# Patient Record
Sex: Male | Born: 1940 | Race: Black or African American | Hispanic: No | Smoking: Former smoker
Health system: Southern US, Community
[De-identification: ages and names within clinical notes are randomized; demographics above are authoritative.]

## PROBLEM LIST (undated history)

## (undated) ENCOUNTER — Emergency Department: Admission: EM | Disposition: A | Discharge status: Home / Self Care | Payer: Medicare PPO

## (undated) DIAGNOSIS — E669 Obesity, unspecified: Secondary | ICD-10-CM

## (undated) DIAGNOSIS — K4090 Unilateral inguinal hernia, without obstruction or gangrene, not specified as recurrent: Secondary | ICD-10-CM

## (undated) DIAGNOSIS — R6882 Decreased libido: Secondary | ICD-10-CM

## (undated) DIAGNOSIS — M545 Low back pain, unspecified: Secondary | ICD-10-CM

## (undated) DIAGNOSIS — K5900 Constipation, unspecified: Secondary | ICD-10-CM

## (undated) DIAGNOSIS — E785 Hyperlipidemia, unspecified: Secondary | ICD-10-CM

## (undated) DIAGNOSIS — N182 Chronic kidney disease, stage 2 (mild): Secondary | ICD-10-CM

## (undated) DIAGNOSIS — R011 Cardiac murmur, unspecified: Secondary | ICD-10-CM

## (undated) DIAGNOSIS — H409 Unspecified glaucoma: Secondary | ICD-10-CM

## (undated) DIAGNOSIS — I350 Nonrheumatic aortic (valve) stenosis: Secondary | ICD-10-CM

## (undated) DIAGNOSIS — K469 Unspecified abdominal hernia without obstruction or gangrene: Secondary | ICD-10-CM

## (undated) DIAGNOSIS — E119 Type 2 diabetes mellitus without complications: Secondary | ICD-10-CM

## (undated) DIAGNOSIS — I10 Essential (primary) hypertension: Secondary | ICD-10-CM

## (undated) DIAGNOSIS — M109 Gout, unspecified: Secondary | ICD-10-CM

## (undated) HISTORY — DX: Low back pain, unspecified: M54.50

## (undated) HISTORY — DX: Type 2 diabetes mellitus without complications: E11.9

## (undated) HISTORY — DX: Low back pain: M54.5

## (undated) HISTORY — DX: Chronic kidney disease, stage 2 (mild): N18.2

## (undated) HISTORY — DX: Nonrheumatic aortic (valve) stenosis: I35.0

## (undated) HISTORY — DX: Decreased libido: R68.82

## (undated) HISTORY — DX: Obesity, unspecified: E66.9

## (undated) HISTORY — DX: Unspecified glaucoma: H40.9

## (undated) HISTORY — PX: HERNIA REPAIR: SHX51

## (undated) HISTORY — DX: Cardiac murmur, unspecified: R01.1

## (undated) HISTORY — DX: Gout, unspecified: M10.9

## (undated) HISTORY — DX: Hyperlipidemia, unspecified: E78.5

## (undated) HISTORY — DX: Unilateral inguinal hernia, without obstruction or gangrene, not specified as recurrent: K40.90

## (undated) HISTORY — DX: Constipation, unspecified: K59.00

## (undated) HISTORY — PX: OTHER SURGICAL HISTORY: SHX169

## (undated) HISTORY — DX: Unspecified abdominal hernia without obstruction or gangrene: K46.9

## (undated) HISTORY — DX: Essential (primary) hypertension: I10

---

## 2006-10-20 ENCOUNTER — Ambulatory Visit: Payer: Self-pay | Admitting: Ophthalmology

## 2006-10-20 ENCOUNTER — Other Ambulatory Visit: Payer: Self-pay

## 2006-10-27 ENCOUNTER — Ambulatory Visit: Payer: Self-pay | Admitting: Ophthalmology

## 2007-03-18 DIAGNOSIS — M109 Gout, unspecified: Secondary | ICD-10-CM

## 2007-03-18 HISTORY — DX: Gout, unspecified: M10.9

## 2007-06-11 ENCOUNTER — Inpatient Hospital Stay: Payer: Self-pay | Admitting: General Surgery

## 2007-06-11 ENCOUNTER — Other Ambulatory Visit: Payer: Self-pay

## 2007-11-25 ENCOUNTER — Ambulatory Visit: Payer: Self-pay | Admitting: Family Medicine

## 2008-08-29 ENCOUNTER — Ambulatory Visit: Payer: Self-pay | Admitting: Urology

## 2008-11-07 ENCOUNTER — Ambulatory Visit: Payer: Self-pay | Admitting: Ophthalmology

## 2008-11-21 ENCOUNTER — Ambulatory Visit: Payer: Self-pay | Admitting: Ophthalmology

## 2009-03-17 HISTORY — PX: EYE SURGERY: SHX253

## 2009-03-17 HISTORY — PX: UMBILICAL HERNIA REPAIR: SHX196

## 2009-08-15 ENCOUNTER — Ambulatory Visit: Payer: Self-pay | Admitting: Family Medicine

## 2010-03-17 HISTORY — PX: COLONOSCOPY: SHX174

## 2010-10-11 ENCOUNTER — Emergency Department: Payer: Self-pay | Admitting: Emergency Medicine

## 2010-12-11 ENCOUNTER — Ambulatory Visit: Payer: Self-pay | Admitting: Gastroenterology

## 2010-12-12 ENCOUNTER — Ambulatory Visit: Payer: Self-pay | Admitting: Gastroenterology

## 2012-03-29 ENCOUNTER — Ambulatory Visit: Payer: Self-pay | Admitting: Family Medicine

## 2012-10-26 ENCOUNTER — Encounter: Payer: Self-pay | Admitting: General Surgery

## 2012-10-26 ENCOUNTER — Ambulatory Visit (INDEPENDENT_AMBULATORY_CARE_PROVIDER_SITE_OTHER): Payer: Medicare PPO | Admitting: General Surgery

## 2012-10-26 VITALS — BP 120/80 | HR 80 | Temp 97.0°F | Resp 16 | Ht 69.0 in | Wt 323.0 lb

## 2012-10-26 DIAGNOSIS — K432 Incisional hernia without obstruction or gangrene: Secondary | ICD-10-CM

## 2012-10-26 DIAGNOSIS — K5900 Constipation, unspecified: Secondary | ICD-10-CM | POA: Insufficient documentation

## 2012-10-26 DIAGNOSIS — K4090 Unilateral inguinal hernia, without obstruction or gangrene, not specified as recurrent: Secondary | ICD-10-CM | POA: Insufficient documentation

## 2012-10-26 NOTE — Patient Instructions (Addendum)
Patient instructed to continue use of Miralax on a daily basis. Patient to have CT scan of abdomin to follow up with Dr. Lemar Livings once this has been done.   Patient has been scheduled for a CT abdomen/pelvis with contrast at [facility] for 10-28-12 at 8 am (arrive 7:45 am). Prep: no solids 4 hours prior but patient may have clear liquids up until exam time, pick up prep kit, and take medication list. Patient verbalizes understanding. Patient will need to hold Janumet day of san and 48 hours after.

## 2012-10-26 NOTE — Progress Notes (Signed)
Patient ID: Jordan [name], male   DOB: [birth year], 72 y.o.   MRN: [id number]  Chief Complaint  Patient presents with  . Other    New Patient evaluation of hernia     HPI Jordan [name] is a 72 y.o. male who presents for an evaluation of a hernia. The patient states he has had problems with constipation for the last month to month and a half and has to use laxatives to make him self have a bowel movement. He states he uses laxatives approximately 2-3 times weekly. He also states he started vomiting this morning. He denies any fever or chills. He states he has had some abdominal pain.    HPI  Past Medical History  Diagnosis Date  . Hypertension   . Diabetes mellitus without complication   . Hyperlipidemia   . Hernia 5784,6962  . Gout 2009    Past Surgical History  Procedure Laterality Date  . Umbilical hernia repair  2011  . Eye surgery  2011    cataract  . Colonoscopy  2012    History reviewed. No pertinent family history.  Social History History  Substance Use Topics  . Smoking status: Former Smoker -- 20 years  . Smokeless tobacco: Not on file  . Alcohol Use: Yes    No Known Allergies  Current Outpatient Prescriptions  Medication Sig Dispense Refill  . allopurinol (ZYLOPRIM) 300 MG tablet Take 300 mg by mouth daily.      . bisacodyl (DULCOLAX) 5 MG EC tablet Take 5 mg by mouth.      . ezetimibe-simvastatin (VYTORIN) 10-40 MG per tablet Take 1 tablet by mouth at bedtime. 2-3 times weekly.      Marland Kitchen glipiZIDE (GLUCOTROL XL) 10 MG 24 hr tablet Take 10 mg by mouth 2 (two) times daily.      . indapamide (LOZOL) 1.25 MG tablet Take 1.25 mg by mouth daily.      . quinapril (ACCUPRIL) 40 MG tablet Take 40 mg by mouth daily.      . SitaGLIPtin-MetFORMIN HCl (JANUMET XR) [phone] MG TB24 Take 1 tablet by mouth daily.      . tadalafil (CIALIS) 5 MG tablet Take 5 mg by mouth daily as needed for erectile dysfunction.       No current facility-administered medications for  this visit.    Review of Systems Review of Systems  Constitutional: Negative.   Respiratory: Negative.   Cardiovascular: Negative.   Gastrointestinal: Positive for vomiting, abdominal pain and constipation.    Blood pressure 120/80, pulse 80, temperature 97 F (36.1 C), temperature source Oral, resp. rate 16, height 5\' 9"  (1.753 m), weight 323 lb (146.512 kg).  Physical Exam Physical Exam  Constitutional: He is oriented to person, place, and time. He appears well-developed and well-nourished.  Neck: Trachea normal. No thyromegaly present.  Cardiovascular: Normal rate and regular rhythm.   Murmur heard.  Systolic murmur is present with a grade of 2/6  Pulmonary/Chest: Effort normal and breath sounds normal.  Abdominal: Soft. Bowel sounds are normal. There is no tenderness. A hernia is present. Hernia confirmed positive in the ventral area.    Neurological: He is alert and oriented to person, place, and time.  Skin: Skin is warm and dry.    Data Reviewed None  Assessment    Constipation. Recurrent incisional hernia. This hernia is reducible and exam does not suggest any bowel obstruction.  Pt has had prior repairs of incisional hernia with mesh -at least twice.  Plan    Recommend continued use of Miralax. CT scan to assess the hernia and can follow up with Dr. Lemar Livings after scan.     Patient has been scheduled for a CT abdomen/pelvis with contrast at [facility] for 10-28-12 at 8 am (arrive 7:45 am). Prep: no solids 4 hours prior but patient may have clear liquids up until exam time, pick up prep kit, and take medication list. Patient verbalizes understanding. Patient will need to hold Janumet day of san and 48 hours after.    Jordan [name] G 10/26/2012, 1:23 PM

## 2012-10-28 ENCOUNTER — Ambulatory Visit: Payer: Self-pay | Admitting: General Surgery

## 2012-11-02 ENCOUNTER — Encounter: Payer: Self-pay | Admitting: General Surgery

## 2012-11-02 ENCOUNTER — Ambulatory Visit (INDEPENDENT_AMBULATORY_CARE_PROVIDER_SITE_OTHER): Payer: Medicare PPO | Admitting: General Surgery

## 2012-11-02 VITALS — BP 118/74 | HR 86 | Resp 18 | Ht 69.0 in | Wt 326.0 lb

## 2012-11-02 DIAGNOSIS — K432 Incisional hernia without obstruction or gangrene: Secondary | ICD-10-CM

## 2012-11-02 NOTE — Progress Notes (Signed)
Patient ID: Jordan [name], male   DOB: [birth year], 72 y.o.   MRN: [id number]  Chief Complaint  Patient presents with  . Follow-up    CT scan    HPI Jordan [name] is a 72 y.o. male.  Patient here today to review CT scan results. No further vomiting and constipation is better taking the Miralax daily. Wearing his abdominal binder. States he noticed the abdominal bulge about 5-6 months ago and hasn't changed in size. The patient is no longer experiencing any abdominal discomfort, nausea or vomiting. He reports he really easy elimination with the daily use of MiraLax. HPI  Past Medical History  Diagnosis Date  . Hypertension   . Diabetes mellitus without complication   . Hyperlipidemia   . Hernia 4098,1191  . Gout 2009    Past Surgical History  Procedure Laterality Date  . Umbilical hernia repair  2011  . Eye surgery  2011    cataract  . Colonoscopy  2012    No family history on file.  Social History History  Substance Use Topics  . Smoking status: Former Smoker -- 20 years  . Smokeless tobacco: Not on file  . Alcohol Use: Yes    No Known Allergies  Current Outpatient Prescriptions  Medication Sig Dispense Refill  . allopurinol (ZYLOPRIM) 300 MG tablet Take 300 mg by mouth daily.      . bisacodyl (DULCOLAX) 5 MG EC tablet Take 5 mg by mouth daily as needed.       . ezetimibe-simvastatin (VYTORIN) 10-40 MG per tablet Take 1 tablet by mouth at bedtime. 2-3 times weekly.      Marland Kitchen glipiZIDE (GLUCOTROL XL) 10 MG 24 hr tablet Take 10 mg by mouth 2 (two) times daily.      . indapamide (LOZOL) 1.25 MG tablet Take 1.25 mg by mouth daily.      . polyethylene glycol (MIRALAX / GLYCOLAX) packet Take 17 g by mouth daily.      . quinapril (ACCUPRIL) 40 MG tablet Take 40 mg by mouth daily.      . SitaGLIPtin-MetFORMIN HCl (JANUMET XR) [phone] MG TB24 Take 1 tablet by mouth daily.      . tadalafil (CIALIS) 5 MG tablet Take 5 mg by mouth daily as needed for erectile dysfunction.        No current facility-administered medications for this visit.    Review of Systems Review of Systems  Constitutional: Negative.   Respiratory: Negative.   Cardiovascular: Negative.     Blood pressure 118/74, pulse 86, resp. rate 18, height 5\' 9"  (1.753 m), weight 326 lb (147.873 kg).  Physical Exam Physical Exam  Constitutional: He is oriented to person, place, and time. He appears well-developed and well-nourished.  Cardiovascular: Normal rate and regular rhythm.   Pulmonary/Chest: Effort normal and breath sounds normal.  Abdominal: Soft. There is no tenderness. A hernia is present.  Lymphadenopathy:    He has no cervical adenopathy.  Neurological: He is alert and oriented to person, place, and time.  Skin: Skin is warm and dry.  5-6 cm defect in upper mid abdomen, partially reducible. Nontender. Normal bowel sounds.  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 A ventral hernia repair is identified. There was a suggestion of incarceration of one small bowel loop with slight thickening and surrounding inflammatory changes. Findings suggestive of constipation were seen.  Review of the patient's multiple previous surgical excursions was completed. The patient's first operative  intervention was in 1995 when he presented with an incarcerated ventral hernia. This was repaired primarily.  In 1999 the patient had a recurrent ventral hernia and placement of a large, oval Kugel patch and a preperitoneal pocket.  In 2002 the patient had a recurrence of his ventral hernia and a laparoscopic repair making use of an 18 x 24 cm Gore-Tex dual mesh was completed. The peritoneal sac was not resected at that time. Transvaginal fixation sutures were utilized.  In 2009 the patient presented with an incarcerated ventral hernia. He underwent an exploratory laparotomy, lysis of adhesions and placement of a 15 x 15 Proceed intraperitoneal mesh  was placed. This was anchored with through and through sutures of 0 Prolene. At that time the superior edge of the previously placed Gore-Tex mesh was found to have pulled free from its anchoring points and this was anchored to the proceed mesh with a transfacial sutures.  Assessment    Re\re recurrent ventral hernia, presently asymptomatic.  Morbid obesity.  Non-insulin-dependent diabetes mellitus.  Essential hypertension.     Plan    This patient has had 2 emergency operations for incarceration of his hernia. He has had 4 previous hernia repairs. I think the patient would be best managed by referral to a tertiary center for a component separation.  In spite of the CT suggesting involvement of the small bowel, the patient is presently asymptomatic, tolerating his diet well and having regular bowel movements. Urgent intervention is not necessary.        [name] 11/03/2012, 8:59 PM

## 2012-11-02 NOTE — Patient Instructions (Addendum)
Continue abdominal binder as needed for comfort Work on weight loss Ok to use miralax daily Call for increased pain, hernia enlarges or vomiting

## 2012-11-03 ENCOUNTER — Encounter: Payer: Self-pay | Admitting: General Surgery

## 2012-11-10 ENCOUNTER — Ambulatory Visit (INDEPENDENT_AMBULATORY_CARE_PROVIDER_SITE_OTHER): Payer: Medicare PPO | Admitting: General Surgery

## 2012-11-10 ENCOUNTER — Ambulatory Visit: Payer: Self-pay | Admitting: General Surgery

## 2012-11-10 ENCOUNTER — Encounter: Payer: Self-pay | Admitting: General Surgery

## 2012-11-10 VITALS — BP 138/70 | HR 68 | Resp 18 | Ht 69.0 in | Wt 324.0 lb

## 2012-11-10 DIAGNOSIS — K432 Incisional hernia without obstruction or gangrene: Secondary | ICD-10-CM

## 2012-11-10 DIAGNOSIS — R1110 Vomiting, unspecified: Secondary | ICD-10-CM

## 2012-11-10 MED ORDER — METOCLOPRAMIDE HCL 5 MG PO TABS: 5.0000 mg | ORAL_TABLET | Freq: Three times a day (TID) | ORAL | Status: DC

## 2012-11-10 NOTE — Progress Notes (Signed)
Patient ID: Jordan [name], male   DOB: [birth year], 72 y.o.   MRN: [id number]  No chief complaint on file.   HPI Jordan [name] is a 72 y.o. male.  Patient here today for abdominal evaluation.  States he has vomited for 2 days and been constipated since Sunday.  States he did have a BM today that was soft normal. Has been having hiccups since Sunday. He thinks it may be related to the hernia. Has been using miralax until he got sick.  The patient reports he said vomiting without nausea.He denies any abdominal pain. In particular, no tenderness around his hernia.  He has been able to eat some, reporting 3 hard boiled legs and it lasted milk today without any difficulty. He has been making use of his diabetic medications and blood pressure medications on his regular schedule. HPI  Past Medical History  Diagnosis Date  . Hypertension   . Diabetes mellitus without complication   . Hyperlipidemia   . Hernia 4098,1191  . Gout 2009    Past Surgical History  Procedure Laterality Date  . Umbilical hernia repair  2011  . Eye surgery  2011    cataract  . Colonoscopy  2012    No family history on file.  Social History History  Substance Use Topics  . Smoking status: Former Smoker -- 20 years  . Smokeless tobacco: Not on file  . Alcohol Use: Yes    No Known Allergies  Current Outpatient Prescriptions  Medication Sig Dispense Refill  . allopurinol (ZYLOPRIM) 300 MG tablet Take 300 mg by mouth daily.      . bisacodyl (DULCOLAX) 5 MG EC tablet Take 5 mg by mouth daily as needed.       . ezetimibe-simvastatin (VYTORIN) 10-40 MG per tablet Take 1 tablet by mouth at bedtime. 2-3 times weekly.      Marland Kitchen glipiZIDE (GLUCOTROL XL) 10 MG 24 hr tablet Take 10 mg by mouth 2 (two) times daily.      . indapamide (LOZOL) 1.25 MG tablet Take 1.25 mg by mouth daily.      . metoCLOPramide (REGLAN) 5 MG tablet Take 1 tablet (5 mg total) by mouth 4 (four) times daily -  with meals and at bedtime.   120 tablet  3  . polyethylene glycol (MIRALAX / GLYCOLAX) packet Take 17 g by mouth daily.      . quinapril (ACCUPRIL) 40 MG tablet Take 40 mg by mouth daily.      . SitaGLIPtin-MetFORMIN HCl (JANUMET XR) [phone] MG TB24 Take 1 tablet by mouth daily.      . tadalafil (CIALIS) 5 MG tablet Take 5 mg by mouth daily as needed for erectile dysfunction.       No current facility-administered medications for this visit.    Review of Systems Review of Systems  Respiratory: Negative.   Gastrointestinal: Positive for vomiting and constipation. Negative for abdominal pain.    Blood pressure 138/70, pulse 68, resp. rate 18, height 5\' 9"  (1.753 m), weight 324 lb (146.965 kg).  Physical Exam Physical Exam  Constitutional: He is oriented to person, place, and time. He appears well-developed and well-nourished.  Cardiovascular: Normal rate and regular rhythm.   Murmur heard. Murmur unchanged  Pulmonary/Chest: Effort normal and breath sounds normal.  Abdominal: Soft. Bowel sounds are normal. There is no tenderness. A hernia is present.  Neurological: He is alert and oriented to person, place, and time.  Skin: Skin is warm and dry.  The ventral hernia is unchanged from past exams. No tenderness. Totally reducible.  Data Reviewed Plain films of the abdomen today were obtained. On my review there is mild gastric air fluid level. No small bowel dilatation. Normal colonic gas pattern. No evidence of obstruction.  Assessment    Possible gastroparesis based on his long-standing history of diabetes. No evidence of obstruction at this time.     Plan    The patient was placed on Reglan 5 mg 30 minutes a.c. And q.h.s.  [facility].  If a timely appointment is available with Virgilio Frees, MD at [facility] in [place], this would be my first choice. If not, we'll contact [facility] for assessment. The patient has had 4 previous  hernia repairs, to complete it is emergency's. Considering his BMI and diabetes, it would be ideal for his fifth repair to be his final repair.     Patient to call the office with a progress report this Friday, 11-12-12.   [name] 11/10/2012, 10:03 PM

## 2012-11-10 NOTE — Patient Instructions (Addendum)
The patient is aware to call back for any questions or concerns.  Abdominal x ray today --no blockage from hernia  Reglan prescription -- Take four times a day Call back on Friday with progress report on how he thinks the medication is working

## 2012-11-11 ENCOUNTER — Telehealth: Payer: Self-pay | Admitting: *Deleted

## 2012-11-11 NOTE — Telephone Encounter (Signed)
Patient has been scheduled for an appointment with Dr. Tawanna Cooler Heniford at [facility] in [place] for 11-29-12 at 8:30 am (arrive 8:15 am). This patient has been notified of date, time, and instructions. The physician's information and directions have been mailed to the patient. Dr. Cresenciano Genre office will also be mailing patient new patient paperwork to complete.   Records will be forwarded to Attn: Carla at [phone] (Office Phone: [phone]).

## 2012-11-11 NOTE — Telephone Encounter (Signed)
Message copied by Nicholes Mango on Thu Nov 11, 2012  1:30 PM ------      Message from: [place], [place] W      Created: Wed Nov 10, 2012 10:07 PM       See if we can get a timely appt w/ Todd Henniford (associates)  at [facility] re: recurrent ventral hernia.  ------

## 2012-11-12 ENCOUNTER — Encounter: Payer: Self-pay | Admitting: General Surgery

## 2012-11-18 ENCOUNTER — Encounter: Payer: Self-pay | Admitting: General Surgery

## 2012-11-19 ENCOUNTER — Encounter: Payer: Self-pay | Admitting: General Surgery

## 2012-12-01 ENCOUNTER — Encounter: Payer: Self-pay | Admitting: *Deleted

## 2012-12-02 ENCOUNTER — Ambulatory Visit (INDEPENDENT_AMBULATORY_CARE_PROVIDER_SITE_OTHER): Payer: Medicare PPO | Admitting: Cardiovascular Disease

## 2012-12-02 ENCOUNTER — Encounter: Payer: Self-pay | Admitting: Cardiovascular Disease

## 2012-12-02 VITALS — BP 170/89 | HR 87 | Ht 69.0 in | Wt 321.2 lb

## 2012-12-02 DIAGNOSIS — Z01810 Encounter for preprocedural cardiovascular examination: Secondary | ICD-10-CM

## 2012-12-02 DIAGNOSIS — I10 Essential (primary) hypertension: Secondary | ICD-10-CM

## 2012-12-02 DIAGNOSIS — R011 Cardiac murmur, unspecified: Secondary | ICD-10-CM

## 2012-12-02 DIAGNOSIS — I350 Nonrheumatic aortic (valve) stenosis: Secondary | ICD-10-CM | POA: Insufficient documentation

## 2012-12-02 NOTE — Patient Instructions (Addendum)
Your physician has requested that you have an echocardiogram. Echocardiography is a painless test that uses sound waves to create images of your heart. It provides your doctor with information about the size and shape of your heart and how well your heart's chambers and valves are working. This procedure takes approximately one hour. There are no restrictions for this procedure.  Follow up in 6 months.  

## 2012-12-02 NOTE — Assessment & Plan Note (Signed)
He has a cardiac murmur suggestive of aortic stenosis which does not seem to be severe by physical exam. This will be evaluated with an echocardiogram.

## 2012-12-02 NOTE — Assessment & Plan Note (Signed)
The patient has no previous cardiac history except for diagnosis cardiac murmur. His functional capacity is good without any anginal symptoms. Baseline ECG is normal except for a few PVCs. Examination is not suggestive of severe aortic stenosis. Thus, most likely he will be considered an overall low risk for cardiovascular complications. I will obtain an echocardiogram before the planned surgery before giving final recommendations.

## 2012-12-02 NOTE — Assessment & Plan Note (Signed)
Blood pressure is elevated today but was normal recently when he saw Dr. Ignacia Felling. Continue to monitor.

## 2012-12-02 NOTE — Progress Notes (Signed)
Primary care physician: Dr. Thana Ates Surgeon: Dr. Marylene Land at [facility] in [place]  HPI  This is a pleasant 72 year old man who is referred by Dr. Thana Ates for preoperative cardiovascular evaluation before anticipated abdominal hernia surgery. The patient was noted to have a cardiac murmur. He is not aware of any previous cardiac history. His father had rheumatic heart disease. He has chronic medical conditions that include type 2 diabetes, hypertension, hyperlipidemia and obesity. He walks about 2 miles for exercise 5 times a week without any reported chest pain, dyspnea or palpitations. He is a previous smoker. He denies any dizziness, syncope or presyncope.   No Known Allergies   Current Outpatient Prescriptions on File Prior to Visit  Medication Sig Dispense Refill  . allopurinol (ZYLOPRIM) 300 MG tablet Take 300 mg by mouth daily.      . bisacodyl (DULCOLAX) 5 MG EC tablet Take 5 mg by mouth daily as needed.       . ezetimibe-simvastatin (VYTORIN) 10-40 MG per tablet Take 1 tablet by mouth at bedtime. 2-3 times weekly.      Marland Kitchen glipiZIDE (GLUCOTROL XL) 10 MG 24 hr tablet Take 10 mg by mouth 2 (two) times daily.      . indapamide (LOZOL) 1.25 MG tablet Take 1.25 mg by mouth daily.      . metoCLOPramide (REGLAN) 5 MG tablet Take 1 tablet (5 mg total) by mouth 4 (four) times daily -  with meals and at bedtime.  120 tablet  3  . polyethylene glycol (MIRALAX / GLYCOLAX) packet Take 17 g by mouth daily.      . quinapril (ACCUPRIL) 40 MG tablet Take 40 mg by mouth daily.      . SitaGLIPtin-MetFORMIN HCl (JANUMET XR) [phone] MG TB24 Take 1 tablet by mouth daily.      . tadalafil (CIALIS) 5 MG tablet Take 5 mg by mouth daily as needed for erectile dysfunction.       No current facility-administered medications on file prior to visit.     Past Medical History  Diagnosis Date  . Hypertension   . Diabetes mellitus without complication   . Hyperlipidemia   . Hernia 1610,9604  . Gout 2009  .  Obesity   . Decreased libido   . Lumbago   . Inguinal hernia without mention of obstruction or gangrene, unilateral or unspecified, (not specified as recurrent)   . Unspecified constipation   . Chronic kidney disease, stage II (mild)   . Heart murmur      Past Surgical History  Procedure Laterality Date  . Umbilical hernia repair  2011  . Eye surgery  2011    cataract  . Colonoscopy  2012  . Cataract surgery       Family History  Problem Relation Age of Onset  . Heart disease Father   . Hypertension Father      History   Social History  . Marital Status: Widowed    Spouse Name: N/A    Number of Children: N/A  . Years of Education: N/A   Occupational History  . Not on file.   Social History Main Topics  . Smoking status: Former Smoker -- 1.00 packs/day for 10 years    Types: Cigarettes  . Smokeless tobacco: Not on file  . Alcohol Use: Yes     Comment: social  . Drug Use: No  . Sexual Activity: Not on file   Other Topics Concern  . Not on file   Social History Narrative  .  No narrative on file     ROS A 10 point review of system was performed. It's negative other than as mentioned in the history of present illness.  PHYSICAL EXAM   BP 170/89  Pulse 87  Ht 5\' 9"  (1.753 m)  Wt 321 lb 4 oz (145.718 kg)  BMI 47.42 kg/m2 Constitutional: He is oriented to person, place, and time. He appears well-developed and well-nourished. No distress.  HENT: No nasal discharge.  Head: Normocephalic and atraumatic.  Eyes: Pupils are equal and round. Right eye exhibits no discharge. Left eye exhibits no discharge.  Neck: Normal range of motion. Neck supple. No JVD present. No thyromegaly present.  Cardiovascular: Normal rate, regular rhythm, normal heart sounds. Exam reveals no gallop and no friction rub. There is a 2/6 systolic crescendo decrescendo murmur which is mid peaking with preserved S2. The murmur does not change with Valsalva maneuver. Pulmonary/Chest:  Effort normal and breath sounds normal. No stridor. No respiratory distress. He has no wheezes. He has no rales. He exhibits no tenderness.  Abdominal: Soft. Bowel sounds are normal. He exhibits no distension. There is no tenderness. There is no rebound and no guarding.  Musculoskeletal: Normal range of motion. He exhibits no edema and no tenderness.  Neurological: He is alert and oriented to person, place, and time. Coordination normal.  Skin: Skin is warm and dry. No rash noted. He is not diaphoretic. No erythema. No pallor.  Psychiatric: He has a normal mood and affect. His behavior is normal. Judgment and thought content normal.       ZOX:WRUEA  Rhythm  - frequent ectopic ventricular beat s  # VECs = 2 BORDERLINE RHYTHM     ASSESSMENT AND PLAN

## 2012-12-07 ENCOUNTER — Other Ambulatory Visit: Payer: Self-pay

## 2012-12-07 ENCOUNTER — Other Ambulatory Visit (INDEPENDENT_AMBULATORY_CARE_PROVIDER_SITE_OTHER): Payer: Medicare PPO

## 2012-12-07 DIAGNOSIS — R011 Cardiac murmur, unspecified: Secondary | ICD-10-CM

## 2012-12-07 DIAGNOSIS — Z01810 Encounter for preprocedural cardiovascular examination: Secondary | ICD-10-CM

## 2013-02-02 ENCOUNTER — Ambulatory Visit: Payer: Medicare PPO | Admitting: General Surgery

## 2013-03-23 ENCOUNTER — Encounter: Payer: Self-pay | Admitting: *Deleted

## 2014-04-04 ENCOUNTER — Telehealth: Payer: Self-pay | Admitting: *Deleted

## 2014-04-04 NOTE — Telephone Encounter (Signed)
-----   Message from Robert Bellow, MD sent at 04/03/2014  8:16 AM EST ----- Patient underwent recurrent hernia repair at [facility] in January 2015 w/ Dr. Roxanne Mins.  Just check to see how he is doing and if he has had any more problems w/ nausea/ vomiting/ constipation.  Thanks.

## 2014-04-04 NOTE — Telephone Encounter (Signed)
I talked with the patient and he said he had surgery and the "knot" was gone. He also states the he seems to be "getting back to normal" as far as his symptoms. Appreciates your phone call and checking on him.  Aware to call for new concerns or issues.

## 2014-09-21 ENCOUNTER — Encounter: Payer: Self-pay | Admitting: Family Medicine

## 2014-09-21 ENCOUNTER — Ambulatory Visit (INDEPENDENT_AMBULATORY_CARE_PROVIDER_SITE_OTHER): Payer: Medicare PPO | Admitting: Family Medicine

## 2014-09-21 VITALS — BP 148/76 | HR 100 | Temp 98.2°F | Resp 18 | Ht 72.0 in | Wt 314.8 lb

## 2014-09-21 DIAGNOSIS — Z0000 Encounter for general adult medical examination without abnormal findings: Secondary | ICD-10-CM | POA: Diagnosis not present

## 2014-09-21 DIAGNOSIS — L129 Pemphigoid, unspecified: Secondary | ICD-10-CM | POA: Diagnosis not present

## 2014-09-21 DIAGNOSIS — Z125 Encounter for screening for malignant neoplasm of prostate: Secondary | ICD-10-CM | POA: Diagnosis not present

## 2014-09-21 DIAGNOSIS — N138 Other obstructive and reflux uropathy: Secondary | ICD-10-CM

## 2014-09-21 DIAGNOSIS — R040 Epistaxis: Secondary | ICD-10-CM | POA: Diagnosis not present

## 2014-09-21 DIAGNOSIS — N401 Enlarged prostate with lower urinary tract symptoms: Secondary | ICD-10-CM | POA: Diagnosis not present

## 2014-09-21 NOTE — Progress Notes (Signed)
Name: HAJIME ASFAW   MRN: [id number]    DOB: 03-20-1940   Date:09/21/2014       Progress Note  Subjective  Chief Complaint  Chief Complaint  Patient presents with  . Annual Exam    HPI  74 year old male presents for annual H&P a sign problems are stable  Past Medical History  Diagnosis Date  . Diabetes mellitus without complication   . Hyperlipidemia   . Hernia 1950,9326  . Gout 2009  . Obesity   . Decreased libido   . Lumbago   . Inguinal hernia without mention of obstruction or gangrene, unilateral or unspecified, (not specified as recurrent)   . Unspecified constipation   . Chronic kidney disease, stage II (mild)   . Heart murmur   . Hypertension     History  Substance Use Topics  . Smoking status: Former Smoker -- 1.00 packs/day for 10 years    Types: Cigarettes  . Smokeless tobacco: Not on file  . Alcohol Use: 0.0 oz/week    0 Standard drinks or equivalent per week     Comment: social     Current outpatient prescriptions:  .  allopurinol (ZYLOPRIM) 300 MG tablet, Take 300 mg by mouth daily., Disp: , Rfl:  .  aspirin 81 MG tablet, Take 162 mg by mouth daily., Disp: , Rfl:  .  bisacodyl (DULCOLAX) 5 MG EC tablet, Take 5 mg by mouth daily as needed. , Disp: , Rfl:  .  ezetimibe-simvastatin (VYTORIN) 10-40 MG per tablet, Take 1 tablet by mouth at bedtime. 2-3 times weekly., Disp: , Rfl:  .  glipiZIDE (GLUCOTROL XL) 10 MG 24 hr tablet, Take 10 mg by mouth 2 (two) times daily., Disp: , Rfl:  .  indapamide (LOZOL) 1.25 MG tablet, Take 1.25 mg by mouth daily., Disp: , Rfl:  .  metoCLOPramide (REGLAN) 5 MG tablet, Take 1 tablet (5 mg total) by mouth 4 (four) times daily -  with meals and at bedtime., Disp: 120 tablet, Rfl: 3 .  polyethylene glycol (MIRALAX / GLYCOLAX) packet, Take 17 g by mouth daily., Disp: , Rfl:  .  quinapril (ACCUPRIL) 40 MG tablet, Take 40 mg by mouth daily., Disp: , Rfl:  .  SitaGLIPtin-MetFORMIN HCl (JANUMET XR) [phone] MG TB24, Take 1  tablet by mouth daily., Disp: , Rfl:  .  tadalafil (CIALIS) 5 MG tablet, Take 5 mg by mouth daily as needed for erectile dysfunction., Disp: , Rfl:   No Known Allergies  Review of Systems  Constitutional: Negative for fever, chills and weight loss.  HENT: Negative for congestion, hearing loss, sore throat and tinnitus.   Eyes: Negative for blurred vision, double vision and redness.  Respiratory: Negative for cough, hemoptysis and shortness of breath.   Cardiovascular: Negative for chest pain, palpitations, orthopnea, claudication and leg swelling.  Gastrointestinal: Negative for heartburn, nausea, vomiting, diarrhea, constipation and blood in stool.  Genitourinary: Negative for dysuria, urgency, frequency and hematuria.  Musculoskeletal: Negative for myalgias, back pain, joint pain, falls and neck pain.  Skin: Negative for itching.  Neurological: Negative for dizziness, tingling, tremors, focal weakness, seizures, loss of consciousness, weakness and headaches.  Endo/Heme/Allergies: Does not bruise/bleed easily.  Psychiatric/Behavioral: Negative for depression and substance abuse. The patient is not nervous/anxious and does not have insomnia.      Objective  Filed Vitals:   09/21/14 0953  BP: 148/76  Pulse: 100  Temp: 98.2 F (36.8 C)  TempSrc: Oral  Resp: 18  Height: 6' (1.829 m)  Weight: 314 lb 12.8 oz (142.792 kg)  SpO2: 96%     Physical Exam  Constitutional: He is oriented to person, place, and time and well-developed, well-nourished, and in no distress.  Morbidly obese  HENT:  Head: Normocephalic.  Eyes: EOM are normal. Pupils are equal, round, and reactive to light.  Neck: Normal range of motion. Neck supple. No thyromegaly present.  Cardiovascular: Normal rate, regular rhythm and normal heart sounds.   No murmur heard. Pulmonary/Chest: Effort normal and breath sounds normal. No respiratory distress. He has no wheezes.  Abdominal: Soft. Bowel sounds are normal.   Ventral hernia with prior repair  Musculoskeletal: Normal range of motion. He exhibits no edema.  Lymphadenopathy:    He has no cervical adenopathy.  Neurological: He is alert and oriented to person, place, and time. No cranial nerve deficit. Gait normal. Coordination normal.  Skin: Skin is warm and dry. No rash noted.  Psychiatric: Affect and judgment normal.      Assessment & Plan  1. Annual physical exam   2. Epistaxis IMPROVING  3. Pemphigoid  - Ambulatory referral to Dermatology  4. Prostate cancer screening  - PSA

## 2014-09-21 NOTE — Progress Notes (Signed)
Name: Jordan [name]   MRN: [id number]    DOB: [birth year]   Date:09/21/2014       Progress Note  Subjective  Chief Complaint  Chief Complaint  Patient presents with  . Annual Exam    HPI   74 year old male presented for annual H&P. He has a number of other issues as noted below.   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 There was no antecedent trauma. There's been no fever or chills myalgias associated.    epistaxis    patient started recurrent bleeding of the left nostril. No history of any easy bruising or hematuria or melena or hematochezia.     Past Medical History  Diagnosis Date  . Diabetes mellitus without complication   . Hyperlipidemia   . Hernia 6578,4696  . Gout 2009  . Obesity   . Decreased libido   . Lumbago   . Inguinal hernia without mention of obstruction or gangrene, unilateral or unspecified, (not specified as recurrent)   . Unspecified constipation   . Chronic kidney disease, stage II (mild)   . Heart murmur   . Hypertension     History  Substance Use Topics  . Smoking status: Former Smoker -- 1.00 packs/day for 10 years    Types: Cigarettes  . Smokeless tobacco: Not on file  . Alcohol Use: 0.0 oz/week    0 Standard drinks or equivalent per week     Comment: social     Current outpatient prescriptions:  .  allopurinol (ZYLOPRIM) 300 MG tablet, Take 300 mg by mouth daily., Disp: , Rfl:  .  aspirin 81 MG tablet, Take 162 mg by mouth daily., Disp: , Rfl:  .  bisacodyl (DULCOLAX) 5 MG EC tablet, Take 5 mg by mouth daily as needed. , Disp: , Rfl:  .  ezetimibe-simvastatin (VYTORIN) 10-40 MG per tablet, Take 1 tablet by mouth at bedtime. 2-3 times weekly., Disp: , Rfl:  .  glipiZIDE (GLUCOTROL XL) 10 MG 24 hr tablet, Take 10 mg by mouth 2 (two) times daily., Disp: , Rfl:  .  indapamide (LOZOL) 1.25 MG tablet, Take 1.25 mg by mouth daily.,  Disp: , Rfl:  .  metoCLOPramide (REGLAN) 5 MG tablet, Take 1 tablet (5 mg total) by mouth 4 (four) times daily -  with meals and at bedtime., Disp: 120 tablet, Rfl: 3 .  polyethylene glycol (MIRALAX / GLYCOLAX) packet, Take 17 g by mouth daily., Disp: , Rfl:  .  quinapril (ACCUPRIL) 40 MG tablet, Take 40 mg by mouth daily., Disp: , Rfl:  .  SitaGLIPtin-MetFORMIN HCl (JANUMET XR) [phone] MG TB24, Take 1 tablet by mouth daily., Disp: , Rfl:  .  tadalafil (CIALIS) 5 MG tablet, Take 5 mg by mouth daily as needed for erectile dysfunction., Disp: , Rfl:   No Known Allergies  Review of Systems  Constitutional: Negative for fever, chills and weight loss.  HENT: Negative for congestion, hearing loss, sore throat and tinnitus.        Epistaxis on the left  Eyes: Negative for blurred vision, double vision and redness.  Respiratory: Negative for cough, hemoptysis and shortness of breath.   Cardiovascular: Negative for chest pain, palpitations, orthopnea, claudication and leg swelling.  Gastrointestinal: Negative for heartburn, nausea, vomiting, diarrhea, constipation and blood in stool.  Genitourinary: Negative for dysuria, urgency, frequency and hematuria.  Musculoskeletal: Negative for myalgias, back pain, joint  pain, falls and neck pain.  Skin: Positive for rash. Negative for itching.  Neurological: Negative for dizziness, tingling, tremors, focal weakness, seizures, loss of consciousness, weakness and headaches.  Endo/Heme/Allergies: Does not bruise/bleed easily.  Psychiatric/Behavioral: Negative for depression and substance abuse. The patient is not nervous/anxious and does not have insomnia.      Objective  Filed Vitals:   09/21/14 0953  BP: 148/76  Pulse: 100  Temp: 98.2 F (36.8 C)  TempSrc: Oral  Resp: 18  Height: 6' (1.829 m)  Weight: 314 lb 12.8 oz (142.792 kg)  SpO2: 96%     Physical Exam  Constitutional: He is oriented to person, place, and time and well-developed,  well-nourished, and in no distress.  Morbidly obese  HENT:  Head: Normocephalic.  There is a small area of bleeding on the septal aspect of the left nostril  Eyes: EOM are normal. Pupils are equal, round, and reactive to light.  Neck: Normal range of motion. Neck supple. No thyromegaly present.  Cardiovascular: Normal rate, regular rhythm and normal heart sounds.   No murmur heard. Pulmonary/Chest: Effort normal and breath sounds normal. No respiratory distress. He has no wheezes.  Abdominal: Soft. Bowel sounds are normal.  Genitourinary: Rectum normal, prostate normal and penis normal. Guaiac negative stool.  Musculoskeletal: Normal range of motion. He exhibits no edema.  Lymphadenopathy:    He has no cervical adenopathy.  Neurological: He is alert and oriented to person, place, and time. No cranial nerve deficit. Gait normal. Coordination normal.  Skin: Skin is warm and dry. Rash noted.  Psychiatric: Affect and judgment normal.  There are several oval vesicular areas in various stages of healing on the right lower extremity consistent with pemphigus or pemphigoid      Assessment & Plan  1. Annual physical exam   2. Epistaxis improving  3. Pemphigoid  - Ambulatory referral to Dermatology  4. Prostate cancer screening  - PSA

## 2014-09-21 NOTE — Patient Instructions (Addendum)

## 2014-09-22 ENCOUNTER — Encounter: Payer: Self-pay | Admitting: Family Medicine

## 2014-09-22 LAB — PSA: Prostate Specific Ag, Serum: 2.9 ng/mL (ref 0.0–4.0)

## 2014-10-26 DIAGNOSIS — N401 Enlarged prostate with lower urinary tract symptoms: Secondary | ICD-10-CM

## 2014-10-26 DIAGNOSIS — N138 Other obstructive and reflux uropathy: Secondary | ICD-10-CM | POA: Insufficient documentation

## 2014-10-26 NOTE — Addendum Note (Signed)
Addended by: Ashok Norris on: 10/26/2014 10:22 AM   Modules accepted: Miquel Dunn

## 2014-11-07 ENCOUNTER — Encounter: Payer: Self-pay | Admitting: Family Medicine

## 2014-11-07 ENCOUNTER — Ambulatory Visit (INDEPENDENT_AMBULATORY_CARE_PROVIDER_SITE_OTHER): Payer: Medicare PPO | Admitting: Family Medicine

## 2014-11-07 VITALS — BP 110/60 | HR 100 | Temp 98.6°F | Resp 18 | Ht 72.0 in | Wt 322.3 lb

## 2014-11-07 DIAGNOSIS — M109 Gout, unspecified: Secondary | ICD-10-CM | POA: Insufficient documentation

## 2014-11-07 DIAGNOSIS — I10 Essential (primary) hypertension: Secondary | ICD-10-CM | POA: Insufficient documentation

## 2014-11-07 DIAGNOSIS — E119 Type 2 diabetes mellitus without complications: Secondary | ICD-10-CM | POA: Diagnosis not present

## 2014-11-07 DIAGNOSIS — Z1211 Encounter for screening for malignant neoplasm of colon: Secondary | ICD-10-CM | POA: Diagnosis not present

## 2014-11-07 DIAGNOSIS — E669 Obesity, unspecified: Secondary | ICD-10-CM

## 2014-11-07 DIAGNOSIS — E785 Hyperlipidemia, unspecified: Secondary | ICD-10-CM | POA: Diagnosis not present

## 2014-11-07 DIAGNOSIS — N521 Erectile dysfunction due to diseases classified elsewhere: Secondary | ICD-10-CM

## 2014-11-07 DIAGNOSIS — E1140 Type 2 diabetes mellitus with diabetic neuropathy, unspecified: Secondary | ICD-10-CM | POA: Insufficient documentation

## 2014-11-07 LAB — GLUCOSE, POCT (MANUAL RESULT ENTRY): POC GLUCOSE: 146 mg/dL — AB (ref 70–99)

## 2014-11-07 LAB — POCT GLYCOSYLATED HEMOGLOBIN (HGB A1C): Hemoglobin A1C: 6.3

## 2014-11-07 NOTE — Progress Notes (Signed)
Name: Jordan [name]   MRN: [id number]    DOB: Dec 20, 1940   Date:11/07/2014       Progress Note  Subjective  Chief Complaint  Chief Complaint  Patient presents with  . Hypertension    4 month follow up  . Diabetes  . Hyperlipidemia    HPI  Diabetes 2 well-controlled  Patient currently on a regimen of glipizide 10 mg tablet once daily along with Januvia 1 twice a day. No hypoglycemic episodes. He has been informed check his blood sugars at home but has not done so with consistency. Compliance with diet is questionable and he is not regularly exercising. Hypertension  Patient currently on a regimen of quinapril 40 mg daily. Denies any chest pain palpitations orthopnea PND but does have some lower extremity swelling. There is no end organ disease.  Hyperlipidemia  Patient currently on a regimen consisting of atorvastatin 40 mg daily at bedtime which he follows consistently he says. No problems myalgias. He's had this over 5 years. Risk factors include hyperlipidemia diabetes hypertension obesity sedentary lifestyle.  Gout  No recent flare of gout. Remains on allopurinol 300 mg by mouth daily. No side effects rash or problems associated with this usage.        Past Medical History  Diagnosis Date  . Diabetes mellitus without complication   . Hyperlipidemia   . Hernia 5284,1324  . Gout 2009  . Obesity   . Decreased libido   . Lumbago   . Inguinal hernia without mention of obstruction or gangrene, unilateral or unspecified, (not specified as recurrent)   . Unspecified constipation   . Chronic kidney disease, stage II (mild)   . Heart murmur   . Hypertension     Social History  Substance Use Topics  . Smoking status: Former Smoker -- 1.00 packs/day for 10 years    Types: Cigarettes  . Smokeless tobacco: Not on file  . Alcohol Use: 0.0 oz/week    0 Standard drinks or equivalent per week     Comment: social     Current outpatient prescriptions:  .  allopurinol  (ZYLOPRIM) 300 MG tablet, Take 300 mg by mouth daily., Disp: , Rfl:  .  aspirin 81 MG tablet, Take 162 mg by mouth daily., Disp: , Rfl:  .  bisacodyl (DULCOLAX) 5 MG EC tablet, Take 5 mg by mouth daily as needed. , Disp: , Rfl:  .  ezetimibe-simvastatin (VYTORIN) 10-40 MG per tablet, Take 1 tablet by mouth at bedtime. 2-3 times weekly., Disp: , Rfl:  .  glipiZIDE (GLUCOTROL XL) 10 MG 24 hr tablet, Take 10 mg by mouth 2 (two) times daily., Disp: , Rfl:  .  indapamide (LOZOL) 1.25 MG tablet, Take 1.25 mg by mouth daily., Disp: , Rfl:  .  metoCLOPramide (REGLAN) 5 MG tablet, Take 1 tablet (5 mg total) by mouth 4 (four) times daily -  with meals and at bedtime., Disp: 120 tablet, Rfl: 3 .  polyethylene glycol (MIRALAX / GLYCOLAX) packet, Take 17 g by mouth daily., Disp: , Rfl:  .  quinapril (ACCUPRIL) 40 MG tablet, Take 40 mg by mouth daily., Disp: , Rfl:  .  SitaGLIPtin-MetFORMIN HCl (JANUMET XR) [phone] MG TB24, Take 1 tablet by mouth daily., Disp: , Rfl:  .  tadalafil (CIALIS) 5 MG tablet, Take 5 mg by mouth daily as needed for erectile dysfunction., Disp: , Rfl:   No Known Allergies  Review of Systems  Constitutional: Negative for fever, chills and weight loss.  HENT: Negative for congestion, hearing loss, sore throat and tinnitus.   Eyes: Negative for blurred vision, double vision and redness.  Respiratory: Negative for cough, hemoptysis and shortness of breath.   Cardiovascular: Negative for chest pain, palpitations, orthopnea, claudication and leg swelling.  Gastrointestinal: Negative for heartburn, nausea, vomiting, diarrhea, constipation and blood in stool.  Genitourinary: Negative for dysuria, urgency, frequency and hematuria.  Musculoskeletal: Negative for myalgias, back pain, joint pain, falls and neck pain.  Skin: Negative for itching.  Neurological: Negative for dizziness, tingling, tremors, focal weakness, seizures, loss of consciousness, weakness and headaches.   Endo/Heme/Allergies: Does not bruise/bleed easily.  Psychiatric/Behavioral: Negative for depression and substance abuse. The patient is not nervous/anxious and does not have insomnia.      Objective  Filed Vitals:   11/07/14 1018  BP: 110/60  Pulse: 100  Temp: 98.6 F (37 C)  TempSrc: Oral  Resp: 18  Height: 6' (1.829 m)  Weight: 322 lb 4.8 oz (146.194 kg)  SpO2: 96%     Physical Exam  Constitutional: He is oriented to person, place, and time and well-developed, well-nourished, and in no distress.  Morbidly obese  HENT:  Head: Normocephalic.  Eyes: EOM are normal. Pupils are equal, round, and reactive to light.  Neck: Normal range of motion. Neck supple. No thyromegaly present.  Cardiovascular: Normal rate, regular rhythm, normal heart sounds and intact distal pulses.   No murmur heard. Pulmonary/Chest: Effort normal and breath sounds normal. No respiratory distress. He has no wheezes.  Abdominal: Soft. Bowel sounds are normal.  Musculoskeletal: Normal range of motion. He exhibits no edema.  Lymphadenopathy:    He has no cervical adenopathy.  Neurological: He is alert and oriented to person, place, and time. No cranial nerve deficit. Gait normal. Coordination normal.  Skin: Skin is warm and dry. No rash noted.  Psychiatric: Affect and judgment normal.      Assessment & Plan  1. Type 2 diabetes mellitus without complication Stable - POCT HgB A1C - POCT Glucose (CBG)  2. Hyperlipidemia  Lipid panel  3. Essential hypertension Well-controlled  4. Obesity Perennial problem. Refer back to diabetic teaching nurse but he refuses - TSH  5. Acute gout, unspecified cause, unspecified site Currently well controlled on his allopurinol - Comprehensive Metabolic Panel (CMET) - Uric acid  6. Colon cancer screening In need of colonoscopy - Ambulatory referral to Colorectal Surgery

## 2014-11-08 ENCOUNTER — Telehealth: Payer: Self-pay | Admitting: Emergency Medicine

## 2014-11-08 LAB — COMPREHENSIVE METABOLIC PANEL
ALK PHOS: 54 IU/L (ref 39–117)
ALT: 16 IU/L (ref 0–44)
AST: 20 IU/L (ref 0–40)
Albumin/Globulin Ratio: 1.1 (ref 1.1–2.5)
Albumin: 4 g/dL (ref 3.5–4.8)
BILIRUBIN TOTAL: 0.5 mg/dL (ref 0.0–1.2)
BUN/Creatinine Ratio: 18 (ref 10–22)
BUN: 18 mg/dL (ref 8–27)
CHLORIDE: 96 mmol/L — AB (ref 97–108)
CO2: 22 mmol/L (ref 18–29)
Calcium: 9.5 mg/dL (ref 8.6–10.2)
Creatinine, Ser: 0.99 mg/dL (ref 0.76–1.27)
GFR calc Af Amer: 87 mL/min/{1.73_m2} (ref >59)
GFR calc non Af Amer: 75 mL/min/{1.73_m2} (ref >59)
GLUCOSE: 150 mg/dL — AB (ref 65–99)
Globulin, Total: 3.6 g/dL (ref 1.5–4.5)
Potassium: 4.9 mmol/L (ref 3.5–5.2)
Sodium: 134 mmol/L (ref 134–144)
Total Protein: 7.6 g/dL (ref 6.0–8.5)

## 2014-11-08 LAB — TSH: TSH: 1.81 u[IU]/mL (ref 0.450–4.500)

## 2014-11-08 LAB — LIPID PANEL
CHOLESTEROL TOTAL: 123 mg/dL (ref 100–199)
Chol/HDL Ratio: 2.5 ratio units (ref 0.0–5.0)
HDL: 50 mg/dL (ref >39)
LDL Calculated: 55 mg/dL (ref 0–99)
Triglycerides: 89 mg/dL (ref 0–149)
VLDL Cholesterol Cal: 18 mg/dL (ref 5–40)

## 2014-11-08 LAB — URIC ACID: URIC ACID: 4.6 mg/dL (ref 3.7–8.6)

## 2014-11-08 NOTE — Telephone Encounter (Signed)
Patient notified of lab results

## 2014-11-09 ENCOUNTER — Ambulatory Visit (INDEPENDENT_AMBULATORY_CARE_PROVIDER_SITE_OTHER): Payer: Medicare PPO

## 2014-11-09 DIAGNOSIS — Z23 Encounter for immunization: Secondary | ICD-10-CM | POA: Diagnosis not present

## 2014-12-18 ENCOUNTER — Other Ambulatory Visit: Payer: Self-pay | Admitting: Family Medicine

## 2015-01-04 ENCOUNTER — Encounter: Payer: Self-pay | Admitting: Family Medicine

## 2015-03-19 ENCOUNTER — Other Ambulatory Visit: Payer: Self-pay | Admitting: Family Medicine

## 2015-03-22 ENCOUNTER — Other Ambulatory Visit: Payer: Self-pay | Admitting: Family Medicine

## 2015-03-22 MED ORDER — ATORVASTATIN CALCIUM 40 MG PO TABS: 40.0000 mg | ORAL_TABLET | Freq: Every day | ORAL | Status: DC

## 2015-03-22 MED ORDER — GLIPIZIDE ER 10 MG PO TB24: 10.0000 mg | ORAL_TABLET | Freq: Two times a day (BID) | ORAL | Status: DC

## 2015-03-22 MED ORDER — EZETIMIBE-SIMVASTATIN 10-40 MG PO TABS: 1.0000 | ORAL_TABLET | Freq: Every day | ORAL | Status: DC

## 2015-03-23 ENCOUNTER — Other Ambulatory Visit: Payer: Self-pay

## 2015-03-29 ENCOUNTER — Ambulatory Visit: Payer: Medicare PPO | Admitting: Cardiovascular Disease

## 2015-03-29 ENCOUNTER — Encounter: Payer: Self-pay | Admitting: *Deleted

## 2015-05-10 ENCOUNTER — Ambulatory Visit: Payer: Medicare PPO | Admitting: Family Medicine

## 2015-05-23 ENCOUNTER — Ambulatory Visit: Payer: Medicare PPO | Admitting: Family Medicine

## 2015-06-18 ENCOUNTER — Other Ambulatory Visit: Payer: Self-pay | Admitting: Family Medicine

## 2015-06-19 ENCOUNTER — Other Ambulatory Visit: Payer: Self-pay

## 2015-06-19 DIAGNOSIS — L84 Corns and callosities: Secondary | ICD-10-CM

## 2015-06-19 MED ORDER — SITAGLIPTIN PHOS-METFORMIN HCL 50-1000 MG PO TABS: 1.0000 | ORAL_TABLET | Freq: Two times a day (BID) | ORAL | Status: DC

## 2015-06-19 MED ORDER — ATORVASTATIN CALCIUM 40 MG PO TABS: 40.0000 mg | ORAL_TABLET | Freq: Every day | ORAL | Status: DC

## 2015-07-19 ENCOUNTER — Ambulatory Visit: Payer: Medicare PPO | Admitting: Family Medicine

## 2015-07-20 ENCOUNTER — Encounter: Payer: Self-pay | Admitting: Sports Medicine

## 2015-07-20 ENCOUNTER — Ambulatory Visit (INDEPENDENT_AMBULATORY_CARE_PROVIDER_SITE_OTHER): Payer: Medicare PPO | Admitting: Sports Medicine

## 2015-07-20 DIAGNOSIS — M79673 Pain in unspecified foot: Secondary | ICD-10-CM

## 2015-07-20 DIAGNOSIS — L84 Corns and callosities: Secondary | ICD-10-CM | POA: Diagnosis not present

## 2015-07-20 DIAGNOSIS — E119 Type 2 diabetes mellitus without complications: Secondary | ICD-10-CM | POA: Diagnosis not present

## 2015-07-20 NOTE — Progress Notes (Signed)
Patient ID: Jordan [name], male   DOB: Feb 21, 1941, 75 y.o.   MRN: [id number] Subjective: Jordan [name] is a 75 y.o. male patient with history of diabetes who presents to office today complaining of painful corn; unable to trim. Patient states that the glucose reading this morning was not recorded; Diagnosed 15 years ago. Patient denies any new changes in medication or new problems. Patient denies any new cramping, numbness, burning or tingling in the legs.  A1C Unknown  Patient Active Problem List   Diagnosis Date Noted  . Type 2 diabetes mellitus without complication (HCC) 11/07/2014  . Hyperlipidemia 11/07/2014  . Essential hypertension 11/07/2014  . Obesity 11/07/2014  . Acute gout 11/07/2014  . Colon cancer screening 11/07/2014  . 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  . Annual physical exam 09/21/2014  . Epistaxis 09/21/2014  . 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 Heart murmur 12/02/2012  . Preoperative cardiovascular examination 12/02/2012  . Hypertension   . Unspecified constipation 10/26/2012  . Incisional hernia, without obstruction or gangrene 10/26/2012  . Hernia    Current Outpatient Prescriptions on File Prior to Visit  Medication Sig Dispense Refill  . allopurinol (ZYLOPRIM) 300 MG tablet Take 300 mg by mouth daily.    Marland Kitchen. aspirin 81 MG tablet Take 162 mg by mouth daily.    Marland Kitchen. atorvastatin (LIPITOR) 40 MG tablet Take 1 tablet (40 mg total) by mouth daily at 6 PM. 90 tablet 0  . bisacodyl (DULCOLAX) 5 MG EC tablet Take 5 mg by mouth daily as needed.     . ezetimibe-simvastatin (VYTORIN) 10-40 MG tablet Take 1 tablet by mouth at bedtime. 2-3 times weekly. 90 tablet 1  . glipiZIDE (GLUCOTROL XL) 10 MG 24 hr tablet Take 1 tablet (10 mg total) by mouth 2 (two) times daily. 180 tablet 1  . indapamide (LOZOL) 1.25 MG tablet TAKE 1 TABLET EVERY DAY 90 tablet 1  . metoCLOPramide (REGLAN) 5 MG tablet Take 1 tablet (5 mg total) by mouth 4 (four) times daily -  with  meals and at bedtime. 120 tablet 3  . polyethylene glycol (MIRALAX / GLYCOLAX) packet Take 17 g by mouth daily.    . quinapril (ACCUPRIL) 40 MG tablet TAKE 1 TABLET EVERY DAY 90 tablet 1  . sitaGLIPtin-metformin (JANUMET) 50-1000 MG tablet Take 1 tablet by mouth 2 (two) times daily. 180 tablet 0  . SitaGLIPtin-MetFORMIN HCl (JANUMET XR) [phone] MG TB24 Take 1 tablet by mouth daily.    . tadalafil (CIALIS) 5 MG tablet Take 5 mg by mouth daily as needed for erectile dysfunction.     No current facility-administered medications on file prior to visit.   No Known Allergies  No results found for this or any previous visit (from the past 2160 hour(s)).  Objective: General: Patient is awake, alert, and oriented x 3 and in no acute distress.  Integument: Skin is warm, dry and supple bilateral. Nails are short and mildly dystrophic. No signs of infection. No open lesions present bilateral + Callus at right 2nd toe medial aspect, right plantar hallux and left plantar medial hallux with no signs of infection. Remaining integument unremarkable.  Vasculature:  Dorsalis Pedis pulse 2/4 bilateral. Posterior Tibial pulse  1/4 bilateral.  Capillary fill time <3 sec 1-5 bilateral. Scant hair growth to the level of the digits. Temperature gradient within normal limits. No varicosities present bilateral. No edema present bilateral.   Neurology: The patient has intact sensation measured with a 5.07/10g Semmes Weinstein Monofilament at all  pedal sites bilateral . Vibratory sensation intact bilateral with tuning fork. No Babinski sign present bilateral.   Musculoskeletal: No symptomatic pedal deformities noted bilateral. Muscular strength 5/5 in all lower extremity muscular groups bilateral without pain on range of motion . No tenderness with calf compression bilateral.  Assessment and Plan: Problem List Items Addressed This Visit      Endocrine   Type 2 diabetes mellitus without complication ([place]) - Primary     Other Visit Diagnoses    Corns and callosities        Foot pain, unspecified laterality           -Examined patient. -Discussed and educated patient on diabetic foot care, especially with  regards to the vascular, neurological and musculoskeletal systems.  -Stressed the importance of good glycemic control and the detriment of not  controlling glucose levels in relation to the foot. -Mechanically debrided callus x3 using sterile chisel blade without incident  -Answered all patient questions -Patient to return in 3 months for at risk foot care -Patient advised to call the office if any problems or questions arise in the meantime.  Landis Martins, DPM

## 2015-07-20 NOTE — Patient Instructions (Signed)
Diabetes and Foot Care Diabetes may cause you to have problems because of poor blood supply (circulation) to your feet and legs. This may cause the skin on your feet to become thinner, break easier, and heal more slowly. Your skin may become dry, and the skin may peel and crack. You may also have nerve damage in your legs and feet causing decreased feeling in them. You may not notice minor injuries to your feet that could lead to infections or more serious problems. Taking care of your feet is one of the most important things you can do for yourself.  HOME CARE INSTRUCTIONS  Wear shoes at all times, even in the house. Do not go barefoot. Bare feet are easily injured.  Check your feet daily for blisters, cuts, and redness. If you cannot see the bottom of your feet, use a mirror or ask someone for help.  Wash your feet with warm water (do not use hot water) and mild soap. Then pat your feet and the areas between your toes until they are completely dry. Do not soak your feet as this can dry your skin.  Apply a moisturizing lotion or petroleum jelly (that does not contain alcohol and is unscented) to the skin on your feet and to dry, brittle toenails. Do not apply lotion between your toes.  Trim your toenails straight across. Do not dig under them or around the cuticle. File the edges of your nails with an emery board or nail file.  Do not cut corns or calluses or try to remove them with medicine.  Wear clean socks or stockings every day. Make sure they are not too tight. Do not wear knee-high stockings since they may decrease blood flow to your legs.  Wear shoes that fit properly and have enough cushioning. To break in new shoes, wear them for just a few hours a day. This prevents you from injuring your feet. Always look in your shoes before you put them on to be sure there are no objects inside.  Do not cross your legs. This may decrease the blood flow to your feet.  If you find a minor scrape,  cut, or break in the skin on your feet, keep it and the skin around it clean and dry. These areas may be cleansed with mild soap and water. Do not cleanse the area with peroxide, alcohol, or iodine.  When you remove an adhesive bandage, be sure not to damage the skin around it.  If you have a wound, look at it several times a day to make sure it is healing.  Do not use heating pads or hot water bottles. They may burn your skin. If you have lost feeling in your feet or legs, you may not know it is happening until it is too late.  Make sure your health care provider performs a complete foot exam at least annually or more often if you have foot problems. Report any cuts, sores, or bruises to your health care provider immediately. SEEK MEDICAL CARE IF:   You have an injury that is not healing.  You have cuts or breaks in the skin.  You have an ingrown nail.  You notice redness on your legs or feet.  You feel burning or tingling in your legs or feet.  You have pain or cramps in your legs and feet.  Your legs or feet are numb.  Your feet always feel cold. SEEK IMMEDIATE MEDICAL CARE IF:   There is increasing redness,   swelling, or pain in or around a wound.  There is a red line that goes up your leg.  Pus is coming from a wound.  You develop a fever or as directed by your health care provider.  You notice a bad smell coming from an ulcer or wound.   This information is not intended to replace advice given to you by your health care provider. Make sure you discuss any questions you have with your health care provider.   Document Released: 02/29/2000 Document Revised: 11/03/2012 Document Reviewed: 08/10/2012 Elsevier Interactive Patient Education 2016 [employer]  

## 2015-08-27 ENCOUNTER — Ambulatory Visit (INDEPENDENT_AMBULATORY_CARE_PROVIDER_SITE_OTHER): Payer: Medicare PPO | Admitting: Family Medicine

## 2015-08-27 VITALS — BP 138/68 | HR 106 | Temp 98.1°F | Resp 20 | Ht 72.0 in | Wt 303.2 lb

## 2015-08-27 DIAGNOSIS — E119 Type 2 diabetes mellitus without complications: Secondary | ICD-10-CM

## 2015-08-27 DIAGNOSIS — K5900 Constipation, unspecified: Secondary | ICD-10-CM | POA: Diagnosis not present

## 2015-08-27 DIAGNOSIS — E785 Hyperlipidemia, unspecified: Secondary | ICD-10-CM

## 2015-08-27 DIAGNOSIS — I10 Essential (primary) hypertension: Secondary | ICD-10-CM

## 2015-08-27 DIAGNOSIS — M1000 Idiopathic gout, unspecified site: Secondary | ICD-10-CM | POA: Diagnosis not present

## 2015-08-27 LAB — POCT GLYCOSYLATED HEMOGLOBIN (HGB A1C): Hemoglobin A1C: 6.6

## 2015-08-27 LAB — GLUCOSE, POCT (MANUAL RESULT ENTRY): POC Glucose: 177 mg/dl — AB (ref 70–99)

## 2015-08-27 MED ORDER — GLIPIZIDE ER 5 MG PO TB24: 5.0000 mg | ORAL_TABLET | Freq: Every day | ORAL | Status: DC

## 2015-08-27 MED ORDER — INDAPAMIDE 1.25 MG PO TABS: 1.2500 mg | ORAL_TABLET | ORAL | Status: DC

## 2015-08-27 MED ORDER — ALLOPURINOL 300 MG PO TABS: 300.0000 mg | ORAL_TABLET | Freq: Every day | ORAL | Status: DC

## 2015-08-27 MED ORDER — QUINAPRIL HCL 40 MG PO TABS: 40.0000 mg | ORAL_TABLET | Freq: Every day | ORAL | Status: DC

## 2015-08-27 MED ORDER — ATORVASTATIN CALCIUM 40 MG PO TABS: 40.0000 mg | ORAL_TABLET | Freq: Every day | ORAL | Status: DC

## 2015-08-27 MED ORDER — SITAGLIPTIN PHOS-METFORMIN HCL 50-1000 MG PO TABS: 1.0000 | ORAL_TABLET | Freq: Two times a day (BID) | ORAL | Status: DC

## 2015-08-28 LAB — COMPREHENSIVE METABOLIC PANEL
ALT: 24 IU/L (ref 0–44)
AST: 24 IU/L (ref 0–40)
Albumin/Globulin Ratio: 1 — ABNORMAL LOW (ref 1.2–2.2)
Albumin: 3.9 g/dL (ref 3.5–4.8)
Alkaline Phosphatase: 45 IU/L (ref 39–117)
BUN/Creatinine Ratio: 22 (ref 10–24)
BUN: 21 mg/dL (ref 8–27)
Bilirubin Total: 0.3 mg/dL (ref 0.0–1.2)
CALCIUM: 9.6 mg/dL (ref 8.6–10.2)
CO2: 21 mmol/L (ref 18–29)
CREATININE: 0.95 mg/dL (ref 0.76–1.27)
Chloride: 97 mmol/L (ref 96–106)
GFR calc Af Amer: 91 mL/min/{1.73_m2} (ref >59)
GFR, EST NON AFRICAN AMERICAN: 79 mL/min/{1.73_m2} (ref >59)
Globulin, Total: 3.9 g/dL (ref 1.5–4.5)
Glucose: 168 mg/dL — ABNORMAL HIGH (ref 65–99)
Potassium: 5.1 mmol/L (ref 3.5–5.2)
Sodium: 135 mmol/L (ref 134–144)
Total Protein: 7.8 g/dL (ref 6.0–8.5)

## 2015-08-28 LAB — LIPID PANEL
Chol/HDL Ratio: 3.3 ratio units (ref 0.0–5.0)
Cholesterol, Total: 131 mg/dL (ref 100–199)
HDL: 40 mg/dL (ref >39)
LDL CALC: 75 mg/dL (ref 0–99)
Triglycerides: 79 mg/dL (ref 0–149)
VLDL CHOLESTEROL CAL: 16 mg/dL (ref 5–40)

## 2015-08-28 LAB — MICROALBUMIN / CREATININE URINE RATIO
CREATININE, UR: 102.8 mg/dL
MICROALB/CREAT RATIO: 73.8 mg/g creat — ABNORMAL HIGH (ref 0.0–30.0)
Microalbumin, Urine: 75.9 ug/mL

## 2015-08-28 LAB — CBC
HEMOGLOBIN: 12.8 g/dL (ref 12.6–17.7)
Hematocrit: 38.6 % (ref 37.5–51.0)
MCH: 28.8 pg (ref 26.6–33.0)
MCHC: 33.2 g/dL (ref 31.5–35.7)
MCV: 87 fL (ref 79–97)
Platelets: 282 10*3/uL (ref 150–379)
RBC: 4.45 x10E6/uL (ref 4.14–5.80)
RDW: 17.5 % — ABNORMAL HIGH (ref 12.3–15.4)
WBC: 7.1 10*3/uL (ref 3.4–10.8)

## 2015-08-28 LAB — URIC ACID: URIC ACID: 5.1 mg/dL (ref 3.7–8.6)

## 2015-08-28 LAB — VITAMIN B12: Vitamin B-12: 482 pg/mL (ref 211–946)

## 2015-08-28 LAB — VITAMIN D 25 HYDROXY (VIT D DEFICIENCY, FRACTURES): Vit D, 25-Hydroxy: 50.6 ng/mL (ref 30.0–100.0)

## 2015-08-28 MED ORDER — QUINAPRIL HCL 40 MG PO TABS: 40.0000 mg | ORAL_TABLET | Freq: Two times a day (BID) | ORAL | Status: DC

## 2015-08-28 NOTE — Addendum Note (Signed)
Addended by: Adline Potter on: 08/28/2015 03:22 PM   Modules accepted: Orders

## 2015-08-28 NOTE — Progress Notes (Signed)
Date:  08/27/2015   Name:  Jordan [name]   DOB:  [birth year]   MRN:  [id number]  PCP:  Ashok Norris, MD    Chief Complaint: Diabetes; Hyperlipidemia; Hypertension; and Night Sweats   History of Present Illness:  This is a 75 y.o. male seen in 21 month f/u. T2DM on Janumet and glipizide, has been having low blood sugars despite lowering glipizide dose to 5 mg daily. HLD on Lipitor, gout on allopurinol, HTN on quinapril/Lozol, constipation on Miralax prn well controlled. Weight down 19# since August. C/o occ chills and night sweats.   Review of Systems:  Review of Systems  Constitutional: Negative for fever and fatigue.  Respiratory: Negative for cough and shortness of breath.   Cardiovascular: Negative for chest pain and leg swelling.  Endocrine: Negative for polyuria.  Genitourinary: Negative for difficulty urinating.  Neurological: Negative for syncope and light-headedness.    Patient Active Problem List   Diagnosis Date Noted  . Type 2 diabetes mellitus without complication ([place]) 0000000  . Hyperlipidemia 11/07/2014  . Essential hypertension 11/07/2014  . Obesity, Class III, BMI 40-49.9 (morbid obesity) ([place]) 11/07/2014  . Gout 11/07/2014  . 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  . Epistaxis 09/21/2014  . 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 Heart murmur 12/02/2012  . Preoperative cardiovascular examination 12/02/2012  . Constipation 10/26/2012  . Incisional hernia, without obstruction or gangrene 10/26/2012  . Hernia     Prior to Admission medications   Medication Sig Start Date End Date Taking? Authorizing Provider  allopurinol (ZYLOPRIM) 300 MG tablet Take 1 tablet (300 mg total) by mouth daily. 08/27/15   Adline Potter, MD  aspirin 81 MG tablet Take 162 mg by mouth daily.    Historical Provider, MD  atorvastatin (LIPITOR) 40 MG tablet Take 1 tablet (40 mg total) by mouth daily at 6 PM. 08/27/15   Adline Potter, MD  indapamide (LOZOL) 1.25 MG tablet  Take 1 tablet (1.25 mg total) by mouth every other day. 08/27/15   Adline Potter, MD  latanoprost (XALATAN) 0.005 % ophthalmic solution  06/18/15   Historical Provider, MD  polyethylene glycol (MIRALAX / GLYCOLAX) packet Take 17 g by mouth daily as needed.    Historical Provider, MD  quinapril (ACCUPRIL) 40 MG tablet Take 1 tablet (40 mg total) by mouth daily. 08/27/15   Adline Potter, MD  sitaGLIPtin-metformin (JANUMET) 50-1000 MG tablet Take 1 tablet by mouth 2 (two) times daily. 08/27/15   Adline Potter, MD    No Known Allergies  Past Surgical History  Procedure Laterality Date  . Umbilical hernia repair  2011  . Eye surgery  2011    cataract  . Colonoscopy  2012  . Cataract surgery      Social History  Substance Use Topics  . Smoking status: Former Smoker -- 1.00 packs/day for 10 years    Types: Cigarettes  . Smokeless tobacco: Not on file  . Alcohol Use: 0.0 oz/week    0 Standard drinks or equivalent per week     Comment: social    Family History  Problem Relation Age of Onset  . Heart disease Father   . Hypertension Father     Medication list has been reviewed and updated.  Physical Examination: BP 138/68 mmHg  Pulse 106  Temp(Src) 98.1 F (36.7 C)  Resp 20  Ht 6' (1.829 m)  Wt 303 lb 4 oz (137.553 kg)  BMI 41.12 kg/m2  SpO2 97%  Physical Exam  Constitutional: He appears well-developed  and well-nourished.  Cardiovascular: Normal rate, regular rhythm and normal heart sounds.   Pulmonary/Chest: Effort normal and breath sounds normal.  Musculoskeletal: He exhibits no edema.  Neurological: He is alert.  Skin: Skin is warm and dry.  Psychiatric: He has a normal mood and affect. His behavior is normal.  Nursing note and vitals reviewed.   Assessment and Plan:  1. Type 2 diabetes mellitus without complication, without long-term current use of insulin (HCC) A1c 6.6% today, d/c glipizide due to hypoglycemia, cont Janumet - POCT HgB A1C - POCT Glucose (CBG) -  Urine Microalbumin w/creat. ratio - B12  2. Essential hypertension Adequate control, continue quinapril/Lozol - Comprehensive Metabolic Panel (CMET) - CBC  3. Constipation, unspecified constipation type Well controlled, cont Miralax prn  4. Hyperlipidemia Unclear control on Lipitor - Lipid Profile  5. Idiopathic gout, unspecified chronicity, unspecified site Well controlled on allopurinol - Uric acid  6. Obesity, Class III, BMI 40-49.9 (morbid obesity) (HCC) Continue weight loss - Vitamin D (25 hydroxy)  Return in about 3 months (around 11/27/2015).  Satira Anis. [facility]  08/28/2015

## 2015-08-31 ENCOUNTER — Telehealth: Payer: Self-pay

## 2015-08-31 NOTE — Telephone Encounter (Signed)
Pharmacy faxed paperwork stating that dosage was increased on script sent on 6/12 from 1 tab daily to 2 tabs daily and they wanted to know which dosage was correct. I checked your notes and did not see any mention of increasing his dosage but I just wanted to be sure.

## 2015-08-31 NOTE — Telephone Encounter (Signed)
Quinapril, apologize for not including it in first message.

## 2015-08-31 NOTE — Telephone Encounter (Signed)
Which medication are you referring to?

## 2015-08-31 NOTE — Telephone Encounter (Signed)
See previous note. I did increase quinapril to 40 mg bid for elevated urine microalbumin.

## 2015-10-26 ENCOUNTER — Encounter: Payer: Self-pay | Admitting: Family Medicine

## 2015-10-26 ENCOUNTER — Ambulatory Visit (INDEPENDENT_AMBULATORY_CARE_PROVIDER_SITE_OTHER): Payer: Medicare PPO | Admitting: Family Medicine

## 2015-10-26 VITALS — BP 106/64 | HR 133 | Temp 98.1°F | Resp 18 | Ht 69.0 in | Wt 295.7 lb

## 2015-10-26 DIAGNOSIS — E1140 Type 2 diabetes mellitus with diabetic neuropathy, unspecified: Secondary | ICD-10-CM | POA: Diagnosis not present

## 2015-10-26 DIAGNOSIS — I10 Essential (primary) hypertension: Secondary | ICD-10-CM | POA: Diagnosis not present

## 2015-10-26 DIAGNOSIS — E1129 Type 2 diabetes mellitus with other diabetic kidney complication: Secondary | ICD-10-CM | POA: Diagnosis not present

## 2015-10-26 DIAGNOSIS — T464X1A Poisoning by angiotensin-converting-enzyme inhibitors, accidental (unintentional), initial encounter: Secondary | ICD-10-CM | POA: Diagnosis not present

## 2015-10-26 DIAGNOSIS — N5089 Other specified disorders of the male genital organs: Secondary | ICD-10-CM

## 2015-10-26 DIAGNOSIS — Z888 Allergy status to other drugs, medicaments and biological substances status: Secondary | ICD-10-CM | POA: Diagnosis not present

## 2015-10-26 DIAGNOSIS — T783XXA Angioneurotic edema, initial encounter: Secondary | ICD-10-CM

## 2015-10-26 DIAGNOSIS — R809 Proteinuria, unspecified: Secondary | ICD-10-CM | POA: Diagnosis not present

## 2015-10-26 DIAGNOSIS — N521 Erectile dysfunction due to diseases classified elsewhere: Secondary | ICD-10-CM

## 2015-10-26 DIAGNOSIS — Z789 Other specified health status: Secondary | ICD-10-CM

## 2015-10-26 MED ORDER — EMPAGLIFLOZIN 25 MG PO TABS: 25.0000 mg | ORAL_TABLET | Freq: Every day | ORAL | 2 refills | Status: DC

## 2015-10-26 MED ORDER — AMLODIPINE BESYLATE 2.5 MG PO TABS: 2.5000 mg | ORAL_TABLET | Freq: Every day | ORAL | 0 refills | Status: DC

## 2015-10-26 NOTE — Progress Notes (Signed)
Name: Jordan [name]   MRN: [id number]    DOB: September 06, 1940   Date:10/26/2015       Progress Note  Subjective  Chief Complaint  Chief Complaint  Patient presents with  . Allergic Reaction    Patient has 1 episode of edema of his tongue, lips, testicles since changing his medication from last visit. Dr. Vicente Masson took patient off of Glipizide and double up his Quinapril on last visit. Patient states his sugar has been acting up but does not check it on a regular basis.     HPI  Angioedema : seen by Dr. Vicente Masson in June, and medication adjusted and Quinapril increased to two pills daily, he states since than he has developed 3 episodes of angioedema. First one was the tongue that swell up, second time it was the lip. He states those symptoms have resolved  Right testicular pain and swelling: he thought it was also related to medication, it has been present for a couple of weeks, not sure if it is red, no increase in warmth, no dysuria but he has episodes of increase in urinary frequency, he has chronic low back pain - no change. No hematuria.   DM with ED: he was on Glipizide but stopped by Dr. Vicente Masson in June, currently only on [place] and he states he thinks glucose is getting higher, having urinary frequency, feeling more hungry, discussed options, he is willing to try Synjardy he refuses injectables, we will recheck labs next month.    Patient Active Problem List   Diagnosis Date Noted  . 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 Hyperlipidemia 11/07/2014  . Essential hypertension 11/07/2014  . Obesity, Class III, BMI 40-49.9 (morbid obesity) ([place]) 11/07/2014  . Gout 11/07/2014  . 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  . Epistaxis 09/21/2014  . 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 Heart murmur 12/02/2012  . Constipation 10/26/2012  . Incisional hernia, without obstruction or gangrene 10/26/2012  . Hernia     Past Surgical History:   Procedure Laterality Date  . cataract surgery    . COLONOSCOPY  2012  . EYE SURGERY  2011   cataract  . UMBILICAL HERNIA REPAIR  2011    Family History  Problem Relation Age of Onset  . Heart disease Father   . Hypertension Father     Social History   Social History  . Marital status: Widowed    Spouse name: N/A  . Number of children: N/A  . Years of education: N/A   Occupational History  . Not on file.   Social History Main Topics  . Smoking status: Former Smoker    Packs/day: 1.00    Years: 10.00    Types: Cigarettes  . Smokeless tobacco: Never Used  . Alcohol use 0.0 oz/week     Comment: social  . Drug use: No  . Sexual activity: Not on file   Other Topics Concern  . Not on file   Social History Narrative  . No narrative on file     Current Outpatient Prescriptions:  .  allopurinol (ZYLOPRIM) 300 MG tablet, Take 1 tablet (300 mg total) by mouth daily., Disp: 90 tablet, Rfl: 3 .  aspirin 81 MG tablet, Take 162 mg by mouth daily., Disp: , Rfl:  .  atorvastatin (LIPITOR) 40 MG tablet, Take 1 tablet (40 mg total) by mouth daily at 6 PM., Disp: 90 tablet, Rfl: 3 .  indapamide (LOZOL) 1.25 MG tablet, Take 1 tablet (1.25  mg total) by mouth every other day., Disp: 90 tablet, Rfl: 3 .  latanoprost (XALATAN) 0.005 % ophthalmic solution, , Disp: , Rfl:  .  polyethylene glycol (MIRALAX / GLYCOLAX) packet, Take 17 g by mouth daily as needed., Disp: , Rfl:  .  sitaGLIPtin-metformin (JANUMET) 50-1000 MG tablet, Take 1 tablet by mouth 2 (two) times daily., Disp: 180 tablet, Rfl: 3 .  amLODipine (NORVASC) 2.5 MG tablet, Take 1 tablet (2.5 mg total) by mouth daily., Disp: 30 tablet, Rfl: 0 .  empagliflozin (JARDIANCE) 25 MG TABS tablet, Take 25 mg by mouth daily., Disp: 30 tablet, Rfl: 2  Allergies  Allergen Reactions  . Ace Inhibitors Other (See Comments)    angioedema     ROS  Ten systems reviewed and is negative except as mentioned in HPI  Weight  loss  Objective  Vitals:   10/26/15 1626  BP: 106/64  Pulse: (!) 133  Resp: 18  Temp: 98.1 F (36.7 C)  TempSrc: Oral  SpO2: 94%  Weight: 295 lb 11.2 oz (134.1 kg)  Height: 5\' 9"  (1.753 m)    Body mass index is 43.67 kg/m.  Physical Exam  Constitutional: Patient appears well-developed and well-nourished. Obese  No distress.  HEENT: head atraumatic, normocephalic, pupils equal and reactive to light, neck supple, throat within normal limits Cardiovascular: Normal rate, regular rhythm and normal heart sounds.  No murmur heard. No BLE edema. Pulmonary/Chest: Effort normal and breath sounds normal. No respiratory distress. Abdominal: Soft.  There is no tenderness. Psychiatric: Patient has a normal mood and affect. behavior is normal. Judgment and thought content normal. Genitourinary: right testicle is non-tender but firm and triple the side of left testicle  Recent Results (from the past 2160 hour(s))  POCT HgB A1C     Status: None   Collection Time: 08/27/15 10:42 AM  Result Value Ref Range   Hemoglobin A1C 6.6   POCT Glucose (CBG)     Status: Abnormal   Collection Time: 08/27/15 10:42 AM  Result Value Ref Range   POC Glucose 177 (A) 70 - 99 mg/dl  Comprehensive Metabolic Panel (CMET)     Status: Abnormal   Collection Time: 08/27/15 11:58 AM  Result Value Ref Range   Glucose 168 (H) 65 - 99 mg/dL   BUN 21 8 - 27 mg/dL   Creatinine, Ser 0.95 0.76 - 1.27 mg/dL   GFR calc non Af Amer 79 >59 mL/min/1.73   GFR calc Af Amer 91 >59 mL/min/1.73   BUN/Creatinine Ratio 22 10 - 24   Sodium 135 134 - 144 mmol/L   Potassium 5.1 3.5 - 5.2 mmol/L   Chloride 97 96 - 106 mmol/L   CO2 21 18 - 29 mmol/L   Calcium 9.6 8.6 - 10.2 mg/dL   Total Protein 7.8 6.0 - 8.5 g/dL   Albumin 3.9 3.5 - 4.8 g/dL   Globulin, Total 3.9 1.5 - 4.5 g/dL   Albumin/Globulin Ratio 1.0 (L) 1.2 - 2.2   Bilirubin Total 0.3 0.0 - 1.2 mg/dL   Alkaline Phosphatase 45 39 - 117 IU/L   AST 24 0 - 40 IU/L   ALT  24 0 - 44 IU/L  CBC     Status: Abnormal   Collection Time: 08/27/15 11:58 AM  Result Value Ref Range   WBC 7.1 3.4 - 10.8 x10E3/uL   RBC 4.45 4.14 - 5.80 x10E6/uL   Hemoglobin 12.8 12.6 - 17.7 g/dL   Hematocrit 38.6 37.5 - 51.0 %   MCV 87  79 - 97 fL   MCH 28.8 26.6 - 33.0 pg   MCHC 33.2 31.5 - 35.7 g/dL   RDW 17.5 (H) 12.3 - 15.4 %   Platelets 282 150 - 379 x10E3/uL  Urine Microalbumin w/creat. ratio     Status: Abnormal   Collection Time: 08/27/15 11:58 AM  Result Value Ref Range   Creatinine, Urine 102.8 Not Estab. mg/dL   Microalbum.,U,Random 75.9 Not Estab. ug/mL   MICROALB/CREAT RATIO 73.8 (H) 0.0 - 30.0 mg/g creat  Uric acid     Status: None   Collection Time: 08/27/15 11:58 AM  Result Value Ref Range   Uric Acid 5.1 3.7 - 8.6 mg/dL    Comment:            Therapeutic target for gout patients: <6.0  Vitamin D (25 hydroxy)     Status: None   Collection Time: 08/27/15 11:58 AM  Result Value Ref Range   Vit D, 25-Hydroxy 50.6 30.0 - 100.0 ng/mL    Comment: Vitamin D deficiency has been defined by the [place] and an Endocrine Society practice guideline as a level of serum 25-OH vitamin D less than 20 ng/mL (1,2). The Endocrine Society went on to further define vitamin D insufficiency as a level between 21 and 29 ng/mL (2). 1. IOM (Institute of Medicine). 2010. Dietary reference    intakes for calcium and D. [place]: The    [employer]. 2. Holick MF, Binkley [place], Bischoff-Ferrari HA, et al.    Evaluation, treatment, and prevention of vitamin D    deficiency: an Endocrine Society clinical practice    guideline. JCEM. 2011 Jul; 96(7):1911-30.   B12     Status: None   Collection Time: 08/27/15 11:58 AM  Result Value Ref Range   Vitamin B-12 482 211 - 946 pg/mL  Lipid Profile     Status: None   Collection Time: 08/27/15 11:58 AM  Result Value Ref Range   Cholesterol, Total 131 100 - 199 mg/dL   Triglycerides 79 0 - 149 mg/dL   HDL 40 >39  mg/dL   VLDL Cholesterol Cal 16 5 - 40 mg/dL   LDL Calculated 75 0 - 99 mg/dL   Chol/HDL Ratio 3.3 0.0 - 5.0 ratio units    Comment:                                   T. Chol/HDL Ratio                                             Men  Women                               1/2 Avg.Risk  3.4    3.3                                   Avg.Risk  5.0    4.4                                2X Avg.Risk  9.6    7.1  3X Avg.Risk 23.4   11.0      PHQ2/9: Depression screen [facility] 2/9 10/26/2015 09/21/2014  Decreased Interest 1 0  Down, Depressed, Hopeless 0 0  PHQ - 2 Score 1 0     Fall Risk: Fall Risk  10/26/2015 11/07/2014 09/21/2014  Falls in the past year? Yes No No  Number falls in past yr: 2 or more - -  Injury with Fall? No - -  Risk for fall due to : History of fall(s);Medication side effect - -  Follow up Falls evaluation completed - -      Assessment & Plan  1. Angioedema, initial encounter  Stop Quinapril   2. ACE inhibitor intolerance   3. Essential hypertension  Stop quinapril  - amLODipine (NORVASC) 2.5 MG tablet; Take 1 tablet (2.5 mg total) by mouth daily.  Dispense: 30 tablet; Refill: 0  4. 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  Dispense: 30 tablet; Refill: 2  5. Testicular swelling, right  - Ambulatory referral to Urology - mass during exam   6. Controlled type 2 diabetes mellitus with microalbuminuria, without long-term current use of insulin ([place])  Unfortunately we will have to stop ace because of angioedema

## 2015-10-26 NOTE — Patient Instructions (Signed)
Stop Quinapril  40 mg Start Norvasc ( amlodipine ) 2.5 mg daily  Taking diuretic once daily Start Jardiance for DM and stop any sweet beverage and starches

## 2015-11-02 ENCOUNTER — Encounter: Payer: Self-pay | Admitting: Sports Medicine

## 2015-11-02 ENCOUNTER — Ambulatory Visit (INDEPENDENT_AMBULATORY_CARE_PROVIDER_SITE_OTHER): Payer: Medicare PPO | Admitting: Sports Medicine

## 2015-11-02 DIAGNOSIS — L84 Corns and callosities: Secondary | ICD-10-CM | POA: Diagnosis not present

## 2015-11-02 DIAGNOSIS — B351 Tinea unguium: Secondary | ICD-10-CM

## 2015-11-02 DIAGNOSIS — E119 Type 2 diabetes mellitus without complications: Secondary | ICD-10-CM | POA: Diagnosis not present

## 2015-11-02 DIAGNOSIS — M79673 Pain in unspecified foot: Secondary | ICD-10-CM | POA: Diagnosis not present

## 2015-11-02 NOTE — Progress Notes (Signed)
Patient ID: Jordan [name], male   DOB: September 05, 1940, 75 y.o.   MRN: [id number] Subjective: Jordan [name] is a 75 y.o. male patient with history of diabetes who presents to office today complaining of painful corn and nails; unable to trim. Patient states that the glucose reading this morning was "good". Patient denies any new changes in medication or new problems. Patient denies any new cramping, numbness, burning or tingling in the legs.   Patient Active Problem List   Diagnosis Date Noted  . Controlled type 2 diabetes mellitus with microalbuminuria ([place]) 10/26/2015  . 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 Hyperlipidemia 11/07/2014  . Essential hypertension 11/07/2014  . Obesity, Class III, BMI 40-49.9 (morbid obesity) ([place]) 11/07/2014  . Gout 11/07/2014  . 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  . Epistaxis 09/21/2014  . 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 Heart murmur 12/02/2012  . Constipation 10/26/2012  . Incisional hernia, without obstruction or gangrene 10/26/2012  . Hernia    Current Outpatient Prescriptions on File Prior to Visit  Medication Sig Dispense Refill  . allopurinol (ZYLOPRIM) 300 MG tablet Take 1 tablet (300 mg total) by mouth daily. 90 tablet 3  . amLODipine (NORVASC) 2.5 MG tablet Take 1 tablet (2.5 mg total) by mouth daily. 30 tablet 0  . aspirin 81 MG tablet Take 162 mg by mouth daily.    Marland Kitchen atorvastatin (LIPITOR) 40 MG tablet Take 1 tablet (40 mg total) by mouth daily at 6 PM. 90 tablet 3  . empagliflozin (JARDIANCE) 25 MG TABS tablet Take 25 mg by mouth daily. 30 tablet 2  . indapamide (LOZOL) 1.25 MG tablet Take 1 tablet (1.25 mg total) by mouth every other day. 90 tablet 3  . latanoprost (XALATAN) 0.005 % ophthalmic solution     . polyethylene glycol (MIRALAX / GLYCOLAX) packet Take 17 g by mouth daily as needed.    . sitaGLIPtin-metformin (JANUMET) 50-1000 MG tablet Take 1 tablet by mouth 2 (two)  times daily. 180 tablet 3   No current facility-administered medications on file prior to visit.    Allergies  Allergen Reactions  . Ace Inhibitors Other (See Comments)    angioedema    Recent Results (from the past 2160 hour(s))  POCT HgB A1C     Status: None   Collection Time: 08/27/15 10:42 AM  Result Value Ref Range   Hemoglobin A1C 6.6   POCT Glucose (CBG)     Status: Abnormal   Collection Time: 08/27/15 10:42 AM  Result Value Ref Range   POC Glucose 177 (A) 70 - 99 mg/dl  Comprehensive Metabolic Panel (CMET)     Status: Abnormal   Collection Time: 08/27/15 11:58 AM  Result Value Ref Range   Glucose 168 (H) 65 - 99 mg/dL   BUN 21 8 - 27 mg/dL   Creatinine, Ser 0.95 0.76 - 1.27 mg/dL   GFR calc non Af Amer 79 >59 mL/min/1.73   GFR calc Af Amer 91 >59 mL/min/1.73   BUN/Creatinine Ratio 22 10 - 24   Sodium 135 134 - 144 mmol/L   Potassium 5.1 3.5 - 5.2 mmol/L   Chloride 97 96 - 106 mmol/L   CO2 21 18 - 29 mmol/L   Calcium 9.6 8.6 - 10.2 mg/dL   Total Protein 7.8 6.0 - 8.5 g/dL   Albumin 3.9 3.5 - 4.8 g/dL   Globulin, Total 3.9 1.5 - 4.5 g/dL   Albumin/Globulin Ratio 1.0 (L) 1.2 - 2.2  Bilirubin Total 0.3 0.0 - 1.2 mg/dL   Alkaline Phosphatase 45 39 - 117 IU/L   AST 24 0 - 40 IU/L   ALT 24 0 - 44 IU/L  CBC     Status: Abnormal   Collection Time: 08/27/15 11:58 AM  Result Value Ref Range   WBC 7.1 3.4 - 10.8 x10E3/uL   RBC 4.45 4.14 - 5.80 x10E6/uL   Hemoglobin 12.8 12.6 - 17.7 g/dL   Hematocrit 38.6 37.5 - 51.0 %   MCV 87 79 - 97 fL   MCH 28.8 26.6 - 33.0 pg   MCHC 33.2 31.5 - 35.7 g/dL   RDW 17.5 (H) 12.3 - 15.4 %   Platelets 282 150 - 379 x10E3/uL  Urine Microalbumin w/creat. ratio     Status: Abnormal   Collection Time: 08/27/15 11:58 AM  Result Value Ref Range   Creatinine, Urine 102.8 Not Estab. mg/dL   Microalbum.,U,Random 75.9 Not Estab. ug/mL   MICROALB/CREAT RATIO 73.8 (H) 0.0 - 30.0 mg/g creat  Uric acid     Status: None   Collection Time:  08/27/15 11:58 AM  Result Value Ref Range   Uric Acid 5.1 3.7 - 8.6 mg/dL    Comment:            Therapeutic target for gout patients: <6.0  Vitamin D (25 hydroxy)     Status: None   Collection Time: 08/27/15 11:58 AM  Result Value Ref Range   Vit D, 25-Hydroxy 50.6 30.0 - 100.0 ng/mL    Comment: Vitamin D deficiency has been defined by the [place] and an Endocrine Society practice guideline as a level of serum 25-OH vitamin D less than 20 ng/mL (1,2). The Endocrine Society went on to further define vitamin D insufficiency as a level between 21 and 29 ng/mL (2). 1. IOM (Institute of Medicine). 2010. Dietary reference    intakes for calcium and D. [place]: The    [employer]. 2. Holick MF, Binkley [place], Bischoff-Ferrari HA, et al.    Evaluation, treatment, and prevention of vitamin D    deficiency: an Endocrine Society clinical practice    guideline. JCEM. 2011 Jul; 96(7):1911-30.   B12     Status: None   Collection Time: 08/27/15 11:58 AM  Result Value Ref Range   Vitamin B-12 482 211 - 946 pg/mL  Lipid Profile     Status: None   Collection Time: 08/27/15 11:58 AM  Result Value Ref Range   Cholesterol, Total 131 100 - 199 mg/dL   Triglycerides 79 0 - 149 mg/dL   HDL 40 >39 mg/dL   VLDL Cholesterol Cal 16 5 - 40 mg/dL   LDL Calculated 75 0 - 99 mg/dL   Chol/HDL Ratio 3.3 0.0 - 5.0 ratio units    Comment:                                   T. Chol/HDL Ratio                                             Men  Women                               1/2 Avg.Risk  3.4  3.3                                   Avg.Risk  5.0    4.4                                2X Avg.Risk  9.6    7.1                                3X Avg.Risk 23.4   11.0     Objective: General: Patient is awake, alert, and oriented x 3 and in no acute distress.  Integument: Skin is warm, dry and supple bilateral. Nails are elongated and mildly dystrophic. No signs of infection. No open  lesions present bilateral + Callus at right 2nd toe medial aspect, right plantar hallux and left plantar medial hallux minimal with no signs of infection. Remaining integument unremarkable.  Vasculature:  Dorsalis Pedis pulse 2/4 bilateral. Posterior Tibial pulse  1/4 bilateral. Capillary fill time <3 sec 1-5 bilateral. Scant hair growth to the level of the digits.Temperature gradient within normal limits. No varicosities present bilateral. No edema present bilateral.   Neurology: The patient has intact sensation measured with a 5.07/10g Semmes Weinstein Monofilament at all pedal sites bilateral . Vibratory sensation intact bilateral with tuning fork. No Babinski sign present bilateral.   Musculoskeletal: No symptomatic pedal deformities noted bilateral. Muscular strength 5/5 in all lower extremity muscular groups bilateral without pain on range of motion . No tenderness with calf compression bilateral.  Assessment and Plan: Problem List Items Addressed This Visit    None    Visit Diagnoses    Dermatophytosis of nail    -  Primary   Type 2 diabetes mellitus without complication, unspecified long term insulin use status (HCC)       Corns and callosities       Right 2nd toe medial aspect   Foot pain, unspecified laterality          -Examined patient. -Discussed and educated patient on diabetic foot care, especially with  regards to the vascular, neurological and musculoskeletal systems.  -Stressed the importance of good glycemic control and the detriment of not  controlling glucose levels in relation to the foot. -Mechanically debrided callus x1 today at right 2nd toe medial aspect using sterile chisel blade and nails x 10 using sterile nail nipper without incident -Dispensed toe pad to use as instructed  -Answered all patient questions -Patient to return in 2.5 months for at risk foot care -Patient advised to call the office if any problems or questions arise in the meantime.  Landis Martins, DPM

## 2015-11-08 ENCOUNTER — Ambulatory Visit (INDEPENDENT_AMBULATORY_CARE_PROVIDER_SITE_OTHER): Payer: Medicare PPO | Admitting: Urology

## 2015-11-08 ENCOUNTER — Encounter: Payer: Self-pay | Admitting: Urology

## 2015-11-08 VITALS — BP 124/70 | HR 110 | Ht 69.0 in | Wt 292.6 lb

## 2015-11-08 DIAGNOSIS — N5089 Other specified disorders of the male genital organs: Secondary | ICD-10-CM | POA: Diagnosis not present

## 2015-11-08 DIAGNOSIS — N509 Disorder of male genital organs, unspecified: Secondary | ICD-10-CM

## 2015-11-08 DIAGNOSIS — N401 Enlarged prostate with lower urinary tract symptoms: Secondary | ICD-10-CM | POA: Diagnosis not present

## 2015-11-08 DIAGNOSIS — N138 Other obstructive and reflux uropathy: Secondary | ICD-10-CM

## 2015-11-08 LAB — BLADDER SCAN AMB NON-IMAGING: SCAN RESULT: 119

## 2015-11-08 NOTE — Progress Notes (Signed)
11/08/2015 3:12 PM   Jordan [name] 1940/10/24 [id number]  Referring provider: Ashok Norris, MD [address] [place],  [address]  Chief Complaint  Patient presents with  . Groin Swelling    Right referred by [facility]    HPI: Patient is a 75 year old African American male who is referred by Dr. Ancil Boozer for a right testicular mass.  Patient stated that he noted one month ago he had scrotal swelling.  He believes it is the result of oral sex.  He denies any scrotal pain, penile discharge, dysuria or scrotal redness.    He denies any scrotal trauma or history of GU malignancies.    He has not had any recent fevers, chills, nausea and/or vomiting.    He has baseline frequency and nocturia.  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    He specifically denies gross hematuria and suprapubic pain.    He has been having night sweats.  His IPSS score is 17/3.        IPSS    Row Name 11/08/15 1400         International Prostate Symptom Score   How often have you had the sensation of not emptying your bladder? Less than half the time     How often have you had to urinate less than every two hours? Less than half the time     How often have you found you stopped and started again several times when you urinated? Less than 1 in 5 times     How often have you found it difficult to postpone urination? Almost always     How often have you had a weak urinary stream? About half the time     How often have you had to strain to start urination? Not at All     How many times did you typically get up at night to urinate? 4 Times     Total IPSS Score 17       Quality of Life due to urinary symptoms   If you were to spend the rest of your life with your urinary condition just the way it is now how would you feel about that? Mixed        Score:  1-7 Mild 8-19 Moderate 20-35 Severe     PMH: Past Medical History:  Diagnosis Date  . Chronic kidney  disease, stage II (mild)   . Decreased libido   . Diabetes mellitus without complication ([place])   . Glaucoma   . Gout 2009  . Heart murmur   . Hernia [id number]  . Hyperlipidemia   . Hypertension   . Inguinal hernia without mention of obstruction or gangrene, unilateral or unspecified, (not specified as recurrent)   . Lumbago   . Obesity   . Unspecified constipation     Surgical History: Past Surgical History:  Procedure Laterality Date  . cataract surgery    . COLONOSCOPY  2012  . EYE SURGERY  2011   cataract  . UMBILICAL HERNIA REPAIR  2011    Home Medications:    Medication List       Accurate as of 11/08/15  3:12 PM. Always use your most recent med list.          allopurinol 300 MG tablet Commonly known as:  ZYLOPRIM Take 1 tablet (300 mg total) by mouth daily.   amLODipine 2.5 MG tablet Commonly known as:  NORVASC Take 1 tablet (2.5 mg total)  by mouth daily.   aspirin 81 MG tablet Take 162 mg by mouth daily.   atorvastatin 40 MG tablet Commonly known as:  LIPITOR Take 1 tablet (40 mg total) by mouth daily at 6 PM.   empagliflozin 25 MG Tabs tablet Commonly known as:  JARDIANCE Take 25 mg by mouth daily.   FISH OIL PO Take by mouth.   indapamide 1.25 MG tablet Commonly known as:  LOZOL Take 1 tablet (1.25 mg total) by mouth every other day.   latanoprost 0.005 % ophthalmic solution Commonly known as:  XALATAN   niacin 100 MG tablet Take 100 mg by mouth at bedtime.   polyethylene glycol packet Commonly known as:  MIRALAX / GLYCOLAX Take 17 g by mouth daily as needed.   sitaGLIPtin-metformin 50-1000 MG tablet Commonly known as:  JANUMET Take 1 tablet by mouth 2 (two) times daily.       Allergies:  Allergies  Allergen Reactions  . Ace Inhibitors Other (See Comments)    angioedema    Family History: Family History  Problem Relation Age of Onset  . Heart disease Father   . Hypertension Father   . Kidney disease Neg Hx   . Prostate  cancer Neg Hx     Social History:  reports that he has quit smoking. His smoking use included Cigarettes. He has a 10.00 pack-year smoking history. He has never used smokeless tobacco. He reports that he drinks alcohol. He reports that he does not use drugs.  ROS: UROLOGY Frequent Urination?: Yes Hard to postpone urination?: Yes Burning/pain with urination?: No Get up at night to urinate?: Yes Leakage of urine?: No Urine stream starts and stops?: Yes Trouble starting stream?: No Do you have to strain to urinate?: No Blood in urine?: No Urinary tract infection?: No Sexually transmitted disease?: No Injury to kidneys or bladder?: No Painful intercourse?: No Weak stream?: No Erection problems?: Yes Penile pain?: No  Gastrointestinal Nausea?: No Vomiting?: No Indigestion/heartburn?: No Diarrhea?: No Constipation?: Yes  Constitutional Fever: No Night sweats?: Yes Weight loss?: No Fatigue?: No  Skin Skin rash/lesions?: No Itching?: No  Eyes Blurred vision?: No Double vision?: No  Ears/Nose/Throat Sore throat?: No Sinus problems?: No  Hematologic/Lymphatic Swollen glands?: No Easy bruising?: No  Cardiovascular Leg swelling?: No Chest pain?: No  Respiratory Cough?: No Shortness of breath?: No  Endocrine Excessive thirst?: No  Musculoskeletal Back pain?: No Joint pain?: No  Neurological Headaches?: No Dizziness?: No  Psychologic Depression?: No Anxiety?: No  Physical Exam: BP 124/70   Pulse (!) 110   Ht 5\' 9"  (1.753 m)   Wt 292 lb 9.6 oz (132.7 kg)   BMI 43.21 kg/m   Constitutional: Well nourished. Alert and oriented, No acute distress. HEENT: [place] AT, moist mucus membranes. Trachea midline, no masses. Cardiovascular: No clubbing, cyanosis, or edema. Respiratory: Normal respiratory effort, no increased work of breathing. GI: Abdomen is soft, non tender, non distended, no abdominal masses. Liver and spleen not palpable.  No hernias  appreciated.  Stool sample for occult testing is not indicated.   GU: No CVA tenderness.  No bladder fullness or masses.  Patient with uncircumcised phallus. Foreskin easily retracted Urethral meatus is patent.  No penile discharge. No penile lesions or rashes. Scrotum without lesions, cysts, rashes and/or edema.  Testicles are located scrotally bilaterally. Right testicle is enlarged and firm.  Non tender.  No erythema.  No masses are appreciated in the testicles. Left and right epididymis are normal. Rectal: Patient with  normal sphincter tone. Anus and perineum without scarring or rashes. No rectal masses are appreciated. Prostate is approximately 55 grams, no nodules are appreciated. Seminal vesicles are normal. Skin: No rashes, bruises or suspicious lesions. Lymph: No cervical or inguinal adenopathy. Neurologic: Grossly intact, no focal deficits, moving all 4 extremities. Psychiatric: Normal mood and affect.  Laboratory Data: Lab Results  Component Value Date   WBC 7.1 08/27/2015   HCT 38.6 08/27/2015   MCV 87 08/27/2015   PLT 282 08/27/2015    Lab Results  Component Value Date   CREATININE 0.95 08/27/2015    Lab Results  Component Value Date   HGBA1C 6.6 08/27/2015    Lab Results  Component Value Date   TSH 1.810 11/07/2014       Component Value Date/Time   CHOL 131 08/27/2015 1158   HDL 40 08/27/2015 1158   CHOLHDL 3.3 08/27/2015 1158   LDLCALC 75 08/27/2015 1158    Lab Results  Component Value Date   AST 24 08/27/2015   Lab Results  Component Value Date   ALT 24 08/27/2015   Pertinent imaging Results for LEOR, DINSE (MRN [id number]) as of 11/08/2015 15:42  Ref. Range 11/08/2015 14:51  Scan Result Unknown 119   Assessment & Plan:    1. Scrotal mass  - worrisome for malignancy  - schedule scrotal ultrasound to evaluate the internal structures  - RTC for report   2. BPH with LUTS  - IPSS score is 17/3  - Continue conservative management,  avoiding bladder irritants and timed voiding's  - BLADDER SCAN AMB NON-IMAGING  - address further when patient returns for scrotal ultrasound report   Return for scrotal ultrasound report.  These notes generated with voice recognition software. I apologize for typographical errors.  Zara Council, [facility] [address], [place], [place] [address] [phone]

## 2015-11-20 ENCOUNTER — Other Ambulatory Visit: Payer: Self-pay | Admitting: Family Medicine

## 2015-11-20 ENCOUNTER — Telehealth: Payer: Self-pay | Admitting: Family Medicine

## 2015-11-20 DIAGNOSIS — I10 Essential (primary) hypertension: Secondary | ICD-10-CM

## 2015-11-20 MED ORDER — AMLODIPINE BESYLATE 2.5 MG PO TABS: 2.5000 mg | ORAL_TABLET | Freq: Every day | ORAL | 0 refills | Status: DC

## 2015-11-20 NOTE — Telephone Encounter (Signed)
He should have refills of Jardiance

## 2015-11-21 ENCOUNTER — Other Ambulatory Visit: Payer: Self-pay | Admitting: Family Medicine

## 2015-11-21 ENCOUNTER — Ambulatory Visit: Payer: Medicare PPO

## 2015-11-21 DIAGNOSIS — I10 Essential (primary) hypertension: Secondary | ICD-10-CM

## 2015-11-21 NOTE — Telephone Encounter (Signed)
Patient requesting refill of Amlodipine be sent to [facility].

## 2015-11-22 ENCOUNTER — Ambulatory Visit
Admission: RE | Admit: 2015-11-22 | Discharge: 2015-11-22 | Disposition: A | Discharge status: Home / Self Care | Payer: Medicare PPO | Source: Ambulatory Visit | Attending: Urology | Admitting: Urology

## 2015-11-22 DIAGNOSIS — N433 Hydrocele, unspecified: Secondary | ICD-10-CM | POA: Insufficient documentation

## 2015-11-22 DIAGNOSIS — K4090 Unilateral inguinal hernia, without obstruction or gangrene, not specified as recurrent: Secondary | ICD-10-CM | POA: Insufficient documentation

## 2015-11-22 DIAGNOSIS — N503 Cyst of epididymis: Secondary | ICD-10-CM | POA: Insufficient documentation

## 2015-11-22 DIAGNOSIS — N5089 Other specified disorders of the male genital organs: Secondary | ICD-10-CM

## 2015-11-22 DIAGNOSIS — N509 Disorder of male genital organs, unspecified: Secondary | ICD-10-CM | POA: Diagnosis present

## 2015-11-23 ENCOUNTER — Encounter: Payer: Self-pay | Admitting: Urology

## 2015-11-23 ENCOUNTER — Ambulatory Visit (INDEPENDENT_AMBULATORY_CARE_PROVIDER_SITE_OTHER): Payer: Medicare PPO | Admitting: Urology

## 2015-11-23 VITALS — BP 134/76 | HR 86 | Ht 69.0 in | Wt 299.9 lb

## 2015-11-23 DIAGNOSIS — N509 Disorder of male genital organs, unspecified: Secondary | ICD-10-CM

## 2015-11-23 DIAGNOSIS — N401 Enlarged prostate with lower urinary tract symptoms: Secondary | ICD-10-CM

## 2015-11-23 DIAGNOSIS — N138 Other obstructive and reflux uropathy: Secondary | ICD-10-CM

## 2015-11-23 DIAGNOSIS — N5089 Other specified disorders of the male genital organs: Secondary | ICD-10-CM

## 2015-11-23 MED ORDER — SULFAMETHOXAZOLE-TRIMETHOPRIM 800-160 MG PO TABS: 1.0000 | ORAL_TABLET | Freq: Two times a day (BID) | ORAL | 0 refills | Status: DC

## 2015-11-23 NOTE — Progress Notes (Signed)
11/23/2015 9:41 AM   Jordan [name] 06-17-1940 [id number]  Referring provider: Ashok Norris, MD [address] [place], [place] [address]  Chief Complaint  Patient presents with  . Results    [name]    HPI: Patient is a 75 year old African American male who presents today for a scrotal ultrasound results for a right testicular mass.  Background history Patient was referred by Dr. Ancil Boozer for a right testicular mass.  Patient stated that he noted one month ago he had scrotal swelling.  He believes it is the result of oral sex.  He denies any scrotal pain, penile discharge, dysuria or scrotal redness.  He denies any scrotal trauma or history of GU malignancies.  He has not had any recent fevers, chills, nausea and/or vomiting.  He has baseline frequency and nocturia.  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  He specifically denies gross hematuria and suprapubic pain.  He has been having night sweats.  His IPSS score was 17/3.        IPSS    Row Name 11/08/15 1400         International Prostate Symptom Score   How often have you had the sensation of not emptying your bladder? Less than half the time     How often have you had to urinate less than every two hours? Less than half the time     How often have you found you stopped and started again several times when you urinated? Less than 1 in 5 times     How often have you found it difficult to postpone urination? Almost always     How often have you had a weak urinary stream? About half the time     How often have you had to strain to start urination? Not at All     How many times did you typically get up at night to urinate? 4 Times     Total IPSS Score 17       Quality of Life due to urinary symptoms   If you were to spend the rest of your life with your urinary condition just the way it is now how would you feel about that? Mixed        Score:  1-7 Mild 8-19 Moderate 20-35 Severe   Today, he states  he is not having any scrotal pain. He is still continuing to have frequent urination and urgency. He is denied fevers, chills, nausea and vomiting. He feels that the scrotal swelling has decreased since his appointment with [name] 2 weeks ago.  Scrotal ultrasound performed on 11/22/2015 noted bilateral hydroceles right significantly greater than left with some septations within. This is likely the etiology of the fullness in the right scrotum.   Scrotal thickening is noted which may be related some localized cellulitis. Correlation with physical exam is recommended. Scattered left epididymal cysts. Fat containing left inguinal hernia.  Personally reviewed the films with the patient and with Dr. Pilar Jarvis.     PMH: Past Medical History:  Diagnosis Date  . Chronic kidney disease, stage II (mild)   . Decreased libido   . Diabetes mellitus without complication ([place])   . Glaucoma   . Gout 2009  . Heart murmur   . Hernia [id number]  . Hyperlipidemia   . Hypertension   . Inguinal hernia without mention of obstruction or gangrene, unilateral or unspecified, (not specified as recurrent)   . Lumbago   . Obesity   .  Unspecified constipation     Surgical History: Past Surgical History:  Procedure Laterality Date  . cataract surgery    . COLONOSCOPY  2012  . EYE SURGERY  2011   cataract  . UMBILICAL HERNIA REPAIR  2011    Home Medications:    Medication List       Accurate as of 11/23/15  9:41 AM. Always use your most recent med list.          allopurinol 300 MG tablet Commonly known as:  ZYLOPRIM Take 1 tablet (300 mg total) by mouth daily.   amLODipine 2.5 MG tablet Commonly known as:  NORVASC Take 1 tablet (2.5 mg total) by mouth daily.   amLODipine 2.5 MG tablet Commonly known as:  NORVASC TAKE ONE TABLET BY MOUTH ONCE DAILY   aspirin 81 MG tablet Take 162 mg by mouth daily.   atorvastatin 40 MG tablet Commonly known as:  LIPITOR Take 1 tablet (40 mg total) by mouth daily  at 6 PM.   empagliflozin 25 MG Tabs tablet Commonly known as:  JARDIANCE Take 25 mg by mouth daily.   FISH OIL PO Take by mouth.   indapamide 1.25 MG tablet Commonly known as:  LOZOL Take 1 tablet (1.25 mg total) by mouth every other day.   latanoprost 0.005 % ophthalmic solution Commonly known as:  XALATAN   niacin 100 MG tablet Take 100 mg by mouth at bedtime.   polyethylene glycol packet Commonly known as:  MIRALAX / GLYCOLAX Take 17 g by mouth daily as needed.   sitaGLIPtin-metformin 50-1000 MG tablet Commonly known as:  JANUMET Take 1 tablet by mouth 2 (two) times daily.   sulfamethoxazole-trimethoprim 800-160 MG tablet Commonly known as:  BACTRIM DS,SEPTRA DS Take 1 tablet by mouth every 12 (twelve) hours.       Allergies:  Allergies  Allergen Reactions  . Ace Inhibitors Other (See Comments)    angioedema    Family History: Family History  Problem Relation Age of Onset  . Heart disease Father   . Hypertension Father   . Kidney disease Neg Hx   . Prostate cancer Neg Hx     Social History:  reports that he has quit smoking. His smoking use included Cigarettes. He has a 10.00 pack-year smoking history. He has never used smokeless tobacco. He reports that he drinks alcohol. He reports that he does not use drugs.  ROS: UROLOGY Frequent Urination?: Yes Hard to postpone urination?: Yes Burning/pain with urination?: No Get up at night to urinate?: Yes Leakage of urine?: No Urine stream starts and stops?: No Trouble starting stream?: No Do you have to strain to urinate?: No Blood in urine?: No Urinary tract infection?: No Sexually transmitted disease?: No Injury to kidneys or bladder?: No Painful intercourse?: No Weak stream?: No Erection problems?: Yes Penile pain?: No  Gastrointestinal Nausea?: No Vomiting?: No Indigestion/heartburn?: No Diarrhea?: No Constipation?: No  Constitutional Fever: No Night sweats?: No Weight loss?:  No Fatigue?: No  Skin Skin rash/lesions?: No Itching?: No  Eyes Blurred vision?: No Double vision?: No  Ears/Nose/Throat Sore throat?: No Sinus problems?: No  Hematologic/Lymphatic Swollen glands?: No Easy bruising?: No  Cardiovascular Leg swelling?: No Chest pain?: No  Respiratory Cough?: No Shortness of breath?: No  Endocrine Excessive thirst?: No  Musculoskeletal Back pain?: No Joint pain?: No  Neurological Headaches?: No Dizziness?: No  Psychologic Depression?: No Anxiety?: No  Physical Exam: BP 134/76   Pulse 86   Ht 5\' 9"  (1.753 m)  Wt 299 lb 14.4 oz (136 kg)   BMI 44.29 kg/m   Constitutional: Well nourished. Alert and oriented, No acute distress. HEENT: [place] AT, moist mucus membranes. Trachea midline, no masses. Cardiovascular: No clubbing, cyanosis, or edema. Respiratory: Normal respiratory effort, no increased work of breathing. GI: Abdomen is soft, non tender, non distended, no abdominal masses. Liver and spleen not palpable.  No hernias appreciated.  Stool sample for occult testing is not indicated.   GU: No CVA tenderness.  No bladder fullness or masses.  Patient with uncircumcised phallus. Foreskin easily retracted Urethral meatus is patent.  No penile discharge. No penile lesions or rashes. Scrotum without lesions, cysts, rashes and/or edema.  Testicles are located scrotally bilaterally. Right testicle is enlarged and firm.  Non tender.  No erythema.  No masses are appreciated in the testicles. Left and right epididymis are normal. Rectal: Patient with  normal sphincter tone. Anus and perineum without scarring or rashes. No rectal masses are appreciated. Prostate is approximately 55 grams, no nodules are appreciated. Seminal vesicles are normal. Skin: No rashes, bruises or suspicious lesions. Lymph: No cervical or inguinal adenopathy. Neurologic: Grossly intact, no focal deficits, moving all 4 extremities. Psychiatric: Normal mood and  affect.  Laboratory Data: Lab Results  Component Value Date   WBC 7.1 08/27/2015   HCT 38.6 08/27/2015   MCV 87 08/27/2015   PLT 282 08/27/2015    Lab Results  Component Value Date   CREATININE 0.95 08/27/2015    Lab Results  Component Value Date   HGBA1C 6.6 08/27/2015    Lab Results  Component Value Date   TSH 1.810 11/07/2014       Component Value Date/Time   CHOL 131 08/27/2015 1158   HDL 40 08/27/2015 1158   CHOLHDL 3.3 08/27/2015 1158   LDLCALC 75 08/27/2015 1158    Lab Results  Component Value Date   AST 24 08/27/2015   Lab Results  Component Value Date   ALT 24 08/27/2015   Pertinent imaging Results for SAHAND, SPOSITO (MRN [id number]) as of 11/08/2015 15:42  Ref. Range 11/08/2015 14:51  Scan Result Unknown 119   CLINICAL DATA:  Right-sided scrotal less fell 1 month, history of prior annual hernia repair  EXAM: SCROTAL ULTRASOUND  DOPPLER ULTRASOUND OF THE TESTICLES  TECHNIQUE: Complete ultrasound examination of the testicles, epididymis, and other scrotal structures was performed. Color and spectral Doppler ultrasound were also utilized to evaluate blood flow to the testicles.  COMPARISON:  None.  FINDINGS: Right testicle  Measurements: 3.5 x 2.5 x 3.0 cm. No focal mass lesion is noted. Mild heterogeneity is seen. Note is made of scrotal thickening on the right  Left testicle  Measurements: 4.2 x 2.4 x 2.8 cm. No mass or microlithiasis visualized.  Right epididymis: Scattered calcifications are seen of uncertain significance.  Left epididymis: Scattered cysts are noted. The largest of these measures 1.8 cm.  Hydrocele: Prominent right hydrocele is noted with some septations within. This is likely the etiology of the enlarging right scrotum. Smaller left hydrocele is noted.  Varicocele:  None visualized.  Pulsed Doppler interrogation of both testes demonstrates normal low resistance arterial and venous  waveforms bilaterally.  Incidental note is made of fat containing left inguinal hernia stable from prior CT in 2014.  IMPRESSION: Bilateral hydroceles right significantly greater than left with some septations within. This is likely the etiology of the fullness in the right scrotum.  Scrotal thickening is noted which may be related some localized cellulitis.  Correlation with physical exam is recommended.  Scattered left epididymal cysts.  Fat containing left inguinal hernia.   Electronically Signed   By: Inez Catalina M.D.   On: 11/23/2015 08:48  Assessment & Plan:    1. Scrotal mass  - no change in right testicular firmness on exam  - discussed with Dr. Pilar Jarvis and has decided to treat with antibiotics and return for exam  - testicular tumor makers are drawn  - Septra DS, twice daily for two weeks   2. BPH with LUTS  - IPSS score is 17/3, no change since last visit  - Continue conservative management, avoiding bladder irritants and timed voiding's  - address further when patient returns after completing antibiotics   Return in about 2 weeks (around 12/07/2015) for recheck.  These notes generated with voice recognition software. I apologize for typographical errors.  Zara Council, [facility] [address] [place], [place] [address] [phone]

## 2015-11-24 LAB — LACTATE DEHYDROGENASE: LDH: 144 IU/L (ref 121–224)

## 2015-11-24 LAB — AFP TUMOR MARKER: AFP TUMOR MARKER: 3.7 ng/mL (ref 0.0–8.3)

## 2015-11-24 LAB — BETA HCG QUANT (REF LAB): hCG Quant: <1 m[IU]/mL (ref 0–3)

## 2015-11-27 ENCOUNTER — Encounter: Payer: Self-pay | Admitting: Family Medicine

## 2015-11-27 ENCOUNTER — Ambulatory Visit (INDEPENDENT_AMBULATORY_CARE_PROVIDER_SITE_OTHER): Payer: Medicare PPO | Admitting: Family Medicine

## 2015-11-27 VITALS — BP 138/76 | HR 107 | Temp 97.9°F | Resp 18 | Ht 69.0 in | Wt 298.7 lb

## 2015-11-27 DIAGNOSIS — I10 Essential (primary) hypertension: Secondary | ICD-10-CM | POA: Diagnosis not present

## 2015-11-27 DIAGNOSIS — Z23 Encounter for immunization: Secondary | ICD-10-CM | POA: Diagnosis not present

## 2015-11-27 DIAGNOSIS — N521 Erectile dysfunction due to diseases classified elsewhere: Secondary | ICD-10-CM

## 2015-11-27 DIAGNOSIS — N509 Disorder of male genital organs, unspecified: Secondary | ICD-10-CM

## 2015-11-27 DIAGNOSIS — R809 Proteinuria, unspecified: Secondary | ICD-10-CM

## 2015-11-27 DIAGNOSIS — N5089 Other specified disorders of the male genital organs: Secondary | ICD-10-CM | POA: Insufficient documentation

## 2015-11-27 DIAGNOSIS — N433 Hydrocele, unspecified: Secondary | ICD-10-CM | POA: Diagnosis not present

## 2015-11-27 DIAGNOSIS — K439 Ventral hernia without obstruction or gangrene: Secondary | ICD-10-CM | POA: Insufficient documentation

## 2015-11-27 DIAGNOSIS — M1000 Idiopathic gout, unspecified site: Secondary | ICD-10-CM | POA: Diagnosis not present

## 2015-11-27 DIAGNOSIS — E1165 Type 2 diabetes mellitus with hyperglycemia: Secondary | ICD-10-CM

## 2015-11-27 DIAGNOSIS — E1140 Type 2 diabetes mellitus with diabetic neuropathy, unspecified: Secondary | ICD-10-CM

## 2015-11-27 DIAGNOSIS — IMO0002 Reserved for concepts with insufficient information to code with codable children: Secondary | ICD-10-CM

## 2015-11-27 DIAGNOSIS — E1129 Type 2 diabetes mellitus with other diabetic kidney complication: Secondary | ICD-10-CM | POA: Diagnosis not present

## 2015-11-27 DIAGNOSIS — E785 Hyperlipidemia, unspecified: Secondary | ICD-10-CM | POA: Diagnosis not present

## 2015-11-27 LAB — POCT GLYCOSYLATED HEMOGLOBIN (HGB A1C): HEMOGLOBIN A1C: 7.8

## 2015-11-27 MED ORDER — AMLODIPINE BESYLATE 2.5 MG PO TABS: 2.5000 mg | ORAL_TABLET | Freq: Every day | ORAL | 2 refills | Status: DC

## 2015-11-27 MED ORDER — EMPAGLIFLOZIN 25 MG PO TABS: 25.0000 mg | ORAL_TABLET | Freq: Every day | ORAL | 1 refills | Status: DC

## 2015-11-27 NOTE — Progress Notes (Signed)
Name: Jordan [name]   MRN: [id number]    DOB: [birth year]   Date:11/27/2015       Progress Note  Subjective  Chief Complaint  Chief Complaint  Patient presents with  . Medication Refill    3 month F/U  . Diabetes    Patient does not check sugar daily, Patient goes to Podiatry at [facility] for DM [place]  . Hypertension  . Hyperlipidemia  . Constipation    Patient has a bowel movement every other day and if it goes longer than that he will take Miralax.     HPI  Angioedema : seen by Dr. Vicente Masson in June, and medication adjusted and Quinapril increased to two pills daily, he states since than he has developed 3 episodes of angioedema. First one was the tongue that swell up, second time it was the lip. 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 Vicente Masson in June, currently only on Janumet and he states he thinks glucose is getting higher, having urinary frequency, feeling more hungry, we started him on Jardiance in 10/2015. He states glucose seems to be improving since. HgbA1C is 7.5% today, he will resume a diabetic diet, he has not been very compliant lately - he has been drinking sweet tea.   HTN: well controlled with medication , no chest pain or palpitation   Hyperlipidemia: taking lipitor and denies side effects, no myalgia.   Constipation: controlled with MIralax, bowel movements almost daily, no straining or blood in stools.   Patient Active Problem List   Diagnosis Date Noted  . Bilateral hydrocele 11/27/2015   . Mass, scrotum 11/27/2015  . Controlled type 2 diabetes mellitus with microalbuminuria ([place]) 10/26/2015  . 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 Hyperlipidemia 11/07/2014  . Essential hypertension 11/07/2014  . Obesity, Class III, BMI 40-49.9 (morbid obesity) ([place]) 11/07/2014  . Gout 11/07/2014  . 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  . Epistaxis 09/21/2014  . 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 Heart murmur 12/02/2012  . Constipation 10/26/2012  . Incisional hernia, without obstruction or gangrene 10/26/2012  . Left inguinal hernia     Past Surgical History:  Procedure Laterality Date  . cataract surgery    . COLONOSCOPY  2012  . EYE SURGERY  2011   cataract  . UMBILICAL HERNIA REPAIR  2011    Family History  Problem Relation Age of Onset  . Heart disease Father   . Hypertension Father   . Kidney disease Neg Hx   . Prostate cancer Neg Hx     Social History   Social History  . Marital status: Widowed    Spouse name: N/A  . Number of children: N/A  . Years of education: N/A   Occupational History  . Not on file.   Social History Main Topics  . Smoking status: Former Smoker    Packs/day: 1.00    Years: 10.00    Types: Cigarettes    Start date: 03/17/1965  Quit date: 11/27/1975  . Smokeless tobacco: Never Used     Comment: quit 40 years  . Alcohol use 0.0 oz/week     Comment: socially  . Drug use: No  . Sexual activity: Yes    Partners: Female   Other Topics Concern  . Not on file   Social History Narrative  . No narrative on file     Current Outpatient Prescriptions:  .  allopurinol (ZYLOPRIM) 300 MG tablet, Take 1 tablet (300 mg total) by mouth daily., Disp: 90 tablet, Rfl: 3 .  amLODipine (NORVASC) 2.5 MG tablet, Take 1 tablet (2.5 mg total) by mouth daily., Disp: 90 tablet, Rfl: 2 .  aspirin 81 MG tablet, Take 162 mg by mouth daily., Disp: , Rfl:  .  atorvastatin (LIPITOR) 40 MG tablet,  Take 1 tablet (40 mg total) by mouth daily at 6 PM., Disp: 90 tablet, Rfl: 3 .  empagliflozin (JARDIANCE) 25 MG TABS tablet, Take 25 mg by mouth daily., Disp: 90 tablet, Rfl: 1 .  indapamide (LOZOL) 1.25 MG tablet, Take 1 tablet (1.25 mg total) by mouth every other day., Disp: 90 tablet, Rfl: 3 .  latanoprost (XALATAN) 0.005 % ophthalmic solution, , Disp: , Rfl:  .  niacin 100 MG tablet, Take 100 mg by mouth at bedtime., Disp: , Rfl:  .  Omega-3 Fatty Acids (FISH OIL PO), Take by mouth., Disp: , Rfl:  .  polyethylene glycol (MIRALAX / GLYCOLAX) packet, Take 17 g by mouth daily as needed., Disp: , Rfl:  .  sitaGLIPtin-metformin (JANUMET) 50-1000 MG tablet, Take 1 tablet by mouth 2 (two) times daily., Disp: 180 tablet, Rfl: 3 .  sulfamethoxazole-trimethoprim (BACTRIM DS,SEPTRA DS) 800-160 MG tablet, Take 1 tablet by mouth every 12 (twelve) hours., Disp: 28 tablet, Rfl: 0  Allergies  Allergen Reactions  . Ace Inhibitors Other (See Comments)    angioedema     ROS  Ten systems reviewed and is negative except as mentioned in HPI   Objective  Vitals:   11/27/15 1033  BP: 138/76  Pulse: (!) 107  Resp: 18  Temp: 97.9 F (36.6 C)  TempSrc: Oral  SpO2: 98%  Weight: 298 lb 11.2 oz (135.5 kg)  Height: 5\' 9"  (1.753 m)    Body mass index is 44.11 kg/m.  Physical Exam  Constitutional: Patient appears well-developed and well-nourished. Obese  No distress.  HEENT: head atraumatic, normocephalic, pupils equal and reactive to light, neck supple, throat within normal limits Cardiovascular: Normal rate, regular rhythm and normal heart sounds.  No murmur heard. No BLE edema. Pulmonary/Chest: Effort normal and breath sounds normal. No respiratory distress. Abdominal: Soft.  There is no tenderness. Large ventral hernia, partially reducible Psychiatric: Patient has a normal mood and affect. behavior is normal. Judgment and thought content normal.   Recent Results (from the past 2160 hour(s))   BLADDER SCAN AMB NON-IMAGING     Status: None   Collection Time: 11/08/15  2:51 PM  Result Value Ref Range   Scan Result 119   AFP tumor marker     Status: None   Collection Time: 11/23/15  9:50 AM  Result Value Ref Range   AFP-Tumor Marker 3.7 0.0 - 8.3 ng/mL    Comment: Roche ECLIA methodology  Lactate Dehydrogenase     Status: None   Collection Time: 11/23/15  9:50 AM  Result Value Ref Range   LDH 144 121 - 224 IU/L  Beta HCG, Quant (tumor marker)     Status: None  Collection Time: 11/23/15  9:50 AM  Result Value Ref Range   hCG Quant <1 0 - 3 mIU/mL    Comment: Roche ECLIA methodology  POCT HgB A1C     Status: Abnormal   Collection Time: 11/27/15 10:38 AM  Result Value Ref Range   Hemoglobin A1C 7.8       PHQ2/9: Depression screen [facility] 2/9 11/27/2015 10/26/2015 09/21/2014  Decreased Interest 0 1 0  Down, Depressed, Hopeless 0 0 0  PHQ - 2 Score 0 1 0     Fall Risk: Fall Risk  11/27/2015 10/26/2015 11/07/2014 09/21/2014  Falls in the past year? Yes Yes No No  Number falls in past yr: 1 2 or more - -  Injury with Fall? No No - -  Risk for fall due to : - History of fall(s);Medication side effect - -  Follow up - Falls evaluation completed - -      Functional Status Survey: Is the patient deaf or have difficulty hearing?: No Does the patient have difficulty seeing, even when wearing glasses/contacts?: No Does the patient have difficulty concentrating, remembering, or making decisions?: No Does the patient have difficulty walking or climbing stairs?: No Does the patient have difficulty dressing or bathing?: No Does the patient have difficulty doing errands alone such as visiting a doctor's office or shopping?: No   Assessment & Plan  1. 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  Dispense: 90 tablet; Refill: 1  2. Needs flu  shot  - Flu vaccine HIGH DOSE PF (Fluzone High dose)  3. Essential hypertension  - amLODipine (NORVASC) 2.5 MG tablet; Take 1 tablet (2.5 mg total) by mouth daily.  Dispense: 90 tablet; Refill: 2  4. Hyperlipidemia  Doing well on medication   5. Idiopathic gout, unspecified chronicity, unspecified site  Continue medication   6. Obesity, Class III, BMI 40-49.9 (morbid obesity) ([place])  Discussed with the patient the risk posed by an increased BMI. Discussed importance of portion control, calorie counting and at least 150 minutes of physical activity weekly. Avoid sweet beverages and drink more water. Eat at least 6 servings of fruit and vegetables daily   7. Bilateral hydrocele   8. Mass, scrotum  Continue follow up with [facility]  9. Uncontrolled type 2 diabetes mellitus with microalbuminuria, without long-term current use of insulin (HCC)  - empagliflozin (JARDIANCE) 25 MG TABS tablet; Take 25 mg by mouth daily.  Dispense: 90 tablet; Refill: 1

## 2015-11-29 ENCOUNTER — Ambulatory Visit: Payer: Medicare PPO | Admitting: Urology

## 2015-12-21 ENCOUNTER — Other Ambulatory Visit: Payer: Self-pay | Admitting: Family Medicine

## 2015-12-24 ENCOUNTER — Ambulatory Visit: Payer: Medicare PPO | Admitting: Urology

## 2015-12-24 ENCOUNTER — Encounter: Payer: Self-pay | Admitting: Urology

## 2015-12-24 VITALS — BP 150/117 | HR 109 | Ht 69.0 in | Wt 298.6 lb

## 2015-12-24 DIAGNOSIS — R3129 Other microscopic hematuria: Secondary | ICD-10-CM | POA: Diagnosis not present

## 2015-12-24 DIAGNOSIS — N509 Disorder of male genital organs, unspecified: Secondary | ICD-10-CM | POA: Diagnosis not present

## 2015-12-24 DIAGNOSIS — N400 Enlarged prostate without lower urinary tract symptoms: Secondary | ICD-10-CM

## 2015-12-24 DIAGNOSIS — N5089 Other specified disorders of the male genital organs: Secondary | ICD-10-CM

## 2015-12-24 LAB — MICROSCOPIC EXAMINATION

## 2015-12-24 LAB — URINALYSIS, COMPLETE
BILIRUBIN UA: NEGATIVE
Ketones, UA: NEGATIVE
NITRITE UA: NEGATIVE
PH UA: 5 (ref 5.0–7.5)
PROTEIN UA: NEGATIVE
Specific Gravity, UA: 1.015 (ref 1.005–1.030)
UUROB: 0.2 mg/dL (ref 0.2–1.0)

## 2015-12-24 NOTE — Progress Notes (Signed)
12/24/2015 11:32 AM   Jordan [name] 23-Feb-1941 [id number]  Referring provider: Ashok Norris, MD [address] [place], [place] [address]  Chief Complaint  Patient presents with  . Follow-up    2 weeks scrotal mass/BPH    HPI: Patient is a 75 year old African American male who presents today for a recheck on his scrotal mass and BPH with LUTS.     Background history Patient was referred by Dr. Ancil [name] for a right testicular mass.  Patient stated that he noted one month ago he had scrotal swelling.  He believes it is the result of oral sex.  He denies any scrotal pain, penile discharge, dysuria or scrotal redness.  He denies any scrotal trauma or history of GU malignancies.  He has not had any recent fevers, chills, nausea and/or vomiting.  He has baseline frequency and nocturia.  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  He specifically denies gross hematuria and suprapubic pain.  He has been having night sweats.  He underwent a scrotal ultrasound on 11/22/2015 noted bilateral hydroceles right significantly greater than left with some septations within. This is likely the etiology of the fullness in the right scrotum.  Scrotal thickening is noted which may be related some localized cellulitis. Correlation with physical exam is recommended.  Scattered left epididymal cysts.  Fat containing left inguinal hernia.  His testicular tumor markers were normal.  He was placed on two weeks of Septra DS and returned today for symptom recheck.  He states that his scrotum has improved.    He is still experiencing frequency, urgency and nocturia.  His UA today demonstrates 3-10 RBC's.  He denies any dysuria, gross hematuria or suprapubic pain.  He has not had recent fevers, chills, nausea or vomiting.    He does not have a history of nephrolithiasis.     His IPSS score was 17/3.        IPSS    Row Name 11/08/15 1400         International Prostate Symptom Score   How  often have you had the sensation of not emptying your bladder? Less than half the time     How often have you had to urinate less than every two hours? Less than half the time     How often have you found you stopped and started again several times when you urinated? Less than 1 in 5 times     How often have you found it difficult to postpone urination? Almost always     How often have you had a weak urinary stream? About half the time     How often have you had to strain to start urination? Not at All     How many times did you typically get up at night to urinate? 4 Times     Total IPSS Score 17       Quality of Life due to urinary symptoms   If you were to spend the rest of your life with your urinary condition just the way it is now how would you feel about that? Mixed        Score:  1-7 Mild 8-19 Moderate 20-35 Severe       PMH: Past Medical History:  Diagnosis Date  . Chronic kidney disease, stage II (mild)   . Decreased libido   . Diabetes mellitus without complication ([place])   . Glaucoma   . Gout 2009  . Heart murmur   .  Hernia [id number]  . Hyperlipidemia   . Hypertension   . Inguinal hernia without mention of obstruction or gangrene, unilateral or unspecified, (not specified as recurrent)   . Lumbago   . Obesity   . Unspecified constipation     Surgical History: Past Surgical History:  Procedure Laterality Date  . cataract surgery    . COLONOSCOPY  2012  . EYE SURGERY  2011   cataract  . UMBILICAL HERNIA REPAIR  2011    Home Medications:    Medication List       Accurate as of 12/24/15 11:32 AM. Always use your most recent med list.          allopurinol 300 MG tablet Commonly known as:  ZYLOPRIM Take 1 tablet (300 mg total) by mouth daily.   amLODipine 2.5 MG tablet Commonly known as:  NORVASC Take 1 tablet (2.5 mg total) by mouth daily.   aspirin 81 MG tablet Take 162 mg by mouth daily.   atorvastatin 40 MG tablet Commonly known as:   LIPITOR Take 1 tablet (40 mg total) by mouth daily at 6 PM.   empagliflozin 25 MG Tabs tablet Commonly known as:  JARDIANCE Take 25 mg by mouth daily.   FISH OIL PO Take by mouth.   indapamide 1.25 MG tablet Commonly known as:  LOZOL Take 1 tablet (1.25 mg total) by mouth every other day.   latanoprost 0.005 % ophthalmic solution Commonly known as:  XALATAN   niacin 100 MG tablet Take 100 mg by mouth at bedtime.   polyethylene glycol packet Commonly known as:  MIRALAX / GLYCOLAX Take 17 g by mouth daily as needed.   sitaGLIPtin-metformin 50-1000 MG tablet Commonly known as:  JANUMET Take 1 tablet by mouth 2 (two) times daily.   sulfamethoxazole-trimethoprim 800-160 MG tablet Commonly known as:  BACTRIM DS,SEPTRA DS Take 1 tablet by mouth every 12 (twelve) hours.       Allergies:  Allergies  Allergen Reactions  . Ace Inhibitors Other (See Comments)    angioedema    Family History: Family History  Problem Relation Age of Onset  . Heart disease Father   . Hypertension Father   . Kidney disease Neg Hx   . Prostate cancer Neg Hx     Social History:  reports that he quit smoking about 40 years ago. His smoking use included Cigarettes. He started smoking about 50 years ago. He has a 10.00 pack-year smoking history. He has never used smokeless tobacco. He reports that he drinks alcohol. He reports that he does not use drugs.  ROS: UROLOGY Frequent Urination?: Yes Hard to postpone urination?: Yes Burning/pain with urination?: No Get up at night to urinate?: Yes Leakage of urine?: No Urine stream starts and stops?: No Trouble starting stream?: No Do you have to strain to urinate?: No Blood in urine?: No Urinary tract infection?: No Sexually transmitted disease?: No Injury to kidneys or bladder?: No Painful intercourse?: No Weak stream?: No Erection problems?: No Penile pain?: No  Gastrointestinal Nausea?: No Vomiting?: No Indigestion/heartburn?:  No Diarrhea?: No Constipation?: No  Constitutional Fever: No Night sweats?: Yes Weight loss?: No Fatigue?: No  Skin Skin rash/lesions?: No Itching?: No  Eyes Blurred vision?: No Double vision?: No  Ears/Nose/Throat Sore throat?: No Sinus problems?: No  Hematologic/Lymphatic Swollen glands?: No Easy bruising?: No  Cardiovascular Leg swelling?: No Chest pain?: No  Respiratory Cough?: No Shortness of breath?: No  Endocrine Excessive thirst?: No  Musculoskeletal Back pain?: No Joint pain?:  No  Neurological Headaches?: No Dizziness?: No  Psychologic Depression?: No Anxiety?: No  Physical Exam: BP (!) 150/117   Pulse (!) 109   Ht 5\' 9"  (1.753 m)   Wt 298 lb 9.6 oz (135.4 kg)   BMI 44.10 kg/m   Constitutional: Well nourished. Alert and oriented, No acute distress. HEENT: [place] AT, moist mucus membranes. Trachea midline, no masses. Cardiovascular: No clubbing, cyanosis, or edema. Respiratory: Normal respiratory effort, no increased work of breathing. GI: Abdomen is soft, non tender, non distended, no abdominal masses. Liver and spleen not palpable.  No hernias appreciated.  Stool sample for occult testing is not indicated.   GU: No CVA tenderness.  No bladder fullness or masses.  Patient with uncircumcised phallus. Foreskin easily retracted Urethral meatus is patent.  No penile discharge. No penile lesions or rashes. Scrotum without lesions, cysts, rashes and/or edema.  Testicles are located scrotally bilaterally. Right testicle is no longer enlarged and firm.  Non tender.  No erythema.  No masses are appreciated in the testicles. Left and right epididymis are normal. Rectal: Not performed today. Skin: No rashes, bruises or suspicious lesions. Lymph: No cervical or inguinal adenopathy. Neurologic: Grossly intact, no focal deficits, moving all 4 extremities. Psychiatric: Normal mood and affect.  Laboratory Data: Lab Results  Component Value Date   WBC 7.1  08/27/2015   HCT 38.6 08/27/2015   MCV 87 08/27/2015   PLT 282 08/27/2015    Lab Results  Component Value Date   CREATININE 0.95 08/27/2015    Lab Results  Component Value Date   HGBA1C 7.8 11/27/2015    Lab Results  Component Value Date   TSH 1.810 11/07/2014       Component Value Date/Time   CHOL 131 08/27/2015 1158   HDL 40 08/27/2015 1158   CHOLHDL 3.3 08/27/2015 1158   LDLCALC 75 08/27/2015 1158    Lab Results  Component Value Date   AST 24 08/27/2015   Lab Results  Component Value Date   ALT 24 08/27/2015    Assessment & Plan:    1. Scrotal mass  - resolved  2. BPH with LUTS  - IPSS score is 17/3, no change since last visit  - Continue conservative management, avoiding bladder irritants and timed voiding's  - patient need a hematuria work up pending urine culture results  3. Microscopic hematuria  - UA with 3-10 RBC's on today's visit  - urine sent for culture as he has had a recent infection  - if urine culture is negative, will pursue hematuria workup   Return for pending urine culture.  These notes generated with voice recognition software. I apologize for typographical errors.  Zara Council, [facility] [address], [place], [place] [address] [phone]

## 2015-12-28 LAB — CULTURE, URINE COMPREHENSIVE

## 2015-12-31 ENCOUNTER — Telehealth: Payer: Self-pay

## 2015-12-31 DIAGNOSIS — N390 Urinary tract infection, site not specified: Secondary | ICD-10-CM

## 2015-12-31 MED ORDER — LEVOFLOXACIN 500 MG PO TABS: 500.0000 mg | ORAL_TABLET | Freq: Every day | ORAL | 0 refills | Status: DC

## 2015-12-31 NOTE — Telephone Encounter (Signed)
-----   Message from Nori Riis, PA-C sent at 12/30/2015  3:02 PM EDT ----- Patient has a +UCx.  They need to start Levaquin 500 mg one tablet twice daily for seven days.  They also need to take a probiotic with the antibiotic course.  The dosage is listed below:  L. acidophilus and L. casei (25 x 109 CFU/day for 2 days, then 50 x 109 CFU/day for duration of the antibiotic course)  We will need to check an UA after he has finished his antibiotic to make sure the [facility] has resolved.

## 2015-12-31 NOTE — Telephone Encounter (Signed)
LMOM-medication sent to pharmacy 

## 2016-01-01 NOTE — Telephone Encounter (Signed)
LMOM

## 2016-01-01 NOTE — Telephone Encounter (Signed)
Pt called and I let him know he had an infection and there was an antibiotic at pharmacy for him.

## 2016-01-02 MED ORDER — LEVOFLOXACIN 500 MG PO TABS: 500.0000 mg | ORAL_TABLET | Freq: Every day | ORAL | 0 refills | Status: DC

## 2016-01-02 NOTE — Telephone Encounter (Signed)
Spoke with pt sister in reference to +ucx and pt needing abx. Sister stated that pt is currently in [place] with her and therefore abx will need to be sent to [facility] there. Medication was sent. Sister voiced understanding.

## 2016-01-11 ENCOUNTER — Ambulatory Visit: Payer: Medicare PPO | Admitting: Podiatry

## 2016-01-25 ENCOUNTER — Encounter: Payer: Self-pay | Admitting: Podiatry

## 2016-01-25 ENCOUNTER — Ambulatory Visit (INDEPENDENT_AMBULATORY_CARE_PROVIDER_SITE_OTHER): Payer: Medicare PPO | Admitting: Podiatry

## 2016-01-25 DIAGNOSIS — E0843 Diabetes mellitus due to underlying condition with diabetic autonomic (poly)neuropathy: Secondary | ICD-10-CM | POA: Diagnosis not present

## 2016-01-25 DIAGNOSIS — L84 Corns and callosities: Secondary | ICD-10-CM | POA: Diagnosis not present

## 2016-01-25 DIAGNOSIS — B351 Tinea unguium: Secondary | ICD-10-CM

## 2016-01-25 DIAGNOSIS — L608 Other nail disorders: Secondary | ICD-10-CM

## 2016-01-25 DIAGNOSIS — M79609 Pain in unspecified limb: Secondary | ICD-10-CM

## 2016-01-25 DIAGNOSIS — L603 Nail dystrophy: Secondary | ICD-10-CM

## 2016-01-25 DIAGNOSIS — M79676 Pain in unspecified toe(s): Secondary | ICD-10-CM

## 2016-02-02 NOTE — Progress Notes (Signed)
SUBJECTIVE Patient with a history of diabetes mellitus presents to office today complaining of elongated, thickened nails. Pain while ambulating in shoes. Patient is unable to trim their own nails.   Allergies  Allergen Reactions  . Ace Inhibitors Other (See Comments)    angioedema    OBJECTIVE General Patient is awake, alert, and oriented x 3 and in no acute distress. Derm Painful hyperkeratotic lesions noted to the great toe and second digit of the right foot. Skin is dry and supple bilateral. Negative open lesions or macerations. Remaining integument unremarkable. Nails are tender, long, thickened and dystrophic with subungual debris, consistent with onychomycosis, 1-5 bilateral. No signs of infection noted. Vasc  DP and PT pedal pulses palpable bilaterally. Temperature gradient within normal limits.  Neuro Epicritic and protective threshold sensation diminished bilaterally.  Musculoskeletal Exam No symptomatic pedal deformities noted bilateral. Muscular strength within normal limits.  ASSESSMENT 1. Diabetes Mellitus w/ peripheral neuropathy 2. Onychomycosis of nail due to dermatophyte bilateral 3. Painful callus lesions digits one-to right foot  4. Pain in foot bilateral  PLAN OF CARE 1. Patient evaluated today. 2. Instructed to maintain good pedal hygiene and foot care. Stressed importance of controlling blood sugar.  3. Mechanical debridement of nails 1-5 bilaterally performed using a nail nipper. Filed with dremel without incident.  4. Excisional debridement of the painful callus lesions was performed using a chisel blade. 5. Return to clinic in 3 mos.    Edrick Kins, DPM

## 2016-03-28 ENCOUNTER — Ambulatory Visit (INDEPENDENT_AMBULATORY_CARE_PROVIDER_SITE_OTHER): Payer: Medicare PPO | Admitting: Family Medicine

## 2016-03-28 ENCOUNTER — Encounter: Payer: Self-pay | Admitting: Family Medicine

## 2016-03-28 VITALS — BP 118/62 | HR 95 | Temp 98.5°F | Resp 14 | Wt 300.0 lb

## 2016-03-28 DIAGNOSIS — E1129 Type 2 diabetes mellitus with other diabetic kidney complication: Secondary | ICD-10-CM

## 2016-03-28 DIAGNOSIS — N521 Erectile dysfunction due to diseases classified elsewhere: Secondary | ICD-10-CM

## 2016-03-28 DIAGNOSIS — E1140 Type 2 diabetes mellitus with diabetic neuropathy, unspecified: Secondary | ICD-10-CM

## 2016-03-28 DIAGNOSIS — E782 Mixed hyperlipidemia: Secondary | ICD-10-CM | POA: Diagnosis not present

## 2016-03-28 DIAGNOSIS — I10 Essential (primary) hypertension: Secondary | ICD-10-CM | POA: Diagnosis not present

## 2016-03-28 DIAGNOSIS — E1165 Type 2 diabetes mellitus with hyperglycemia: Secondary | ICD-10-CM

## 2016-03-28 DIAGNOSIS — R809 Proteinuria, unspecified: Secondary | ICD-10-CM | POA: Diagnosis not present

## 2016-03-28 DIAGNOSIS — IMO0002 Reserved for concepts with insufficient information to code with codable children: Secondary | ICD-10-CM

## 2016-03-28 LAB — POCT GLYCOSYLATED HEMOGLOBIN (HGB A1C): HEMOGLOBIN A1C: 7.9

## 2016-03-28 MED ORDER — METFORMIN HCL ER 500 MG PO TB24: 2000.0000 mg | ORAL_TABLET | Freq: Every day | ORAL | 2 refills | Status: DC

## 2016-03-28 MED ORDER — NATEGLINIDE 60 MG PO TABS: 60.0000 mg | ORAL_TABLET | Freq: Three times a day (TID) | ORAL | 2 refills | Status: DC

## 2016-03-28 NOTE — Progress Notes (Signed)
Name: Jordan [name]   MRN: [id number]    DOB: [birth year]   Date:03/28/2016       Progress Note  Subjective  Chief Complaint  Chief Complaint  Patient presents with  . Follow-up    4 months   . Diabetes    Does not check his sugar  . Hypertension  . Hyperlipidemia  . Constipation    HPI  DM with microalbuminuria and  ED: he was on Glipizide but stopped by Dr. Vicente Masson in June, currently  on [place] however it is very expensive and he would like to try something cheaper. His hgbA1C is actually worse than it was in June.  HgbA1C is up at 7.9% today. He continues to drink sweet tea -and states he can't stop the one glass daily. He denies polyphagia, polydipsia. 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 Not on medication for ED, not on ARB/ACE because of angioedema  BPH: seen by Urologist and stable, not on medication for it. Testicular mass resolved with antibiotics.   HTN: well controlled with medication , no chest pain or palpitation. He denies orthostatic changes.   Hyperlipidemia: taking lipitor and denies side effects, no myalgia.   Obesity: his weight is very high, he has been walking 5 days a week for about 30-40 minutes, discussed portion control  Patient Active Problem List   Diagnosis Date Noted  . Bilateral hydrocele 11/27/2015  . Mass, scrotum 11/27/2015  . Ventral hernia without obstruction or gangrene 11/27/2015  . Controlled type 2 diabetes mellitus with microalbuminuria ([place]) 10/26/2015  . 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 Hyperlipidemia 11/07/2014  . Essential hypertension 11/07/2014  . Obesity, Class III, BMI 40-49.9 (morbid obesity) ([place]) 11/07/2014  . Gout 11/07/2014  . 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  . Epistaxis 09/21/2014  . 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 Heart murmur 12/02/2012  . Constipation 10/26/2012  . Incisional hernia, without obstruction or gangrene 10/26/2012  .  Left inguinal hernia     Past Surgical History:  Procedure Laterality Date  . cataract surgery    . COLONOSCOPY  2012  . EYE SURGERY  2011   cataract  . UMBILICAL HERNIA REPAIR  2011    Family History  Problem Relation Age of Onset  . Heart disease Father   . Hypertension Father   . Kidney disease Neg Hx   . Prostate cancer Neg Hx     Social History   Social History  . Marital status: Widowed    Spouse name: N/A  . Number of children: N/A  . Years of education: N/A   Occupational History  . Not on file.   Social History Main Topics  . Smoking status: Former Smoker    Packs/day: 1.00    Years: 10.00    Types: Cigarettes    Start date: 03/17/1965    Quit date: 11/27/1975  . Smokeless tobacco: Never Used     Comment: quit 40 years  . Alcohol use 0.0 oz/week     Comment: socially  . Drug use: No  . Sexual activity: Yes    Partners: Female   Other Topics Concern  . Not on file   Social History Narrative  . No narrative on file     Current Outpatient Prescriptions:  .  allopurinol (ZYLOPRIM) 300 MG tablet, Take 1 tablet (300 mg total) by mouth daily., Disp: 90 tablet, Rfl: 3 .  amLODipine (NORVASC) 2.5 MG tablet, Take 1 tablet (2.5  mg total) by mouth daily., Disp: 90 tablet, Rfl: 2 .  aspirin 81 MG tablet, Take 162 mg by mouth daily., Disp: , Rfl:  .  atorvastatin (LIPITOR) 40 MG tablet, Take 1 tablet (40 mg total) by mouth daily at 6 PM., Disp: 90 tablet, Rfl: 3 .  indapamide (LOZOL) 1.25 MG tablet, Take 1 tablet (1.25 mg total) by mouth every other day., Disp: 90 tablet, Rfl: 3 .  latanoprost (XALATAN) 0.005 % ophthalmic solution, , Disp: , Rfl:  .  niacin 100 MG tablet, Take 100 mg by mouth at bedtime., Disp: , Rfl:  .  Omega-3 Fatty Acids (FISH OIL PO), Take by mouth., Disp: , Rfl:  .  polyethylene glycol (MIRALAX / GLYCOLAX) packet, Take 17 g by mouth daily as needed., Disp: , Rfl:  .  metFORMIN (GLUCOPHAGE-XR) 500 MG 24 hr tablet, Take 4 tablets (2,000 mg  total) by mouth daily with breakfast., Disp: 120 tablet, Rfl: 2 .  nateglinide (STARLIX) 60 MG tablet, Take 1 tablet (60 mg total) by mouth 3 (three) times daily with meals., Disp: 90 tablet, Rfl: 2  Allergies  Allergen Reactions  . Ace Inhibitors Other (See Comments)    angioedema     ROS  Constitutional: Negative for fever or weight change.  Respiratory: Negative for cough and shortness of breath.   Cardiovascular: Negative for chest pain or palpitations.  Gastrointestinal: Negative for abdominal pain, no bowel changes.  Musculoskeletal: Negative for gait problem or joint swelling.  Skin: Negative for rash.  Neurological: Negative for dizziness or headache.  No other specific complaints in a complete review of systems (except as listed in HPI above).  Objective  Vitals:   03/28/16 1034  BP: 118/62  Pulse: 95  Resp: 14  Temp: 98.5 F (36.9 C)  SpO2: 96%  Weight: 300 lb (136.1 kg)    Body mass index is 44.3 kg/m.  Physical Exam  Constitutional: Patient appears well-developed and well-nourished. Obese  No distress.  HEENT: head atraumatic, normocephalic, pupils equal and reactive to light, neck supple, throat within normal limits Cardiovascular: Normal rate, regular rhythm and normal heart sounds.  No murmur heard. No BLE edema. Pulmonary/Chest: Effort normal and breath sounds normal. No respiratory distress. Abdominal: Soft.  There is no tenderness. Psychiatric: Patient has a normal mood and affect. behavior is normal. Judgment and thought content normal.   Recent Results (from the past 2160 hour(s))  POCT HgB A1C     Status: Abnormal   Collection Time: 03/28/16 10:53 AM  Result Value Ref Range   Hemoglobin A1C 7.9       PHQ2/9: Depression screen [facility] 2/9 03/28/2016 11/27/2015 10/26/2015 09/21/2014  Decreased Interest 0 0 1 0  Down, Depressed, Hopeless 0 0 0 0  PHQ - 2 Score 0 0 1 0    Fall Risk: Fall Risk  03/28/2016 11/27/2015 10/26/2015 11/07/2014 09/21/2014   Falls in the past year? No Yes Yes No No  Number falls in past yr: - 1 2 or more - -  Injury with Fall? - No No - -  Risk for fall due to : - - History of fall(s);Medication side effect - -  Follow up - - Falls evaluation completed - -      Functional Status Survey: Is the patient deaf or have difficulty hearing?: No Does the patient have difficulty seeing, even when wearing glasses/contacts?: Yes (reading glass) Does the patient have difficulty concentrating, remembering, or making decisions?: No Does the patient have difficulty walking  or climbing stairs?: No Does the patient have difficulty dressing or bathing?: No Does the patient have difficulty doing errands alone such as visiting a doctor's office or shopping?: No   Assessment & Plan  1. Uncontrolled type 2 diabetes mellitus with microalbuminuria, without long-term current use of insulin ([place])  He states that Janumet and Jardiance are too expensive, hgbA1C is not any better, we will change to Metformin and Starlix, discussed possible side effects, including hypoglycemia and importance to follow a diabetic diet - POCT HgB A1C - metFORMIN (GLUCOPHAGE-XR) 500 MG 24 hr tablet; Take 4 tablets (2,000 mg total) by mouth daily with breakfast.  Dispense: 120 tablet; Refill: 2 - nateglinide (STARLIX) 60 MG tablet; Take 1 tablet (60 mg total) by mouth 3 (three) times daily with meals.  Dispense: 90 tablet; Refill: 2  2. Obesity, Class III, BMI 40-49.9 (morbid obesity) ([place])  Discussed with the patient the risk posed by an increased BMI. Discussed importance of portion control, calorie counting and at least 150 minutes of physical activity weekly. Avoid sweet beverages and drink more water. Eat at least 6 servings of fruit and vegetables daily   3. Essential hypertension  Well controlled, continue medications, can't take ARB because of angioedema  4. Mixed hyperlipidemia  Continue statin therapy   5. 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  Dispense: 120 tablet; Refill: 2 - nateglinide (STARLIX) 60 MG tablet; Take 1 tablet (60 mg total) by mouth 3 (three) times daily with meals.  Dispense: 90 tablet; Refill: 2

## 2016-04-03 ENCOUNTER — Ambulatory Visit: Payer: Medicare PPO | Admitting: Podiatry

## 2016-04-07 ENCOUNTER — Encounter: Payer: Self-pay | Admitting: Podiatry

## 2016-04-07 ENCOUNTER — Ambulatory Visit (INDEPENDENT_AMBULATORY_CARE_PROVIDER_SITE_OTHER): Payer: Medicare PPO | Admitting: Podiatry

## 2016-04-07 DIAGNOSIS — E0843 Diabetes mellitus due to underlying condition with diabetic autonomic (poly)neuropathy: Secondary | ICD-10-CM

## 2016-04-07 DIAGNOSIS — M79676 Pain in unspecified toe(s): Secondary | ICD-10-CM

## 2016-04-07 DIAGNOSIS — B351 Tinea unguium: Secondary | ICD-10-CM

## 2016-04-07 DIAGNOSIS — M79609 Pain in unspecified limb: Secondary | ICD-10-CM

## 2016-04-07 DIAGNOSIS — L84 Corns and callosities: Secondary | ICD-10-CM | POA: Diagnosis not present

## 2016-04-07 NOTE — Progress Notes (Signed)
Complaint:  Visit Type: Patient returns to my office for continued preventative foot care services. Complaint: Patient states" my nails have grown long and thick and become painful to walk and wear shoes" Patient has been diagnosed with DM with no foot complications. The patient presents for preventative foot care services. No changes to ROS.  Painful callus due to deformed second toes both feet.  Podiatric Exam: Vascular: dorsalis pedis and posterior tibial pulses are palpable bilateral. Capillary return is immediate. Temperature gradient is WNL. Skin turgor WNL  Sensorium: Normal Semmes Weinstein monofilament test. Normal tactile sensation bilaterally. Nail Exam: Pt has thick disfigured discolored nails with subungual debris noted bilateral entire nail hallux through fifth toenails Ulcer Exam: There is no evidence of ulcer or pre-ulcerative changes or infection. Orthopedic Exam: Muscle tone and strength are WNL. No limitations in general ROM. No crepitus or effusions noted. Foot type and digits show no abnormalities. Bony prominences are unremarkable. Hammer toes second  B/L Skin: No Porokeratosis. No infection or ulcers.  Corn second toe right foot  Diagnosis:  Onychomycosis, , Pain in right toe, pain in left toes, Corn/callus  Treatment & Plan Procedures and Treatment: Consent by patient was obtained for treatment procedures. The patient understood the discussion of treatment and procedures well. All questions were answered thoroughly reviewed. Debridement of mycotic and hypertrophic toenails, 1 through 5 bilateral and clearing of subungual debris. No ulceration, no infection noted. Debride corn second toe right Return Visit-Office Procedure: Patient instructed to return to the office for a follow up visit 3 months for continued evaluation and treatment.    Gardiner Barefoot DPM

## 2016-07-17 ENCOUNTER — Other Ambulatory Visit: Payer: Self-pay | Admitting: Family Medicine

## 2016-07-17 DIAGNOSIS — E1129 Type 2 diabetes mellitus with other diabetic kidney complication: Secondary | ICD-10-CM

## 2016-07-17 DIAGNOSIS — R809 Proteinuria, unspecified: Principal | ICD-10-CM

## 2016-07-17 DIAGNOSIS — N521 Erectile dysfunction due to diseases classified elsewhere: Secondary | ICD-10-CM

## 2016-07-17 DIAGNOSIS — E1140 Type 2 diabetes mellitus with diabetic neuropathy, unspecified: Secondary | ICD-10-CM

## 2016-07-17 DIAGNOSIS — E1165 Type 2 diabetes mellitus with hyperglycemia: Principal | ICD-10-CM

## 2016-07-17 DIAGNOSIS — IMO0002 Reserved for concepts with insufficient information to code with codable children: Secondary | ICD-10-CM

## 2016-07-28 ENCOUNTER — Ambulatory Visit (INDEPENDENT_AMBULATORY_CARE_PROVIDER_SITE_OTHER): Payer: Medicare PPO | Admitting: Family Medicine

## 2016-07-28 ENCOUNTER — Encounter: Payer: Self-pay | Admitting: Podiatry

## 2016-07-28 ENCOUNTER — Encounter: Payer: Self-pay | Admitting: Family Medicine

## 2016-07-28 ENCOUNTER — Ambulatory Visit (INDEPENDENT_AMBULATORY_CARE_PROVIDER_SITE_OTHER): Payer: Medicare PPO | Admitting: Podiatry

## 2016-07-28 VITALS — BP 134/78 | HR 106 | Temp 97.5°F | Resp 16 | Ht 69.0 in | Wt 293.7 lb

## 2016-07-28 DIAGNOSIS — R809 Proteinuria, unspecified: Secondary | ICD-10-CM | POA: Diagnosis not present

## 2016-07-28 DIAGNOSIS — M109 Gout, unspecified: Secondary | ICD-10-CM

## 2016-07-28 DIAGNOSIS — N521 Erectile dysfunction due to diseases classified elsewhere: Secondary | ICD-10-CM | POA: Diagnosis not present

## 2016-07-28 DIAGNOSIS — L84 Corns and callosities: Secondary | ICD-10-CM

## 2016-07-28 DIAGNOSIS — I10 Essential (primary) hypertension: Secondary | ICD-10-CM | POA: Diagnosis not present

## 2016-07-28 DIAGNOSIS — E782 Mixed hyperlipidemia: Secondary | ICD-10-CM

## 2016-07-28 DIAGNOSIS — B351 Tinea unguium: Secondary | ICD-10-CM

## 2016-07-28 DIAGNOSIS — E1165 Type 2 diabetes mellitus with hyperglycemia: Secondary | ICD-10-CM | POA: Diagnosis not present

## 2016-07-28 DIAGNOSIS — E1129 Type 2 diabetes mellitus with other diabetic kidney complication: Secondary | ICD-10-CM

## 2016-07-28 DIAGNOSIS — M79676 Pain in unspecified toe(s): Secondary | ICD-10-CM

## 2016-07-28 DIAGNOSIS — E1140 Type 2 diabetes mellitus with diabetic neuropathy, unspecified: Secondary | ICD-10-CM | POA: Diagnosis not present

## 2016-07-28 DIAGNOSIS — E1142 Type 2 diabetes mellitus with diabetic polyneuropathy: Secondary | ICD-10-CM

## 2016-07-28 DIAGNOSIS — IMO0002 Reserved for concepts with insufficient information to code with codable children: Secondary | ICD-10-CM

## 2016-07-28 LAB — CBC WITH DIFFERENTIAL/PLATELET
BASOS ABS: 0 {cells}/uL (ref 0–200)
BASOS PCT: 0 %
Eosinophils Absolute: 49 cells/uL (ref 15–500)
Eosinophils Relative: 1 %
HCT: 44.7 % (ref 38.5–50.0)
Hemoglobin: 14.4 g/dL (ref 13.2–17.1)
LYMPHS ABS: 1323 {cells}/uL (ref 850–3900)
Lymphocytes Relative: 27 %
MCH: 28.6 pg (ref 27.0–33.0)
MCHC: 32.2 g/dL (ref 32.0–36.0)
MCV: 88.7 fL (ref 80.0–100.0)
MONOS PCT: 9 %
MPV: 11 fL (ref 7.5–12.5)
Monocytes Absolute: 441 cells/uL (ref 200–950)
NEUTROS ABS: 3087 {cells}/uL (ref 1500–7800)
Neutrophils Relative %: 63 %
PLATELETS: 198 10*3/uL (ref 140–400)
RBC: 5.04 MIL/uL (ref 4.20–5.80)
RDW: 18.3 % — ABNORMAL HIGH (ref 11.0–15.0)
WBC: 4.9 10*3/uL (ref 3.8–10.8)

## 2016-07-28 LAB — POCT GLYCOSYLATED HEMOGLOBIN (HGB A1C): HEMOGLOBIN A1C: 9.2

## 2016-07-28 MED ORDER — METFORMIN HCL ER 500 MG PO TB24: 2000.0000 mg | ORAL_TABLET | Freq: Every day | ORAL | 2 refills | Status: DC

## 2016-07-28 MED ORDER — AMLODIPINE BESYLATE 2.5 MG PO TABS: 2.5000 mg | ORAL_TABLET | Freq: Every day | ORAL | 2 refills | Status: DC

## 2016-07-28 MED ORDER — JARDIANCE 25 MG PO TABS: 25.0000 mg | ORAL_TABLET | Freq: Every day | ORAL | 1 refills | Status: DC

## 2016-07-28 MED ORDER — ATORVASTATIN CALCIUM 40 MG PO TABS: 40.0000 mg | ORAL_TABLET | Freq: Every day | ORAL | 3 refills | Status: DC

## 2016-07-28 MED ORDER — ALLOPURINOL 300 MG PO TABS: 300.0000 mg | ORAL_TABLET | Freq: Every day | ORAL | 3 refills | Status: DC

## 2016-07-28 MED ORDER — NATEGLINIDE 60 MG PO TABS: 60.0000 mg | ORAL_TABLET | Freq: Three times a day (TID) | ORAL | 1 refills | Status: DC

## 2016-07-28 MED ORDER — INDAPAMIDE 1.25 MG PO TABS: 1.2500 mg | ORAL_TABLET | ORAL | 3 refills | Status: DC

## 2016-07-28 NOTE — Progress Notes (Signed)
Name: Jordan [name]   MRN: [id number]    DOB: 10-18-1940   Date:07/28/2016       Progress Note  Subjective  Chief Complaint  Chief Complaint  Patient presents with  . Medication Refill    4 month F/U  . Diabetes    Patient does not check sugar at home, never started Metformin from last visit  . Hyperlipidemia    Denies any symptoms  . Hypertension  . Obesity    Patient has been walking 5x weekly and has lost 7 pounds since last visit.    HPI   DM with microalbuminuria and ED: he was on Glipizide but stopped by Dr. Vicente Masson in June 2017, last visit he was  on [place]  and Jardiance however he asked to change because of cost. We sent rx to [facility] for Meformin 200 mg total daily and Starlix, however he was dispensed Starlix and Jardiance . His hgbA1C is worse.  HgbA1C wast 7.9% and is up to 9.2%  today. He continues to drink sweet tea -and states he can't stop the one glass daily. He denies polyphagia, but has polydipsia and polyuria. 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 He states he had eye exam done recently and we will order results.   BPH: seen by [place] last year because of testicular mass, diagnosed with hydrocele and small fat containing left inguinal hernia.  HTN: well controlled with medication , no chest pain, SOB or palpitation. He denies orthostatic changes.   Hyperlipidemia: taking lipitor and denies side effects, no myalgia.   Obesity: he lost a little weight since last visit,  he has been walking 5 days a week for about 30-35 minutes, he is trying to eat smaller portion.   Patient Active Problem List   Diagnosis Date Noted  . Bilateral hydrocele 11/27/2015  . Mass, scrotum 11/27/2015  . Ventral hernia without obstruction or gangrene 11/27/2015  . Controlled type 2 diabetes mellitus with microalbuminuria ([place]) 10/26/2015  . 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 Hyperlipidemia  11/07/2014  . Essential hypertension 11/07/2014  . Obesity, Class III, BMI 40-49.9 (morbid obesity) ([place]) 11/07/2014  . Gout 11/07/2014  . 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  . Epistaxis 09/21/2014  . 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 Heart murmur 12/02/2012  . Constipation 10/26/2012  . Incisional hernia, without obstruction or gangrene 10/26/2012  . Left inguinal hernia     Past Surgical History:  Procedure Laterality Date  . cataract surgery    . COLONOSCOPY  2012  . EYE SURGERY  2011   cataract  . UMBILICAL HERNIA REPAIR  2011    Family History  Problem Relation Age of Onset  . Heart disease Father   . Hypertension Father   . Kidney disease Neg Hx   . Prostate cancer Neg Hx     Social History   Social History  . Marital status: Widowed    Spouse name: N/A  . Number of children: N/A  . Years of education: N/A   Occupational History  . Not on file.   Social History Main Topics  . Smoking status: Former Smoker    Packs/day: 1.00    Years: 10.00    Types: Cigarettes    Start date: 03/17/1965    Quit date: 11/27/1975  . Smokeless tobacco: Never Used     Comment: quit 40 years  . Alcohol use 0.0 oz/week     Comment: socially  .  Drug use: No  . Sexual activity: Yes    Partners: Female   Other Topics Concern  . Not on file   Social History Narrative  . No narrative on file     Current Outpatient Prescriptions:  .  allopurinol (ZYLOPRIM) 300 MG tablet, Take 1 tablet (300 mg total) by mouth daily., Disp: 90 tablet, Rfl: 3 .  amLODipine (NORVASC) 2.5 MG tablet, Take 1 tablet (2.5 mg total) by mouth daily., Disp: 90 tablet, Rfl: 2 .  aspirin 81 MG tablet, Take 162 mg by mouth daily., Disp: , Rfl:  .  atorvastatin (LIPITOR) 40 MG tablet, Take 1 tablet (40 mg total) by mouth daily at 6 PM., Disp: 90 tablet, Rfl: 3 .  indapamide (LOZOL) 1.25 MG tablet, Take 1 tablet (1.25 mg total) by mouth every other day., Disp: 90 tablet, Rfl: 3 .  JARDIANCE  25 MG TABS tablet, Take 25 mg by mouth daily., Disp: 90 tablet, Rfl: 1 .  latanoprost (XALATAN) 0.005 % ophthalmic solution, , Disp: , Rfl:  .  metFORMIN (GLUCOPHAGE-XR) 500 MG 24 hr tablet, Take 4 tablets (2,000 mg total) by mouth daily with breakfast., Disp: 120 tablet, Rfl: 2 .  nateglinide (STARLIX) 60 MG tablet, Take 1 tablet (60 mg total) by mouth 3 (three) times daily with meals., Disp: 270 tablet, Rfl: 1 .  niacin 100 MG tablet, Take 100 mg by mouth at bedtime., Disp: , Rfl:  .  Omega-3 Fatty Acids (FISH OIL PO), Take by mouth., Disp: , Rfl:  .  polyethylene glycol (MIRALAX / GLYCOLAX) packet, Take 17 g by mouth daily as needed., Disp: , Rfl:   Allergies  Allergen Reactions  . Ace Inhibitors Other (See Comments)    angioedema     ROS  Constitutional: Negative for fever, positive for  weight change.  Respiratory: Negative for cough and shortness of breath.   Cardiovascular: Negative for chest pain or palpitations.  Gastrointestinal: Negative for abdominal pain, no bowel changes.  Musculoskeletal: Negative for gait problem or joint swelling.  Skin: Negative for rash.  Neurological: Negative for dizziness or headache.  No other specific complaints in a complete review of systems (except as listed in HPI above).  Objective  Vitals:   07/28/16 1220  BP: 134/78  Pulse: (!) 106  Resp: 16  Temp: 97.5 F (36.4 C)  TempSrc: Oral  SpO2: 97%  Weight: 293 lb 11.2 oz (133.2 kg)  Height: 5\' 9"  (1.753 m)    Body mass index is 43.37 kg/m.  Physical Exam  Constitutional: Patient appears well-developed and well-nourished. Obese  No distress.  HEENT: head atraumatic, normocephalic, pupils equal and reactive to light, neck supple, throat within normal limits Cardiovascular: Normal rate, regular rhythm and normal heart sounds.  No murmur heard. Trace  BLE edema. Pulmonary/Chest: Effort normal and breath sounds normal. No respiratory distress. Abdominal: Soft.  There is no  tenderness. Psychiatric: Patient has a normal mood and affect. behavior is normal. Judgment and thought content normal.  Recent Results (from the past 2160 hour(s))  POCT HgB A1C     Status: Abnormal   Collection Time: 07/28/16 12:29 PM  Result Value Ref Range   Hemoglobin A1C 9.2      PHQ2/9: Depression screen [facility] 2/9 07/28/2016 03/28/2016 11/27/2015 10/26/2015 09/21/2014  Decreased Interest 0 0 0 1 0  Down, Depressed, Hopeless 0 0 0 0 0  PHQ - 2 Score 0 0 0 1 0     Fall Risk: Fall Risk  07/28/2016  03/28/2016 11/27/2015 10/26/2015 11/07/2014  Falls in the past year? No No Yes Yes No  Number falls in past yr: - - 1 2 or more -  Injury with Fall? - - No No -  Risk for fall due to : - - - History of fall(s);Medication side effect -  Follow up - - - Falls evaluation completed -     Functional Status Survey: Is the patient deaf or have difficulty hearing?: No Does the patient have difficulty seeing, even when wearing glasses/contacts?: No Does the patient have difficulty concentrating, remembering, or making decisions?: No Does the patient have difficulty walking or climbing stairs?: No Does the patient have difficulty dressing or bathing?: No Does the patient have difficulty doing errands alone such as visiting a doctor's office or shopping?: No    Assessment & Plan  1. 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  Dispense: 120 tablet; Refill: 2 - nateglinide (STARLIX) 60 MG tablet; Take 1 tablet (60 mg total) by mouth 3 (three) times daily with meals.  Dispense: 270 tablet; Refill: 1 - JARDIANCE 25 MG TABS tablet; Take 25 mg by mouth daily.  Dispense: 90 tablet; Refill: 1  2.  Uncontrolled type 2 diabetes mellitus with microalbuminuria, without long-term current use of insulin (HCC)  - metFORMIN (GLUCOPHAGE-XR) 500 MG 24 hr tablet; Take 4 tablets (2,000 mg total) by mouth daily with breakfast.  Dispense: 120 tablet; Refill: 2 - nateglinide (STARLIX) 60 MG tablet; Take 1 tablet (60 mg total) by mouth 3 (three) times daily with meals.  Dispense: 270 tablet; Refill: 1 - JARDIANCE 25 MG TABS tablet; Take 25 mg by mouth daily.  Dispense: 90 tablet; Refill: 1  3. Obesity, Class III, BMI 40-49.9 (morbid obesity) (HCC)  Discussed GLP-1 agonist, but he refuses it at this time  4. Essential hypertension  - COMPLETE METABOLIC PANEL WITH GFR - CBC with Differential/Platelet - indapamide (LOZOL) 1.25 MG tablet; Take 1 tablet (1.25 mg total) by mouth every other day.  Dispense: 90 tablet; Refill: 3 - amLODipine (NORVASC) 2.5 MG tablet; Take 1 tablet (2.5 mg total) by mouth daily.  Dispense: 90 tablet; Refill: 2  5. Mixed hyperlipidemia  - Lipid panel - atorvastatin (LIPITOR) 40 MG tablet; Take 1 tablet (40 mg total) by mouth daily at 6 PM.  Dispense: 90 tablet; Refill: 3  6. 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 Controlled gout  - Uric acid - allopurinol (ZYLOPRIM) 300 MG tablet; Take 1 tablet (300 mg total) by mouth daily.  Dispense: 90 tablet; Refill: 3

## 2016-07-28 NOTE — Patient Instructions (Signed)
Avoid: rice, potatoes, pasta, bread or cracker.  Never eat : sweets or sweet beverage until sugar is better control

## 2016-07-28 NOTE — Progress Notes (Signed)
Complaint:  Visit Type: Patient returns to my office for continued preventative foot care services. Complaint: Patient states" my nails have grown long and thick and become painful to walk and wear shoes" Patient has been diagnosed with DM with no foot complications. The patient presents for preventative foot care services. No changes to ROS.  Painful corn second toe right foot.  Podiatric Exam: Vascular: dorsalis pedis and posterior tibial pulses are palpable bilateral. Capillary return is immediate. Temperature gradient is WNL. Skin turgor WNL  Sensorium: Normal Semmes Weinstein monofilament test. Normal tactile sensation bilaterally. Nail Exam: Pt has thick disfigured discolored nails with subungual debris noted bilateral entire nail hallux through fifth toenails Ulcer Exam: There is no evidence of ulcer or pre-ulcerative changes or infection. Orthopedic Exam: Muscle tone and strength are WNL. No limitations in general ROM. No crepitus or effusions noted. HAV  B/L. Hammer toes second  B/L Skin: No Porokeratosis. No infection or ulcers.  Corn second toe right foot.    Diagnosis:  Onychomycosis, , Pain in right toe, pain in left toes, Corn/callus  Treatment & Plan Procedures and Treatment: Consent by patient was obtained for treatment procedures. The patient understood the discussion of treatment and procedures well. All questions were answered thoroughly reviewed. Debridement of mycotic and hypertrophic toenails, 1 through 5 bilateral and clearing of subungual debris. No ulceration, no infection noted. Debride corn second toe right.  Padding dispensed. Return Visit-Office Procedure: Patient instructed to return to the office for a follow up visit 3 months for continued evaluation and treatment.    Keviana Guida DPM 

## 2016-07-29 LAB — COMPLETE METABOLIC PANEL WITH GFR
ALBUMIN: 4.1 g/dL (ref 3.6–5.1)
ALK PHOS: 63 U/L (ref 40–115)
ALT: 16 U/L (ref 9–46)
AST: 25 U/L (ref 10–35)
BILIRUBIN TOTAL: 0.7 mg/dL (ref 0.2–1.2)
BUN: 22 mg/dL (ref 7–25)
CALCIUM: 9.9 mg/dL (ref 8.6–10.3)
CHLORIDE: 100 mmol/L (ref 98–110)
CO2: 25 mmol/L (ref 20–31)
CREATININE: 1.22 mg/dL — AB (ref 0.70–1.18)
GFR, EST AFRICAN AMERICAN: 67 mL/min (ref >=60)
GFR, Est Non African American: 58 mL/min — ABNORMAL LOW (ref >=60)
Glucose, Bld: 236 mg/dL — ABNORMAL HIGH (ref 65–99)
Potassium: 5.2 mmol/L (ref 3.5–5.3)
Sodium: 138 mmol/L (ref 135–146)
Total Protein: 8.3 g/dL — ABNORMAL HIGH (ref 6.1–8.1)

## 2016-07-29 LAB — LIPID PANEL
Cholesterol: 158 mg/dL (ref <200)
HDL: 52 mg/dL (ref >40)
LDL Cholesterol: 86 mg/dL (ref <100)
Total CHOL/HDL Ratio: 3 Ratio (ref <5.0)
Triglycerides: 101 mg/dL (ref <150)
VLDL: 20 mg/dL (ref <30)

## 2016-07-29 LAB — URIC ACID: Uric Acid, Serum: 4.4 mg/dL (ref 4.0–8.0)

## 2016-08-15 ENCOUNTER — Other Ambulatory Visit: Payer: Self-pay

## 2016-08-15 DIAGNOSIS — E1140 Type 2 diabetes mellitus with diabetic neuropathy, unspecified: Secondary | ICD-10-CM

## 2016-08-15 DIAGNOSIS — E1165 Type 2 diabetes mellitus with hyperglycemia: Principal | ICD-10-CM

## 2016-08-15 DIAGNOSIS — IMO0002 Reserved for concepts with insufficient information to code with codable children: Secondary | ICD-10-CM

## 2016-08-15 DIAGNOSIS — E1129 Type 2 diabetes mellitus with other diabetic kidney complication: Secondary | ICD-10-CM

## 2016-08-15 DIAGNOSIS — R809 Proteinuria, unspecified: Principal | ICD-10-CM

## 2016-08-15 DIAGNOSIS — N521 Erectile dysfunction due to diseases classified elsewhere: Secondary | ICD-10-CM

## 2016-08-15 MED ORDER — NATEGLINIDE 60 MG PO TABS: 60.0000 mg | ORAL_TABLET | Freq: Three times a day (TID) | ORAL | 5 refills | Status: DC

## 2016-08-15 MED ORDER — JARDIANCE 25 MG PO TABS: 25.0000 mg | ORAL_TABLET | Freq: Every day | ORAL | 5 refills | Status: DC

## 2016-08-15 NOTE — Telephone Encounter (Signed)
Patient requesting refill of Jardiance and Starlix at [place]. Due to cost was unable to get these thru [facility] last month. Is requesting just 30 days of medication at a time at [facility] so it will be affordable. Thanks

## 2016-08-19 ENCOUNTER — Encounter: Payer: Medicare PPO | Attending: Family Medicine | Admitting: *Deleted

## 2016-08-19 ENCOUNTER — Encounter: Payer: Self-pay | Admitting: *Deleted

## 2016-08-19 VITALS — BP 132/70 | Ht 69.0 in | Wt 291.4 lb

## 2016-08-19 DIAGNOSIS — E1165 Type 2 diabetes mellitus with hyperglycemia: Secondary | ICD-10-CM

## 2016-08-19 DIAGNOSIS — E119 Type 2 diabetes mellitus without complications: Secondary | ICD-10-CM | POA: Insufficient documentation

## 2016-08-19 DIAGNOSIS — Z713 Dietary counseling and surveillance: Secondary | ICD-10-CM | POA: Insufficient documentation

## 2016-08-19 NOTE — Progress Notes (Signed)
Diabetes Self-Management Education  Visit Type: First/Initial  Appt. Start Time: 1330 Appt. End Time: 1440  08/19/2016  [name], identified by name and date of birth, is a 76 y.o. male with a diagnosis of Diabetes: Type 2.   ASSESSMENT  Blood pressure 132/70, height 5\' 9"  (1.753 m), weight 291 lb 6.4 oz (132.2 kg). Body mass index is 43.03 kg/m.      Diabetes Self-Management Education - 08/19/16 1634      Visit Information   Visit Type First/Initial     Initial Visit   Diabetes Type Type 2   Are you currently following a meal plan? Yes   What type of meal plan do you follow? "stopped drinking sugar sweetened tea and eating whole wheat bread"   Are you taking your medications as prescribed? Yes  Pt reports he wasn't taking Metformin due to his pharmacy from Jan until a few weeks ago. He reports that is why his A1C was now high.    Date Diagnosed 1990's     Health Coping   How would you rate your overall health? Good     Psychosocial Assessment   Patient Belief/Attitude about Diabetes Motivated to manage diabetes   Self-care barriers None   Self-management support Doctor's office;Family   Patient Concerns Nutrition/Meal planning;Medication;Monitoring;Weight Control;Healthy Lifestyle;Glycemic Control   Special Needs None   Preferred Learning Style Auditory;Visual;Hands on   [place] in progress   How often do you need to have someone help you when you read instructions, pamphlets, or other written materials from your doctor or pharmacy? 1 - Never   What is the last grade level you completed in school? 3+ years college     Pre-Education Assessment   Patient understands the diabetes disease and treatment process. Needs Instruction   Patient understands incorporating nutritional management into lifestyle. Needs Instruction   Patient undertands incorporating physical activity into lifestyle. Needs Instruction   Patient understands using medications  safely. Needs Instruction   Patient understands monitoring blood glucose, interpreting and using results Needs Instruction   Patient understands prevention, detection, and treatment of acute complications. Needs Instruction   Patient understands prevention, detection, and treatment of chronic complications. Needs Instruction   Patient understands how to develop strategies to address psychosocial issues. Needs Instruction   Patient understands how to develop strategies to promote health/change behavior. Needs Instruction     Complications   Last HgB A1C per patient/outside source 9.2 %  07/28/16   How often do you check your blood sugar? 0 times/day (not testing)  He is waiting on mail order pharmacy to send his glucometer.    Have you had a dilated eye exam in the past 12 months? Yes   Have you had a dental exam in the past 12 months? No   Are you checking your feet? No  goes to podiatrist every 3 months     Dietary Intake   Breakfast sandwich with bologna, cheese or peanut butter   Lunch bolied egg   Dinner meat - pork chops, chicken, fish - baked or fried; bread, potatoes, peas, corn, beans, succatosh   Snack (evening) ice cream sandwich or popcicle   Beverage(s) water, Propel     Exercise   Exercise Type Light (walking / raking leaves)   How many days per week to you exercise? 5   How many minutes per day do you exercise? 35   Total minutes per week of exercise 175     Patient Education  Previous Diabetes Education No   Disease state  Definition of diabetes, type 1 and 2, and the diagnosis of diabetes   Nutrition management  Role of diet in the treatment of diabetes and the relationship between the three main macronutrients and blood glucose level;Food label reading, portion sizes and measuring food.;Reviewed blood glucose goals for pre and post meals and how to evaluate the patients' food intake on their blood glucose level.   Physical activity and exercise  Role of exercise on  diabetes management, blood pressure control and cardiac health.   Medications Reviewed patients medication for diabetes, action, purpose, timing of dose and side effects.   Monitoring Purpose and frequency of SMBG.;Taught/discussed recording of test results and interpretation of SMBG.;Identified appropriate SMBG and/or A1C goals.   Chronic complications Relationship between chronic complications and blood glucose control   Psychosocial adjustment Identified and addressed patients feelings and concerns about diabetes     Individualized Goals (developed by patient)   Reducing Risk Improve blood sugars Decrease medications Prevent diabetes complications Lose weight Lead a healthier lifestyle     Outcomes   Expected Outcomes Demonstrated interest in learning. Expect positive outcomes   Future DMSE 2 wks      Individualized Plan for Diabetes Self-Management Training:   Learning Objective:  Patient will have a greater understanding of diabetes self-management. Patient education plan is to attend individual and/or group sessions per assessed needs and concerns.   Plan:   Patient Instructions  Check blood sugars 2 x day before breakfast and 2 hrs after supper every day Bring blood sugar records to the next class Exercise: Continue walking  for    35  minutes   5  days a week Eat 3 meals day,  1-2  snacks a day Space meals 4-6 hours apart Continue avoiding sugar sweetened drinks (tea)  Expected Outcomes:  Demonstrated interest in learning. Expect positive outcomes  Education material provided:  General Meal Planning Guidelines Simple Meal Plan  If problems or questions, patient to contact team via:  Jordan [name], [place], [place], CDE [phone]  Future DSME appointment: 2 wks  September 08, 2016 for Diabetes Class 1

## 2016-08-19 NOTE — Patient Instructions (Signed)
Check blood sugars 2 x day before breakfast and 2 hrs after supper every day Bring blood sugar records to the next class  Exercise: Continue walking  for    35  minutes   5  days a week  Eat 3 meals day,  1-2  snacks a day Space meals 4-6 hours apart Continue avoiding sugar sweetened drinks (tea)  Return for classes on:

## 2016-09-02 NOTE — Progress Notes (Signed)
09/03/2016 10:52 AM   Jordan [name] 27-Sep-1940 [id number]  Referring provider: Steele Sizer, MD [address] [place], [place] [address]  Chief Complaint  Patient presents with  . Follow-up    patient having trouble retracting foreskin    HPI: Patient is a 76 year old African American male who presents today for with the complaint of difficulty retracting his foreskin and BPH with LUTS.     Difficulty retracting foreskin He states that he has been having difficulty retracting his foreskin for one month.  He denies any infections, but he feels it is difficult to stay clean.    BPH WITH LUTS His IPSS score today is 18, which is moderate lower urinary tract symptomatology. He is mixed with his quality life due to his urinary symptoms.  His PVR is 29 mL.  His previous IPSS score was 17/3.      His major complaint today urgency and nocturia.  He has had these symptoms for one to two years.  He denies any dysuria, hematuria or suprapubic pain.      He also denies any recent fevers, chills, nausea or vomiting.        IPSS    Row Name 09/03/16 1000         International Prostate Symptom Score   How often have you had the sensation of not emptying your bladder? Less than half the time     How often have you had to urinate less than every two hours? About half the time     How often have you found you stopped and started again several times when you urinated? Less than 1 in 5 times     How often have you found it difficult to postpone urination? Almost always     How often have you had a weak urinary stream? About half the time     How often have you had to strain to start urination? Less than 1 in 5 times     How many times did you typically get up at night to urinate? 3 Times     Total IPSS Score 18       Quality of Life due to urinary symptoms   If you were to spend the rest of your life with your urinary condition just the way it is now how would you  feel about that? Mixed        Score:  1-7 Mild 8-19 Moderate 20-35 Severe   PMH: Past Medical History:  Diagnosis Date  . Chronic kidney disease, stage II (mild)   . Decreased libido   . Diabetes mellitus without complication ([place])   . Glaucoma   . Gout 2009  . Heart murmur   . Hernia [id number]  . Hyperlipidemia   . Hypertension   . Inguinal hernia without mention of obstruction or gangrene, unilateral or unspecified, (not specified as recurrent)   . Lumbago   . Obesity   . Unspecified constipation     Surgical History: Past Surgical History:  Procedure Laterality Date  . cataract surgery    . COLONOSCOPY  2012  . EYE SURGERY  2011   cataract  . UMBILICAL HERNIA REPAIR  2011    Home Medications:  Allergies as of 09/03/2016      Reactions   Ace Inhibitors Other (See Comments)   angioedema      Medication List       Accurate as of 09/03/16 10:52 AM. Always use  your most recent med list.          allopurinol 300 MG tablet Commonly known as:  ZYLOPRIM Take 1 tablet (300 mg total) by mouth daily.   amLODipine 2.5 MG tablet Commonly known as:  NORVASC Take 1 tablet (2.5 mg total) by mouth daily.   aspirin 81 MG tablet Take 162 mg by mouth daily.   atorvastatin 40 MG tablet Commonly known as:  LIPITOR Take 1 tablet (40 mg total) by mouth daily at 6 PM.   clotrimazole-betamethasone cream Commonly known as:  LOTRISONE Apply 1 application topically 2 (two) times daily.   FISH OIL PO Take 1 capsule by mouth 2 (two) times daily.   indapamide 1.25 MG tablet Commonly known as:  LOZOL Take 1 tablet (1.25 mg total) by mouth every other day.   JARDIANCE 25 MG Tabs tablet Generic drug:  empagliflozin Take 25 mg by mouth daily.   latanoprost 0.005 % ophthalmic solution Commonly known as:  XALATAN Place 1 drop into the left eye at bedtime.   metFORMIN 500 MG 24 hr tablet Commonly known as:  GLUCOPHAGE-XR Take 4 tablets (2,000 mg total) by mouth  daily with breakfast.   nateglinide 60 MG tablet Commonly known as:  STARLIX Take 1 tablet (60 mg total) by mouth 3 (three) times daily with meals.   niacin 100 MG tablet Take 100 mg by mouth at bedtime.   polyethylene glycol packet Commonly known as:  MIRALAX / GLYCOLAX Take 17 g by mouth daily as needed.       Allergies:  Allergies  Allergen Reactions  . Ace Inhibitors Other (See Comments)    angioedema    Family History: Family History  Problem Relation Age of Onset  . Heart disease Father   . Hypertension Father   . Diabetes Sister   . Diabetes Brother   . Prostate cancer Brother   . Diabetes Sister   . Kidney disease Neg Hx   . Kidney cancer Neg Hx   . Bladder Cancer Neg Hx     Social History:  reports that he quit smoking about 40 years ago. His smoking use included Cigarettes. He started smoking about 51 years ago. He has a 10.00 pack-year smoking history. He has never used smokeless tobacco. He reports that he drinks alcohol. He reports that he does not use drugs.  ROS: UROLOGY Frequent Urination?: No Hard to postpone urination?: Yes Burning/pain with urination?: No Get up at night to urinate?: Yes Leakage of urine?: No Urine stream starts and stops?: No Trouble starting stream?: No Do you have to strain to urinate?: No Blood in urine?: No Urinary tract infection?: No Sexually transmitted disease?: No Injury to kidneys or bladder?: No Painful intercourse?: No Weak stream?: No Erection problems?: Yes Penile pain?: No  Gastrointestinal Nausea?: No Vomiting?: No Indigestion/heartburn?: No Diarrhea?: No Constipation?: No  Constitutional Fever: No Night sweats?: Yes Weight loss?: No Fatigue?: No  Skin Skin rash/lesions?: No Itching?: No  Eyes Blurred vision?: No Double vision?: No  Ears/Nose/Throat Sore throat?: No Sinus problems?: No  Hematologic/Lymphatic Swollen glands?: No Easy bruising?: No  Cardiovascular Leg swelling?:  No Chest pain?: No  Respiratory Cough?: Yes Shortness of breath?: No  Endocrine Excessive thirst?: No  Musculoskeletal Back pain?: Yes Joint pain?: No  Neurological Headaches?: No Dizziness?: No  Psychologic Depression?: No Anxiety?: No  Physical Exam: BP 138/85   Pulse 96   Ht 5\' 9"  (1.753 m)   Wt 287 lb 9.6 oz (130.5 kg)  BMI 42.47 kg/m   Constitutional: Well nourished. Alert and oriented, No acute distress. HEENT: [place] AT, moist mucus membranes. Trachea midline, no masses. Cardiovascular: No clubbing, cyanosis, or edema. Respiratory: Normal respiratory effort, no increased work of breathing. GI: Abdomen is soft, non tender, non distended, no abdominal masses. Liver and spleen not palpable.  No hernias appreciated.  Stool sample for occult testing is not indicated.   GU: No CVA tenderness.  No bladder fullness or masses.  Patient with uncircumcised phallus. Foreskin not able to be retracted Urethral meatus is patent.  No penile discharge. No penile lesions or rashes. Scrotum without lesions, cysts, rashes and/or edema.  Testicles are located scrotally bilaterally. Right testicle is no longer enlarged and firm.  Non tender.  No erythema.  No masses are appreciated in the testicles. Left and right epididymis are normal. Rectal: Not performed today. Skin: No rashes, bruises or suspicious lesions. Lymph: No cervical or inguinal adenopathy. Neurologic: Grossly intact, no focal deficits, moving all 4 extremities. Psychiatric: Normal mood and affect.  Laboratory Data: Lab Results  Component Value Date   WBC 4.9 07/28/2016   HGB 14.4 07/28/2016   HCT 44.7 07/28/2016   MCV 88.7 07/28/2016   PLT 198 07/28/2016    Lab Results  Component Value Date   CREATININE 1.22 (H) 07/28/2016    Lab Results  Component Value Date   HGBA1C 9.2 07/28/2016    Lab Results  Component Value Date   TSH 1.810 11/07/2014       Component Value Date/Time   CHOL 158 07/28/2016 1248     CHOL 131 08/27/2015 1158   HDL 52 07/28/2016 1248   HDL 40 08/27/2015 1158   CHOLHDL 3.0 07/28/2016 1248   VLDL 20 07/28/2016 1248   LDLCALC 86 07/28/2016 1248   LDLCALC 75 08/27/2015 1158    Lab Results  Component Value Date   AST 25 07/28/2016   Lab Results  Component Value Date   ALT 16 07/28/2016    Assessment & Plan:    1. Phimosis  - We discussed doing nothing at this time until worsens, a dorsal slit, a circumcision or trying a steroid cream for a short period of time   - he would like to try a steroid cream twice daily for 2 weeks  - RTC in 2 weeks for recheck  2. BPH with LUTS  - IPSS score is 18/3, it is worsening ? Due to phimosis  - Continue conservative management, avoiding bladder irritants and timed voiding's    Return in about 2 weeks (around 09/17/2016) for recheck fore skin.  These notes generated with voice recognition software. I apologize for typographical errors.  Zara Council, [facility] [address], [place], [place] [address] [phone]

## 2016-09-03 ENCOUNTER — Ambulatory Visit (INDEPENDENT_AMBULATORY_CARE_PROVIDER_SITE_OTHER): Payer: Medicare PPO | Admitting: Urology

## 2016-09-03 ENCOUNTER — Encounter: Payer: Self-pay | Admitting: Urology

## 2016-09-03 VITALS — BP 138/85 | HR 96 | Ht 69.0 in | Wt 287.6 lb

## 2016-09-03 DIAGNOSIS — N400 Enlarged prostate without lower urinary tract symptoms: Secondary | ICD-10-CM

## 2016-09-03 DIAGNOSIS — N471 Phimosis: Secondary | ICD-10-CM

## 2016-09-03 DIAGNOSIS — N401 Enlarged prostate with lower urinary tract symptoms: Secondary | ICD-10-CM | POA: Diagnosis not present

## 2016-09-03 DIAGNOSIS — N138 Other obstructive and reflux uropathy: Secondary | ICD-10-CM | POA: Diagnosis not present

## 2016-09-03 LAB — BLADDER SCAN AMB NON-IMAGING: SCAN RESULT: 29

## 2016-09-03 MED ORDER — CLOTRIMAZOLE-BETAMETHASONE 1-0.05 % EX CREA: 1.0000 "application " | TOPICAL_CREAM | Freq: Two times a day (BID) | CUTANEOUS | 0 refills | Status: DC

## 2016-09-08 ENCOUNTER — Encounter: Payer: Medicare PPO | Admitting: Dietician

## 2016-09-08 ENCOUNTER — Encounter: Payer: Self-pay | Admitting: Dietician

## 2016-09-08 VITALS — Ht 69.0 in | Wt 286.9 lb

## 2016-09-08 DIAGNOSIS — E1165 Type 2 diabetes mellitus with hyperglycemia: Secondary | ICD-10-CM

## 2016-09-08 DIAGNOSIS — Z713 Dietary counseling and surveillance: Secondary | ICD-10-CM | POA: Diagnosis not present

## 2016-09-08 NOTE — Progress Notes (Signed)
Appt. Start Time: 1730 Appt. End Time: 2030  Patient was given a One Touch Verio Flex meter with instruction on use by RN; he was able to give a return demo. BG was tested at 103mg /dl at 2:35pm.   Class 1 Diabetes Overview - define DM; state own type of DM; identify functions of pancreas and insulin; define insulin deficiency vs insulin resistance  Psychosocial - identify DM as a source of stress; state the effects of stress on BG control; verbalize appropriate stress management techniques; identify personal stress issues   Nutritional Management - describe effects of food on blood glucose; identify sources of carbohydrate, protein and fat; verbalize the importance of balance meals in controlling blood glucose; identify meals as well balanced or not; estimate servings of carbohydrate from menus; use food labels to identify servings size, content of carbohydrate, fiber, protein, fat, saturated fat and sodium; recognize food sources of fat, saturated fat, trans fat, sodium and verbalize goals for intake; describe healthful appropriate food choices when dining out   Exercise - describe the effects of exercise on blood glucose and importance of regular exercise in controlling diabetes; state a plan for personal exercise; verbalize contraindications for exercise  Self-Monitoring - state importance of HBGM and demo procedure accurately; use HBGM results to effectively manage diabetes; identify importance of regular HbA1C testing and goals for results  Acute Complications/Sick Day Guidelines - recognize hyperglycemia and hypoglycemia with causes and effects; identify blood glucose results as high, low or in control; list steps in treating and preventing high and low blood glucose; state appropriate measure to manage blood glucose when ill (need for meds, HBGM plan, when to call physician, need for fluids)  Chronic Complications/Foot, Skin, Eye Dental Care - identify possible long-term complications of  diabetes (retinopathy, neuropathy, nephropathy, cardiovascular disease, infections); explain steps in prevention and treatment of chronic complications; state importance of daily self-foot exams; describe how to examine feet and what to look for; explain appropriate eye and dental care  Lifestyle Changes/Goals & Health/Community Resources - state benefits of making appropriate lifestyle changes; identify habits that need to change (meals, tobacco, alcohol); identify strategies to reduce risk factors for personal health; set goals for proper diabetes care; state need for and frequency of healthcare follow-up; describe appropriate community resources for good health (ADA, web sites, apps)   Pregnancy/Sexual Health - define gestational diabetes; state importance of good blood glucose control and birth control prior to pregnancy; state importance of good blood glucose control in preventing sexual problems (impotence, vaginal dryness, infections, loss of desire); state relationship of blood glucose control and pregnancy outcome; describe risk of maternal and fetal complications  Teaching Materials Used: Class 1 Slides/Notebook Diabetes Booklet ID Card  Medic Alert/Medic ID Forms Sleep Evaluation Exercise Handout Daily Food Record Planning a Balanced Meal Goals for Class 1

## 2016-09-09 ENCOUNTER — Telehealth: Payer: Self-pay | Admitting: Emergency Medicine

## 2016-09-09 NOTE — Telephone Encounter (Signed)
Would like script sent to [facility] for supplies to a One touch [place]. Test strips and lancets at twice a day. Per [place]

## 2016-09-10 NOTE — Telephone Encounter (Signed)
Enter the order please

## 2016-09-11 ENCOUNTER — Other Ambulatory Visit: Payer: Self-pay | Admitting: Emergency Medicine

## 2016-09-11 NOTE — Progress Notes (Unsigned)
hh to

## 2016-09-11 NOTE — Telephone Encounter (Signed)
Script written out and faxed to [facility]

## 2016-09-12 ENCOUNTER — Other Ambulatory Visit: Payer: Self-pay | Admitting: Family Medicine

## 2016-09-12 DIAGNOSIS — E1165 Type 2 diabetes mellitus with hyperglycemia: Principal | ICD-10-CM

## 2016-09-12 DIAGNOSIS — E1140 Type 2 diabetes mellitus with diabetic neuropathy, unspecified: Secondary | ICD-10-CM

## 2016-09-12 DIAGNOSIS — N521 Erectile dysfunction due to diseases classified elsewhere: Secondary | ICD-10-CM

## 2016-09-12 DIAGNOSIS — E1129 Type 2 diabetes mellitus with other diabetic kidney complication: Secondary | ICD-10-CM

## 2016-09-12 DIAGNOSIS — R809 Proteinuria, unspecified: Principal | ICD-10-CM

## 2016-09-12 DIAGNOSIS — IMO0002 Reserved for concepts with insufficient information to code with codable children: Secondary | ICD-10-CM

## 2016-09-12 NOTE — Telephone Encounter (Signed)
Patient requesting refill of Metformin to [facility].  

## 2016-09-17 NOTE — Progress Notes (Signed)
09/18/2016 3:35 PM   Jordan [name] [birth year] [id number]  Referring provider: Steele Sizer, MD [address] [place], [place] [address]  Chief Complaint  Patient presents with  . Groin Swelling    phimosis    HPI: Patient is a 76 year old [place] American male who presents today for a recheck for the difficulty retracting his foreskin.  Difficulty retracting foreskin He states that he has been having difficulty retracting his foreskin for one month.  He denies any infections, but he feels it is difficult to stay clean.  Two weeks ago, he was instructed on foreskin hygiene and prescribed a steroid/antifungal cream to be applied twice a day.   He states the cream made no difference, but the phimosis has not worsened.  He has not had dysuria, gross hematuria or suprapubic pain.  He denies any fevers, chills, nausea or vomiting.      PMH: Past Medical History:  Diagnosis Date  . Chronic kidney disease, stage II (mild)   . Decreased libido   . Diabetes mellitus without complication ([place])   . Glaucoma   . Gout 2009  . Heart murmur   . Hernia [id number]  . Hyperlipidemia   . Hypertension   . Inguinal hernia without mention of obstruction or gangrene, unilateral or unspecified, (not specified as recurrent)   . Lumbago   . Obesity   . Unspecified constipation     Surgical History: Past Surgical History:  Procedure Laterality Date  . cataract surgery    . COLONOSCOPY  2012  . EYE SURGERY  2011   cataract  . UMBILICAL HERNIA REPAIR  2011    Home Medications:  Allergies as of 09/18/2016      Reactions   Ace Inhibitors Other (See Comments)   angioedema      Medication List       Accurate as of 09/18/16  3:35 PM. Always use your most recent med list.          allopurinol 300 MG tablet Commonly known as:  ZYLOPRIM Take 1 tablet (300 mg total) by mouth daily.   amLODipine 2.5 MG tablet Commonly known as:  NORVASC Take 1 tablet (2.5 mg total)  by mouth daily.   aspirin 81 MG tablet Take 162 mg by mouth daily.   atorvastatin 40 MG tablet Commonly known as:  LIPITOR Take 1 tablet (40 mg total) by mouth daily at 6 PM.   clotrimazole-betamethasone cream Commonly known as:  LOTRISONE Apply 1 application topically 2 (two) times daily.   FISH OIL PO Take 1 capsule by mouth 2 (two) times daily.   indapamide 1.25 MG tablet Commonly known as:  LOZOL Take 1 tablet (1.25 mg total) by mouth every other day.   JARDIANCE 25 MG Tabs tablet Generic drug:  empagliflozin Take 25 mg by mouth daily.   latanoprost 0.005 % ophthalmic solution Commonly known as:  XALATAN Place 1 drop into the left eye at bedtime.   metFORMIN 500 MG 24 hr tablet Commonly known as:  GLUCOPHAGE-XR TAKE 4 TABLETS (2,000 MG TOTAL) BY MOUTH DAILY WITH BREAKFAST.   nateglinide 60 MG tablet Commonly known as:  STARLIX Take 1 tablet (60 mg total) by mouth 3 (three) times daily with meals.   niacin 100 MG tablet Take 100 mg by mouth at bedtime.   polyethylene glycol packet Commonly known as:  MIRALAX / GLYCOLAX Take 17 g by mouth daily as needed.       Allergies:  Allergies  Allergen Reactions  . Ace Inhibitors Other (See Comments)    angioedema    Family History: Family History  Problem Relation Age of Onset  . Heart disease Father   . Hypertension Father   . Diabetes Sister   . Diabetes Brother   . Prostate cancer Brother   . Diabetes Sister   . Kidney disease Neg Hx   . Kidney cancer Neg Hx   . Bladder Cancer Neg Hx     Social History:  reports that he quit smoking about 40 years ago. His smoking use included Cigarettes. He started smoking about 51 years ago. He has a 10.00 pack-year smoking history. He has never used smokeless tobacco. He reports that he drinks about 0.6 oz of alcohol per week . He reports that he does not use drugs.  ROS: UROLOGY Frequent Urination?: Yes Hard to postpone urination?: No Burning/pain with  urination?: No Get up at night to urinate?: Yes Leakage of urine?: No Urine stream starts and stops?: No Trouble starting stream?: No Do you have to strain to urinate?: No Blood in urine?: No Urinary tract infection?: No Sexually transmitted disease?: No Injury to kidneys or bladder?: No Painful intercourse?: No Weak stream?: No Erection problems?: No Penile pain?: No  Gastrointestinal Nausea?: No Vomiting?: No Indigestion/heartburn?: No Diarrhea?: No Constipation?: No  Constitutional Fever: No Night sweats?: No Weight loss?: No Fatigue?: No  Skin Skin rash/lesions?: No Itching?: No  Eyes Blurred vision?: No Double vision?: No  Ears/Nose/Throat Sore throat?: No Sinus problems?: No  Hematologic/Lymphatic Swollen glands?: No Easy bruising?: No  Cardiovascular Leg swelling?: No Chest pain?: No  Respiratory Cough?: No Shortness of breath?: No  Endocrine Excessive thirst?: No  Musculoskeletal Back pain?: No Joint pain?: No  Neurological Headaches?: No Dizziness?: No  Psychologic Depression?: No Anxiety?: No  Physical Exam: BP (!) 153/73   Pulse (!) 102   Ht 5\' 9"  (1.753 m)   Wt 280 lb (127 kg)   BMI 41.35 kg/m   Constitutional: Well nourished. Alert and oriented, No acute distress. HEENT: [place] AT, moist mucus membranes. Trachea midline, no masses. Cardiovascular: No clubbing, cyanosis, or edema. Respiratory: Normal respiratory effort, no increased work of breathing. GI: Abdomen is soft, non tender, non distended, no abdominal masses. Liver and spleen not palpable.  No hernias appreciated.  Stool sample for occult testing is not indicated.   GU: No CVA tenderness.  No bladder fullness or masses.  Patient with uncircumcised phallus. Foreskin not able to be retracted Urethral meatus is patent.  No penile discharge. No penile lesions or rashes. Scrotum without lesions, cysts, rashes and/or edema.  Testicles are located scrotally bilaterally. Right  testicle is no longer enlarged and firm.  Non tender.  No erythema.  No masses are appreciated in the testicles. Left and right epididymis are normal. Rectal: Not performed today. Skin: No rashes, bruises or suspicious lesions. Lymph: No cervical or inguinal adenopathy. Neurologic: Grossly intact, no focal deficits, moving all 4 extremities. Psychiatric: Normal mood and affect.  Laboratory Data: Lab Results  Component Value Date   WBC 4.9 07/28/2016   HGB 14.4 07/28/2016   HCT 44.7 07/28/2016   MCV 88.7 07/28/2016   PLT 198 07/28/2016    Lab Results  Component Value Date   CREATININE 1.22 (H) 07/28/2016    Lab Results  Component Value Date   HGBA1C 9.2 07/28/2016    Lab Results  Component Value Date   TSH 1.810 11/07/2014  Component Value Date/Time   CHOL 158 07/28/2016 1248   CHOL 131 08/27/2015 1158   HDL 52 07/28/2016 1248   HDL 40 08/27/2015 1158   CHOLHDL 3.0 07/28/2016 1248   VLDL 20 07/28/2016 1248   LDLCALC 86 07/28/2016 1248   LDLCALC 75 08/27/2015 1158    Lab Results  Component Value Date   AST 25 07/28/2016   Lab Results  Component Value Date   ALT 16 07/28/2016    I have reviewed the labs  Assessment & Plan:    1. Phimosis  - We discussed doing nothing at this time until worsens, a dorsal slit, a circumcision or trying a steroid cream for a short period of time   - steroid cream not effective  - he would like to have a circumcision in the fall  - RTC in September      Return for in September for IPSS and exam.  These notes generated with voice recognition software. I apologize for typographical errors.  Zara Council, [facility] [address] [place], [place] [address] [phone]

## 2016-09-18 ENCOUNTER — Encounter: Payer: Self-pay | Admitting: Urology

## 2016-09-18 ENCOUNTER — Telehealth: Payer: Self-pay | Admitting: Emergency Medicine

## 2016-09-18 ENCOUNTER — Ambulatory Visit (INDEPENDENT_AMBULATORY_CARE_PROVIDER_SITE_OTHER): Payer: Medicare PPO | Admitting: Urology

## 2016-09-18 ENCOUNTER — Telehealth: Payer: Self-pay | Admitting: Family Medicine

## 2016-09-18 VITALS — BP 153/73 | HR 102 | Ht 69.0 in | Wt 280.0 lb

## 2016-09-18 DIAGNOSIS — N471 Phimosis: Secondary | ICD-10-CM | POA: Diagnosis not present

## 2016-09-18 NOTE — Telephone Encounter (Signed)
Pt requesting return call. States Mcarthur Rossetti has been sending a request for him to get a meter along with testing strips and needles. Please fax the prescription to [phone]

## 2016-09-18 NOTE — Telephone Encounter (Signed)
Went to get refill on Jardiance. With insurance it was still $165.00. Cannot afford. Please send a new script to pharmacy.

## 2016-09-19 ENCOUNTER — Other Ambulatory Visit: Payer: Self-pay | Admitting: Family Medicine

## 2016-09-19 DIAGNOSIS — IMO0002 Reserved for concepts with insufficient information to code with codable children: Secondary | ICD-10-CM

## 2016-09-19 DIAGNOSIS — E1129 Type 2 diabetes mellitus with other diabetic kidney complication: Secondary | ICD-10-CM

## 2016-09-19 DIAGNOSIS — E1165 Type 2 diabetes mellitus with hyperglycemia: Principal | ICD-10-CM

## 2016-09-19 DIAGNOSIS — R809 Proteinuria, unspecified: Principal | ICD-10-CM

## 2016-09-19 MED ORDER — ACCU-CHEK AVIVA PLUS W/DEVICE KIT: 1.0000 | PACK | Freq: Two times a day (BID) | 0 refills | Status: DC

## 2016-09-19 MED ORDER — GLIPIZIDE ER 5 MG PO TB24: 5.0000 mg | ORAL_TABLET | Freq: Every day | ORAL | 0 refills | Status: DC

## 2016-09-19 MED ORDER — GLUCOSE BLOOD VI STRP: ORAL_STRIP | 12 refills | Status: DC

## 2016-09-19 NOTE — Telephone Encounter (Signed)
Spoke to patient, he requests tier 1 medication, he has seen dietician and has changed his diet. Discussed risk of Glipizide, he is willing to start taking it again, continue metformin, log glucose and return in one month for follow up

## 2016-09-19 NOTE — Telephone Encounter (Signed)
Please return pt call once this has been completed. He is afraid that he will run out before the weekend. States that it will have to be sent to [facility] because it will take to long to receive it from [place]

## 2016-09-23 ENCOUNTER — Other Ambulatory Visit: Payer: Self-pay

## 2016-09-23 ENCOUNTER — Encounter: Payer: Self-pay | Admitting: Family Medicine

## 2016-09-23 DIAGNOSIS — N521 Erectile dysfunction due to diseases classified elsewhere: Principal | ICD-10-CM

## 2016-09-23 DIAGNOSIS — E1140 Type 2 diabetes mellitus with diabetic neuropathy, unspecified: Secondary | ICD-10-CM

## 2016-09-23 MED ORDER — ACCU-CHEK AVIVA PLUS W/DEVICE KIT: 1.0000 | PACK | Freq: Two times a day (BID) | 12 refills | Status: DC

## 2016-09-24 ENCOUNTER — Telehealth: Payer: Self-pay

## 2016-09-24 NOTE — Telephone Encounter (Signed)
He is off Starlix because of cost, only on Glipizide and metformin now

## 2016-09-24 NOTE — Telephone Encounter (Signed)
Patient was told at the pharmacy that he is taking the same kind of medication, is he suppose to be taking Starlix, Glipizide and Metformin?

## 2016-09-25 NOTE — Telephone Encounter (Signed)
Spoke with patient and notified him and called [facility] to inform the pharmacist Juanda Crumble to D/C Starlix as well.

## 2016-10-14 ENCOUNTER — Encounter: Payer: Self-pay | Admitting: *Deleted

## 2016-10-21 ENCOUNTER — Telehealth: Payer: Self-pay | Admitting: Family Medicine

## 2016-10-22 ENCOUNTER — Other Ambulatory Visit: Payer: Self-pay

## 2016-10-22 NOTE — Telephone Encounter (Signed)
Pt already had his 6 month follow visit scheduled for November.  However he did go ahead and schedule a visit for next month (per your request).

## 2016-10-22 NOTE — Telephone Encounter (Signed)
Patient requesting Glipizide please be a 90 day supply.

## 2016-11-03 ENCOUNTER — Ambulatory Visit: Payer: Medicare PPO | Admitting: Podiatry

## 2016-11-19 ENCOUNTER — Other Ambulatory Visit: Payer: Self-pay | Admitting: Family Medicine

## 2016-11-19 ENCOUNTER — Ambulatory Visit (INDEPENDENT_AMBULATORY_CARE_PROVIDER_SITE_OTHER): Payer: Medicare PPO | Admitting: Family Medicine

## 2016-11-19 ENCOUNTER — Encounter: Payer: Self-pay | Admitting: Family Medicine

## 2016-11-19 VITALS — BP 128/78 | HR 103 | Temp 98.2°F | Resp 18 | Ht 69.0 in | Wt 289.1 lb

## 2016-11-19 DIAGNOSIS — Z23 Encounter for immunization: Secondary | ICD-10-CM | POA: Diagnosis not present

## 2016-11-19 DIAGNOSIS — E1140 Type 2 diabetes mellitus with diabetic neuropathy, unspecified: Secondary | ICD-10-CM | POA: Diagnosis not present

## 2016-11-19 DIAGNOSIS — E782 Mixed hyperlipidemia: Secondary | ICD-10-CM | POA: Diagnosis not present

## 2016-11-19 DIAGNOSIS — I350 Nonrheumatic aortic (valve) stenosis: Secondary | ICD-10-CM | POA: Diagnosis not present

## 2016-11-19 DIAGNOSIS — I10 Essential (primary) hypertension: Secondary | ICD-10-CM

## 2016-11-19 DIAGNOSIS — R809 Proteinuria, unspecified: Secondary | ICD-10-CM | POA: Diagnosis not present

## 2016-11-19 DIAGNOSIS — N521 Erectile dysfunction due to diseases classified elsewhere: Secondary | ICD-10-CM | POA: Diagnosis not present

## 2016-11-19 DIAGNOSIS — E1129 Type 2 diabetes mellitus with other diabetic kidney complication: Secondary | ICD-10-CM

## 2016-11-19 LAB — POCT GLYCOSYLATED HEMOGLOBIN (HGB A1C): Hemoglobin A1C: 7.4

## 2016-11-19 MED ORDER — GLIPIZIDE ER 5 MG PO TB24: ORAL_TABLET | ORAL | 1 refills | Status: DC

## 2016-11-19 NOTE — Progress Notes (Signed)
Name: Jordan [name]   MRN: [id number]    DOB: [birth year]   Date:11/19/2016       Progress Note  Subjective  Chief Complaint  Chief Complaint  Patient presents with  . Diabetes    176 high 136 low checks daily  . Medication Refill  . Gout    no issues  . Obesity  . Hyperlipidemia    HPI  DM with microalbuminuria and ED: he is back on Glipizide and Metformin, since other medications were too expensive and hgbA1C was up to 9.2%. HgbA1C wast 7.9% and is up to 9.2% and is down to 7.4%  today. He is off sweet tea and no longer snacking on grapes throughout the night.  He denies polyphagia, but has polydipsia and polyuria. 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 He needs to have yearly eye exam   BPH: seen by [facility] last year because of testicular mass, diagnosed with hydrocele and small fat containing left inguinal hernia.  HTN: well controlled with medication , no chest pain, SOB or palpitation. He denies orthostatic changes. Denies side effects of medications  Hyperlipidemia: taking lipitor and denies side effects, no myalgia. Last labs reviewed with patient   Obesity: he has gained weight since last visit, likely from going back on Glipizide, discussed life style modification, he is walking 5 times a week for at least 30 minutes  Aortic Valve stenosis: we will send him back to Dr. Fletcher Anon, no syncope or chest pain   Patient Active Problem List   Diagnosis Date Noted  . Bilateral hydrocele 11/27/2015  . Mass, scrotum 11/27/2015  . Ventral hernia without obstruction or gangrene 11/27/2015  . Controlled type 2 diabetes mellitus with microalbuminuria ([place]) 10/26/2015  . 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 Hyperlipidemia 11/07/2014  . Essential hypertension 11/07/2014  . Obesity, Class III, BMI 40-49.9 (morbid obesity) ([place]) 11/07/2014  . Gout 11/07/2014  . BPH with obstruction/lower urinary  tract symptoms 10/26/2014  . Epistaxis 09/21/2014  . 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 Aortic valve stenosis, mild 12/02/2012  . Constipation 10/26/2012  . Incisional hernia, without obstruction or gangrene 10/26/2012  . Left inguinal hernia     Past Surgical History:  Procedure Laterality Date  . cataract surgery    . COLONOSCOPY  2012  . EYE SURGERY  2011   cataract  . UMBILICAL HERNIA REPAIR  2011    Family History  Problem Relation Age of Onset  . Heart disease Father   . Hypertension Father   . Diabetes Sister   . Diabetes Brother   . Prostate cancer Brother   . Diabetes Sister   . Kidney disease Neg Hx   . Kidney cancer Neg Hx   . Bladder Cancer Neg Hx     Social History   Social History  . Marital status: Widowed    Spouse name: N/A  . Number of children: N/A  . Years of education: N/A   Occupational History  . Not on file.   Social History Main Topics  . Smoking status: Former Smoker    Packs/day: 1.00    Years: 10.00    Types: Cigarettes    Start date: 03/17/1965    Quit date: 11/27/1975  . Smokeless tobacco: Never Used     Comment: quit 40 years  . Alcohol use 0.6 oz/week    1 Standard drinks or equivalent per week     Comment: socially - 1 x  year  . Drug use: No  . Sexual activity: Yes    Partners: Female   Other Topics Concern  . Not on file   Social History Narrative  . No narrative on file     Current Outpatient Prescriptions:  .  allopurinol (ZYLOPRIM) 300 MG tablet, Take 1 tablet (300 mg total) by mouth daily., Disp: 90 tablet, Rfl: 3 .  amLODipine (NORVASC) 2.5 MG tablet, Take 1 tablet (2.5 mg total) by mouth daily., Disp: 90 tablet, Rfl: 2 .  aspirin 81 MG tablet, Take 162 mg by mouth daily., Disp: , Rfl:  .  atorvastatin (LIPITOR) 40 MG tablet, Take 1 tablet (40 mg total) by mouth daily at 6 PM., Disp: 90 tablet, Rfl: 3 .  Blood Glucose Monitoring Suppl (ACCU-CHEK AVIVA PLUS) w/Device KIT, 1 each by Does not apply route 2 (two) times  daily. Use as directed, Disp: 100 kit, Rfl: 12 .  clotrimazole-betamethasone (LOTRISONE) cream, Apply 1 application topically 2 (two) times daily., Disp: 30 g, Rfl: 0 .  glipiZIDE (GLIPIZIDE XL) 5 MG 24 hr tablet, TAKE 1 TABLET BY MOUTH ONCE DAILY WITH BREAKFAST, Disp: 90 tablet, Rfl: 1 .  glucose blood (ACCU-CHEK AVIVA PLUS) test strip, Use as instructed, Disp: 100 each, Rfl: 12 .  indapamide (LOZOL) 1.25 MG tablet, Take 1 tablet (1.25 mg total) by mouth every other day., Disp: 90 tablet, Rfl: 3 .  latanoprost (XALATAN) 0.005 % ophthalmic solution, Place 1 drop into the left eye at bedtime. , Disp: , Rfl:  .  metFORMIN (GLUCOPHAGE-XR) 500 MG 24 hr tablet, TAKE 4 TABLETS (2,000 MG TOTAL) BY MOUTH DAILY WITH BREAKFAST., Disp: 360 tablet, Rfl: 2 .  niacin 100 MG tablet, Take 100 mg by mouth at bedtime., Disp: , Rfl:  .  Omega-3 Fatty Acids (FISH OIL PO), Take 1 capsule by mouth 2 (two) times daily. , Disp: , Rfl:  .  polyethylene glycol (MIRALAX / GLYCOLAX) packet, Take 17 g by mouth daily as needed., Disp: , Rfl:   Allergies  Allergen Reactions  . Ace Inhibitors Other (See Comments)    angioedema     ROS  Constitutional: Negative for fever or weight change.  Respiratory: Negative for cough and shortness of breath.   Cardiovascular: Negative for chest pain or palpitations.  Gastrointestinal: Negative for abdominal pain, no bowel changes.  Musculoskeletal: Negative for gait problem or joint swelling.  Skin: Negative for rash.  Neurological: Negative for dizziness or headache.  No other specific complaints in a complete review of systems (except as listed in HPI above).  Objective  Vitals:   11/19/16 0955  BP: 128/78  Pulse: (!) 103  Resp: 18  Temp: 98.2 F (36.8 C)  SpO2: 97%  Weight: 289 lb 1 oz (131.1 kg)  Height: _0  (1.753 m)    Body mass index is 42.69 kg/m.  Physical Exam  Constitutional: Patient appears well-developed and well-nourished. Obese  No distress.   HEENT: head atraumatic, normocephalic, pupils equal and reactive to light, neck supple, throat within normal limits Cardiovascular: Normal rate, regular rhythm and normal heart sounds.  Positive for harsh SEM 3/6 louder on 2nd right intercostal space. No BLE edema. Pulmonary/Chest: Effort normal and breath sounds normal. No respiratory distress. Abdominal: Soft.  There is no tenderness. Psychiatric: Patient has a normal mood and affect. behavior is normal. Judgment and thought content normal.  Recent Results (from the past 2160 hour(s))  BLADDER SCAN AMB NON-IMAGING     Status: None  Collection Time: 09/03/16 10:36 AM  Result Value Ref Range   Scan Result 29   POCT HgB A1C     Status: Abnormal   Collection Time: 11/19/16 10:01 AM  Result Value Ref Range   Hemoglobin A1C 7.4     Diabetic Foot Exam: Diabetic Foot Exam - Simple   Simple Foot Form Diabetic Foot exam was performed with the following findings:  Yes 11/19/2016 10:23 AM  Visual Inspection See comments:  Yes Sensation Testing Intact to touch and monofilament testing bilaterally:  Yes Pulse Check Posterior Tibialis and Dorsalis pulse intact bilaterally:  Yes Comments Hammer toe, seeing podiatrist tomorrow      PHQ2/9: Depression screen [facility] 2/9 08/19/2016 07/28/2016 03/28/2016 11/27/2015 10/26/2015  Decreased Interest 0 0 0 0 1  Down, Depressed, Hopeless 0 0 0 0 0  PHQ - 2 Score 0 0 0 0 1     Fall Risk: Fall Risk  09/08/2016 08/19/2016 07/28/2016 03/28/2016 11/27/2015  Falls in the past year? No No No No Yes  Number falls in past yr: - - - - 1  Injury with Fall? - - - - No  Risk for fall due to : - - - - -  Follow up - - - - -     Assessment & Plan  1. 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 Obesity, Class III, BMI 40-49.9 (morbid obesity)  ([place])  Discussed with the patient the risk posed by an increased BMI. Discussed importance of portion control, calorie counting and at least 150 minutes of physical activity weekly. Avoid sweet beverages and drink more water. Eat at least 6 servings of fruit and vegetables daily   3. Essential hypertension  At goal , continue medication   4. Mixed hyperlipidemia  On statin therapy and life style modification   5. Needs flu shot  - Flu vaccine HIGH DOSE PF  6. Need for vaccination for pneumococcus  - Pneumococcal conjugate vaccine 13-valent IM  7. Diabetes mellitus with microalbuminuria (HCC)  Check urine micro  8. Aortic valve stenosis, mild  - Ambulatory referral to Cardiology

## 2016-11-20 ENCOUNTER — Ambulatory Visit (INDEPENDENT_AMBULATORY_CARE_PROVIDER_SITE_OTHER): Payer: Medicare PPO | Admitting: Podiatry

## 2016-11-20 ENCOUNTER — Encounter: Payer: Self-pay | Admitting: Podiatry

## 2016-11-20 DIAGNOSIS — M79676 Pain in unspecified toe(s): Secondary | ICD-10-CM

## 2016-11-20 DIAGNOSIS — L84 Corns and callosities: Secondary | ICD-10-CM

## 2016-11-20 DIAGNOSIS — E1142 Type 2 diabetes mellitus with diabetic polyneuropathy: Secondary | ICD-10-CM | POA: Diagnosis not present

## 2016-11-20 DIAGNOSIS — B351 Tinea unguium: Secondary | ICD-10-CM | POA: Diagnosis not present

## 2016-11-20 NOTE — Progress Notes (Signed)
Complaint:  Visit Type: Patient returns to my office for continued preventative foot care services. Complaint: Patient states" my nails have grown long and thick and become painful to walk and wear shoes" Patient has been diagnosed with DM with no foot complications. The patient presents for preventative foot care services. No changes to ROS.  Painful corn second toe right foot.  Podiatric Exam: Vascular: dorsalis pedis and posterior tibial pulses are palpable bilateral. Capillary return is immediate. Temperature gradient is WNL. Skin turgor WNL  Sensorium: Normal Semmes Weinstein monofilament test. Normal tactile sensation bilaterally. Nail Exam: Pt has thick disfigured discolored nails with subungual debris noted bilateral entire nail hallux through fifth toenails Ulcer Exam: There is no evidence of ulcer or pre-ulcerative changes or infection. Orthopedic Exam: Muscle tone and strength are WNL. No limitations in general ROM. No crepitus or effusions noted. HAV  B/L. Hammer toes second  B/L Skin: No Porokeratosis. No infection or ulcers.  Corn second toe right foot.  Callus left hallux.  Diagnosis:  Onychomycosis, , Pain in right toe, pain in left toes, Corn/callus  Treatment & Plan Procedures and Treatment: Consent by patient was obtained for treatment procedures. The patient understood the discussion of treatment and procedures well. All questions were answered thoroughly reviewed. Debridement of mycotic and hypertrophic toenails, 1 through 5 bilateral and clearing of subungual debris. No ulceration, no infection noted. Debride corn second toe right.  Padding dispensed. Return Visit-Office Procedure: Patient instructed to return to the office for a follow up visit 3 months for continued evaluation and treatment.    Gardiner Barefoot DPM

## 2016-11-20 NOTE — Telephone Encounter (Signed)
Patient requesting refill of Accu-Chek to [facility].  

## 2016-11-23 NOTE — Progress Notes (Signed)
11/24/2016 3:33 PM   Jordan [name] 1940/11/19 [id number]  Referring provider: Steele Sizer, MD [address] [place], [place] [address]  Chief Complaint  Patient presents with  . Follow-up    2 months Phimosis    HPI: Patient is a 76 year old African American male with a history of phimosis who presents today for follow-up.     Difficulty retracting foreskin He states that he has been having difficulty retracting his foreskin for one month.  He denies any infections, but he feels it is difficult to stay clean.  Two weeks ago, he was instructed on foreskin hygiene and prescribed a steroid/antifungal cream to be applied twice a day.  He states the cream made no difference, but the phimosis has not worsened.  He has not had dysuria, gross hematuria or suprapubic pain.  He denies any fevers, chills, nausea or vomiting.    BPH WITH LUTS  (prostate and/or bladder) His IPSS score today is 12, which is moderate lower urinary tract symptomatology. He is mixed with his quality life due to his urinary symptoms.  His previous IPSS score was 18/3.  His previous PVR is 29 mL.  His major complaints today are frequency, urgency and nocturia.  He has had these symptoms for a few years.  He denies any dysuria, hematuria or suprapubic pain.    He also denies any recent fevers, chills, nausea or vomiting.  His brother has a history of prostate cancer.      IPSS    Row Name 11/24/16 1400         International Prostate Symptom Score   How often have you had the sensation of not emptying your bladder? Less than 1 in 5     How often have you had to urinate less than every two hours? Less than half the time     How often have you found you stopped and started again several times when you urinated? Not at All     How often have you found it difficult to postpone urination? Almost always     How often have you had a weak urinary stream? Less than half the time     How often have  you had to strain to start urination? Not at All     How many times did you typically get up at night to urinate? 2 Times     Total IPSS Score 12       Quality of Life due to urinary symptoms   If you were to spend the rest of your life with your urinary condition just the way it is now how would you feel about that? Mixed        Score:  1-7 Mild 8-19 Moderate 20-35 Severe     PMH: Past Medical History:  Diagnosis Date  . Chronic kidney disease, stage II (mild)   . Decreased libido   . Diabetes mellitus without complication ([place])   . Glaucoma   . Gout 2009  . Heart murmur   . Hernia 6283,6629  . Hyperlipidemia   . Hypertension   . Inguinal hernia without mention of obstruction or gangrene, unilateral or unspecified, (not specified as recurrent)   . Lumbago   . Obesity   . Unspecified constipation     Surgical History: Past Surgical History:  Procedure Laterality Date  . cataract surgery    . COLONOSCOPY  2012  . EYE SURGERY  2011   cataract  .  UMBILICAL HERNIA REPAIR  2011    Home Medications:  Allergies as of 11/24/2016      Reactions   Ace Inhibitors Other (See Comments)   angioedema      Medication List       Accurate as of 11/24/16  3:33 PM. Always use your most recent med list.          ACCU-CHEK AVIVA PLUS w/Device Kit 1 each by Does not apply route 2 (two) times daily. Use as directed   ACCU-CHEK FASTCLIX LANCETS Misc TEST BLOOD SUGAR ONCE DAILY   allopurinol 300 MG tablet Commonly known as:  ZYLOPRIM Take 1 tablet (300 mg total) by mouth daily.   amLODipine 2.5 MG tablet Commonly known as:  NORVASC Take 1 tablet (2.5 mg total) by mouth daily.   aspirin 81 MG tablet Take 162 mg by mouth daily.   atorvastatin 40 MG tablet Commonly known as:  LIPITOR Take 1 tablet (40 mg total) by mouth daily at 6 PM.   clotrimazole-betamethasone cream Commonly known as:  LOTRISONE Apply 1 application topically 2 (two) times daily.   FISH OIL  PO Take 1 capsule by mouth 2 (two) times daily.   glipiZIDE 5 MG 24 hr tablet Commonly known as:  GLIPIZIDE XL TAKE 1 TABLET BY MOUTH ONCE DAILY WITH BREAKFAST   glucose blood test strip Commonly known as:  ACCU-CHEK AVIVA PLUS Use as instructed   indapamide 1.25 MG tablet Commonly known as:  LOZOL Take 1 tablet (1.25 mg total) by mouth every other day.   latanoprost 0.005 % ophthalmic solution Commonly known as:  XALATAN Place 1 drop into the left eye at bedtime.   metFORMIN 500 MG 24 hr tablet Commonly known as:  GLUCOPHAGE-XR TAKE 4 TABLETS (2,000 MG TOTAL) BY MOUTH DAILY WITH BREAKFAST.   niacin 100 MG tablet Take 100 mg by mouth at bedtime.   polyethylene glycol packet Commonly known as:  MIRALAX / GLYCOLAX Take 17 g by mouth daily as needed.       Allergies:  Allergies  Allergen Reactions  . Ace Inhibitors Other (See Comments)    angioedema    Family History: Family History  Problem Relation Age of Onset  . Heart disease Father   . Hypertension Father   . Diabetes Sister   . Diabetes Brother   . Prostate cancer Brother   . Diabetes Sister   . Kidney disease Neg Hx   . Kidney cancer Neg Hx   . Bladder Cancer Neg Hx     Social History:  reports that he quit smoking about 41 years ago. His smoking use included Cigarettes. He started smoking about 51 years ago. He has a 10.00 pack-year smoking history. He has never used smokeless tobacco. He reports that he drinks about 0.6 oz of alcohol per week . He reports that he does not use drugs.  ROS: UROLOGY Frequent Urination?: Yes Hard to postpone urination?: Yes Burning/pain with urination?: No Get up at night to urinate?: Yes Leakage of urine?: No Urine stream starts and stops?: No Trouble starting stream?: No Do you have to strain to urinate?: No Blood in urine?: No Urinary tract infection?: No Sexually transmitted disease?: No Injury to kidneys or bladder?: No Painful intercourse?: No Weak  stream?: No Erection problems?: Yes Penile pain?: No  Gastrointestinal Nausea?: No Vomiting?: No Indigestion/heartburn?: No Diarrhea?: No Constipation?: No  Constitutional Fever: No Night sweats?: No Weight loss?: No Fatigue?: No  Skin Skin rash/lesions?: No Itching?: No  Eyes Blurred vision?: No Double vision?: No  Ears/Nose/Throat Sore throat?: No Sinus problems?: No  Hematologic/Lymphatic Swollen glands?: No Easy bruising?: No  Cardiovascular Leg swelling?: No Chest pain?: No  Respiratory Cough?: No Shortness of breath?: No  Endocrine Excessive thirst?: No  Musculoskeletal Back pain?: No Joint pain?: No  Neurological Headaches?: No Dizziness?: No  Psychologic Depression?: No Anxiety?: No  Physical Exam: BP 125/73   Pulse 94   Ht '5\' 9"'  (1.753 m)   Wt 289 lb 8 oz (131.3 kg)   BMI 42.75 kg/m   Constitutional: Well nourished. Alert and oriented, No acute distress. HEENT: [place] AT, moist mucus membranes. Trachea midline, no masses. Cardiovascular: No clubbing, cyanosis, or edema. Respiratory: Normal respiratory effort, no increased work of breathing. GI: Abdomen is soft, non tender, non distended, no abdominal masses. Liver and spleen not palpable.  No hernias appreciated.  Stool sample for occult testing is not indicated.   GU: No CVA tenderness.  No bladder fullness or masses.  Patient with uncircumcised phallus. Foreskin not able to be retracted Urethral meatus is patent.  No penile discharge. No penile lesions or rashes. Scrotum without lesions, cysts, rashes and/or edema.  Testicles are located scrotally bilaterally. Right testicle is no longer enlarged and firm.  Non tender.  No erythema.  No masses are appreciated in the testicles. Left and right epididymis are normal. Rectal: Not performed today. Skin: No rashes, bruises or suspicious lesions. Lymph: No cervical or inguinal adenopathy. Neurologic: Grossly intact, no focal deficits, moving  all 4 extremities. Psychiatric: Normal mood and affect.  Laboratory Data: Lab Results  Component Value Date   WBC 4.9 07/28/2016   HGB 14.4 07/28/2016   HCT 44.7 07/28/2016   MCV 88.7 07/28/2016   PLT 198 07/28/2016    Lab Results  Component Value Date   CREATININE 1.22 (H) 07/28/2016    Lab Results  Component Value Date   HGBA1C 7.4 11/19/2016        Component Value Date/Time   CHOL 158 07/28/2016 1248   CHOL 131 08/27/2015 1158   HDL 52 07/28/2016 1248   HDL 40 08/27/2015 1158   CHOLHDL 3.0 07/28/2016 1248   VLDL 20 07/28/2016 1248   LDLCALC 86 07/28/2016 1248   LDLCALC 75 08/27/2015 1158    Lab Results  Component Value Date   AST 25 07/28/2016   Lab Results  Component Value Date   ALT 16 07/28/2016    I have reviewed the labs  Assessment & Plan:    1. Phimosis  - We discussed doing nothing at this time until worsens, a dorsal slit, a circumcision or trying a steroid cream for a short period of time   - not bothersome at this point  - RTC in September  2019  2. BPH with LUTS  - IPSS score is 12/3, it is improving  - Continue conservative management, avoiding bladder irritants and timed voiding's  - most bothersome symptoms is/are nocturia  - RTC in 12 months for IPSS and exam   3. Nocturia  - I explained to the patient that nocturia is often multi-factorial and difficult to treat.  Sleeping disorders, heart conditions, peripheral vascular disease, diabetes, an enlarged prostate for men, an urethral stricture causing bladder outlet obstruction and/or certain medications can contribute to nocturia.  - I have suggested that the patient avoid caffeine after noon and alcohol in the evening.  He or she may also benefit from fluid restrictions after 6:00 in the evening and voiding  just prior to bedtime.  - I have explained that research studies have showed that over 84% of patients with sleep apnea reported frequent nighttime urination.   With sleep apnea,  oxygen decreases, carbon dioxide increases, the blood become more acidic, the heart rate drops and blood vessels in the lung constrict.  The body is then alerted that something is very wrong. The sleeper must wake enough to reopen the airway. By this time, the heart is racing and experiences a false signal of fluid overload. The heart excretes a hormone-like protein that tells the body to get rid of sodium and water, resulting in nocturia.  -  I also informed the patient that a recent study noted that decreasing sodium intake to 2.3 grams daily, if they don't have issues with hyponatremia, can also reduce the number of nightly voids  - The patient may benefit from a discussion with his or her primary care physician to see if he or she has risk factors for sleep apnea or other sleep disturbances and obtaining a sleep study.   Return in about 1 year (around 11/24/2017) for IPSS and exam.  These notes generated with voice recognition software. I apologize for typographical errors.  Zara Council, [facility] [address], [place], [place] [address] [phone]

## 2016-11-24 ENCOUNTER — Encounter: Payer: Self-pay | Admitting: Urology

## 2016-11-24 ENCOUNTER — Ambulatory Visit: Payer: Medicare PPO | Admitting: Urology

## 2016-11-24 VITALS — BP 125/73 | HR 94 | Ht 69.0 in | Wt 289.5 lb

## 2016-11-24 DIAGNOSIS — R351 Nocturia: Secondary | ICD-10-CM | POA: Diagnosis not present

## 2016-11-24 DIAGNOSIS — N471 Phimosis: Secondary | ICD-10-CM | POA: Diagnosis not present

## 2016-11-24 DIAGNOSIS — N138 Other obstructive and reflux uropathy: Secondary | ICD-10-CM

## 2016-11-24 DIAGNOSIS — N401 Enlarged prostate with lower urinary tract symptoms: Secondary | ICD-10-CM

## 2016-12-15 ENCOUNTER — Telehealth: Payer: Self-pay | Admitting: Family Medicine

## 2016-12-15 NOTE — Telephone Encounter (Signed)
NEED NATEGLINIDE REFILLED.REQUEST HAS BEEN SENT BY PHARM. PHARM IS [facility].

## 2016-12-15 NOTE — Telephone Encounter (Signed)
Informed patient from our conversation on 09/24/16 he is not on Nateglinide any longer. He is only on Metformin and Glipizide.

## 2016-12-22 ENCOUNTER — Ambulatory Visit: Payer: Medicare PPO

## 2016-12-29 DIAGNOSIS — H401122 Primary open-angle glaucoma, left eye, moderate stage: Secondary | ICD-10-CM | POA: Diagnosis not present

## 2016-12-31 ENCOUNTER — Encounter: Payer: Self-pay | Admitting: Family Medicine

## 2017-01-26 ENCOUNTER — Ambulatory Visit: Payer: Medicare PPO | Admitting: Cardiovascular Disease

## 2017-01-28 ENCOUNTER — Ambulatory Visit: Payer: Medicare PPO | Admitting: Family Medicine

## 2017-02-18 ENCOUNTER — Encounter: Payer: Self-pay | Admitting: Family Medicine

## 2017-02-18 ENCOUNTER — Ambulatory Visit: Payer: Medicare PPO | Admitting: Family Medicine

## 2017-02-18 VITALS — BP 120/70 | HR 94 | Resp 14 | Ht 69.0 in | Wt 294.6 lb

## 2017-02-18 DIAGNOSIS — Z79899 Other long term (current) drug therapy: Secondary | ICD-10-CM | POA: Diagnosis not present

## 2017-02-18 DIAGNOSIS — Z23 Encounter for immunization: Secondary | ICD-10-CM

## 2017-02-18 DIAGNOSIS — E782 Mixed hyperlipidemia: Secondary | ICD-10-CM

## 2017-02-18 DIAGNOSIS — E1140 Type 2 diabetes mellitus with diabetic neuropathy, unspecified: Secondary | ICD-10-CM | POA: Diagnosis not present

## 2017-02-18 DIAGNOSIS — S90812A Abrasion, left foot, initial encounter: Secondary | ICD-10-CM

## 2017-02-18 DIAGNOSIS — I10 Essential (primary) hypertension: Secondary | ICD-10-CM | POA: Diagnosis not present

## 2017-02-18 DIAGNOSIS — N521 Erectile dysfunction due to diseases classified elsewhere: Secondary | ICD-10-CM | POA: Diagnosis not present

## 2017-02-18 MED ORDER — GLUCOSE BLOOD VI STRP: ORAL_STRIP | 12 refills | Status: DC

## 2017-02-18 MED ORDER — ACCU-CHEK FASTCLIX LANCETS MISC: 1.0000 | Freq: Two times a day (BID) | 2 refills | Status: DC

## 2017-02-18 MED ORDER — AMLODIPINE BESYLATE 2.5 MG PO TABS: 2.5000 mg | ORAL_TABLET | Freq: Every day | ORAL | 2 refills | Status: DC

## 2017-02-18 MED ORDER — GLIPIZIDE ER 5 MG PO TB24: ORAL_TABLET | ORAL | 1 refills | Status: DC

## 2017-02-18 MED ORDER — INDAPAMIDE 1.25 MG PO TABS: 1.2500 mg | ORAL_TABLET | ORAL | 3 refills | Status: DC

## 2017-02-18 NOTE — Progress Notes (Signed)
Name: Jordan [name]   MRN: [id number]    DOB: 1941/03/08   Date:02/18/2017       Progress Note  Subjective  Chief Complaint  Chief Complaint  Patient presents with  . Diabetes  . Hypertension  . Obesity    HPI  DM with microalbuminuria and ED: he is back on Glipizide and Metformin, since other medications were too expensive and hgbA1C was up to 9.2%. HgbA1C wast 7.9% and is up to 9.2% and is down to 7.4% , we will send him to the lab today.  He is off sweet tea and no longer snacking on grapes throughout the nigh, checking glucose at home and is around 130-140 fasting, not usually checking at the end of the day.  He denies polyphagia, but has polydipsia and polyuria. 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 Due for urine micro, eye exam is up to date  BPH: seen by [facility] last year because of testicular mass, diagnosed with hydrocele and small fat containing left inguinal hernia.   HTN: well controlled with medication , no chest pain, SOBor palpitation. He denies orthostatic changes. Denies side effects of medications, he is compliant with his medication  Hyperlipidemia: taking lipitor and denies side effects, no myalgia.   Obesity: he has gained weight since last visit, likely from going back on Glipizide, discussed life style modification, he is walking 5 times a week for at least 30-35  minutes  Aortic Valve stenosis: we will send him back to Dr. Fletcher Anon - he has an appointment in January 2019 , no syncope or chest pain  Foot injury: he was at the beach and stepped on a nail, it came off but still has a scab under his foot, it happened a couple weeks ago   Patient Active Problem List   Diagnosis Date Noted  . Bilateral hydrocele 11/27/2015  . Mass, scrotum 11/27/2015  . Ventral hernia without obstruction or gangrene 11/27/2015  . Controlled type 2 diabetes mellitus with microalbuminuria ([place]) 10/26/2015  . 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 Hyperlipidemia 11/07/2014  . Essential hypertension 11/07/2014  . Obesity, Class III, BMI 40-49.9 (morbid obesity) ([place]) 11/07/2014  . Gout 11/07/2014  . 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  . Epistaxis 09/21/2014  . 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 Aortic valve stenosis, mild 12/02/2012  . Constipation 10/26/2012  . Incisional hernia, without obstruction or gangrene 10/26/2012  . Left inguinal hernia     Past Surgical History:  Procedure Laterality Date  . cataract surgery    . COLONOSCOPY  2012  . EYE SURGERY  2011   cataract  . UMBILICAL HERNIA REPAIR  2011    Family History  Problem Relation Age of Onset  . Heart disease Father   . Hypertension Father   . Diabetes Sister   . Diabetes Brother   . Prostate cancer Brother   . Diabetes Sister   . Kidney disease Neg Hx   . Kidney cancer Neg Hx   . Bladder Cancer Neg Hx     Social History   Socioeconomic History  . Marital status: Widowed    Spouse name: Not on file  . Number of children: Not on file  . Years of education: Not on file  . Highest education level: Not on file  Social Needs  . Financial resource strain: Not on file  . Food insecurity - worry: Not on file  . Food insecurity -  inability: Not on file  . Transportation needs - medical: Not on file  . Transportation needs - non-medical: Not on file  Occupational History  . Not on file  Tobacco Use  . Smoking status: Former Smoker    Packs/day: 1.00    Years: 10.00    Pack years: 10.00    Types: Cigarettes    Start date: 03/17/1965    Last attempt to quit: 11/27/1975    Years since quitting: 41.2  . Smokeless tobacco: Never Used  . Tobacco comment: quit 40 years  Substance and Sexual Activity  . Alcohol use: Yes    Alcohol/week: 0.6 oz    Types: 1 Standard drinks or equivalent per week    Comment: socially - 1 x year  . Drug use: No  . Sexual activity: Yes    Partners:  Female  Other Topics Concern  . Not on file  Social History Narrative  . Not on file     Current Outpatient Medications:  .  ACCU-CHEK FASTCLIX LANCETS MISC, 1 each by Subdermal route 2 (two) times daily., Disp: 102 each, Rfl: 2 .  allopurinol (ZYLOPRIM) 300 MG tablet, Take 1 tablet (300 mg total) by mouth daily., Disp: 90 tablet, Rfl: 3 .  amLODipine (NORVASC) 2.5 MG tablet, Take 1 tablet (2.5 mg total) by mouth daily., Disp: 90 tablet, Rfl: 2 .  aspirin 81 MG tablet, Take 162 mg by mouth daily., Disp: , Rfl:  .  atorvastatin (LIPITOR) 40 MG tablet, Take 1 tablet (40 mg total) by mouth daily at 6 PM., Disp: 90 tablet, Rfl: 3 .  Blood Glucose Monitoring Suppl (ACCU-CHEK AVIVA PLUS) w/Device KIT, 1 each by Does not apply route 2 (two) times daily. Use as directed, Disp: 100 kit, Rfl: 12 .  glipiZIDE (GLIPIZIDE XL) 5 MG 24 hr tablet, TAKE 1 TABLET BY MOUTH ONCE DAILY WITH BREAKFAST, Disp: 90 tablet, Rfl: 1 .  glucose blood (ACCU-CHEK AVIVA PLUS) test strip, Twice daily check fsbs for DM, Disp: 100 each, Rfl: 12 .  indapamide (LOZOL) 1.25 MG tablet, Take 1 tablet (1.25 mg total) by mouth every other day., Disp: 90 tablet, Rfl: 3 .  latanoprost (XALATAN) 0.005 % ophthalmic solution, Place 1 drop into the left eye at bedtime. , Disp: , Rfl:  .  metFORMIN (GLUCOPHAGE-XR) 500 MG 24 hr tablet, TAKE 4 TABLETS (2,000 MG TOTAL) BY MOUTH DAILY WITH BREAKFAST., Disp: 360 tablet, Rfl: 2 .  niacin 100 MG tablet, Take 100 mg by mouth at bedtime., Disp: , Rfl:  .  Omega-3 Fatty Acids (FISH OIL PO), Take 1 capsule by mouth 2 (two) times daily. , Disp: , Rfl:  .  polyethylene glycol (MIRALAX / GLYCOLAX) packet, Take 17 g by mouth daily as needed., Disp: , Rfl:   Allergies  Allergen Reactions  . Ace Inhibitors Other (See Comments)    angioedema     ROS  Constitutional: Negative for fever or weight change.  Respiratory: Negative for cough , stable  shortness of breath with activity.   Cardiovascular:  Negative for chest pain or palpitations.  Gastrointestinal: Negative for abdominal pain, no bowel changes.  Musculoskeletal: Negative for gait problem or joint swelling.  Skin: Negative for rash.  Neurological: Negative for dizziness or headache.  No other specific complaints in a complete review of systems (except as listed in HPI above).  Objective  Vitals:   02/18/17 1148  BP: 120/70  Pulse: 94  Resp: 14  SpO2: 97%  Weight: 294 lb  9.6 oz (133.6 kg)  Height: _0  (1.753 m)    Body mass index is 43.5 kg/m.  Physical Exam  Constitutional: Patient appears well-developed and well-nourished. Obese No distress.  HEENT: head atraumatic, normocephalic, pupils equal and reactive to light, neck supple, throat within normal limits Cardiovascular: Normal rate, regular rhythm and normal heart sounds.  No murmur heard. No BLE edema. Pulmonary/Chest: Effort normal and breath sounds normal. No respiratory distress. Abdominal: Soft.  There is no tenderness. Psychiatric: Patient has a normal mood and affect. behavior is normal. Judgment and thought content normal. Skin: healing area where he stepped on a nail, below left first, scab is formin, no tenderness or signs of infection noticed  PHQ2/9: Depression screen [facility] 2/9 08/19/2016 07/28/2016 03/28/2016 11/27/2015 10/26/2015  Decreased Interest 0 0 0 0 1  Down, Depressed, Hopeless 0 0 0 0 0  PHQ - 2 Score 0 0 0 0 1     Fall Risk: Fall Risk  02/18/2017 09/08/2016 08/19/2016 07/28/2016 03/28/2016  Falls in the past year? _1   Number falls in past yr: - - - - -  Injury with Fall? - - - - -  Risk for fall due to : - - - - -  Follow up - - - - -     Functional Status Survey: Is the patient deaf or have difficulty hearing?: No Does the patient have difficulty seeing, even when wearing glasses/contacts?: No Does the patient have difficulty concentrating, remembering, or making decisions?: No Does the patient have difficulty walking or  climbing stairs?: No Does the patient have difficulty dressing or bathing?: No Does the patient have difficulty doing errands alone such as visiting a doctor's office or shopping?: No   Assessment & Plan  1. Essential hypertension  - COMPLETE METABOLIC PANEL WITH GFR - amLODipine (NORVASC) 2.5 MG tablet; Take 1 tablet (2.5 mg total) by mouth daily.  Dispense: 90 tablet; Refill: 2 - indapamide (LOZOL) 1.25 MG tablet; Take 1 tablet (1.25 mg total) by mouth every other day.  Dispense: 90 tablet; Refill: 3  2. Mixed hyperlipidemia   3. DM melitus with neuropathy causing ED  - Hemoglobin A1c - ACCU-CHEK FASTCLIX LANCETS MISC; 1 each by Subdermal route 2 (two) times daily.  Dispense: 102 each; Refill: 2 - glipiZIDE (GLIPIZIDE XL) 5 MG 24 hr tablet; TAKE 1 TABLET BY MOUTH ONCE DAILY WITH BREAKFAST  Dispense: 90 tablet; Refill: 1 - glucose blood (ACCU-CHEK AVIVA PLUS) test strip; Twice daily check fsbs for DM  Dispense: 100 each; Refill: 12 - Urine Microalbumin w/creat. ratio  4. Long-term use of high-risk medication  - Vitamin B12  5. Abrasion of left foot, initial encounter  While at the beach, stepped on a long nail  through his shoes, he removed it and has been doing well, however no tdap on file we will give him a booster today   6. Need for Tdap vaccination  - Tdap vaccine greater than or equal to 7yo IM

## 2017-02-19 ENCOUNTER — Ambulatory Visit: Payer: Medicare PPO | Admitting: Podiatry

## 2017-02-19 ENCOUNTER — Encounter: Payer: Self-pay | Admitting: Podiatry

## 2017-02-19 DIAGNOSIS — B351 Tinea unguium: Secondary | ICD-10-CM | POA: Diagnosis not present

## 2017-02-19 DIAGNOSIS — L84 Corns and callosities: Secondary | ICD-10-CM

## 2017-02-19 DIAGNOSIS — M79676 Pain in unspecified toe(s): Secondary | ICD-10-CM

## 2017-02-19 LAB — COMPLETE METABOLIC PANEL WITH GFR
AG Ratio: 1.1 (calc) (ref 1.0–2.5)
ALBUMIN MSPROF: 4 g/dL (ref 3.6–5.1)
ALKALINE PHOSPHATASE (APISO): 67 U/L (ref 40–115)
ALT: 14 U/L (ref 9–46)
AST: 19 U/L (ref 10–35)
BUN: 16 mg/dL (ref 7–25)
CO2: 25 mmol/L (ref 20–32)
CREATININE: 0.86 mg/dL (ref 0.70–1.18)
Calcium: 9.7 mg/dL (ref 8.6–10.3)
Chloride: 100 mmol/L (ref 98–110)
GFR, Est African American: 98 mL/min/{1.73_m2} (ref >=60)
GFR, Est Non African American: 85 mL/min/{1.73_m2} (ref >=60)
GLUCOSE: 141 mg/dL — AB (ref 65–99)
Globulin: 3.7 g/dL (calc) (ref 1.9–3.7)
Potassium: 4.5 mmol/L (ref 3.5–5.3)
Sodium: 134 mmol/L — ABNORMAL LOW (ref 135–146)
TOTAL PROTEIN: 7.7 g/dL (ref 6.1–8.1)
Total Bilirubin: 0.9 mg/dL (ref 0.2–1.2)

## 2017-02-19 LAB — HEMOGLOBIN A1C
EAG (MMOL/L): 8.1 (calc)
HEMOGLOBIN A1C: 6.7 %{Hb} — AB (ref <5.7)
MEAN PLASMA GLUCOSE: 146 (calc)

## 2017-02-19 LAB — VITAMIN B12: Vitamin B-12: 607 pg/mL (ref 200–1100)

## 2017-02-19 LAB — MICROALBUMIN / CREATININE URINE RATIO
Creatinine, Urine: 133 mg/dL (ref 20–320)
MICROALB UR: 1.2 mg/dL
Microalb Creat Ratio: 9 mcg/mg creat (ref <30)

## 2017-02-19 NOTE — Progress Notes (Signed)
Complaint:  Visit Type: Patient returns to my office for continued preventative foot care services. Complaint: Patient states" my nails have grown long and thick and become painful to walk and wear shoes" Patient has been diagnosed with DM with no foot complications. The patient presents for preventative foot care services. No changes to ROS.  Painful corn second toe right foot.  Podiatric Exam: Vascular: dorsalis pedis and posterior tibial pulses are palpable bilateral. Capillary return is immediate. Temperature gradient is WNL. Skin turgor WNL  Sensorium: Normal Semmes Weinstein monofilament test. Normal tactile sensation bilaterally. Nail Exam: Pt has thick disfigured discolored nails with subungual debris noted bilateral entire nail hallux through fifth toenails Ulcer Exam: There is no evidence of ulcer or pre-ulcerative changes or infection. Orthopedic Exam: Muscle tone and strength are WNL. No limitations in general ROM. No crepitus or effusions noted. HAV  B/L. Hammer toes second  B/L Skin: No Porokeratosis. No infection or ulcers.  Corn second toe right foot.  Callus left hallux.  Diagnosis:  Onychomycosis, , Pain in right toe, pain in left toes, Corn/callus  Treatment & Plan Procedures and Treatment: Consent by patient was obtained for treatment procedures. The patient understood the discussion of treatment and procedures well. All questions were answered thoroughly reviewed. Debridement of mycotic and hypertrophic toenails, 1 through 5 bilateral and clearing of subungual debris. No ulceration, no infection noted. Debride corn second toe right.  Padding dispensed. Return Visit-Office Procedure: Patient instructed to return to the office for a follow up visit 3 months for continued evaluation and treatment.    Gardiner Barefoot DPM

## 2017-02-26 ENCOUNTER — Ambulatory Visit: Payer: Medicare PPO | Admitting: Cardiovascular Disease

## 2017-02-26 ENCOUNTER — Encounter: Payer: Self-pay | Admitting: Cardiovascular Disease

## 2017-02-26 VITALS — BP 145/83 | HR 90 | Ht 69.0 in | Wt 300.2 lb

## 2017-02-26 DIAGNOSIS — E782 Mixed hyperlipidemia: Secondary | ICD-10-CM

## 2017-02-26 DIAGNOSIS — I10 Essential (primary) hypertension: Secondary | ICD-10-CM

## 2017-02-26 DIAGNOSIS — I350 Nonrheumatic aortic (valve) stenosis: Secondary | ICD-10-CM | POA: Diagnosis not present

## 2017-02-26 DIAGNOSIS — E1129 Type 2 diabetes mellitus with other diabetic kidney complication: Secondary | ICD-10-CM | POA: Diagnosis not present

## 2017-02-26 DIAGNOSIS — R809 Proteinuria, unspecified: Secondary | ICD-10-CM | POA: Diagnosis not present

## 2017-02-26 NOTE — Patient Instructions (Addendum)
Medication Instructions:   No medication changes made  Labwork:  No new labs needed  Testing/Procedures:  We will order an echocardiogram for aortic valve stenosis Your physician has requested that you have an echocardiogram. Echocardiography is a painless test that uses sound waves to create images of your heart. It provides your doctor with information about the size and shape of your heart and how well your heart's chambers and valves are working. This procedure takes approximately one hour. There are no restrictions for this procedure.    Follow-Up: It was a pleasure seeing you in the office today. Please call us if you have new issues that need to be addressed before your next appt.  [facility] wants you to follow-up in: 12 months with Dr. Fletcher Anon or Rockey Situ.  You will receive a reminder letter in the mail two months in advance. If you don't receive a letter, please call our office to schedule the follow-up appointment.  If you need a refill on your cardiac medications before your next appointment, please call your pharmacy.

## 2017-02-26 NOTE — Progress Notes (Signed)
Cardiology Office Note  Date:  02/26/2017   ID:  Jordan [name], DOB 01-Mar-1941, MRN [id number]  PCP:  Steele Sizer, MD   Chief Complaint  Patient presents with  . other    LS 2014 hx mild aortic stenosis. Requesting echo. Meds reviewed verbally with pt.    HPI:  Jordan [name] is a 76 year old gentleman with past medical history of Diabetes Smoked, age 10, 42 years Morbid obesity ARB/ACE causing angioedema Essential hypertension Hyperlipidemia Aortic valve stenosis, last evaluated 2014 Who presents by referral from Dr. Ancil Boozer for further evaluation of his murmur, aortic valve stenosis  Reports he is active, trying to watch his diet, lose weight Walks for 30 minutes daily in the mall Denies any chest pain or shortness of breath on exertion  Weight actually appears to be trending up over the past several months now 300 pounds, previously 289 pounds September 2018  Previous echocardiogram discussed with him in detail from September 2014 mild aortic valve stenosis at that time  EKG personally reviewed by myself on todays visit Shows normal sinus rhythm rate 90 bpm nonspecific T wave abnormality, no significant ST or T wave changes  Lab work reviewed with him showing hemoglobin A1c down from 9.2, currently 6.7 Creatinine 0.86 total cholesterol 158, LDL 86   PMH:   has a past medical history of Chronic kidney disease, stage II (mild), Decreased libido, Diabetes mellitus without complication ([employer]), Glaucoma, Gout (2009), Heart murmur, Hernia (4098,1191), Hyperlipidemia, Hypertension, Inguinal hernia without mention of obstruction or gangrene, unilateral or unspecified, (not specified as recurrent), Lumbago, Obesity, and Unspecified constipation.  PSH:    Past Surgical History:  Procedure Laterality Date  . cataract surgery    . COLONOSCOPY  2012  . EYE SURGERY  2011   cataract  . UMBILICAL HERNIA REPAIR  2011    Current Outpatient Medications  Medication Sig  Dispense Refill  . ACCU-CHEK FASTCLIX LANCETS MISC 1 each by Subdermal route 2 (two) times daily. 102 each 2  . allopurinol (ZYLOPRIM) 300 MG tablet Take 1 tablet (300 mg total) by mouth daily. 90 tablet 3  . amLODipine (NORVASC) 2.5 MG tablet Take 1 tablet (2.5 mg total) by mouth daily. 90 tablet 2  . aspirin 81 MG tablet Take 162 mg by mouth daily.    Marland Kitchen atorvastatin (LIPITOR) 40 MG tablet Take 1 tablet (40 mg total) by mouth daily at 6 PM. 90 tablet 3  . Blood Glucose Monitoring Suppl (ACCU-CHEK AVIVA PLUS) w/Device KIT 1 each by Does not apply route 2 (two) times daily. Use as directed 100 kit 12  . glipiZIDE (GLIPIZIDE XL) 5 MG 24 hr tablet TAKE 1 TABLET BY MOUTH ONCE DAILY WITH BREAKFAST 90 tablet 1  . glucose blood (ACCU-CHEK AVIVA PLUS) test strip Twice daily check fsbs for DM 100 each 12  . indapamide (LOZOL) 1.25 MG tablet Take 1 tablet (1.25 mg total) by mouth every other day. 90 tablet 3  . latanoprost (XALATAN) 0.005 % ophthalmic solution Place 1 drop into the left eye at bedtime.     . metFORMIN (GLUCOPHAGE-XR) 500 MG 24 hr tablet TAKE 4 TABLETS (2,000 MG TOTAL) BY MOUTH DAILY WITH BREAKFAST. 360 tablet 2  . niacin 100 MG tablet Take 100 mg by mouth at bedtime.    . Omega-3 Fatty Acids (FISH OIL PO) Take 1 capsule by mouth 2 (two) times daily.     . polyethylene glycol (MIRALAX / GLYCOLAX) packet Take 17 g by mouth daily as needed.  No current facility-administered medications for this visit.      Allergies:   Ace inhibitors   Social History:  The patient  reports that he quit smoking about 41 years ago. His smoking use included cigarettes. He started smoking about 51 years ago. He has a 10.00 pack-year smoking history. he has never used smokeless tobacco. He reports that he drinks about 0.6 oz of alcohol per week. He reports that he does not use drugs.   Family History:   family history includes Diabetes in his brother, sister, and sister; Heart disease in his father;  Hypertension in his father; Prostate cancer in his brother.    Review of Systems: Review of Systems  Constitutional: Negative.        Weight gain  Respiratory: Negative.   Cardiovascular: Negative.   Gastrointestinal: Negative.   Musculoskeletal: Negative.   Neurological: Negative.   Psychiatric/Behavioral: Negative.   All other systems reviewed and are negative.    PHYSICAL EXAM: VS:  BP (!) 145/83 (BP Location: Right Arm, Patient Position: Sitting, Cuff Size: Large)   Pulse 90   Ht '5\' 9"'  (1.753 m)   Wt (!) 300 lb 4 oz (136.2 kg)   BMI 44.34 kg/m  , BMI Body mass index is 44.34 kg/m. GEN: Well nourished, well developed, in no acute distress , obese HEENT: normal  Neck: no JVD, carotid bruits, or masses Cardiac: RRR; I systolic ejection murmur heard right sternal borderI/VI  murmurs, rubs, or gallops,no edema  Respiratory:  clear to auscultation bilaterally, normal work of breathing GI: soft, nontender, nondistended, + BS MS: no deformity or atrophy  Skin: warm and dry, no rash Neuro:  Strength and sensation are intact Psych: euthymic mood, full affect    Recent Labs: 07/28/2016: Hemoglobin 14.4; Platelets 198 02/18/2017: ALT 14; BUN 16; Creat 0.86; Potassium 4.5; Sodium 134    Lipid Panel Lab Results  Component Value Date   CHOL 158 07/28/2016   HDL 52 07/28/2016   LDLCALC 86 07/28/2016   TRIG 101 07/28/2016      Wt Readings from Last 3 Encounters:  02/26/17 (!) 300 lb 4 oz (136.2 kg)  02/18/17 294 lb 9.6 oz (133.6 kg)  11/24/16 289 lb 8 oz (131.3 kg)       ASSESSMENT AND PLAN:  Essential hypertension - Plan: EKG 12-Lead Blood pressure borderline elevated, recommended he continue weight loss, monitor blood pressure at home  Aortic valve stenosis, etiology of cardiac valve disease unspecified - Prominent murmur on exam, likely with moderate aortic valve stenosis Asymptomatic, repeat echocardiogram ordered to confirm gradient  Mixed  hyperlipidemia With numbers reasonable May 2018, likely improved with improved diabetes numbers Stressed importance of weight loss  Controlled type 2 diabetes mellitus with microalbuminuria, without long-term current use of insulin (HCC) Hemoglobin A1c less than 7, he has changed his diet dramatically Walking 30 minutes daily  Obesity, Class III, BMI 40-49.9 (morbid obesity) ([place]) We have encouraged continued exercise, careful diet management in an effort to lose weight.   Disposition:   F/U 12 months with Dr. Fletcher Anon   Total encounter time more than 45 minutes  Greater than 50% was spent in counseling and coordination of care with the patient    Orders Placed This Encounter  Procedures  . EKG 12-Lead  . ECHOCARDIOGRAM COMPLETE     Signed, Esmond Plants, M.D., Ph.D. 02/26/2017  [place], [place]

## 2017-03-20 ENCOUNTER — Ambulatory Visit (INDEPENDENT_AMBULATORY_CARE_PROVIDER_SITE_OTHER): Payer: Medicare PPO

## 2017-03-20 ENCOUNTER — Other Ambulatory Visit: Payer: Self-pay

## 2017-03-20 DIAGNOSIS — I350 Nonrheumatic aortic (valve) stenosis: Secondary | ICD-10-CM

## 2017-03-23 ENCOUNTER — Ambulatory Visit: Payer: Medicare PPO | Admitting: Family Medicine

## 2017-03-23 ENCOUNTER — Ambulatory Visit: Payer: Medicare PPO | Admitting: Cardiovascular Disease

## 2017-03-26 ENCOUNTER — Ambulatory Visit: Payer: Medicare PPO | Admitting: Cardiovascular Disease

## 2017-05-21 ENCOUNTER — Other Ambulatory Visit: Payer: Self-pay | Admitting: Family Medicine

## 2017-05-21 ENCOUNTER — Ambulatory Visit: Payer: Medicare PPO | Admitting: Podiatry

## 2017-05-21 ENCOUNTER — Encounter: Payer: Self-pay | Admitting: Podiatry

## 2017-05-21 DIAGNOSIS — L84 Corns and callosities: Secondary | ICD-10-CM | POA: Diagnosis not present

## 2017-05-21 DIAGNOSIS — E1129 Type 2 diabetes mellitus with other diabetic kidney complication: Secondary | ICD-10-CM

## 2017-05-21 DIAGNOSIS — M79676 Pain in unspecified toe(s): Secondary | ICD-10-CM

## 2017-05-21 DIAGNOSIS — IMO0002 Reserved for concepts with insufficient information to code with codable children: Secondary | ICD-10-CM

## 2017-05-21 DIAGNOSIS — N521 Erectile dysfunction due to diseases classified elsewhere: Secondary | ICD-10-CM

## 2017-05-21 DIAGNOSIS — E1140 Type 2 diabetes mellitus with diabetic neuropathy, unspecified: Secondary | ICD-10-CM

## 2017-05-21 DIAGNOSIS — R809 Proteinuria, unspecified: Principal | ICD-10-CM

## 2017-05-21 DIAGNOSIS — B351 Tinea unguium: Secondary | ICD-10-CM

## 2017-05-21 DIAGNOSIS — E1165 Type 2 diabetes mellitus with hyperglycemia: Principal | ICD-10-CM

## 2017-05-21 DIAGNOSIS — E1142 Type 2 diabetes mellitus with diabetic polyneuropathy: Secondary | ICD-10-CM

## 2017-05-21 NOTE — Progress Notes (Signed)
Complaint:  Visit Type: Patient returns to my office for continued preventative foot care services. Complaint: Patient states" my nails have grown long and thick and become painful to walk and wear shoes" Patient has been diagnosed with DM with no foot complications. The patient presents for preventative foot care services. No changes to ROS.  Painful corn second toe right foot.  Podiatric Exam: Vascular: dorsalis pedis and posterior tibial pulses are palpable bilateral. Capillary return is immediate. Temperature gradient is WNL. Skin turgor WNL  Sensorium: Normal Semmes Weinstein monofilament test. Normal tactile sensation bilaterally. Nail Exam: Pt has thick disfigured discolored nails with subungual debris noted bilateral entire nail hallux through fifth toenails Ulcer Exam: There is no evidence of ulcer or pre-ulcerative changes or infection. Orthopedic Exam: Muscle tone and strength are WNL. No limitations in general ROM. No crepitus or effusions noted. HAV  B/L. Hammer toes second  B/L Skin: No Porokeratosis. No infection or ulcers.  Corn second toe right foot.    Diagnosis:  Onychomycosis, , Pain in right toe, pain in left toes, Corn/callus  Treatment & Plan Procedures and Treatment: Consent by patient was obtained for treatment procedures. The patient understood the discussion of treatment and procedures well. All questions were answered thoroughly reviewed. Debridement of mycotic and hypertrophic toenails, 1 through 5 bilateral and clearing of subungual debris. No ulceration, no infection noted. Debride corn second toe right.  Padding dispensed. Return Visit-Office Procedure: Patient instructed to return to the office for a follow up visit 3 months for continued evaluation and treatment.    Gardiner Barefoot DPM

## 2017-06-19 ENCOUNTER — Ambulatory Visit: Payer: Medicare PPO | Admitting: Family Medicine

## 2017-06-19 ENCOUNTER — Encounter: Payer: Self-pay | Admitting: Family Medicine

## 2017-06-19 VITALS — BP 150/90 | HR 84 | Resp 16 | Ht 69.0 in | Wt 287.9 lb

## 2017-06-19 DIAGNOSIS — R809 Proteinuria, unspecified: Secondary | ICD-10-CM

## 2017-06-19 DIAGNOSIS — N521 Erectile dysfunction due to diseases classified elsewhere: Secondary | ICD-10-CM | POA: Diagnosis not present

## 2017-06-19 DIAGNOSIS — E1129 Type 2 diabetes mellitus with other diabetic kidney complication: Secondary | ICD-10-CM | POA: Diagnosis not present

## 2017-06-19 DIAGNOSIS — M109 Gout, unspecified: Secondary | ICD-10-CM | POA: Diagnosis not present

## 2017-06-19 DIAGNOSIS — L309 Dermatitis, unspecified: Secondary | ICD-10-CM | POA: Diagnosis not present

## 2017-06-19 DIAGNOSIS — E1140 Type 2 diabetes mellitus with diabetic neuropathy, unspecified: Secondary | ICD-10-CM

## 2017-06-19 DIAGNOSIS — E66813 Obesity, class 3: Secondary | ICD-10-CM

## 2017-06-19 DIAGNOSIS — E782 Mixed hyperlipidemia: Secondary | ICD-10-CM | POA: Diagnosis not present

## 2017-06-19 LAB — POCT GLYCOSYLATED HEMOGLOBIN (HGB A1C): HEMOGLOBIN A1C: 6.8

## 2017-06-19 MED ORDER — AMMONIUM LACTATE 12 % EX CREA: TOPICAL_CREAM | CUTANEOUS | 1 refills | Status: DC | PRN

## 2017-06-19 MED ORDER — ALLOPURINOL 300 MG PO TABS: 300.0000 mg | ORAL_TABLET | Freq: Every day | ORAL | 3 refills | Status: DC

## 2017-06-19 MED ORDER — SILDENAFIL CITRATE 100 MG PO TABS: 50.0000 mg | ORAL_TABLET | Freq: Every day | ORAL | 2 refills | Status: DC | PRN

## 2017-06-19 MED ORDER — ATORVASTATIN CALCIUM 40 MG PO TABS: 40.0000 mg | ORAL_TABLET | Freq: Every day | ORAL | 3 refills | Status: DC

## 2017-06-19 MED ORDER — METFORMIN HCL ER 500 MG PO TB24: 2000.0000 mg | ORAL_TABLET | Freq: Every day | ORAL | 2 refills | Status: DC

## 2017-06-19 NOTE — Progress Notes (Signed)
Name: Jordan [name]   MRN: [id number]    DOB: 1940-07-13   Date:06/19/2017       Progress Note  Subjective  Chief Complaint  Chief Complaint  Patient presents with  . Diabetes  . Hypertension  . Medication Refill    4 month F/U  . Hyperlipidemia    HPI  DM with microalbuminuria and ED: he is back on Glipizide and Metformin, since other medications were too expensive and hgbA1C was up to 9.2%, 9% 9.2% , 7.4%, 6.7% and today 6.8%   Heis off sweet tea and no longer snacking on grapes throughout the nigh, checking glucose at home and is around 140's  fasting, occasionally spikes to 200 when he splurges. He has lost 13 lbs since Dec 2019. Still eats cookies but overall more careful with his diet. He has microalbuminuria and also ED. He would like to go back on Viagra. He cannot take ACE because of history of angioedema  BPH: seen by [facility] last year because of testicular mass, diagnosed with hydrocele and small fat containing left inguinal hernia.  Unchanged, no problems.   HTN: well controlled with medication, it was high when he arrived, but back to normal after rest , no chest pain, SOBor palpitation. He denies orthostatic changes.Denies side effects of medications  Hyperlipidemia: taking lipitor and denies side effects, no myalgia.Reviewed labs labs with patient   Obesity: hehas gained weight since last visit, likely from going back on Glipizide, discussed life style modification, he is walking 5 times a week for at least 30-35  minutes, avoiding sweet tea and has lost 13 lbs since Dec 2018  Aortic Valve stenosis: seen by Dr. Rockey Situ , January 2019 , no syncope or chest pain, had echo normal EF, mild left ventricular dilation    Patient Active Problem List   Diagnosis Date Noted  . Bilateral hydrocele 11/27/2015  . Mass, scrotum 11/27/2015  . Ventral hernia without obstruction or gangrene 11/27/2015  . Controlled type 2 diabetes mellitus with  microalbuminuria ([place]) 10/26/2015  . 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 Hyperlipidemia 11/07/2014  . Essential hypertension 11/07/2014  . Obesity, Class III, BMI 40-49.9 (morbid obesity) ([place]) 11/07/2014  . Gout 11/07/2014  . 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  . Epistaxis 09/21/2014  . 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 Aortic valve stenosis 12/02/2012  . Constipation 10/26/2012  . Incisional hernia, without obstruction or gangrene 10/26/2012  . Left inguinal hernia     Past Surgical History:  Procedure Laterality Date  . cataract surgery    . COLONOSCOPY  2012  . EYE SURGERY  2011   cataract  . UMBILICAL HERNIA REPAIR  2011    Family History  Problem Relation Age of Onset  . Heart disease Father   . Hypertension Father   . Diabetes Sister   . Diabetes Brother   . Prostate cancer Brother   . Diabetes Sister   . Kidney disease Neg Hx   . Kidney cancer Neg Hx   . Bladder Cancer Neg Hx     Social History   Socioeconomic History  . Marital status: Widowed    Spouse name: Not on file  . Number of children: Not on file  . Years of education: Not on file  . Highest education level: Not on file  Occupational History  . Not on file  Social Needs  . Financial resource strain: Not on file  . Food insecurity:    Worry:  Not on file    Inability: Not on file  . Transportation needs:    Medical: Not on file    Non-medical: Not on file  Tobacco Use  . Smoking status: Former Smoker    Packs/day: 1.00    Years: 10.00    Pack years: 10.00    Types: Cigarettes    Start date: 03/17/1965    Last attempt to quit: 11/27/1975    Years since quitting: 41.5  . Smokeless tobacco: Never Used  . Tobacco comment: quit 40 years  Substance and Sexual Activity  . Alcohol use: Yes    Alcohol/week: 0.6 oz    Types: 1 Standard drinks or equivalent per week    Comment: socially - 1 x year  . Drug use: No  . Sexual  activity: Yes    Partners: Female  Lifestyle  . Physical activity:    Days per week: Not on file    Minutes per session: Not on file  . Stress: Not on file  Relationships  . Social connections:    Talks on phone: Not on file    Gets together: Not on file    Attends religious service: Not on file    Active member of club or organization: Not on file    Attends meetings of clubs or organizations: Not on file    Relationship status: Not on file  . Intimate partner violence:    Fear of current or ex partner: Not on file    Emotionally abused: Not on file    Physically abused: Not on file    Forced sexual activity: Not on file  Other Topics Concern  . Not on file  Social History Narrative  . Not on file     Current Outpatient Medications:  .  ACCU-CHEK FASTCLIX LANCETS MISC, 1 each by Subdermal route 2 (two) times daily., Disp: 102 each, Rfl: 2 .  allopurinol (ZYLOPRIM) 300 MG tablet, Take 1 tablet (300 mg total) by mouth daily., Disp: 90 tablet, Rfl: 3 .  amLODipine (NORVASC) 2.5 MG tablet, Take 1 tablet (2.5 mg total) by mouth daily., Disp: 90 tablet, Rfl: 2 .  aspirin 81 MG tablet, Take 162 mg by mouth daily., Disp: , Rfl:  .  atorvastatin (LIPITOR) 40 MG tablet, Take 1 tablet (40 mg total) by mouth daily at 6 PM., Disp: 90 tablet, Rfl: 3 .  Blood Glucose Monitoring Suppl (ACCU-CHEK AVIVA PLUS) w/Device KIT, 1 each by Does not apply route 2 (two) times daily. Use as directed, Disp: 100 kit, Rfl: 12 .  glipiZIDE (GLIPIZIDE XL) 5 MG 24 hr tablet, TAKE 1 TABLET BY MOUTH ONCE DAILY WITH BREAKFAST, Disp: 90 tablet, Rfl: 1 .  glucose blood (ACCU-CHEK AVIVA PLUS) test strip, Twice daily check fsbs for DM, Disp: 100 each, Rfl: 12 .  indapamide (LOZOL) 1.25 MG tablet, Take 1 tablet (1.25 mg total) by mouth every other day., Disp: 90 tablet, Rfl: 3 .  latanoprost (XALATAN) 0.005 % ophthalmic solution, Place 1 drop into the left eye at bedtime. , Disp: , Rfl:  .  metFORMIN (GLUCOPHAGE-XR)  500 MG 24 hr tablet, TAKE 4 TABLETS (2,000 MG TOTAL) BY MOUTH DAILY WITH BREAKFAST., Disp: 360 tablet, Rfl: 2 .  niacin 100 MG tablet, Take 100 mg by mouth at bedtime., Disp: , Rfl:  .  Omega-3 Fatty Acids (FISH OIL PO), Take 1 capsule by mouth 2 (two) times daily. , Disp: , Rfl:  .  polyethylene glycol (MIRALAX / GLYCOLAX) packet,  Take 17 g by mouth daily as needed., Disp: , Rfl:   Allergies  Allergen Reactions  . Ace Inhibitors Other (See Comments)    angioedema     ROS  Constitutional: Negative for fever , positive for  weight change.  Respiratory: Negative for cough and shortness of breath.   Cardiovascular: Negative for chest pain or palpitations.  Gastrointestinal: Negative for abdominal pain, no bowel changes.  Musculoskeletal: Negative for gait problem or joint swelling.  Skin: he has dry skin and has to use lots of lotion daily .   Neurological: Negative for dizziness or headache.  No other specific complaints in a complete review of systems (except as listed in HPI above).  Objective  Vitals:   06/19/17 1156 06/19/17 1159  BP: 120/72 (!) 150/90  Pulse:  84  Resp:  16  SpO2:  98%  Weight:  287 lb 14.4 oz (130.6 kg)  Height:  '5\' 9"'  (1.753 m)    Body mass index is 42.52 kg/m.  Physical Exam  Constitutional: Patient appears well-developed and well-nourished. Obese  No distress.  HEENT: head atraumatic, normocephalic, pupils equal and reactive to light, neck supple, throat within normal limits Cardiovascular: Normal rate, regular rhythm and normal heart sounds.  No murmur heard. No BLE edema. Pulmonary/Chest: Effort normal and breath sounds normal. No respiratory distress. Abdominal: Soft.  There is no tenderness. Psychiatric: Patient has a normal mood and affect. behavior is normal. Judgment and thought content normal.  Recent Results (from the past 2160 hour(s))  POCT HgB A1C     Status: Abnormal   Collection Time: 06/19/17 12:27 PM  Result Value Ref Range    Hemoglobin A1C 6.8      PHQ2/9: Depression screen [facility] 2/9 08/19/2016 07/28/2016 03/28/2016 11/27/2015 10/26/2015  Decreased Interest 0 0 0 0 1  Down, Depressed, Hopeless 0 0 0 0 0  PHQ - 2 Score 0 0 0 0 1     Fall Risk: Fall Risk  06/19/2017 02/18/2017 09/08/2016 08/19/2016 07/28/2016  Falls in the past year? No No No No No  Number falls in past yr: - - - - -  Injury with Fall? - - - - -  Risk for fall due to : - - - - -  Follow up - - - - -    Functional Status Survey: Is the patient deaf or have difficulty hearing?: No Does the patient have difficulty seeing, even when wearing glasses/contacts?: No Does the patient have difficulty concentrating, remembering, or making decisions?: No Does the patient have difficulty walking or climbing stairs?: No Does the patient have difficulty dressing or bathing?: No Does the patient have difficulty doing errands alone such as visiting a doctor's office or shopping?: No    Assessment & Plan  1. 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  Dispense: 360 tablet; Refill: 2 - sildenafil (VIAGRA) 100 MG tablet; Take 0.5-1 tablets (50-100 mg total) by mouth daily as needed for erectile dysfunction.  Dispense: 15 tablet; Refill: 2  2. Controlled gout  - allopurinol (ZYLOPRIM) 300 MG tablet; Take 1 tablet (300 mg total) by mouth daily.  Dispense: 90 tablet; Refill: 3  3. Mixed hyperlipidemia  - atorvastatin (LIPITOR) 40 MG tablet; Take 1 tablet (40 mg total) by mouth daily at 6 PM.  Dispense: 90 tablet; Refill: 3  4. Obesity, Class III,  BMI 40-49.9 (morbid obesity) ([place])  Discussed with the patient the risk posed by an increased BMI. Discussed importance of portion control, calorie counting and at least 150 minutes of physical activity weekly. Avoid sweet beverages and drink more water. Eat at least  6 servings of fruit and vegetables daily  Doing well, went to [facility], avoiding sweet tea and has been losing weight.   5. Controlled type 2 diabetes mellitus with microalbuminuria, without long-term current use of insulin (HCC)  Not on ACE because of angioedema  6. Eczema, unspecified type  - ammonium lactate (AMLACTIN) 12 % cream; Apply topically as needed for dry skin.  Dispense: 385 g; Refill: 1

## 2017-08-17 DIAGNOSIS — H401122 Primary open-angle glaucoma, left eye, moderate stage: Secondary | ICD-10-CM | POA: Diagnosis not present

## 2017-08-21 DIAGNOSIS — H401122 Primary open-angle glaucoma, left eye, moderate stage: Secondary | ICD-10-CM | POA: Diagnosis not present

## 2017-08-24 ENCOUNTER — Ambulatory Visit: Payer: Medicare PPO | Admitting: Podiatry

## 2017-08-24 ENCOUNTER — Encounter: Payer: Self-pay | Admitting: Podiatry

## 2017-08-24 DIAGNOSIS — M79676 Pain in unspecified toe(s): Secondary | ICD-10-CM

## 2017-08-24 DIAGNOSIS — M2042 Other hammer toe(s) (acquired), left foot: Secondary | ICD-10-CM

## 2017-08-24 DIAGNOSIS — M2041 Other hammer toe(s) (acquired), right foot: Secondary | ICD-10-CM

## 2017-08-24 DIAGNOSIS — B351 Tinea unguium: Secondary | ICD-10-CM

## 2017-08-24 DIAGNOSIS — E1142 Type 2 diabetes mellitus with diabetic polyneuropathy: Secondary | ICD-10-CM

## 2017-08-24 NOTE — Progress Notes (Signed)
Complaint:  Visit Type: Patient returns to my office for continued preventative foot care services. Complaint: Patient states" my nails have grown long and thick and become painful to walk and wear shoes" Patient has been diagnosed with DM with no foot complications. The patient presents for preventative foot care services. No changes to ROS.  Painful corn second toe right foot.  Podiatric Exam: Vascular: dorsalis pedis and posterior tibial pulses are palpable bilateral. Capillary return is immediate. Temperature gradient is WNL. Skin turgor WNL  Sensorium: Normal Semmes Weinstein monofilament test. Normal tactile sensation bilaterally. Nail Exam: Pt has thick disfigured discolored nails with subungual debris noted bilateral entire nail hallux through fifth toenails Ulcer Exam: There is no evidence of ulcer or pre-ulcerative changes or infection. Orthopedic Exam: Muscle tone and strength are WNL. No limitations in general ROM. No crepitus or effusions noted. HAV  B/L. Hammer toes second  B/L Skin: No Porokeratosis. No infection or ulcers.  Corn second toe B/L.    Diagnosis:  Onychomycosis, , Pain in right toe, pain in left toes, Corn/callus  Treatment & Plan Procedures and Treatment: Consent by patient was obtained for treatment procedures. The patient understood the discussion of treatment and procedures well. All questions were answered thoroughly reviewed. Debridement of mycotic and hypertrophic toenails, 1 through 5 bilateral and clearing of subungual debris. No ulceration, no infection noted.   Padding dispensed. Return Visit-Office Procedure: Patient instructed to return to the office for a follow up visit 3 months for continued evaluation and treatment.    Romero Letizia DPM 

## 2017-10-20 ENCOUNTER — Ambulatory Visit: Payer: Medicare PPO | Admitting: Family Medicine

## 2017-11-13 ENCOUNTER — Other Ambulatory Visit: Payer: Self-pay | Admitting: Family Medicine

## 2017-11-13 DIAGNOSIS — E1140 Type 2 diabetes mellitus with diabetic neuropathy, unspecified: Secondary | ICD-10-CM

## 2017-11-13 DIAGNOSIS — I10 Essential (primary) hypertension: Secondary | ICD-10-CM

## 2017-11-13 DIAGNOSIS — N521 Erectile dysfunction due to diseases classified elsewhere: Principal | ICD-10-CM

## 2017-11-23 ENCOUNTER — Encounter: Payer: Self-pay | Admitting: Podiatry

## 2017-11-23 ENCOUNTER — Ambulatory Visit: Payer: Medicare PPO | Admitting: Podiatry

## 2017-11-23 DIAGNOSIS — M2041 Other hammer toe(s) (acquired), right foot: Secondary | ICD-10-CM | POA: Diagnosis not present

## 2017-11-23 DIAGNOSIS — B351 Tinea unguium: Secondary | ICD-10-CM | POA: Diagnosis not present

## 2017-11-23 DIAGNOSIS — E1142 Type 2 diabetes mellitus with diabetic polyneuropathy: Secondary | ICD-10-CM | POA: Diagnosis not present

## 2017-11-23 DIAGNOSIS — M2042 Other hammer toe(s) (acquired), left foot: Secondary | ICD-10-CM

## 2017-11-23 DIAGNOSIS — L84 Corns and callosities: Secondary | ICD-10-CM | POA: Diagnosis not present

## 2017-11-23 DIAGNOSIS — M79676 Pain in unspecified toe(s): Secondary | ICD-10-CM | POA: Diagnosis not present

## 2017-11-23 NOTE — Progress Notes (Signed)
Complaint:  Visit Type: Patient returns to my office for continued preventative foot care services. Complaint: Patient states" my nails have grown long and thick and become painful to walk and wear shoes" Patient has been diagnosed with DM with no foot complications. The patient presents for preventative foot care services. No changes to ROS.  Painful corn second toe right foot.  Podiatric Exam: Vascular: dorsalis pedis and posterior tibial pulses are palpable bilateral. Capillary return is immediate. Temperature gradient is WNL. Skin turgor WNL  Sensorium: Normal Semmes Weinstein monofilament test. Normal tactile sensation bilaterally. Nail Exam: Pt has thick disfigured discolored nails with subungual debris noted bilateral entire nail hallux through fifth toenails Ulcer Exam: There is no evidence of ulcer or pre-ulcerative changes or infection. Orthopedic Exam: Muscle tone and strength are WNL. No limitations in general ROM. No crepitus or effusions noted. HAV  B/L. Hammer toes second  B/L Skin: No Porokeratosis. No infection or ulcers.  Corn second toe B/L.    Diagnosis:  Onychomycosis, , Pain in right toe, pain in left toes, Corn/callus  Treatment & Plan Procedures and Treatment: Consent by patient was obtained for treatment procedures. The patient understood the discussion of treatment and procedures well. All questions were answered thoroughly reviewed. Debridement of mycotic and hypertrophic toenails, 1 through 5 bilateral and clearing of subungual debris. No ulceration, no infection noted.   Padding dispensed. Return Visit-Office Procedure: Patient instructed to return to the office for a follow up visit 3 months for continued evaluation and treatment.    Gardiner Barefoot DPM

## 2017-11-24 ENCOUNTER — Ambulatory Visit: Payer: Medicare PPO | Admitting: Urology

## 2017-11-24 ENCOUNTER — Encounter: Payer: Self-pay | Admitting: Urology

## 2017-11-24 VITALS — BP 144/72 | HR 91 | Ht 69.0 in | Wt 288.0 lb

## 2017-11-24 DIAGNOSIS — N138 Other obstructive and reflux uropathy: Secondary | ICD-10-CM | POA: Diagnosis not present

## 2017-11-24 DIAGNOSIS — N401 Enlarged prostate with lower urinary tract symptoms: Secondary | ICD-10-CM

## 2017-11-24 DIAGNOSIS — R351 Nocturia: Secondary | ICD-10-CM | POA: Diagnosis not present

## 2017-11-24 DIAGNOSIS — N471 Phimosis: Secondary | ICD-10-CM | POA: Diagnosis not present

## 2017-11-24 NOTE — Progress Notes (Signed)
11/24/2017 3:18 PM   Jordan [name] May 29, 1940 [id number]  Referring provider: Steele Sizer, MD [address] [place], [place] [address]  Chief Complaint  Patient presents with  . phimosis    HPI: Patient is a 77 year old African American male with a history of phimosis who presents today for follow-up.     Phimosis Patient still has difficulty retracting the foreskin.  He is still able to urinate without issue.  He is not interested in a dorsal slit or circumcision at this time.     BPH WITH LUTS  (prostate and/or bladder) His IPSS score today is 7, which is moderate lower urinary tract symptomatology. He is with his quality life due to his urinary symptoms.  His previous IPSS score was 12/3.  His previous PVR is 29 mL.  He has nocturia x2.  He states he was tested for sleep apnea, but he was found not to have it.  Patient denies any gross hematuria, dysuria or suprapubic/flank pain.  Patient denies any fevers, chills, nausea or vomiting.    His brother has a history of non fatal prostate cancer.  IPSS    Row Name 11/24/17 1400         International Prostate Symptom Score   How often have you had the sensation of not emptying your bladder?  Less than 1 in 5     How often have you had to urinate less than every two hours?  Less than half the time     How often have you found you stopped and started again several times when you urinated?  Not at All     How often have you found it difficult to postpone urination?  Not at All     How often have you had a weak urinary stream?  Less than 1 in 5 times     How often have you had to strain to start urination?  Not at All     How many times did you typically get up at night to urinate?  3 Times     Total IPSS Score  7       Quality of Life due to urinary symptoms   If you were to spend the rest of your life with your urinary condition just the way it is now how would you feel about that?  Mixed         Score:  1-7 Mild 8-19 Moderate 20-35 Severe     PMH: Past Medical History:  Diagnosis Date  . Chronic kidney disease, stage II (mild)   . Decreased libido   . Diabetes mellitus without complication ([place])   . Glaucoma   . Gout 2009  . Heart murmur   . Hernia 7841,2820  . Hyperlipidemia   . Hypertension   . Inguinal hernia without mention of obstruction or gangrene, unilateral or unspecified, (not specified as recurrent)   . Lumbago   . Obesity   . Unspecified constipation     Surgical History: Past Surgical History:  Procedure Laterality Date  . cataract surgery    . COLONOSCOPY  2012  . EYE SURGERY  2011   cataract  . UMBILICAL HERNIA REPAIR  2011    Home Medications:  Allergies as of 11/24/2017      Reactions   Ace Inhibitors Other (See Comments)   angioedema      Medication List        Accurate as of 11/24/17  3:18  PM. Always use your most recent med list.          ACCU-CHEK AVIVA PLUS w/Device Kit 1 each by Does not apply route 2 (two) times daily. Use as directed   ACCU-CHEK FASTCLIX LANCETS Misc 1 each by Subdermal route 2 (two) times daily.   allopurinol 300 MG tablet Commonly known as:  ZYLOPRIM Take 1 tablet (300 mg total) by mouth daily.   amLODipine 2.5 MG tablet Commonly known as:  NORVASC TAKE 1 TABLET (2.5 MG TOTAL) BY MOUTH DAILY.   ammonium lactate 12 % cream Commonly known as:  AMLACTIN Apply topically as needed for dry skin.   aspirin 81 MG tablet Take 162 mg by mouth daily.   atorvastatin 40 MG tablet Commonly known as:  LIPITOR Take 1 tablet (40 mg total) by mouth daily at 6 PM.   FISH OIL PO Take 1 capsule by mouth 2 (two) times daily.   glipiZIDE 5 MG 24 hr tablet Commonly known as:  GLUCOTROL XL TAKE 1 TABLET BY MOUTH ONCE DAILY WITH BREAKFAST   glucose blood test strip Twice daily check fsbs for DM   indapamide 1.25 MG tablet Commonly known as:  LOZOL Take 1 tablet (1.25 mg total) by mouth every other  day.   latanoprost 0.005 % ophthalmic solution Commonly known as:  XALATAN Place 1 drop into the left eye at bedtime.   metFORMIN 500 MG 24 hr tablet Commonly known as:  GLUCOPHAGE-XR Take 4 tablets (2,000 mg total) by mouth daily with breakfast.   niacin 100 MG tablet Take 100 mg by mouth at bedtime.   polyethylene glycol packet Commonly known as:  MIRALAX / GLYCOLAX Take 17 g by mouth daily as needed.   sildenafil 100 MG tablet Commonly known as:  VIAGRA Take 0.5-1 tablets (50-100 mg total) by mouth daily as needed for erectile dysfunction.       Allergies:  Allergies  Allergen Reactions  . Ace Inhibitors Other (See Comments)    angioedema    Family History: Family History  Problem Relation Age of Onset  . Heart disease Father   . Hypertension Father   . Diabetes Sister   . Diabetes Brother   . Prostate cancer Brother   . Diabetes Sister   . Kidney disease Neg Hx   . Kidney cancer Neg Hx   . Bladder Cancer Neg Hx     Social History:  reports that he quit smoking about 42 years ago. His smoking use included cigarettes. He started smoking about 52 years ago. He has a 10.00 pack-year smoking history. He has never used smokeless tobacco. He reports that he drinks about 1.0 standard drinks of alcohol per week. He reports that he does not use drugs.  ROS: UROLOGY Frequent Urination?: No Hard to postpone urination?: No Burning/pain with urination?: No Get up at night to urinate?: Yes Leakage of urine?: No Urine stream starts and stops?: No Trouble starting stream?: No Do you have to strain to urinate?: No Blood in urine?: No Urinary tract infection?: No Sexually transmitted disease?: No Injury to kidneys or bladder?: No Painful intercourse?: No Weak stream?: No Erection problems?: No Penile pain?: No  Gastrointestinal Nausea?: No Vomiting?: No Indigestion/heartburn?: No Diarrhea?: No Constipation?: No  Constitutional Fever: No Night sweats?:  No Weight loss?: No Fatigue?: No  Skin Skin rash/lesions?: No Itching?: No  Eyes Blurred vision?: No Double vision?: No  Ears/Nose/Throat Sore throat?: No Sinus problems?: No  Hematologic/Lymphatic Swollen glands?: No Easy bruising?: No  Cardiovascular Leg swelling?: No Chest pain?: No  Respiratory Cough?: No Shortness of breath?: No  Endocrine Excessive thirst?: No  Musculoskeletal Back pain?: Yes Joint pain?: No  Neurological Headaches?: No Dizziness?: No  Psychologic Depression?: No Anxiety?: No  Physical Exam: BP (!) 144/72 (BP Location: Left Arm, Patient Position: Sitting, Cuff Size: Large)   Pulse 91   Ht '5\' 9"'  (1.753 m)   Wt 288 lb (130.6 kg)   BMI 42.53 kg/m   Constitutional: Well nourished. Alert and oriented, No acute distress. HEENT: [place] AT, moist mucus membranes. Trachea midline, no masses. Cardiovascular: No clubbing, cyanosis, or edema. Respiratory: Normal respiratory effort, no increased work of breathing. GI: Abdomen is soft, non tender, non distended, no abdominal masses. Liver and spleen not palpable.  No hernias appreciated.  Stool sample for occult testing is not indicated.   GU: No CVA tenderness.  No bladder fullness or masses.  Patient with uncircumcised phallus. Foreskin not easily retracted  5 mm aperture.  Urethral meatus is patent.  No penile discharge. No penile lesions or rashes. Scrotum without lesions, cysts, rashes and/or edema.  Testicles are located scrotally bilaterally. No masses are appreciated in the testicles. Left and right epididymis are normal. Rectal: Patient with  normal sphincter tone. Anus and perineum without scarring or rashes. No rectal masses are appreciated. Prostate is approximately 60 grams, could only palpate the apex, no nodules are appreciated.   Skin: No rashes, bruises or suspicious lesions. Lymph: No cervical or inguinal adenopathy. Neurologic: Grossly intact, no focal deficits, moving all 4  extremities. Psychiatric: Normal mood and affect.  Laboratory Data: Lab Results  Component Value Date   WBC 4.9 07/28/2016   HGB 14.4 07/28/2016   HCT 44.7 07/28/2016   MCV 88.7 07/28/2016   PLT 198 07/28/2016    Lab Results  Component Value Date   CREATININE 0.86 02/18/2017    Lab Results  Component Value Date   HGBA1C 6.8 06/19/2017        Component Value Date/Time   CHOL 158 07/28/2016 1248   CHOL 131 08/27/2015 1158   HDL 52 07/28/2016 1248   HDL 40 08/27/2015 1158   CHOLHDL 3.0 07/28/2016 1248   VLDL 20 07/28/2016 1248   LDLCALC 86 07/28/2016 1248   LDLCALC 75 08/27/2015 1158    Lab Results  Component Value Date   AST 19 02/18/2017   Lab Results  Component Value Date   ALT 14 02/18/2017    I have reviewed the labs  Assessment & Plan:    1. Phimosis Not bothersome at this point RTC in one year for recheck Contact office if unable to urinate  2. BPH with LUTS IPSS score is 7/3, it is improving Continue conservative management, avoiding bladder irritants and timed voiding's Most bothersome symptoms is/are nocturia RTC in 12 months for IPSS and exam   3. Nocturia Not bothersome to patient  Return in about 1 year (around 11/25/2018) for  I PSS and exam .  These notes generated with voice recognition software. I apologize for typographical errors.  Zara Council, PA-C  [facility] [address] [place], [place] [address] [phone]

## 2017-11-25 ENCOUNTER — Encounter: Payer: Self-pay | Admitting: Family Medicine

## 2017-11-25 ENCOUNTER — Ambulatory Visit: Payer: Medicare PPO | Admitting: Family Medicine

## 2017-11-25 VITALS — BP 116/72 | HR 88 | Temp 98.0°F | Resp 16 | Ht 69.0 in | Wt 288.5 lb

## 2017-11-25 DIAGNOSIS — N521 Erectile dysfunction due to diseases classified elsewhere: Secondary | ICD-10-CM

## 2017-11-25 DIAGNOSIS — Z79899 Other long term (current) drug therapy: Secondary | ICD-10-CM | POA: Diagnosis not present

## 2017-11-25 DIAGNOSIS — M109 Gout, unspecified: Secondary | ICD-10-CM | POA: Diagnosis not present

## 2017-11-25 DIAGNOSIS — E1140 Type 2 diabetes mellitus with diabetic neuropathy, unspecified: Secondary | ICD-10-CM

## 2017-11-25 DIAGNOSIS — R809 Proteinuria, unspecified: Secondary | ICD-10-CM

## 2017-11-25 DIAGNOSIS — E785 Hyperlipidemia, unspecified: Secondary | ICD-10-CM | POA: Diagnosis not present

## 2017-11-25 DIAGNOSIS — E1129 Type 2 diabetes mellitus with other diabetic kidney complication: Secondary | ICD-10-CM

## 2017-11-25 DIAGNOSIS — I10 Essential (primary) hypertension: Secondary | ICD-10-CM | POA: Diagnosis not present

## 2017-11-25 DIAGNOSIS — Z23 Encounter for immunization: Secondary | ICD-10-CM

## 2017-11-25 LAB — POCT GLYCOSYLATED HEMOGLOBIN (HGB A1C): HBA1C, POC (CONTROLLED DIABETIC RANGE): 6.8 % (ref 0.0–7.0)

## 2017-11-25 MED ORDER — GLIPIZIDE ER 5 MG PO TB24: 5.0000 mg | ORAL_TABLET | Freq: Every day | ORAL | 1 refills | Status: DC

## 2017-11-25 MED ORDER — AMLODIPINE BESYLATE 2.5 MG PO TABS: 2.5000 mg | ORAL_TABLET | Freq: Every day | ORAL | 1 refills | Status: DC

## 2017-11-25 NOTE — Patient Instructions (Signed)
Stop Inapamide 1.25 mg and return in the next 2 weeks for bp check only with CMA

## 2017-11-25 NOTE — Progress Notes (Signed)
Name: Jordan [name]   MRN: [id number]    DOB: [birth year]   Date:11/25/2017       Progress Note  Subjective  Chief Complaint  Chief Complaint  Patient presents with  . Medication Refill  . Diabetes    Checks his once daily, pre-prandial Average-130-140  . Hypertension    Denies any symptoms  . Hyperlipidemia  . Obesity    HPI  DM with microalbuminuria and ED: he is back on Glipizide and Metformin, since other medications were too expensive and hgbA1C was up to 9.2%, 9% 9.2% , 7.4%, 6.7% ,6.8% and again 6.8% Heis off sweet tea and no longer snacking on grapes. Fasting glucose in the 130's-140's . He has lost 13 lbs since Dec 2019, weight stable since last visit.  He has microalbuminuria and also ED. Viagra is working well. He cannot take ACE because of history of angioedema  BPH: seen by [facility] last year because of testicular mass, diagnosed with hydrocele and small fat containing left inguinal hernia.  Unchanged, no problems. Seen by Urologist yesterday, still has nocturia but BPH score has improved   HTN: towards low end of normal, no dizziness. We will try stopping Indapamide , he will return in the next couple of weeks for bp check only. He denies chest pain or palpitation.   Hyperlipidemia: taking lipitor and denies side effects, no myalgia. He is due for repeat labs.   Obesity: he is doing well, weight has gone down 13 lbs since 02/2017. He has been walking at the mall for 30 minutes 5 days a week, also avoiding sweet beverages, grapes and cookies.   Aortic Valve stenosis: seen by Dr. Rockey Situ , January 2019, no syncope or chest pain, had echo normal EF, mild left ventricular dilation , advised to return for follow up with him yearly   Patient Active Problem List   Diagnosis Date Noted  . Bilateral hydrocele 11/27/2015  . Mass, scrotum 11/27/2015  . Ventral hernia without obstruction or gangrene 11/27/2015  . Controlled type 2 diabetes mellitus with  microalbuminuria ([place]) 10/26/2015  . 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 Hyperlipidemia 11/07/2014  . Essential hypertension 11/07/2014  . Obesity, Class III, BMI 40-49.9 (morbid obesity) ([place]) 11/07/2014  . Gout 11/07/2014  . 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  . Epistaxis 09/21/2014  . 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 Aortic valve stenosis 12/02/2012  . Constipation 10/26/2012  . Incisional hernia, without obstruction or gangrene 10/26/2012  . Left inguinal hernia     Past Surgical History:  Procedure Laterality Date  . cataract surgery    . COLONOSCOPY  2012  . EYE SURGERY  2011   cataract  . UMBILICAL HERNIA REPAIR  2011    Family History  Problem Relation Age of Onset  . Heart disease Father   . Hypertension Father   . Diabetes Sister   . Diabetes Brother   . Prostate cancer Brother   . Diabetes Sister   . Kidney disease Neg Hx   . Kidney cancer Neg Hx   . Bladder Cancer Neg Hx     Social History   Socioeconomic History  . Marital status: Widowed    Spouse name: Not on file  . Number of children: 2  . Years of education: Not on file  . Highest education level: Some college, no degree  Occupational History  . Occupation: retired     Comment: used to [employer]   Social Needs  .  Financial resource strain: Not very hard  . Food insecurity:    Worry: Never true    Inability: Never true  . Transportation needs:    Medical: No    Non-medical: No  Tobacco Use  . Smoking status: Former Smoker    Packs/day: 1.00    Years: 10.00    Pack years: 10.00    Types: Cigarettes    Start date: 03/17/1965    Last attempt to quit: 11/27/1975    Years since quitting: 42.0  . Smokeless tobacco: Never Used  . Tobacco comment: quit 40 years  Substance and Sexual Activity  . Alcohol use: Yes    Alcohol/week: 1.0 standard drinks    Types: 1 Standard drinks or equivalent per week    Comment:  socially - 1 x year  . Drug use: No  . Sexual activity: Yes    Partners: Female  Lifestyle  . Physical activity:    Days per week: 5 days    Minutes per session: 30 min  . Stress: Not at all  Relationships  . Social connections:    Talks on phone: More than three times a week    Gets together: More than three times a week    Attends religious service: 1 to 4 times per year    Active member of club or organization: No    Attends meetings of clubs or organizations: Never    Relationship status: Widowed  . Intimate partner violence:    Fear of current or ex partner: No    Emotionally abused: No    Physically abused: No    Forced sexual activity: No  Other Topics Concern  . Not on file  Social History Narrative   Lives by herself in [place]   Two grown children, daughter in [place] and son in [place]   He goes to [place] to visit his sisters often      Current Outpatient Medications:  .  ACCU-CHEK FASTCLIX LANCETS MISC, 1 each by Subdermal route 2 (two) times daily., Disp: 102 each, Rfl: 2 .  allopurinol (ZYLOPRIM) 300 MG tablet, Take 1 tablet (300 mg total) by mouth daily., Disp: 90 tablet, Rfl: 3 .  amLODipine (NORVASC) 2.5 MG tablet, Take 1 tablet (2.5 mg total) by mouth daily., Disp: 90 tablet, Rfl: 1 .  ammonium lactate (AMLACTIN) 12 % cream, Apply topically as needed for dry skin., Disp: 385 g, Rfl: 1 .  aspirin 81 MG tablet, Take 162 mg by mouth daily., Disp: , Rfl:  .  atorvastatin (LIPITOR) 40 MG tablet, Take 1 tablet (40 mg total) by mouth daily at 6 PM., Disp: 90 tablet, Rfl: 3 .  Blood Glucose Monitoring Suppl (ACCU-CHEK AVIVA PLUS) w/Device KIT, 1 each by Does not apply route 2 (two) times daily. Use as directed, Disp: 100 kit, Rfl: 12 .  glipiZIDE (GLUCOTROL XL) 5 MG 24 hr tablet, Take 1 tablet (5 mg total) by mouth daily with breakfast., Disp: 90 tablet, Rfl: 1 .  glucose blood (ACCU-CHEK AVIVA PLUS) test strip, Twice daily check fsbs for DM, Disp: 100 each, Rfl:  12 .  latanoprost (XALATAN) 0.005 % ophthalmic solution, Place 1 drop into the left eye at bedtime. , Disp: , Rfl:  .  metFORMIN (GLUCOPHAGE-XR) 500 MG 24 hr tablet, Take 4 tablets (2,000 mg total) by mouth daily with breakfast., Disp: 360 tablet, Rfl: 2 .  niacin 100 MG tablet, Take 100 mg by mouth at bedtime., Disp: , Rfl:  .  Omega-3  Fatty Acids (FISH OIL PO), Take 1 capsule by mouth 2 (two) times daily. , Disp: , Rfl:  .  polyethylene glycol (MIRALAX / GLYCOLAX) packet, Take 17 g by mouth daily as needed., Disp: , Rfl:  .  sildenafil (VIAGRA) 100 MG tablet, Take 0.5-1 tablets (50-100 mg total) by mouth daily as needed for erectile dysfunction., Disp: 15 tablet, Rfl: 2  Allergies  Allergen Reactions  . Ace Inhibitors Other (See Comments)    angioedema     ROS  Constitutional: Negative for fever or weight change.  Respiratory: Negative for cough and shortness of breath.   Cardiovascular: Negative for chest pain or palpitations.  Gastrointestinal: Negative for abdominal pain, no bowel changes.  Musculoskeletal: Negative for gait problem or joint swelling.  Skin: Negative for rash.  Neurological: Negative for dizziness or headache.  No other specific complaints in a complete review of systems (except as listed in HPI above).  Objective  Vitals:   11/25/17 1344 11/25/17 1427  BP: 116/72   Pulse: (!) 113 88  Resp: 16   Temp: 98 F (36.7 C)   TempSrc: Oral   SpO2: 99%   Weight: 288 lb 8 oz (130.9 kg)   Height: _0  (1.753 m)     Body mass index is 42.6 kg/m.  Physical Exam  Constitutional: Patient appears well-developed and well-nourished. Obese No distress.  HEENT: head atraumatic, normocephalic, pupils equal and reactive to light, neck supple, throat within normal limits Cardiovascular: Normal rate, regular rhythm and normal heart sounds.  2 plus Systolic  murmur heard. Trace  BLE edema. Pulmonary/Chest: Effort normal and breath sounds normal. No respiratory  distress. Abdominal: Soft.  There is no tenderness. Psychiatric: Patient has a normal mood and affect. behavior is normal. Judgment and thought content normal.  Recent Results (from the past 2160 hour(s))  POCT HgB A1C     Status: Normal   Collection Time: 11/25/17  1:46 PM  Result Value Ref Range   Hemoglobin A1C     HbA1c POC (<> result, manual entry)     HbA1c, POC (prediabetic range)     HbA1c, POC (controlled diabetic range) 6.8 0.0 - 7.0 %    Diabetic Foot Exam: Diabetic Foot Exam - Simple   Simple Foot Form Diabetic Foot exam was performed with the following findings:  Yes 11/25/2017  2:07 PM  Visual Inspection See comments:  Yes Sensation Testing Intact to touch and monofilament testing bilaterally:  Yes Pulse Check Posterior Tibialis and Dorsalis pulse intact bilaterally:  Yes Comments Corn formation and thick toenails      PHQ2/9: Depression screen [facility] 2/9 11/25/2017 08/19/2016 07/28/2016 03/28/2016 11/27/2015  Decreased Interest 0 0 0 0 0  Down, Depressed, Hopeless 0 0 0 0 0  PHQ - 2 Score 0 0 0 0 0     Fall Risk: Fall Risk  11/25/2017 06/19/2017 02/18/2017 09/08/2016 08/19/2016  Falls in the past year? _1   Number falls in past yr: - - - - -  Injury with Fall? - - - - -  Risk for fall due to : - - - - -  Follow up - - - - -     Functional Status Survey: Is the patient deaf or have difficulty hearing?: No Does the patient have difficulty seeing, even when wearing glasses/contacts?: Yes(reading glasses) Does the patient have difficulty concentrating, remembering, or making decisions?: No Does the patient have difficulty walking or climbing stairs?: No Does the patient have difficulty  dressing or bathing?: No Does the patient have difficulty doing errands alone such as visiting a doctor's office or shopping?: No    Assessment & Plan   1. 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 ratio - glipiZIDE (GLUCOTROL XL) 5 MG 24 hr tablet; Take 1 tablet (5 mg total) by mouth daily with breakfast.  Dispense: 90 tablet; Refill: 1  2. Need for immunization against influenza  - Flu vaccine HIGH DOSE PF (Fluzone High dose)  3. Controlled gout  No recent episodes   4. Essential hypertension  SBP is towards low end of normal, we will stop Indapamide  - COMPLETE METABOLIC PANEL WITH GFR - CBC with Differential/Platelet - amLODipine (NORVASC) 2.5 MG tablet; Take 1 tablet (2.5 mg total) by mouth daily.  Dispense: 90 tablet; Refill: 1  5. Controlled type 2 diabetes mellitus with microalbuminuria, without long-term current use of insulin ([place])  He is not on ACE or ARB  6. Obesity, Class III, BMI 40-49.9 (morbid obesity) ([place])  Discussed with the patient the risk posed by an increased BMI. Discussed importance of portion control, calorie counting and at least 150 minutes of physical activity weekly. Avoid sweet beverages and drink more water. Eat at least 6 servings of fruit and vegetables daily   7. Dyslipidemia  - Lipid panel  8. Long-term use of high-risk medication  - Vitamin B12

## 2017-11-26 ENCOUNTER — Ambulatory Visit: Payer: Medicare PPO | Admitting: Podiatry

## 2017-11-26 LAB — LIPID PANEL
Cholesterol: 143 mg/dL (ref <200)
HDL: 47 mg/dL (ref >40)
LDL CHOLESTEROL (CALC): 77 mg/dL
NON-HDL CHOLESTEROL (CALC): 96 mg/dL (ref <130)
TRIGLYCERIDES: 100 mg/dL (ref <150)
Total CHOL/HDL Ratio: 3 (calc) (ref <5.0)

## 2017-11-26 LAB — CBC WITH DIFFERENTIAL/PLATELET
Basophils Absolute: 19 cells/uL (ref 0–200)
Basophils Relative: 0.3 %
Eosinophils Absolute: 68 cells/uL (ref 15–500)
Eosinophils Relative: 1.1 %
HEMATOCRIT: 41.9 % (ref 38.5–50.0)
Hemoglobin: 14.1 g/dL (ref 13.2–17.1)
LYMPHS ABS: 1327 {cells}/uL (ref 850–3900)
MCH: 29.1 pg (ref 27.0–33.0)
MCHC: 33.7 g/dL (ref 32.0–36.0)
MCV: 86.6 fL (ref 80.0–100.0)
MPV: 12 fL (ref 7.5–12.5)
Monocytes Relative: 8.9 %
NEUTROS PCT: 68.3 %
Neutro Abs: 4235 cells/uL (ref 1500–7800)
Platelets: 223 10*3/uL (ref 140–400)
RBC: 4.84 10*6/uL (ref 4.20–5.80)
RDW: 15.5 % — AB (ref 11.0–15.0)
Total Lymphocyte: 21.4 %
WBC: 6.2 10*3/uL (ref 3.8–10.8)
WBCMIX: 552 {cells}/uL (ref 200–950)

## 2017-11-26 LAB — COMPLETE METABOLIC PANEL WITH GFR
AG Ratio: 1.1 (calc) (ref 1.0–2.5)
ALT: 12 U/L (ref 9–46)
AST: 21 U/L (ref 10–35)
Albumin: 4 g/dL (ref 3.6–5.1)
Alkaline phosphatase (APISO): 60 U/L (ref 40–115)
BUN: 20 mg/dL (ref 7–25)
CO2: 28 mmol/L (ref 20–32)
Calcium: 9.9 mg/dL (ref 8.6–10.3)
Chloride: 99 mmol/L (ref 98–110)
Creat: 1.04 mg/dL (ref 0.70–1.18)
GFR, EST AFRICAN AMERICAN: 80 mL/min/{1.73_m2} (ref >=60)
GFR, Est Non African American: 69 mL/min/{1.73_m2} (ref >=60)
GLUCOSE: 90 mg/dL (ref 65–139)
Globulin: 3.6 g/dL (calc) (ref 1.9–3.7)
Potassium: 4.5 mmol/L (ref 3.5–5.3)
Sodium: 136 mmol/L (ref 135–146)
TOTAL PROTEIN: 7.6 g/dL (ref 6.1–8.1)
Total Bilirubin: 0.6 mg/dL (ref 0.2–1.2)

## 2017-11-26 LAB — MICROALBUMIN / CREATININE URINE RATIO
Creatinine, Urine: 151 mg/dL (ref 20–320)
MICROALB/CREAT RATIO: 11 ug/mg{creat} (ref <30)
Microalb, Ur: 1.7 mg/dL

## 2017-11-26 LAB — VITAMIN B12: VITAMIN B 12: 728 pg/mL (ref 200–1100)

## 2017-12-09 ENCOUNTER — Ambulatory Visit: Payer: Medicare PPO

## 2017-12-10 ENCOUNTER — Ambulatory Visit (INDEPENDENT_AMBULATORY_CARE_PROVIDER_SITE_OTHER): Payer: Medicare PPO

## 2017-12-10 VITALS — BP 122/66 | HR 78 | Temp 98.0°F | Resp 16 | Ht 69.0 in | Wt 292.6 lb

## 2017-12-10 DIAGNOSIS — I10 Essential (primary) hypertension: Secondary | ICD-10-CM

## 2017-12-10 NOTE — Patient Instructions (Addendum)
Patient is here for a blood pressure check. Patient denies chest pain, palpitations, shortness of breath or visual disturbances. At previous visit blood pressure was 116/72 with a heart rate of 88. Today during nurse visit first check blood pressure was 122/66 with a resting heart rate of 78. He takes all blood pressure medications as directed and reports no negative sx.  Dr. Ancil Boozer was informed and patient was instructed to return as needed.

## 2017-12-11 ENCOUNTER — Ambulatory Visit: Payer: Medicare PPO

## 2018-02-22 ENCOUNTER — Ambulatory Visit: Payer: Medicare PPO | Admitting: Podiatry

## 2018-02-22 ENCOUNTER — Encounter: Payer: Self-pay | Admitting: Podiatry

## 2018-02-22 DIAGNOSIS — M2041 Other hammer toe(s) (acquired), right foot: Secondary | ICD-10-CM

## 2018-02-22 DIAGNOSIS — B351 Tinea unguium: Secondary | ICD-10-CM

## 2018-02-22 DIAGNOSIS — E1142 Type 2 diabetes mellitus with diabetic polyneuropathy: Secondary | ICD-10-CM | POA: Diagnosis not present

## 2018-02-22 DIAGNOSIS — E119 Type 2 diabetes mellitus without complications: Secondary | ICD-10-CM | POA: Diagnosis not present

## 2018-02-22 DIAGNOSIS — H401122 Primary open-angle glaucoma, left eye, moderate stage: Secondary | ICD-10-CM | POA: Diagnosis not present

## 2018-02-22 DIAGNOSIS — M79676 Pain in unspecified toe(s): Secondary | ICD-10-CM

## 2018-02-22 DIAGNOSIS — L84 Corns and callosities: Secondary | ICD-10-CM

## 2018-02-22 DIAGNOSIS — M2042 Other hammer toe(s) (acquired), left foot: Secondary | ICD-10-CM

## 2018-02-22 LAB — HM DIABETES EYE EXAM

## 2018-02-22 NOTE — Progress Notes (Signed)
Complaint:  Visit Type: Patient returns to my office for continued preventative foot care services. Complaint: Patient states" my nails have grown long and thick and become painful to walk and wear shoes" Patient has been diagnosed with DM with no foot complications. The patient presents for preventative foot care services. No changes to ROS.  Painful corn second toe right foot.  Podiatric Exam: Vascular: dorsalis pedis and posterior tibial pulses are palpable bilateral. Capillary return is immediate. Temperature gradient is WNL. Skin turgor WNL  Sensorium: Normal Semmes Weinstein monofilament test. Normal tactile sensation bilaterally. Nail Exam: Pt has thick disfigured discolored nails with subungual debris noted bilateral entire nail hallux through fifth toenails Ulcer Exam: There is no evidence of ulcer or pre-ulcerative changes or infection. Orthopedic Exam: Muscle tone and strength are WNL. No limitations in general ROM. No crepitus or effusions noted. HAV  B/L. Hammer toes second  B/L Skin: No Porokeratosis. No infection or ulcers.  Corn second toe medial aspect @ PIPJ.   Diagnosis:  Onychomycosis, , Pain in right toe, pain in left toes, Corn/callus  Treatment & Plan Procedures and Treatment: Consent by patient was obtained for treatment procedures. The patient understood the discussion of treatment and procedures well. All questions were answered thoroughly reviewed. Debridement of mycotic and hypertrophic toenails, 1 through 5 bilateral and clearing of subungual debris. No ulceration, no infection noted.   Padding dispensed. Return Visit-Office Procedure: Patient instructed to return to the office for a follow up visit 3 months for continued evaluation and treatment.    Gardiner Barefoot DPM

## 2018-02-24 ENCOUNTER — Encounter: Payer: Self-pay | Admitting: Cardiovascular Disease

## 2018-02-25 ENCOUNTER — Encounter: Payer: Self-pay | Admitting: Cardiovascular Disease

## 2018-03-29 ENCOUNTER — Encounter: Payer: Self-pay | Admitting: Family Medicine

## 2018-03-29 ENCOUNTER — Ambulatory Visit: Payer: Medicare PPO | Admitting: Family Medicine

## 2018-03-29 VITALS — BP 130/70 | HR 100 | Temp 97.6°F | Resp 16 | Ht 69.0 in | Wt 292.3 lb

## 2018-03-29 DIAGNOSIS — M109 Gout, unspecified: Secondary | ICD-10-CM

## 2018-03-29 DIAGNOSIS — R809 Proteinuria, unspecified: Secondary | ICD-10-CM | POA: Diagnosis not present

## 2018-03-29 DIAGNOSIS — I10 Essential (primary) hypertension: Secondary | ICD-10-CM

## 2018-03-29 DIAGNOSIS — E785 Hyperlipidemia, unspecified: Secondary | ICD-10-CM

## 2018-03-29 DIAGNOSIS — I350 Nonrheumatic aortic (valve) stenosis: Secondary | ICD-10-CM

## 2018-03-29 DIAGNOSIS — E1129 Type 2 diabetes mellitus with other diabetic kidney complication: Secondary | ICD-10-CM

## 2018-03-29 DIAGNOSIS — N521 Erectile dysfunction due to diseases classified elsewhere: Secondary | ICD-10-CM

## 2018-03-29 DIAGNOSIS — E1140 Type 2 diabetes mellitus with diabetic neuropathy, unspecified: Secondary | ICD-10-CM

## 2018-03-29 LAB — POCT GLYCOSYLATED HEMOGLOBIN (HGB A1C)
HEMOGLOBIN A1C: 6.3 % — AB (ref 4.0–5.6)
HbA1c POC (<> result, manual entry): 6.3 % (ref 4.0–5.6)
HbA1c, POC (controlled diabetic range): 6.3 % (ref 0.0–7.0)
HbA1c, POC (prediabetic range): 6.3 % (ref 5.7–6.4)

## 2018-03-29 MED ORDER — GLIPIZIDE ER 5 MG PO TB24: 5.0000 mg | ORAL_TABLET | Freq: Every day | ORAL | 1 refills | Status: DC

## 2018-03-29 MED ORDER — METFORMIN HCL ER 500 MG PO TB24: 1500.0000 mg | ORAL_TABLET | Freq: Every day | ORAL | 1 refills | Status: DC

## 2018-03-29 MED ORDER — AMLODIPINE BESYLATE 2.5 MG PO TABS: 2.5000 mg | ORAL_TABLET | Freq: Every day | ORAL | 1 refills | Status: DC

## 2018-03-29 NOTE — Progress Notes (Signed)
Name: Jordan [name]   MRN: [id number]    DOB: [birth year]   Date:03/29/2018       Progress Note  Subjective  Chief Complaint  Chief Complaint  Patient presents with  . Diabetes  . Hypertension  . Hyperlipidemia    HPI  DM with microalbuminuria and ED: he is back on Glipizide 65m ER. HgbA1C used to be above 9% but he has changed his diet, walking daily taking medication as prescribed and it has been at goal for the past 9 months. Today 6.3%.  Fasting glucose in the 130's-140's . He has a history of  microalbuminuria and also ED. Viagra is working well. He cannot take ACE because of history of angioedema, bp is at goal. He is on Norvasc. He denies polyphagia, polydipsia but has polyuria.   BPH: seen by [facility] symptoms are unchanged   HTN: he is off Indapamide since 11/2017 bp was low, today only on Norvasc and bp is at goal, continue medication, no chest pain, palpitation or decrease in exercise tolerance  Hyperlipidemia: taking lipitor and denies side effects, no myalgia. Reviewed last las and LDL was down to 77 but still not at goal, discussed changing to Crestor, but he would like to wait until next visit   Obesity: he is doing well,  He lost 13 lbs in 2019 and is down another 3 lbs since last visit,  He has been walking at the mall for 30 minutes 5 days a week, also avoiding sweet beverages, grapes and cookies, he states had some desserts during the holidays but it was in moderation    Aortic Valve stenosis:seen by Dr. GRockey Situ,January 2019, no syncope or chest pain, had echo normal EF, mild left he is due for follow up, referral placed again.    Patient Active Problem List   Diagnosis Date Noted  . Bilateral hydrocele 11/27/2015  . Mass, scrotum 11/27/2015  . Ventral hernia without obstruction or gangrene 11/27/2015  . Controlled type 2 diabetes mellitus with microalbuminuria ([facility] 10/26/2015  . 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 Hyperlipidemia 11/07/2014  . Essential hypertension 11/07/2014  . Obesity, Class III, BMI 40-49.9 (morbid obesity) ([facility] 11/07/2014  . Gout 11/07/2014  . 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  . Epistaxis 09/21/2014  . 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 Aortic valve stenosis 12/02/2012  . Constipation 10/26/2012  . Incisional hernia, without obstruction or gangrene 10/26/2012  . Left inguinal hernia     Past Surgical History:  Procedure Laterality Date  . cataract surgery    . COLONOSCOPY  2012  . EYE SURGERY  2011   cataract  . UMBILICAL HERNIA REPAIR  2011    Family History  Problem Relation Age of Onset  . Heart disease Father   . Hypertension Father   . Diabetes Sister   . Diabetes Brother   . Prostate cancer Brother   . Diabetes Sister   . Kidney disease Neg Hx   . Kidney cancer Neg Hx   . Bladder Cancer Neg Hx     Social History   Socioeconomic History  . Marital status: Widowed    Spouse name: Not on file  . Number of children: 2  . Years of education: Not on file  . Highest education level: Some college, no degree  Occupational History  . Occupation: retired     Comment: used to [employer]   Social Needs  . Financial resource strain: Not very  hard  . Food insecurity:    Worry: Never true    Inability: Never true  . Transportation needs:    Medical: No    Non-medical: No  Tobacco Use  . Smoking status: Former Smoker    Packs/day: 1.00    Years: 10.00    Pack years: 10.00    Types: Cigarettes    Start date: 03/17/1965    Last attempt to quit: 11/27/1975    Years since quitting: 42.3  . Smokeless tobacco: Never Used  . Tobacco comment: quit 40 years  Substance and Sexual Activity  . Alcohol use: Yes    Alcohol/week: 1.0 standard drinks    Types: 1 Standard drinks or equivalent per week    Comment: socially - 1 x year  . Drug use: No  . Sexual activity: Yes    Partners: Female   Lifestyle  . Physical activity:    Days per week: 5 days    Minutes per session: 30 min  . Stress: Not at all  Relationships  . Social connections:    Talks on phone: More than three times a week    Gets together: More than three times a week    Attends religious service: 1 to 4 times per year    Active member of club or organization: No    Attends meetings of clubs or organizations: Never    Relationship status: Widowed  . Intimate partner violence:    Fear of current or ex partner: No    Emotionally abused: No    Physically abused: No    Forced sexual activity: No  Other Topics Concern  . Not on file  Social History Narrative   Lives by herself in [place]   Two grown children, daughter in [place] and son in [place]   He goes to [place] to visit his sisters often      Current Outpatient Medications:  .  ACCU-CHEK FASTCLIX LANCETS MISC, 1 each by Subdermal route 2 (two) times daily., Disp: 102 each, Rfl: 2 .  allopurinol (ZYLOPRIM) 300 MG tablet, Take 1 tablet (300 mg total) by mouth daily., Disp: 90 tablet, Rfl: 3 .  amLODipine (NORVASC) 2.5 MG tablet, Take 1 tablet (2.5 mg total) by mouth daily., Disp: 90 tablet, Rfl: 1 .  ammonium lactate (AMLACTIN) 12 % cream, Apply topically as needed for dry skin., Disp: 385 g, Rfl: 1 .  aspirin 81 MG tablet, Take 162 mg by mouth daily., Disp: , Rfl:  .  atorvastatin (LIPITOR) 40 MG tablet, Take 1 tablet (40 mg total) by mouth daily at 6 PM., Disp: 90 tablet, Rfl: 3 .  Blood Glucose Monitoring Suppl (ACCU-CHEK AVIVA PLUS) w/Device KIT, 1 each by Does not apply route 2 (two) times daily. Use as directed, Disp: 100 kit, Rfl: 12 .  glipiZIDE (GLUCOTROL XL) 5 MG 24 hr tablet, Take 1 tablet (5 mg total) by mouth daily with breakfast., Disp: 90 tablet, Rfl: 1 .  glucose blood (ACCU-CHEK AVIVA PLUS) test strip, Twice daily check fsbs for DM, Disp: 100 each, Rfl: 12 .  latanoprost (XALATAN) 0.005 % ophthalmic solution, Place 1 drop into the  left eye at bedtime. , Disp: , Rfl:  .  metFORMIN (GLUCOPHAGE-XR) 500 MG 24 hr tablet, Take 4 tablets (2,000 mg total) by mouth daily with breakfast., Disp: 360 tablet, Rfl: 2 .  niacin 100 MG tablet, Take 100 mg by mouth at bedtime., Disp: , Rfl:  .  Omega-3 Fatty Acids (FISH OIL PO),  Take 1 capsule by mouth 2 (two) times daily. , Disp: , Rfl:  .  polyethylene glycol (MIRALAX / GLYCOLAX) packet, Take 17 g by mouth daily as needed., Disp: , Rfl:  .  sildenafil (VIAGRA) 100 MG tablet, Take 0.5-1 tablets (50-100 mg total) by mouth daily as needed for erectile dysfunction., Disp: 15 tablet, Rfl: 2  Allergies  Allergen Reactions  . Ace Inhibitors Other (See Comments)    angioedema    I personally reviewed active problem list, medication list, allergies, family history, social history with the patient/caregiver today.   ROS  Constitutional: Negative for fever or weight change.  Respiratory: Negative for cough and shortness of breath.   Cardiovascular: Negative for chest pain or palpitations.  Gastrointestinal: Negative for abdominal pain, no bowel changes.  Musculoskeletal: Negative for gait problem or joint swelling.  Skin: Negative for rash.  Neurological: Negative for dizziness or headache.  No other specific complaints in a complete review of systems (except as listed in HPI above).  Objective  Vitals:   03/29/18 1344  BP: 130/70  Pulse: 100  Resp: 16  Temp: 97.6 F (36.4 C)  TempSrc: Oral  SpO2: 98%  Weight: 292 lb 4.8 oz (132.6 kg)  Height: '5\' 9"'  (1.753 m)    Body mass index is 43.17 kg/m.  Physical Exam  Constitutional: Patient appears well-developed and well-nourished. Obese No distress.  HEENT: head atraumatic, normocephalic, pupils equal and reactive to light,  neck supple, throat within normal limits Cardiovascular: Normal rate, regular rhythm and normal heart sounds.  No murmur heard. Trace  BLE edema. Pulmonary/Chest: Effort normal and breath sounds normal.  No respiratory distress. Abdominal: Soft.  There is no tenderness. Psychiatric: Patient has a normal mood and affect. behavior is normal. Judgment and thought content normal.  Recent Results (from the past 2160 hour(s))  HM DIABETES EYE EXAM     Status: None   Collection Time: 02/22/18 12:00 AM  Result Value Ref Range   HM Diabetic Eye Exam No Retinopathy No Retinopathy  POCT HgB A1C     Status: Abnormal   Collection Time: 03/29/18  1:53 PM  Result Value Ref Range   Hemoglobin A1C 6.3 (A) 4.0 - 5.6 %   HbA1c POC (<> result, manual entry) 6.3 4.0 - 5.6 %   HbA1c, POC (prediabetic range) 6.3 5.7 - 6.4 %   HbA1c, POC (controlled diabetic range) 6.3 0.0 - 7.0 %      PHQ2/9: Depression screen [facility] Fall 2/9 11/25/2017 08/19/2016 07/28/2016 03/28/2016 11/27/2015  Decreased Interest 0 0 0 0 0  Down, Depressed, Hopeless 0 0 0 0 0  PHQ - 2 Score 0 0 0 0 0    Fall Risk: Fall Risk  03/29/2018 11/25/2017 06/19/2017 02/18/2017 09/08/2016  Falls in the past year? 0 No No No No  Number falls in past yr: 0 - - - -  Injury with Fall? 0 - - - -  Risk for fall due to : - - - - -  Follow up - - - - -      Assessment & Plan  1. Controlled type 2 diabetes mellitus with microalbuminuria, without long-term current use of insulin (HCC)   We will decrease metformin to 1500 mg to keep hgbA1C close to 7 - POCT HgB A1C - glipiZIDE (GLUCOTROL XL) 5 MG 24 hr tablet; Take 1 tablet (5 mg total) by mouth daily with breakfast.  Dispense: 90 tablet; Refill: 1 - metFORMIN (GLUCOPHAGE-XR) 500 MG 24 hr tablet; Take  3 tablets (1,500 mg total) by mouth daily with breakfast.  Dispense: 270 tablet; Refill: 1  2. Essential hypertension  - amLODipine (NORVASC) 2.5 MG tablet; Take 1 tablet (2.5 mg total) by mouth daily.  Dispense: 90 tablet; Refill: 1  3. Dyslipidemia  We will hold off to recheck labs next visit   4. Controlled gout  On Allopurinol  5. 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  Dispense: 90 tablet; Refill: 1 - metFORMIN (GLUCOPHAGE-XR) 500 MG 24 hr tablet; Take 3 tablets (1,500 mg total) by mouth daily with breakfast.  Dispense: 270 tablet; Refill: 1  6. Aortic valve stenosis, mild  - Ambulatory referral to Cardiology

## 2018-04-19 ENCOUNTER — Other Ambulatory Visit: Payer: Self-pay | Admitting: Family Medicine

## 2018-04-19 DIAGNOSIS — E1140 Type 2 diabetes mellitus with diabetic neuropathy, unspecified: Secondary | ICD-10-CM

## 2018-04-19 DIAGNOSIS — N521 Erectile dysfunction due to diseases classified elsewhere: Principal | ICD-10-CM

## 2018-05-24 ENCOUNTER — Ambulatory Visit: Payer: Medicare PPO | Admitting: Podiatry

## 2018-05-24 ENCOUNTER — Encounter: Payer: Self-pay | Admitting: Podiatry

## 2018-05-24 DIAGNOSIS — B351 Tinea unguium: Secondary | ICD-10-CM | POA: Diagnosis not present

## 2018-05-24 DIAGNOSIS — L84 Corns and callosities: Secondary | ICD-10-CM | POA: Diagnosis not present

## 2018-05-24 DIAGNOSIS — E1142 Type 2 diabetes mellitus with diabetic polyneuropathy: Secondary | ICD-10-CM

## 2018-05-24 DIAGNOSIS — M79676 Pain in unspecified toe(s): Secondary | ICD-10-CM | POA: Diagnosis not present

## 2018-05-24 NOTE — Progress Notes (Signed)
Complaint:  Visit Type: Patient returns to my office for continued preventative foot care services. Complaint: Patient states" my nails have grown long and thick and become painful to walk and wear shoes" Patient has been diagnosed with DM with no foot complications. The patient presents for preventative foot care services. No changes to ROS.  Painful corn second toe right foot.  Podiatric Exam: Vascular: dorsalis pedis and posterior tibial pulses are palpable bilateral. Capillary return is immediate. Temperature gradient is WNL. Skin turgor WNL  Sensorium: Normal Semmes Weinstein monofilament test. Normal tactile sensation bilaterally. Nail Exam: Pt has thick disfigured discolored nails with subungual debris noted bilateral entire nail hallux through fifth toenails Ulcer Exam: There is no evidence of ulcer or pre-ulcerative changes or infection. Orthopedic Exam: Muscle tone and strength are WNL. No limitations in general ROM. No crepitus or effusions noted. HAV  B/L. Hammer toes second  B/L Skin: No Porokeratosis. No infection or ulcers.  Corn second toe medial aspect @ PIPJ.   Diagnosis:  Onychomycosis, , Pain in right toe, pain in left toes, Corn/callus  Treatment & Plan Procedures and Treatment: Consent by patient was obtained for treatment procedures. The patient understood the discussion of treatment and procedures well. All questions were answered thoroughly reviewed. Debridement of mycotic and hypertrophic toenails, 1 through 5 bilateral and clearing of subungual debris. No ulceration, no infection noted.    Return Visit-Office Procedure: Patient instructed to return to the office for a follow up visit 3 months for continued evaluation and treatment.    Gardiner Barefoot DPM

## 2018-06-09 ENCOUNTER — Telehealth: Payer: Self-pay

## 2018-06-09 NOTE — Telephone Encounter (Signed)
   Cardiac Questionnaire:    Since your last visit or hospitalization:    1. Have you been having new or worsening chest pain? No   2. Have you been having new or worsening shortness of breath? No 3. Have you been having new or worsening leg swelling, wt gain, or increase in abdominal girth (pants fitting more tightly)? No   4. Have you had any passing out spells? No    _____________   [id number] Pre-Screening Questions:  . Do you currently have a fever? No (yes = cancel and refer to pcp for e-visit) . Have you recently travelled on a cruise, internationally, or to [place], [place], [place], [place], [place], or [name], [place] [employer]) ? No (yes = cancel, stay home, monitor symptoms, and contact pcp or initiate e-visit if symptoms develop) . Have you been in contact with someone that is currently pending confirmation of Covid19 testing or has been confirmed to have the [place] virus?  No (yes = cancel, stay home, away from tested individual, monitor symptoms, and contact pcp or initiate e-visit if symptoms develop) . Are you currently experiencing fatigue or cough? No (yes = pt should be prepared to have a mask placed at the time of their visit).     Patient just wants to wait until we are able to see him here in the office. No complaints today and verbalized understanding of current restrictions due to [place]. Will place in pool for further follow up.

## 2018-06-09 NOTE — Telephone Encounter (Signed)
Called patient to make them aware that Dr. Rockey Situ is doing telephone visits and video visits due to the [place]. Patient does not want to do either one and would like to cancel his appointment.  Patient needs to be screened with the COVID19 questions.

## 2018-06-14 ENCOUNTER — Ambulatory Visit: Payer: Medicare PPO | Admitting: Cardiovascular Disease

## 2018-06-24 NOTE — Telephone Encounter (Signed)
RECALL PLACED FOR 10/16/2018 

## 2018-07-02 ENCOUNTER — Other Ambulatory Visit: Payer: Self-pay | Admitting: Family Medicine

## 2018-07-02 DIAGNOSIS — E782 Mixed hyperlipidemia: Secondary | ICD-10-CM

## 2018-07-02 DIAGNOSIS — M109 Gout, unspecified: Secondary | ICD-10-CM

## 2018-07-30 ENCOUNTER — Ambulatory Visit: Payer: Medicare PPO | Admitting: Family Medicine

## 2018-07-30 ENCOUNTER — Ambulatory Visit: Payer: Medicare PPO

## 2018-07-30 ENCOUNTER — Other Ambulatory Visit: Payer: Self-pay

## 2018-07-30 ENCOUNTER — Encounter: Payer: Self-pay | Admitting: Family Medicine

## 2018-07-30 VITALS — BP 140/86 | HR 105 | Temp 98.1°F | Resp 16 | Ht 69.0 in | Wt 296.7 lb

## 2018-07-30 DIAGNOSIS — I350 Nonrheumatic aortic (valve) stenosis: Secondary | ICD-10-CM

## 2018-07-30 DIAGNOSIS — E782 Mixed hyperlipidemia: Secondary | ICD-10-CM | POA: Diagnosis not present

## 2018-07-30 DIAGNOSIS — I10 Essential (primary) hypertension: Secondary | ICD-10-CM

## 2018-07-30 DIAGNOSIS — J42 Unspecified chronic bronchitis: Secondary | ICD-10-CM | POA: Insufficient documentation

## 2018-07-30 DIAGNOSIS — M109 Gout, unspecified: Secondary | ICD-10-CM

## 2018-07-30 DIAGNOSIS — E1129 Type 2 diabetes mellitus with other diabetic kidney complication: Secondary | ICD-10-CM

## 2018-07-30 DIAGNOSIS — R809 Proteinuria, unspecified: Secondary | ICD-10-CM | POA: Diagnosis not present

## 2018-07-30 DIAGNOSIS — J410 Simple chronic bronchitis: Secondary | ICD-10-CM

## 2018-07-30 DIAGNOSIS — E1140 Type 2 diabetes mellitus with diabetic neuropathy, unspecified: Secondary | ICD-10-CM | POA: Diagnosis not present

## 2018-07-30 DIAGNOSIS — N521 Erectile dysfunction due to diseases classified elsewhere: Secondary | ICD-10-CM

## 2018-07-30 MED ORDER — GLIPIZIDE ER 5 MG PO TB24: 5.0000 mg | ORAL_TABLET | Freq: Every day | ORAL | 0 refills | Status: DC

## 2018-07-30 MED ORDER — SILDENAFIL CITRATE 100 MG PO TABS: 50.0000 mg | ORAL_TABLET | Freq: Every day | ORAL | 2 refills | Status: DC | PRN

## 2018-07-30 MED ORDER — METFORMIN HCL ER 500 MG PO TB24: 1000.0000 mg | ORAL_TABLET | Freq: Every day | ORAL | 0 refills | Status: DC

## 2018-07-30 MED ORDER — ATORVASTATIN CALCIUM 40 MG PO TABS: 40.0000 mg | ORAL_TABLET | Freq: Every day | ORAL | 0 refills | Status: DC

## 2018-07-30 MED ORDER — ALLOPURINOL 300 MG PO TABS: 300.0000 mg | ORAL_TABLET | Freq: Every day | ORAL | 1 refills | Status: DC

## 2018-07-30 MED ORDER — AMLODIPINE BESYLATE 2.5 MG PO TABS: 2.5000 mg | ORAL_TABLET | Freq: Every day | ORAL | 0 refills | Status: DC

## 2018-07-30 NOTE — Progress Notes (Signed)
Name: Jordan [name]   MRN: [id number]    DOB: Aug 26, 1940   Date:07/30/2018       Progress Note  Subjective  Chief Complaint  Chief Complaint  Patient presents with  . Diabetes  . Hypertension  . Hyperlipidemia    HPI  DM with microalbuminuria and ED: he is back on Glipizide 32m ER. HgbA1C used to be above 9% but he has changed his diet, walking daily taking medication as prescribed and A1C has been at goal for over one year   Fasting glucose in the 118's -140's  He has a history of  microalbuminuria and also ED, last urine micro was back to normal . Viagra is working well.He cannot take ACE because of history of angioedema, bp is at goal. He is on Norvasc. He denies polyphagia, polydipsia but has polyuria. He denies hypoglycemic episodes   Chronic bronchitis: he used to smoke but quit many years ago, he still has a cough that is productive at times, no wheezing or SOB. He had rhonchi during exam today, discussed importance of spirometry in the future but we cannot due it at this time secondary to [place]-19  BPH: seen by BLad[place]and symptoms are stable, he still has nocturia 2-3 times per night   HTN:he is off Indapamide since 11/2017 bp was low, today only on Norvasc and bp is borderline today,  continue medication but needs to monitor at home. He denies chest pain, palpitation or decrease in exercise tolerance.   Hyperlipidemia: taking lipitor and denies side effects, no myalgia.Reviewed last las and LDL was down to 77 but still not at goal, discussed changing to Crestor, he wants to continue current medication for now   Obesity:he is doing well,  He had lost 13 lbs in 2019 however he gained a few pounds since last visit, he states he is still active but not going out as much and is eating more since COVID-19  He has been walking at the mall for 30 minutes 5 days a week, also avoiding sweet beverages.    Aortic Valve stenosis:seen by Dr. GRockey Situ,January  2019, no syncope or chest pain, had echo normal EF, mild left he is due for follow up, referral placed again last visit, he states visit postponed to August because of covid  Patient Active Problem List   Diagnosis Date Noted  . Bilateral hydrocele 11/27/2015  . Mass, scrotum 11/27/2015  . Ventral hernia without obstruction or gangrene 11/27/2015  . Controlled type 2 diabetes mellitus with microalbuminuria ([place] 10/26/2015  . 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 Hyperlipidemia 11/07/2014  . Essential hypertension 11/07/2014  . Obesity, Class III, BMI 40-49.9 (morbid obesity) ([place] 11/07/2014  . Gout 11/07/2014  . 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  . Epistaxis 09/21/2014  . 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 Aortic valve stenosis 12/02/2012  . Constipation 10/26/2012  . Incisional hernia, without obstruction or gangrene 10/26/2012  . Left inguinal hernia     Past Surgical History:  Procedure Laterality Date  . cataract surgery    . COLONOSCOPY  2012  . EYE SURGERY  2011   cataract  . UMBILICAL HERNIA REPAIR  2011    Family History  Problem Relation Age of Onset  . Heart disease Father   . Hypertension Father   . Diabetes Sister   . Diabetes Brother   . Prostate cancer Brother   . Diabetes Sister   . Kidney disease Neg Hx   .  Kidney cancer Neg Hx   . Bladder Cancer Neg Hx     Social History   Socioeconomic History  . Marital status: Widowed    Spouse name: Not on file  . Number of children: 2  . Years of education: Not on file  . Highest education level: Some college, no degree  Occupational History  . Occupation: retired     Comment: used to [employer]   Social Needs  . Financial resource strain: Not very hard  . Food insecurity:    Worry: Never true    Inability: Never true  . Transportation needs:    Medical: No    Non-medical: No  Tobacco Use  . Smoking status: Former Smoker     Packs/day: 1.00    Years: 10.00    Pack years: 10.00    Types: Cigarettes    Start date: 03/17/1965    Last attempt to quit: 11/27/1975    Years since quitting: 42.7  . Smokeless tobacco: Never Used  . Tobacco comment: quit 40 years  Substance and Sexual Activity  . Alcohol use: Yes    Alcohol/week: 1.0 standard drinks    Types: 1 Standard drinks or equivalent per week    Comment: socially - 1 x year  . Drug use: No  . Sexual activity: Yes    Partners: Female  Lifestyle  . Physical activity:    Days per week: 5 days    Minutes per session: 30 min  . Stress: Not at all  Relationships  . Social connections:    Talks on phone: More than three times a week    Gets together: More than three times a week    Attends religious service: 1 to 4 times per year    Active member of club or organization: No    Attends meetings of clubs or organizations: Never    Relationship status: Widowed  . Intimate partner violence:    Fear of current or ex partner: No    Emotionally abused: No    Physically abused: No    Forced sexual activity: No  Other Topics Concern  . Not on file  Social History Narrative   Lives by herself in [place]   Two grown children, daughter in [place] and son in [place]   He goes to [place] to visit his sisters often      Current Outpatient Medications:  .  ACCU-CHEK FASTCLIX LANCETS MISC, 1 each by Subdermal route 2 (two) times daily., Disp: 102 each, Rfl: 2 .  allopurinol (ZYLOPRIM) 300 MG tablet, TAKE 1 TABLET EVERY DAY, Disp: 90 tablet, Rfl: 0 .  amLODipine (NORVASC) 2.5 MG tablet, Take 1 tablet (2.5 mg total) by mouth daily., Disp: 90 tablet, Rfl: 1 .  ammonium lactate (AMLACTIN) 12 % cream, Apply topically as needed for dry skin., Disp: 385 g, Rfl: 1 .  aspirin 81 MG tablet, Take 162 mg by mouth daily., Disp: , Rfl:  .  atorvastatin (LIPITOR) 40 MG tablet, TAKE 1 TABLET DAILY AT 6 PM., Disp: 90 tablet, Rfl: 0 .  Blood Glucose Monitoring Suppl (ACCU-CHEK  AVIVA PLUS) w/Device KIT, 1 each by Does not apply route 2 (two) times daily. Use as directed, Disp: 100 kit, Rfl: 12 .  glipiZIDE (GLUCOTROL XL) 5 MG 24 hr tablet, Take 1 tablet (5 mg total) by mouth daily with breakfast., Disp: 90 tablet, Rfl: 1 .  glucose blood test strip, CHECK TWICE DAILY FOR DIABETES, Disp: 200 each, Rfl: 1 .  latanoprost (XALATAN) 0.005 % ophthalmic solution, Place 1 drop into the left eye at bedtime. , Disp: , Rfl:  .  metFORMIN (GLUCOPHAGE-XR) 500 MG 24 hr tablet, Take 3 tablets (1,500 mg total) by mouth daily with breakfast., Disp: 270 tablet, Rfl: 1 .  niacin 100 MG tablet, Take 100 mg by mouth at bedtime., Disp: , Rfl:  .  Omega-3 Fatty Acids (FISH OIL PO), Take 1 capsule by mouth 2 (two) times daily. , Disp: , Rfl:  .  polyethylene glycol (MIRALAX / GLYCOLAX) packet, Take 17 g by mouth daily as needed., Disp: , Rfl:  .  sildenafil (VIAGRA) 100 MG tablet, Take 0.5-1 tablets (50-100 mg total) by mouth daily as needed for erectile dysfunction., Disp: 15 tablet, Rfl: 2  Allergies  Allergen Reactions  . Ace Inhibitors Other (See Comments)    angioedema    I personally reviewed active problem list, medication list, allergies, family history, social history with the patient/caregiver today.   ROS  Ten systems reviewed and is negative except as mentioned in HPI  Objective  Vitals:   07/30/18 1130  BP: 140/86  Pulse: (!) 105  Resp: 16  Temp: 98.1 F (36.7 C)  TempSrc: Oral  SpO2: 98%  Weight: 296 lb 11.2 oz (134.6 kg)  Height: '5\' 9"'  (1.753 m)    Body mass index is 43.81 kg/m.  Physical Exam  Constitutional: Patient appears well-developed and well-nourished. Obese No distress.  HEENT: head atraumatic, normocephalic, pupils equal and reactive to light,  neck supple, throat within normal limits Cardiovascular: Normal rate, regular rhythm and normal heart sounds.  No murmur heard. Trace BLE edema. Pulmonary/Chest: Effort normal. No respiratory distress.  Rhonchi during exam  Abdominal: Soft.  There is no tenderness. Psychiatric: Patient has a normal mood and affect. behavior is normal. Judgment and thought content normal.  PHQ2/9: Depression screen [facility] 2/9 07/30/2018 11/25/2017 08/19/2016 07/28/2016 03/28/2016  Decreased Interest 0 0 0 0 0  Down, Depressed, Hopeless 0 0 0 0 0  PHQ - 2 Score 0 0 0 0 0  Altered sleeping 0 - - - -  Tired, decreased energy 0 - - - -  Change in appetite 0 - - - -  Feeling bad or failure about yourself  0 - - - -  Trouble concentrating 0 - - - -  Moving slowly or fidgety/restless 0 - - - -  Suicidal thoughts 0 - - - -  PHQ-9 Score 0 - - - -    phq 9 is negative   Fall Risk: Fall Risk  07/30/2018 03/29/2018 11/25/2017 06/19/2017 02/18/2017  Falls in the past year? 0 0 No No No  Number falls in past yr: 0 0 - - -  Injury with Fall? 0 0 - - -  Risk for fall due to : - - - - -  Follow up - - - - -     Functional Status Survey: Is the patient deaf or have difficulty hearing?: No Does the patient have difficulty seeing, even when wearing glasses/contacts?: No Does the patient have difficulty concentrating, remembering, or making decisions?: No Does the patient have difficulty walking or climbing stairs?: No Does the patient have difficulty dressing or bathing?: No Does the patient have difficulty doing errands alone such as visiting a doctor's office or shopping?: No    Assessment & Plan  1. Controlled type 2 diabetes mellitus with microalbuminuria, without long-term current use of insulin (HCC)  - Hemoglobin A1c - glipiZIDE (GLUCOTROL XL)  5 MG 24 hr tablet; Take 1 tablet (5 mg total) by mouth daily with breakfast.  Dispense: 90 tablet; Refill: 0 - metFORMIN (GLUCOPHAGE-XR) 500 MG 24 hr tablet; Take 2 tablets (1,000 mg total) by mouth daily with breakfast.  Dispense: 180 tablet; Refill: 0  2. Controlled gout  - allopurinol (ZYLOPRIM) 300 MG tablet; Take 1 tablet (300 mg total) by mouth daily.  Dispense: 90  tablet; Refill: 1  3. Essential hypertension  - amLODipine (NORVASC) 2.5 MG tablet; Take 1 tablet (2.5 mg total) by mouth daily.  Dispense: 90 tablet; Refill: 0  4. Mixed hyperlipidemia  - atorvastatin (LIPITOR) 40 MG tablet; Take 1 tablet (40 mg total) by mouth daily.  Dispense: 90 tablet; Refill: 0  5. 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  Dispense: 90 tablet; Refill: 0 - sildenafil (VIAGRA) 100 MG tablet; Take 0.5-1 tablets (50-100 mg total) by mouth daily as needed for erectile dysfunction.  Dispense: 30 tablet; Refill: 2 - metFORMIN (GLUCOPHAGE-XR) 500 MG 24 hr tablet; Take 2 tablets (1,000 mg total) by mouth daily with breakfast.  Dispense: 180 tablet; Refill: 0  6. Aortic valve stenosis, mild   7. Obesity, Class III, BMI 40-49.9 (morbid obesity) ([place])  Discussed with the patient the risk posed by an increased BMI. Discussed importance of portion control, calorie counting and at least 150 minutes of physical activity weekly. Avoid sweet beverages and drink more water. Eat at least 6 servings of fruit and vegetables daily   8. Simple chronic bronchitis (HCC)  We will monitor for now

## 2018-07-31 LAB — HEMOGLOBIN A1C
Hgb A1c MFr Bld: 7.6 % of total Hgb — ABNORMAL HIGH (ref <5.7)
Mean Plasma Glucose: 171 (calc)
eAG (mmol/L): 9.5 (calc)

## 2018-08-03 DIAGNOSIS — M25561 Pain in right knee: Secondary | ICD-10-CM | POA: Diagnosis not present

## 2018-08-03 DIAGNOSIS — I10 Essential (primary) hypertension: Secondary | ICD-10-CM | POA: Diagnosis not present

## 2018-08-03 DIAGNOSIS — M25562 Pain in left knee: Secondary | ICD-10-CM | POA: Diagnosis not present

## 2018-08-03 DIAGNOSIS — E785 Hyperlipidemia, unspecified: Secondary | ICD-10-CM | POA: Diagnosis not present

## 2018-08-03 DIAGNOSIS — H409 Unspecified glaucoma: Secondary | ICD-10-CM | POA: Diagnosis not present

## 2018-08-03 DIAGNOSIS — E119 Type 2 diabetes mellitus without complications: Secondary | ICD-10-CM | POA: Diagnosis not present

## 2018-08-03 DIAGNOSIS — M109 Gout, unspecified: Secondary | ICD-10-CM | POA: Diagnosis not present

## 2018-08-03 DIAGNOSIS — M545 Low back pain: Secondary | ICD-10-CM | POA: Diagnosis not present

## 2018-08-10 ENCOUNTER — Encounter: Payer: Self-pay | Admitting: Physician Assistant

## 2018-08-10 ENCOUNTER — Telehealth: Payer: Self-pay

## 2018-08-10 NOTE — Telephone Encounter (Deleted)
.  [email or web address]

## 2018-08-10 NOTE — Progress Notes (Signed)
Cardiology Office Note Date:  08/11/2018  Patient ID:  Jordan [name] 1940/06/17, MRN [id number] PCP:  No primary care provider on file.  Cardiologist:  Dr. Rockey Situ, MD    Chief Complaint: Follow up  History of Present Illness: Jordan [name] is a 78 y.o. male with history of aortic valve stenosis, DM2, HTN, HLD, ACEi/ARB induced angioedema, morbid obesity, and prior tobacco abuse who presents for follow up of his aortic valve disease.   Prior echo from 2014 showed normal EF with mild aortic stenosis. He was last seen in 02/2017 for routine follow up. He was asymptomatic at that time. Follow up echo in 03/2017 showed an EF of 55-60%, no RWMA, Gr1DD, mild aortic stenosis with a mean gradient of 21 mmHg and a valve area of 1.47, mildly dilated left atrium, RVSF normal, PASP normal.   Labs: 07/2018 - A1c 7.6 11/2017 - LDL 77, SCr 1.04, K+ 4.5, LFT normal, HGB 14.1  He comes in doing well today from a cardiac perspective. No chest pain, SOB, dizziness, presyncope, or syncope. He walks 30 minutes 5 days per week without issues. His weight is down 14 pounds compared to his last office visit with [name] in 02/2017. No lower extremity swelling, abdominal distension, orthopnea, PND or early satiety. He does not check his BP at home. He has not yet taken his amlodipine. His only concern today is right foot arch pain. He has an appointment scheduled with a podiatrist for this.    Past Medical History:  Diagnosis Date  . Aortic stenosis   . Chronic kidney disease, stage II (mild)   . Decreased libido   . Diabetes mellitus without complication ([place])   . Glaucoma   . Gout 2009  . Hernia 2025,4270  . Hyperlipidemia   . Hypertension   . Inguinal hernia without mention of obstruction or gangrene, unilateral or unspecified, (not specified as recurrent)   . Lumbago   . Obesity   . Unspecified constipation     Past Surgical History:  Procedure Laterality Date  . cataract surgery    .  COLONOSCOPY  2012  . EYE SURGERY  2011   cataract  . UMBILICAL HERNIA REPAIR  2011    Current Meds  Medication Sig  . ACCU-CHEK FASTCLIX LANCETS MISC 1 each by Subdermal route 2 (two) times daily.  Marland Kitchen allopurinol (ZYLOPRIM) 300 MG tablet Take 1 tablet (300 mg total) by mouth daily.  Marland Kitchen amLODipine (NORVASC) 2.5 MG tablet Take 1 tablet (2.5 mg total) by mouth daily.  Marland Kitchen ammonium lactate (AMLACTIN) 12 % cream Apply topically as needed for dry skin.  Marland Kitchen aspirin 81 MG tablet Take 162 mg by mouth daily.  Marland Kitchen atorvastatin (LIPITOR) 40 MG tablet Take 1 tablet (40 mg total) by mouth daily.  . Blood Glucose Monitoring Suppl (ACCU-CHEK AVIVA PLUS) w/Device KIT 1 each by Does not apply route 2 (two) times daily. Use as directed  . glipiZIDE (GLUCOTROL XL) 5 MG 24 hr tablet Take 1 tablet (5 mg total) by mouth daily with breakfast.  . glucose blood test strip CHECK TWICE DAILY FOR DIABETES  . latanoprost (XALATAN) 0.005 % ophthalmic solution Place 1 drop into the left eye at bedtime.   . metFORMIN (GLUCOPHAGE-XR) 500 MG 24 hr tablet Take 2 tablets (1,000 mg total) by mouth daily with breakfast.  . niacin 100 MG tablet Take 100 mg by mouth at bedtime.  . Omega-3 Fatty Acids (FISH OIL PO) Take 1 capsule by mouth 2 (  two) times daily.   . polyethylene glycol (MIRALAX / GLYCOLAX) packet Take 17 g by mouth daily as needed.  . sildenafil (VIAGRA) 100 MG tablet Take 0.5-1 tablets (50-100 mg total) by mouth daily as needed for erectile dysfunction.    Allergies:   Ace inhibitors   Social History:  The patient  reports that he quit smoking about 42 years ago. His smoking use included cigarettes. He started smoking about 53 years ago. He has a 10.00 pack-year smoking history. He has never used smokeless tobacco. He reports current alcohol use of about 1.0 standard drinks of alcohol per week. He reports that he does not use drugs.   Family History:  The patient's family history includes Diabetes in his brother, sister,  and sister; Heart disease in his father; Hypertension in his father; Prostate cancer in his brother.  ROS:   Review of Systems  Constitutional: Negative for chills, diaphoresis, fever, malaise/fatigue and weight loss.  HENT: Negative for congestion.   Eyes: Negative for discharge and redness.  Respiratory: Negative for cough, hemoptysis, sputum production, shortness of breath and wheezing.   Cardiovascular: Negative for chest pain, palpitations, orthopnea, claudication, leg swelling and PND.  Gastrointestinal: Negative for abdominal pain, blood in stool, heartburn, melena, nausea and vomiting.  Genitourinary: Negative for hematuria.  Musculoskeletal: Positive for joint pain. Negative for falls and myalgias.       Right foot arch pain  Skin: Negative for rash.  Neurological: Negative for dizziness, tingling, tremors, sensory change, speech change, focal weakness, loss of consciousness and weakness.  Endo/Heme/Allergies: Does not bruise/bleed easily.  Psychiatric/Behavioral: Negative for substance abuse. The patient is not nervous/anxious.   All other systems reviewed and are negative.    PHYSICAL EXAM:  VS:  BP 140/88 (BP Location: Left Arm, Patient Position: Sitting, Cuff Size: Large)   Pulse (!) 117   Ht '5\' 9"'  (1.753 m)   Wt 286 lb (129.7 kg)   BMI 42.23 kg/m  BMI: Body mass index is 42.23 kg/m.  Physical Exam  Constitutional: He is oriented to person, place, and time. He appears well-developed and well-nourished.  HENT:  Head: Normocephalic and atraumatic.  Eyes: Right eye exhibits no discharge. Left eye exhibits no discharge.  Neck: Normal range of motion. No JVD present.  Cardiovascular: Normal rate, regular rhythm, S1 normal and S2 normal. Exam reveals no distant heart sounds, no friction rub, no midsystolic click and no opening snap.  Murmur heard.  Harsh midsystolic murmur is present with a grade of 1/6 at the upper right sternal border radiating to the neck. Pulses:       Posterior tibial pulses are 2+ on the right side and 2+ on the left side.  Heart rate 90s bpm on exam.   Pulmonary/Chest: Effort normal and breath sounds normal. No respiratory distress. He has no decreased breath sounds. He has no wheezes. He has no rales. He exhibits no tenderness.  Abdominal: Soft. He exhibits no distension. There is no abdominal tenderness.  Musculoskeletal:        General: No edema.  Neurological: He is alert and oriented to person, place, and time.  Skin: Skin is warm and dry. No cyanosis. Nails show no clubbing.  Psychiatric: He has a normal mood and affect. His speech is normal and behavior is normal. Judgment and thought content normal.     EKG:  Was ordered and interpreted by me today. Shows sinus tachycardia, 117 bpm, RBBB (grossly unchanged from prior)  Recent Labs: 11/25/2017: ALT 12;  BUN 20; Creat 1.04; Hemoglobin 14.1; Platelets 223; Potassium 4.5; Sodium 136  11/25/2017: Cholesterol 143; HDL 47; LDL Cholesterol (Calc) 77; Total CHOL/HDL Ratio 3.0; Triglycerides 100   CrCl cannot be calculated (Patient's most recent lab result is older than the maximum 21 days allowed.).   Wt Readings from Last 3 Encounters:  08/11/18 286 lb (129.7 kg)  07/30/18 296 lb 11.2 oz (134.6 kg)  03/29/18 292 lb 4.8 oz (132.6 kg)     Other studies reviewed: Additional studies/records reviewed today include: summarized above  ASSESSMENT AND PLAN:  1. Aortic valve stenosis: Mild by echo in 03/2017 with a mean gradient of 21 mmHg and a valve area of 1.47. Asymptomatic. Schedule echo. Could decrease ASA to 81 mg daily.   2. HTN: Blood pressure is suboptimally controlled today. BP was also elevated with PCP on 07/30/2018 at 140/86. He has not yet taken his medication this morning. He does not recall if he had taken his medication prior to his visit with his PCP. Increase amlodipine to 5 mg daily.  3. HLD: LDL of 77 from 11/2017. Continue Lipitor. Has declined transition to Crestor.  Consider stopping niacin.   4. DM2: Per PCP.   5. Obesity: Weight loss advised.   Disposition: F/u with Dr. Rockey Situ or an APP in 12 months.  Current medicines are reviewed at length with the patient today.  The patient did not have any concerns regarding medicines.  Signed, Christell Faith, PA-C 08/11/2018 12:13 PM     [address] [place], [place] [address] [phone]

## 2018-08-10 NOTE — Telephone Encounter (Addendum)

## 2018-08-11 ENCOUNTER — Encounter: Payer: Self-pay | Admitting: Physician Assistant

## 2018-08-11 ENCOUNTER — Ambulatory Visit: Payer: Medicare PPO | Admitting: Physician Assistant

## 2018-08-11 ENCOUNTER — Other Ambulatory Visit: Payer: Self-pay

## 2018-08-11 VITALS — BP 140/88 | HR 117 | Ht 69.0 in | Wt 286.0 lb

## 2018-08-11 DIAGNOSIS — I350 Nonrheumatic aortic (valve) stenosis: Secondary | ICD-10-CM | POA: Diagnosis not present

## 2018-08-11 DIAGNOSIS — Z6841 Body Mass Index (BMI) 40.0 and over, adult: Secondary | ICD-10-CM | POA: Diagnosis not present

## 2018-08-11 DIAGNOSIS — I10 Essential (primary) hypertension: Secondary | ICD-10-CM | POA: Diagnosis not present

## 2018-08-11 DIAGNOSIS — E782 Mixed hyperlipidemia: Secondary | ICD-10-CM | POA: Diagnosis not present

## 2018-08-11 MED ORDER — AMLODIPINE BESYLATE 5 MG PO TABS: 5.0000 mg | ORAL_TABLET | Freq: Every day | ORAL | 3 refills | Status: DC

## 2018-08-11 NOTE — Patient Instructions (Signed)
Medication Instructions:  Your physician has recommended you make the following change in your medication:  1- INCREASE Amlodipine to Take 1 tablet (5 mg total) by mouth daily. If you need a refill on your cardiac medications before your next appointment, please call your pharmacy.   Lab work: No labs ordered  If you have labs (blood work) drawn today and your tests are completely normal, you will receive your results only by: Marland Kitchen MyChart Message (if you have MyChart) OR . A paper copy in the mail If you have any lab test that is abnormal or we need to change your treatment, we will call you to review the results.  Testing/Procedures: 1- Echo  Please return to [facility] on ______________ at _______________ AM/PM for an Echocardiogram. Your physician has requested that you have an echocardiogram. Echocardiography is a painless test that uses sound waves to create images of your heart. It provides your doctor with information about the size and shape of your heart and how well your heart's chambers and valves are working. This procedure takes approximately one hour. There are no restrictions for this procedure. Please note; depending on visual quality an IV may need to be placed.    Follow-Up: At [facility], you and your health needs are our priority.  As part of our continuing mission to provide you with exceptional heart care, we have created designated Provider Care Teams.  These Care Teams include your primary Cardiologist (physician) and Advanced Practice Providers (APPs -  Physician Assistants and Nurse Practitioners) who all work together to provide you with the care you need, when you need it. You will need a follow up appointment in 12 months.  Please call our office 2 months in advance to schedule this appointment.  Please see Ida Rogue, MD.

## 2018-08-12 ENCOUNTER — Ambulatory Visit: Payer: Medicare PPO | Admitting: Podiatry

## 2018-08-12 ENCOUNTER — Encounter: Payer: Self-pay | Admitting: Podiatry

## 2018-08-12 ENCOUNTER — Other Ambulatory Visit: Payer: Self-pay

## 2018-08-12 VITALS — Temp 97.3°F

## 2018-08-12 DIAGNOSIS — M722 Plantar fascial fibromatosis: Secondary | ICD-10-CM | POA: Diagnosis not present

## 2018-08-12 DIAGNOSIS — B351 Tinea unguium: Secondary | ICD-10-CM | POA: Diagnosis not present

## 2018-08-12 DIAGNOSIS — M79676 Pain in unspecified toe(s): Secondary | ICD-10-CM

## 2018-08-12 DIAGNOSIS — E1142 Type 2 diabetes mellitus with diabetic polyneuropathy: Secondary | ICD-10-CM | POA: Diagnosis not present

## 2018-08-12 DIAGNOSIS — L84 Corns and callosities: Secondary | ICD-10-CM | POA: Diagnosis not present

## 2018-08-12 NOTE — Progress Notes (Signed)
Complaint:  Visit Type: Patient returns to my office for continued preventative foot care services. Complaint: Patient states" my nails have grown long and thick and become painful to walk and wear shoes" Patient has been diagnosed with DM with no foot complications. The patient presents for preventative foot care services. No changes to ROS.  Painful corn second toe right foot. Patient has developed pain through the arch right foot.  He has soaked his foot in epsom salts.  No history of trauma.  Podiatric Exam: Vascular: dorsalis pedis and posterior tibial pulses are palpable bilateral. Capillary return is immediate. Temperature gradient is WNL. Skin turgor WNL  Sensorium: Normal Semmes Weinstein monofilament test. Normal tactile sensation bilaterally. Nail Exam: Pt has thick disfigured discolored nails with subungual debris noted bilateral entire nail hallux through fifth toenails Ulcer Exam: There is no evidence of ulcer or pre-ulcerative changes or infection. Orthopedic Exam: Muscle tone and strength are WNL. No limitations in general ROM. No crepitus or effusions noted. HAV  B/L. Hammer toes second  B/L Palpable pain mid arch right foot. Skin: No Porokeratosis. No infection or ulcers.  Corn second toe medial aspect @ PIPJ.   Diagnosis:  Onychomycosis, , Pain in right toe, pain in left toes, Corn/callus,  Plantar fasciitis.  Treatment & Plan Procedures and Treatment: Consent by patient was obtained for treatment procedures. The patient understood the discussion of treatment and procedures well. All questions were answered thoroughly reviewed. Debridement of mycotic and hypertrophic toenails, 1 through 5 bilateral and clearing of subungual debris. No ulceration, no infection noted.  Powerstep insoles were given to patient.  Told him if pain persists to call on Monday and have unna boot applied.  Return Visit-Office Procedure: Patient instructed to return to the office for a follow up visit 3  months for continued evaluation and treatment.    Gardiner Barefoot DPM

## 2018-08-23 ENCOUNTER — Ambulatory Visit: Payer: Medicare PPO | Admitting: Podiatry

## 2018-08-23 DIAGNOSIS — H401122 Primary open-angle glaucoma, left eye, moderate stage: Secondary | ICD-10-CM | POA: Diagnosis not present

## 2018-09-01 ENCOUNTER — Telehealth: Payer: Self-pay | Admitting: Physician Assistant

## 2018-09-01 NOTE — Telephone Encounter (Signed)

## 2018-09-02 ENCOUNTER — Other Ambulatory Visit: Payer: Self-pay

## 2018-09-02 ENCOUNTER — Ambulatory Visit (INDEPENDENT_AMBULATORY_CARE_PROVIDER_SITE_OTHER): Payer: Medicare PPO

## 2018-09-02 DIAGNOSIS — I350 Nonrheumatic aortic (valve) stenosis: Secondary | ICD-10-CM

## 2018-09-08 ENCOUNTER — Other Ambulatory Visit: Payer: Self-pay | Admitting: Family Medicine

## 2018-09-09 ENCOUNTER — Telehealth: Payer: Self-pay | Admitting: *Deleted

## 2018-09-09 DIAGNOSIS — I350 Nonrheumatic aortic (valve) stenosis: Secondary | ICD-10-CM

## 2018-09-09 DIAGNOSIS — I77819 Aortic ectasia, unspecified site: Secondary | ICD-10-CM

## 2018-09-09 DIAGNOSIS — Z01810 Encounter for preprocedural cardiovascular examination: Secondary | ICD-10-CM

## 2018-09-09 NOTE — Telephone Encounter (Signed)
No answer. Left message to call back.   CT aorta test entered. Patient may also need BMET.

## 2018-09-09 NOTE — Telephone Encounter (Signed)
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  -Compared to echo from 2019, pump function remains normal.  His stiffened heart is unchanged.  His aortic valve has progressed to moderate narrowing (prior echo from 2019 demonstrated a trileaflet aortic valve). -Given progression of aortic valve stenosis from mild to moderate and now documented dilated aortic root, please schedule patient for CTA aorta. -Follow-up echocardiogram 12 months. -Please change patient's follow-up from 12 months to 3 to 4 months with primary cardiologist to discuss the above once it has been completed.

## 2018-09-10 ENCOUNTER — Encounter: Payer: Self-pay | Admitting: *Deleted

## 2018-09-10 ENCOUNTER — Other Ambulatory Visit
Admission: RE | Admit: 2018-09-10 | Discharge: 2018-09-10 | Disposition: A | Discharge status: Home / Self Care | Payer: Medicare PPO | Source: Ambulatory Visit | Attending: Physician Assistant | Admitting: Physician Assistant

## 2018-09-10 DIAGNOSIS — Z01810 Encounter for preprocedural cardiovascular examination: Secondary | ICD-10-CM | POA: Diagnosis not present

## 2018-09-10 DIAGNOSIS — I77819 Aortic ectasia, unspecified site: Secondary | ICD-10-CM | POA: Diagnosis not present

## 2018-09-10 DIAGNOSIS — I350 Nonrheumatic aortic (valve) stenosis: Secondary | ICD-10-CM | POA: Diagnosis not present

## 2018-09-10 LAB — BASIC METABOLIC PANEL
Anion gap: 9 (ref 5–15)
BUN: 19 mg/dL (ref 8–23)
CO2: 23 mmol/L (ref 22–32)
Calcium: 9.3 mg/dL (ref 8.9–10.3)
Chloride: 103 mmol/L (ref 98–111)
Creatinine, Ser: 0.95 mg/dL (ref 0.61–1.24)
GFR calc Af Amer: >60 mL/min (ref >60)
GFR calc non Af Amer: >60 mL/min (ref >60)
Glucose, Bld: 142 mg/dL — ABNORMAL HIGH (ref 70–99)
Potassium: 4.7 mmol/L (ref 3.5–5.1)
Sodium: 135 mmol/L (ref 135–145)

## 2018-09-10 NOTE — Telephone Encounter (Signed)
Patient verbalized understanding of results and plan of care. He is agreeable to CTA aorta.  Called scheduling and he is scheduled for 09/21/18 at 1 pm at [facility]. Arrival Time of 12:45. Nothing to eat, except clear liquids, 4 hours prior.   Called patient and he verbalized the above instructions. He is also aware to hold his metformin and glipizide the morning of the procedure. He is aware to go to the [facility] today or first of next week for lab work as well.   Letter with instructions mailed to patient.

## 2018-09-21 ENCOUNTER — Other Ambulatory Visit: Payer: Self-pay

## 2018-09-21 ENCOUNTER — Ambulatory Visit
Admission: RE | Admit: 2018-09-21 | Discharge: 2018-09-21 | Disposition: A | Discharge status: Home / Self Care | Payer: Medicare PPO | Source: Ambulatory Visit | Attending: Physician Assistant | Admitting: Physician Assistant

## 2018-09-21 DIAGNOSIS — I77819 Aortic ectasia, unspecified site: Secondary | ICD-10-CM | POA: Insufficient documentation

## 2018-09-21 DIAGNOSIS — I350 Nonrheumatic aortic (valve) stenosis: Secondary | ICD-10-CM

## 2018-09-21 DIAGNOSIS — I712 Thoracic aortic aneurysm, without rupture: Secondary | ICD-10-CM | POA: Diagnosis not present

## 2018-09-21 MED ORDER — IOHEXOL 350 MG/ML SOLN
100.0000 mL | Freq: Once | INTRAVENOUS | Status: AC | PRN
  Administered 2018-09-21: 100 mL via INTRAVENOUS

## 2018-09-22 ENCOUNTER — Telehealth: Payer: Self-pay

## 2018-09-22 NOTE — Telephone Encounter (Signed)
-----   Message from Rise Mu, PA-C sent at 09/21/2018  4:55 PM EDT ----- CTA aorta demonstrated normal in caliber aortic root and slight dilatation of the descending thoracic aorta without aneurysm.  No evidence of aortic dissection.  The aortic valve was thickened and calcified.  There were calcifications noted along the thoracic aorta as well as the coronary arteries including LAD, LCx, and RCA.  Incidentally, tiny 3 mm peripheral right lower lobe pulmonary nodule was noted.  Suspected renal cysts are also incidentally noted.  Recommendations: -Regarding the incidentally noted pulmonary nodule, radiology has recommended noncontrast CT to be performed in 09/2019.  For convenience, I am happy to order this if the patient would like.  Otherwise, he can follow-up with PCP for this. -Incidentally, he was found to have likely renal cysts without further imaging recommendation. -Overall, reassuring study with regards to the aortic root. -With regards to his aortic and coronary artery calcifications continue current dose of Lipitor with recommended goal LDL less than 70. -Follow-up with primary cardiologist as previously directed.

## 2018-09-22 NOTE — Telephone Encounter (Signed)
Pt made aware of the CTA results and Christell Faith. PA recommendation. Pt verbalized understanding and will f/u with his pcp regarding recommended f/u testing. Results fwd to the pt pcp.

## 2018-10-06 ENCOUNTER — Telehealth: Payer: Self-pay | Admitting: Family Medicine

## 2018-10-06 DIAGNOSIS — L309 Dermatitis, unspecified: Secondary | ICD-10-CM

## 2018-10-06 MED ORDER — AMMONIUM LACTATE 12 % EX CREA: TOPICAL_CREAM | CUTANEOUS | 1 refills | Status: DC | PRN

## 2018-10-06 NOTE — Telephone Encounter (Signed)
Relation to pt: self  Call back [place]: [place], [place] [phone] (Phone) [phone] (Fax)     Reason for call:  Patient requesting a refill ammonium lactate (AMLACTIN) 12 % cream but unsure if this is the medication needed. Patient states mail order has made several attempts to contact PCP (chart doesn't reflect) patient states PCP would know if he's out of cream, please advise

## 2018-10-07 ENCOUNTER — Other Ambulatory Visit: Payer: Self-pay | Admitting: Family Medicine

## 2018-10-07 DIAGNOSIS — E1140 Type 2 diabetes mellitus with diabetic neuropathy, unspecified: Secondary | ICD-10-CM

## 2018-10-07 DIAGNOSIS — N521 Erectile dysfunction due to diseases classified elsewhere: Secondary | ICD-10-CM

## 2018-11-03 ENCOUNTER — Other Ambulatory Visit: Payer: Self-pay | Admitting: Family Medicine

## 2018-11-03 DIAGNOSIS — E782 Mixed hyperlipidemia: Secondary | ICD-10-CM

## 2018-11-03 NOTE — Telephone Encounter (Signed)
{   Requested Prescriptions  Pending Prescriptions Disp Refills  . atorvastatin (LIPITOR) 40 MG tablet [Pharmacy Med Name: ATORVASTATIN CALCIUM 40 MG Tablet] 90 tablet 0    Sig: TAKE 1 TABLET EVERY DAY     Cardiovascular:  Antilipid - Statins Passed - 11/03/2018  3:16 PM      Passed - Total Cholesterol in normal range and within 360 days    Cholesterol, Total  Date Value Ref Range Status  08/27/2015 131 100 - 199 mg/dL Final   Cholesterol  Date Value Ref Range Status  11/25/2017 143 <200 mg/dL Final         Passed - LDL in normal range and within 360 days    LDL Cholesterol (Calc)  Date Value Ref Range Status  11/25/2017 77 mg/dL (calc) Final    Comment:    Reference range: <100 . Desirable range <100 mg/dL for primary prevention;   <70 mg/dL for patients with CHD or diabetic patients  with > or = 2 CHD risk factors. Marland Kitchen LDL-C is now calculated using the Martin-Hopkins  calculation, which is a validated novel method providing  better accuracy than the Friedewald equation in the  estimation of LDL-C.  Cresenciano Genre et al. Annamaria Helling. 9211;941(74): 2061-2068  (http://education.[employer].com/faq/FAQ164)          Passed - HDL in normal range and within 360 days    HDL  Date Value Ref Range Status  11/25/2017 47 >40 mg/dL Final  08/27/2015 40 >39 mg/dL Final         Passed - Triglycerides in normal range and within 360 days    Triglycerides  Date Value Ref Range Status  11/25/2017 100 <150 mg/dL Final         Passed - Patient is not pregnant      Passed - Valid encounter within last 12 months    Recent Outpatient Visits          3 months ago Controlled type 2 diabetes mellitus with microalbuminuria, without long-term current use of insulin [facility])   [facility] [place], Drue Stager, MD   7 months ago Controlled type 2 diabetes mellitus with microalbuminuria, without long-term current use of insulin [facility])   [facility] [place], Drue Stager, MD    11 months ago 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

## 2018-11-10 ENCOUNTER — Other Ambulatory Visit: Payer: Self-pay | Admitting: Family Medicine

## 2018-11-10 DIAGNOSIS — N521 Erectile dysfunction due to diseases classified elsewhere: Secondary | ICD-10-CM

## 2018-11-10 DIAGNOSIS — E1129 Type 2 diabetes mellitus with other diabetic kidney complication: Secondary | ICD-10-CM

## 2018-11-10 DIAGNOSIS — E1140 Type 2 diabetes mellitus with diabetic neuropathy, unspecified: Secondary | ICD-10-CM

## 2018-11-11 ENCOUNTER — Ambulatory Visit: Payer: Medicare PPO

## 2018-11-12 ENCOUNTER — Ambulatory Visit: Payer: Medicare PPO

## 2018-11-18 ENCOUNTER — Encounter: Payer: Self-pay | Admitting: Podiatry

## 2018-11-18 ENCOUNTER — Ambulatory Visit: Payer: Medicare PPO | Admitting: Podiatry

## 2018-11-18 ENCOUNTER — Other Ambulatory Visit: Payer: Self-pay

## 2018-11-18 DIAGNOSIS — B351 Tinea unguium: Secondary | ICD-10-CM | POA: Diagnosis not present

## 2018-11-18 DIAGNOSIS — L84 Corns and callosities: Secondary | ICD-10-CM

## 2018-11-18 DIAGNOSIS — M79676 Pain in unspecified toe(s): Secondary | ICD-10-CM

## 2018-11-18 DIAGNOSIS — E1142 Type 2 diabetes mellitus with diabetic polyneuropathy: Secondary | ICD-10-CM | POA: Diagnosis not present

## 2018-11-18 NOTE — Progress Notes (Signed)
Complaint:  Visit Type: Patient returns to my office for continued preventative foot care services. Complaint: Patient states" my nails have grown long and thick and become painful to walk and wear shoes" Patient has been diagnosed with DM with no foot complications. The patient presents for preventative foot care services. No changes to ROS.  Painful corn second toe right foot.  Podiatric Exam: Vascular: dorsalis pedis and posterior tibial pulses are palpable bilateral. Capillary return is immediate. Temperature gradient is WNL. Skin turgor WNL  Sensorium: Normal Semmes Weinstein monofilament test. Normal tactile sensation bilaterally. Nail Exam: Pt has thick disfigured discolored nails with subungual debris noted bilateral entire nail hallux through fifth toenails Ulcer Exam: There is no evidence of ulcer or pre-ulcerative changes or infection. Orthopedic Exam: Muscle tone and strength are WNL. No limitations in general ROM. No crepitus or effusions noted. HAV  B/L. Hammer toes second  B/L Skin: No Porokeratosis. No infection or ulcers.  Corn second toe medial aspect @ PIPJ.   Diagnosis:  Onychomycosis, , Pain in right toe, pain in left toes, Corn/callus  Treatment & Plan Procedures and Treatment: Consent by patient was obtained for treatment procedures. The patient understood the discussion of treatment and procedures well. All questions were answered thoroughly reviewed. Debridement of mycotic and hypertrophic toenails, 1 through 5 bilateral and clearing of subungual debris. No ulceration, no infection noted.    Return Visit-Office Procedure: Patient instructed to return to the office for a follow up visit 3 months for continued evaluation and treatment.    Gardiner Barefoot DPM

## 2018-11-23 NOTE — Progress Notes (Deleted)
11/24/2018 11:06 AM   Jordan [name] 1941/03/01 [id number]  Referring provider: Steele Sizer, MD [address] [place],  [place] [address]  No chief complaint on file.   HPI: Patient is a 78 year old male with a history of phimosis and BPH with LU TS who presents today for follow-up.     Phimosis Patient still has difficulty retracting the foreskin.  He is still able to urinate without issue.  He is not interested in a dorsal slit or circumcision at this time.     BPH WITH LUTS  (prostate and/or bladder) His IPSS score today is ***, which is *** lower urinary tract symptomatology. He is *** with his quality life due to his urinary symptoms.  His previous IPSS score was 7/2.  His previous PVR is 29 mL.  He has nocturia x2.  He states he was tested for sleep apnea, but he was found not to have it.  Patient denies any gross hematuria, dysuria or suprapubic/flank pain.  Patient denies any fevers, chills, nausea or vomiting.    His brother has a history of non fatal prostate cancer.    Score:  1-7 Mild 8-19 Moderate 20-35 Severe     PMH: Past Medical History:  Diagnosis Date  . Aortic stenosis   . Chronic kidney disease, stage II (mild)   . Decreased libido   . Diabetes mellitus without complication ([place])   . Glaucoma   . Gout 2009  . Hernia 1779,3903  . Hyperlipidemia   . Hypertension   . Inguinal hernia without mention of obstruction or gangrene, unilateral or unspecified, (not specified as recurrent)   . Lumbago   . Obesity   . Unspecified constipation     Surgical History: Past Surgical History:  Procedure Laterality Date  . cataract surgery    . COLONOSCOPY  2012  . EYE SURGERY  2011   cataract  . UMBILICAL HERNIA REPAIR  2011    Home Medications:  Allergies as of 11/24/2018      Reactions   Ace Inhibitors Other (See Comments)   angioedema      Medication List       Accurate as of November 23, 2018 11:06 AM. If you have any  questions, ask your nurse or doctor.        Accu-Chek Aviva Plus test strip Generic drug: glucose blood CHECK TWICE DAILY FOR DIABETES   Accu-Chek Aviva Plus w/Device Kit 1 each by Does not apply route 2 (two) times daily. Use as directed   Accu-Chek FastClix Lancets Misc 1 each by Subdermal route 2 (two) times daily.   allopurinol 300 MG tablet Commonly known as: ZYLOPRIM Take 1 tablet (300 mg total) by mouth daily.   amLODipine 5 MG tablet Commonly known as: NORVASC Take 1 tablet (5 mg total) by mouth daily.   ammonium lactate 12 % cream Commonly known as: AMLACTIN Apply topically as needed for dry skin.   aspirin 81 MG tablet Take 162 mg by mouth daily.   atorvastatin 40 MG tablet Commonly known as: LIPITOR TAKE 1 TABLET EVERY DAY   FISH OIL PO Take 1 capsule by mouth 2 (two) times daily.   glipiZIDE 5 MG 24 hr tablet Commonly known as: GLUCOTROL XL TAKE 1 TABLET (5 MG TOTAL) BY MOUTH DAILY WITH BREAKFAST.   latanoprost 0.005 % ophthalmic solution Commonly known as: XALATAN Place 1 drop into the left eye at bedtime.   metFORMIN 500 MG 24 hr tablet  Commonly known as: GLUCOPHAGE-XR TAKE 2 TABLETS (1,000 MG TOTAL) BY MOUTH DAILY WITH BREAKFAST.   niacin 100 MG tablet Take 100 mg by mouth at bedtime.   polyethylene glycol 17 g packet Commonly known as: MIRALAX / GLYCOLAX Take 17 g by mouth daily as needed.   sildenafil 100 MG tablet Commonly known as: Viagra Take 0.5-1 tablets (50-100 mg total) by mouth daily as needed for erectile dysfunction.       Allergies:  Allergies  Allergen Reactions  . Ace Inhibitors Other (See Comments)    angioedema    Family History: Family History  Problem Relation Age of Onset  . Heart disease Father   . Hypertension Father   . Diabetes Sister   . Diabetes Brother   . Prostate cancer Brother   . Diabetes Sister   . Kidney disease Neg Hx   . Kidney cancer Neg Hx   . Bladder Cancer Neg Hx     Social  History:  reports that he quit smoking about 43 years ago. His smoking use included cigarettes. He started smoking about 53 years ago. He has a 10.00 pack-year smoking history. He has never used smokeless tobacco. He reports current alcohol use of about 1.0 standard drinks of alcohol per week. He reports that he does not use drugs.  ROS:                                        Physical Exam: There were no vitals taken for this visit.  Constitutional:  Well nourished. Alert and oriented, No acute distress. HEENT: [place] AT, moist mucus membranes.  Trachea midline, no masses. Cardiovascular: No clubbing, cyanosis, or edema. Respiratory: Normal respiratory effort, no increased work of breathing. GI: Abdomen is soft, non tender, non distended, no abdominal masses. Liver and spleen not palpable.  No hernias appreciated.  Stool sample for occult testing is not indicated.   GU: No CVA tenderness.  No bladder fullness or masses.  Patient with circumcised/uncircumcised phallus. ***Foreskin easily retracted***  Urethral meatus is patent.  No penile discharge. No penile lesions or rashes. Scrotum without lesions, cysts, rashes and/or edema.  Testicles are located scrotally bilaterally. No masses are appreciated in the testicles. Left and right epididymis are normal. Rectal: Patient with  normal sphincter tone. Anus and perineum without scarring or rashes. No rectal masses are appreciated. Prostate is approximately *** grams, *** nodules are appreciated. Seminal vesicles are normal. Skin: No rashes, bruises or suspicious lesions. Lymph: No cervical or inguinal adenopathy. Neurologic: Grossly intact, no focal deficits, moving all 4 extremities. Psychiatric: Normal mood and affect.  Laboratory Data: Lab Results  Component Value Date   WBC 6.2 11/25/2017   HGB 14.1 11/25/2017   HCT 41.9 11/25/2017   MCV 86.6 11/25/2017   PLT 223 11/25/2017    Lab Results  Component Value Date    CREATININE 0.95 09/10/2018    Lab Results  Component Value Date   HGBA1C 7.6 (H) 07/30/2018        Component Value Date/Time   CHOL 143 11/25/2017 1436   CHOL 131 08/27/2015 1158   HDL 47 11/25/2017 1436   HDL 40 08/27/2015 1158   CHOLHDL 3.0 11/25/2017 1436   VLDL 20 07/28/2016 1248   LDLCALC 77 11/25/2017 1436    Lab Results  Component Value Date   AST 21 11/25/2017   Lab Results  Component Value Date  ALT 12 11/25/2017    I have reviewed the labs  Assessment & Plan:    1. Phimosis Not bothersome at this point RTC in one year for recheck Contact office if unable to urinate  2. BPH with LUTS IPSS score is 7/3, it is improving Continue conservative management, avoiding bladder irritants and timed voiding's Most bothersome symptoms is/are nocturia RTC in 12 months for IPSS and exam   3. Nocturia Not bothersome to patient  No follow-ups on file.  These notes generated with voice recognition software. I apologize for typographical errors.  Zara Council, PA-C  [facility] [address] [place], [place] [address] [phone]

## 2018-11-24 ENCOUNTER — Encounter: Payer: Self-pay | Admitting: Urology

## 2018-11-24 ENCOUNTER — Ambulatory Visit: Payer: Medicare PPO | Admitting: Urology

## 2018-11-30 ENCOUNTER — Ambulatory Visit: Payer: Medicare PPO | Admitting: Family Medicine

## 2018-11-30 ENCOUNTER — Ambulatory Visit (INDEPENDENT_AMBULATORY_CARE_PROVIDER_SITE_OTHER): Payer: Medicare PPO

## 2018-11-30 ENCOUNTER — Other Ambulatory Visit: Payer: Self-pay

## 2018-11-30 ENCOUNTER — Encounter: Payer: Self-pay | Admitting: Family Medicine

## 2018-11-30 VITALS — BP 130/70 | HR 85 | Temp 98.2°F | Resp 14 | Ht 69.0 in | Wt 298.1 lb

## 2018-11-30 DIAGNOSIS — Z23 Encounter for immunization: Secondary | ICD-10-CM | POA: Diagnosis not present

## 2018-11-30 DIAGNOSIS — J410 Simple chronic bronchitis: Secondary | ICD-10-CM

## 2018-11-30 DIAGNOSIS — I10 Essential (primary) hypertension: Secondary | ICD-10-CM

## 2018-11-30 DIAGNOSIS — R809 Proteinuria, unspecified: Secondary | ICD-10-CM

## 2018-11-30 DIAGNOSIS — Z0000 Encounter for general adult medical examination without abnormal findings: Secondary | ICD-10-CM | POA: Diagnosis not present

## 2018-11-30 DIAGNOSIS — E1129 Type 2 diabetes mellitus with other diabetic kidney complication: Secondary | ICD-10-CM

## 2018-11-30 DIAGNOSIS — M109 Gout, unspecified: Secondary | ICD-10-CM

## 2018-11-30 DIAGNOSIS — N521 Erectile dysfunction due to diseases classified elsewhere: Secondary | ICD-10-CM

## 2018-11-30 DIAGNOSIS — E1140 Type 2 diabetes mellitus with diabetic neuropathy, unspecified: Secondary | ICD-10-CM

## 2018-11-30 DIAGNOSIS — E66813 Obesity, class 3: Secondary | ICD-10-CM

## 2018-11-30 DIAGNOSIS — F341 Dysthymic disorder: Secondary | ICD-10-CM

## 2018-11-30 DIAGNOSIS — E782 Mixed hyperlipidemia: Secondary | ICD-10-CM | POA: Diagnosis not present

## 2018-11-30 LAB — POCT GLYCOSYLATED HEMOGLOBIN (HGB A1C): Hemoglobin A1C: 7.1 % — AB (ref 4.0–5.6)

## 2018-11-30 MED ORDER — METFORMIN HCL ER 500 MG PO TB24: 1000.0000 mg | ORAL_TABLET | Freq: Every day | ORAL | 0 refills | Status: DC

## 2018-11-30 MED ORDER — ALLOPURINOL 300 MG PO TABS: 300.0000 mg | ORAL_TABLET | Freq: Every day | ORAL | 1 refills | Status: DC

## 2018-11-30 MED ORDER — GLIPIZIDE ER 5 MG PO TB24: 5.0000 mg | ORAL_TABLET | Freq: Every day | ORAL | 1 refills | Status: DC

## 2018-11-30 NOTE — Patient Instructions (Signed)
[name] , Thank you for taking time to come for your Medicare Wellness Visit. I appreciate your ongoing commitment to your health goals. Please review the following plan we discussed and let me know if I can assist you in the future.   Screening recommendations/referrals: Colonoscopy: done 10/16/10 Recommended yearly ophthalmology/optometry visit for glaucoma screening and checkup Recommended yearly dental visit for hygiene and checkup  Vaccinations: Influenza vaccine: done today Pneumococcal vaccine: done today Tdap vaccine: done 02/18/17 Shingles vaccine: Shingrix discussed. Please contact your pharmacy for coverage information.   Advanced directives: Advance directive discussed with you today. I have provided a copy for you to complete at home and have notarized. Once this is complete please bring a copy in to our office so we can scan it into your chart.  Conditions/risks identified: Recommend healthy eating and physical activity to maintain A1c  Next appointment: Please follow up in one year for your Medicare Annual Wellness visit.    Preventive Care 77 Years and Older, Male Preventive care refers to lifestyle choices and visits with your health care provider that can promote health and wellness. What does preventive care include?  A yearly physical exam. This is also called an annual well check.  Dental exams once or twice a year.  Routine eye exams. Ask your health care provider how often you should have your eyes checked.  Personal lifestyle choices, including:  Daily care of your teeth and gums.  Regular physical activity.  Eating a healthy diet.  Avoiding tobacco and drug use.  Limiting alcohol use.  Practicing safe sex.  Taking low doses of aspirin every day.  Taking vitamin and mineral supplements as recommended by your health care provider. What happens during an annual well check? The services and screenings done by your health care provider during  your annual well check will depend on your age, overall health, lifestyle risk factors, and family history of disease. Counseling  Your health care provider may ask you questions about your:  Alcohol use.  Tobacco use.  Drug use.  Emotional well-being.  Home and relationship well-being.  Sexual activity.  Eating habits.  History of falls.  Memory and ability to understand (cognition).  Work and work Statistician. Screening  You may have the following tests or measurements:  Height, weight, and BMI.  Blood pressure.  Lipid and cholesterol levels. These may be checked every 5 years, or more frequently if you are over 77 years old.  Skin check.  Lung cancer screening. You may have this screening every year starting at age 77 if you have a 30-pack-year history of smoking and currently smoke or have quit within the past 15 years.  Fecal occult blood test (FOBT) of the stool. You may have this test every year starting at age 77.  Flexible sigmoidoscopy or colonoscopy. You may have a sigmoidoscopy every 5 years or a colonoscopy every 10 years starting at age 77.  Prostate cancer screening. Recommendations will vary depending on your family history and other risks.  Hepatitis C blood test.  Hepatitis B blood test.  Sexually transmitted disease (STD) testing.  Diabetes screening. This is done by checking your blood sugar (glucose) after you have not eaten for a while (fasting). You may have this done every 1-3 years.  Abdominal aortic aneurysm (AAA) screening. You may need this if you are a current or former smoker.  Osteoporosis. You may be screened starting at age 77 if you are at high risk. Talk with your health care  provider about your test results, treatment options, and if necessary, the need for more tests. Vaccines  Your health care provider may recommend certain vaccines, such as:  Influenza vaccine. This is recommended every year.  Tetanus, diphtheria, and  acellular pertussis (Tdap, Td) vaccine. You may need a Td booster every 10 years.  Zoster vaccine. You may need this after age 77.  Pneumococcal 13-valent conjugate (PCV13) vaccine. One dose is recommended after age 77.  Pneumococcal polysaccharide (PPSV23) vaccine. One dose is recommended after age 77. Talk to your health care provider about which screenings and vaccines you need and how often you need them. This information is not intended to replace advice given to you by your health care provider. Make sure you discuss any questions you have with your health care provider. Document Released: 03/30/2015 Document Revised: 11/21/2015 Document Reviewed: 01/02/2015 Elsevier Interactive Patient Education  2017 [place] Prevention in the Home Falls can cause injuries. They can happen to people of all ages. There are many things you can do to make your home safe and to help prevent falls. What can I do on the outside of my home?  Regularly fix the edges of walkways and driveways and fix any cracks.  Remove anything that might make you trip as you walk through a door, such as a raised step or threshold.  Trim any bushes or trees on the path to your home.  Use bright outdoor lighting.  Clear any walking paths of anything that might make someone trip, such as rocks or tools.  Regularly check to see if handrails are loose or broken. Make sure that both sides of any steps have handrails.  Any raised decks and porches should have guardrails on the edges.  Have any leaves, snow, or ice cleared regularly.  Use sand or salt on walking paths during winter.  Clean up any spills in your garage right away. This includes oil or grease spills. What can I do in the bathroom?  Use night lights.  Install grab bars by the toilet and in the tub and shower. Do not use towel bars as grab bars.  Use non-skid mats or decals in the tub or shower.  If you need to sit down in the shower, use  a plastic, non-slip stool.  Keep the floor dry. Clean up any water that spills on the floor as soon as it happens.  Remove soap buildup in the tub or shower regularly.  Attach bath mats securely with double-sided non-slip rug tape.  Do not have throw rugs and other things on the floor that can make you trip. What can I do in the bedroom?  Use night lights.  Make sure that you have a light by your bed that is easy to reach.  Do not use any sheets or blankets that are too big for your bed. They should not hang down onto the floor.  Have a firm chair that has side arms. You can use this for support while you get dressed.  Do not have throw rugs and other things on the floor that can make you trip. What can I do in the kitchen?  Clean up any spills right away.  Avoid walking on wet floors.  Keep items that you use a lot in easy-to-reach places.  If you need to reach something above you, use a strong step stool that has a grab bar.  Keep electrical cords out of the way.  Do not use floor polish  or wax that makes floors slippery. If you must use wax, use non-skid floor wax.  Do not have throw rugs and other things on the floor that can make you trip. What can I do with my stairs?  Do not leave any items on the stairs.  Make sure that there are handrails on both sides of the stairs and use them. Fix handrails that are broken or loose. Make sure that handrails are as long as the stairways.  Check any carpeting to make sure that it is firmly attached to the stairs. Fix any carpet that is loose or worn.  Avoid having throw rugs at the top or bottom of the stairs. If you do have throw rugs, attach them to the floor with carpet tape.  Make sure that you have a light switch at the top of the stairs and the bottom of the stairs. If you do not have them, ask someone to add them for you. What else can I do to help prevent falls?  Wear shoes that:  Do not have high heels.  Have  rubber bottoms.  Are comfortable and fit you well.  Are closed at the toe. Do not wear sandals.  If you use a stepladder:  Make sure that it is fully opened. Do not climb a closed stepladder.  Make sure that both sides of the stepladder are locked into place.  Ask someone to hold it for you, if possible.  Clearly mark and make sure that you can see:  Any grab bars or handrails.  First and last steps.  Where the edge of each step is.  Use tools that help you move around (mobility aids) if they are needed. These include:  Canes.  Walkers.  Scooters.  Crutches.  Turn on the lights when you go into a dark area. Replace any light bulbs as soon as they burn out.  Set up your furniture so you have a clear path. Avoid moving your furniture around.  If any of your floors are uneven, fix them.  If there are any pets around you, be aware of where they are.  Review your medicines with your doctor. Some medicines can make you feel dizzy. This can increase your chance of falling. Ask your doctor what other things that you can do to help prevent falls. This information is not intended to replace advice given to you by your health care provider. Make sure you discuss any questions you have with your health care provider. Document Released: 12/28/2008 Document Revised: 08/09/2015 Document Reviewed: 04/07/2014 Elsevier Interactive Patient Education  2017 [employer].

## 2018-11-30 NOTE — Progress Notes (Signed)
Name: Jordan [name]   MRN: [id number]    DOB: 1940/12/04   Date:11/30/2018       Progress Note  Subjective  Chief Complaint  Chief Complaint  Patient presents with  . Follow-up    4 Month Follow Up    HPI  DM with microalbuminuria and ED: he is back on Glipizide 25m ER. HgbA1C used to be above 9% but he has changed his diet, walking daily taking medication as prescribed and A1C today was 7.1%  however it went up a little in May - it was 7.6% Fasting glucose in the 130's-150's . He hasa history ofmicroalbuminuria and also ED, last urine micro was back to normal and is due for repeat urine micro today . Viagra is working well.He cannot take ACE because of history of angioedema, bp is at goal. He is on Norvasc. He denies polyphagia, polydipsia but has polyuria.He denies hypoglycemic episodes   Chronic bronchitis: he used to smoke but quit many years ago, he still has a cough that is productive at times, coughs in am's about 4 times a week in am's , no wheezing or SOB. We will check spirometry when allowed, after COVID-19 pandemic   [facility], he still has nocturia 2-3 times per night . Unchanged, discussed switching from Viagra to Cialis to see if improves symptoms, but he wants to stay on viagra   HTN: he is on Norvasc and bp is at goal today, continue medications.  He denies chest pain, palpitation or decrease in exercise tolerance. He denies significant swelling with medication  Hyperlipidemia: taking lipitor and denies side effects, no myalgia.Reviewed last las and LDL was down to 77 but still not at goal, discussed changing to Crestor, he wants to continue current medication for now and we will recheck labs today .   Obesity:he is doing well,He had lost 13 lbs in 2019 however he has gained 10 lbs in the past 12 months.  He states he is still active but not going out as much and is eating more since COVID-19 He has been  walking at the mall for 30 minutes 5 days a week, also avoiding sweet beverages.   Aortic Valve stenosis:seen by RChristell Faith, PA at [facility] may 2020  ,  no syncope or chest pain, had echo normal EF. No change in medications   Patient Active Problem List   Diagnosis Date Noted  . Chronic bronchitis ([facility] 07/30/2018  . Bilateral hydrocele 11/27/2015  . Mass, scrotum 11/27/2015  . Ventral hernia without obstruction or gangrene 11/27/2015  . Controlled type 2 diabetes mellitus with microalbuminuria ([facility] 10/26/2015  . 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 Hyperlipidemia 11/07/2014  . Essential hypertension 11/07/2014  . Obesity, Class III, BMI 40-49.9 (morbid obesity) ([facility] 11/07/2014  . Gout 11/07/2014  . 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  . Epistaxis 09/21/2014  . 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 Aortic valve stenosis 12/02/2012  . Constipation 10/26/2012  . Incisional hernia, without obstruction or gangrene 10/26/2012  . Left inguinal hernia     Past Surgical History:  Procedure Laterality Date  . cataract surgery    . COLONOSCOPY  2012  . EYE SURGERY  2011   cataract  . UMBILICAL HERNIA REPAIR  2011    Family History  Problem Relation Age of Onset  . Heart disease Father   . Hypertension Father   . Diabetes Sister   .  Diabetes Brother   . Prostate cancer Brother   . Diabetes Sister   . Kidney disease Neg Hx   . Kidney cancer Neg Hx   . Bladder Cancer Neg Hx     Social History   Socioeconomic History  . Marital status: Widowed    Spouse name: Not on file  . Number of children: 2  . Years of education: Not on file  . Highest education level: Some college, no degree  Occupational History  . Occupation: retired     Comment: used to [employer]   Social Needs  . Financial resource strain: Not very hard  . Food insecurity    Worry: Never true    Inability: Never true  .  Transportation needs    Medical: No    Non-medical: No  Tobacco Use  . Smoking status: Former Smoker    Packs/day: 1.00    Years: 10.00    Pack years: 10.00    Types: Cigarettes    Start date: 03/17/1965    Quit date: 11/27/1975    Years since quitting: 43.0  . Smokeless tobacco: Never Used  . Tobacco comment: quit 40 years  Substance and Sexual Activity  . Alcohol use: Yes    Alcohol/week: 1.0 standard drinks    Types: 1 Standard drinks or equivalent per week    Comment: socially - 1 x year  . Drug use: No  . Sexual activity: Yes    Partners: Female  Lifestyle  . Physical activity    Days per week: 5 days    Minutes per session: 30 min  . Stress: Not at all  Relationships  . Social connections    Talks on phone: More than three times a week    Gets together: More than three times a week    Attends religious service: 1 to 4 times per year    Active member of club or organization: No    Attends meetings of clubs or organizations: Never    Relationship status: Widowed  . Intimate partner violence    Fear of current or ex partner: No    Emotionally abused: No    Physically abused: No    Forced sexual activity: No  Other Topics Concern  . Not on file  Social History Narrative   Lives by himself in [place]   Two grown children, daughter in [place] and son in [place]   He goes to [place] to visit his sisters often      Current Outpatient Medications:  .  ACCU-CHEK AVIVA PLUS test strip, CHECK TWICE DAILY FOR DIABETES, Disp: 200 strip, Rfl: 2 .  ACCU-CHEK FASTCLIX LANCETS MISC, 1 each by Subdermal route 2 (two) times daily., Disp: 102 each, Rfl: 2 .  allopurinol (ZYLOPRIM) 300 MG tablet, Take 1 tablet (300 mg total) by mouth daily., Disp: 90 tablet, Rfl: 1 .  amLODipine (NORVASC) 5 MG tablet, Take 1 tablet (5 mg total) by mouth daily., Disp: 90 tablet, Rfl: 3 .  ammonium lactate (AMLACTIN) 12 % cream, Apply topically as needed for dry skin., Disp: 385 g, Rfl: 1 .   aspirin 81 MG tablet, Take 162 mg by mouth daily., Disp: , Rfl:  .  atorvastatin (LIPITOR) 40 MG tablet, TAKE 1 TABLET EVERY DAY, Disp: 90 tablet, Rfl: 0 .  Blood Glucose Monitoring Suppl (ACCU-CHEK AVIVA PLUS) w/Device KIT, 1 each by Does not apply route 2 (two) times daily. Use as directed, Disp: 100 kit, Rfl: 12 .  glipiZIDE (  GLUCOTROL XL) 5 MG 24 hr tablet, Take 1 tablet (5 mg total) by mouth daily with breakfast., Disp: 90 tablet, Rfl: 1 .  latanoprost (XALATAN) 0.005 % ophthalmic solution, Place 1 drop into the left eye at bedtime. , Disp: , Rfl:  .  metFORMIN (GLUCOPHAGE-XR) 500 MG 24 hr tablet, Take 2 tablets (1,000 mg total) by mouth daily with breakfast., Disp: 180 tablet, Rfl: 0 .  niacin 100 MG tablet, Take 100 mg by mouth at bedtime., Disp: , Rfl:  .  Omega-3 Fatty Acids (FISH OIL PO), Take 1 capsule by mouth 2 (two) times daily. , Disp: , Rfl:  .  polyethylene glycol (MIRALAX / GLYCOLAX) packet, Take 17 g by mouth daily as needed., Disp: , Rfl:  .  sildenafil (VIAGRA) 100 MG tablet, Take 0.5-1 tablets (50-100 mg total) by mouth daily as needed for erectile dysfunction., Disp: 30 tablet, Rfl: 2  Allergies  Allergen Reactions  . Ace Inhibitors Other (See Comments)    angioedema    I personally reviewed active problem list, medication list, allergies, family history, social history, health maintenance with the patient/caregiver today.   ROS  Constitutional: Negative for fever, positive for  weight change.  Respiratory: Negative for cough and shortness of breath.   Cardiovascular: Negative for chest pain or palpitations.  Gastrointestinal: Negative for abdominal pain, no bowel changes.  Musculoskeletal: Negative for gait problem or joint swelling.  Skin: Negative for rash.  Neurological: Negative for dizziness or headache.  No other specific complaints in a complete review of systems (except as listed in HPI above).  Objective  Vitals:   11/30/18 1056  BP: 130/70  Pulse:  85  Resp: 14  Temp: 98.2 F (36.8 C)  SpO2: 98%  Weight: 298 lb 1.6 oz (135.2 kg)  Height: '5\' 9"'  (1.753 m)    Body mass index is 44.02 kg/m.  Physical Exam  Constitutional: Patient appears well-developed and well-nourished. Obese No distress.  HEENT: head atraumatic, normocephalic, pupils equal and reactive to light Cardiovascular: Normal rate, regular rhythm holosystolic murmur 3/6 No BLE edema. Pulmonary/Chest: Effort normal and breath sounds normal. No respiratory distress. Abdominal: Soft.  There is no tenderness. Psychiatric: Patient has a normal mood and affect. behavior is normal. Judgment and thought content normal.  Recent Results (from the past 2160 hour(s))  Basic metabolic panel     Status: Abnormal   Collection Time: 09/10/18  3:09 PM  Result Value Ref Range   Sodium 135 135 - 145 mmol/L   Potassium 4.7 3.5 - 5.1 mmol/L   Chloride 103 98 - 111 mmol/L   CO2 23 22 - 32 mmol/L   Glucose, Bld 142 (H) 70 - 99 mg/dL   BUN 19 8 - 23 mg/dL   Creatinine, Ser 0.95 0.61 - 1.24 mg/dL   Calcium 9.3 8.9 - 10.3 mg/dL   GFR calc non Af Amer >60 >60 mL/min   GFR calc Af Amer >60 >60 mL/min   Anion gap 9 5 - 15    Comment: Performed at [facility], [place], [place] [address]  POCT HgB A1C     Status: Abnormal   Collection Time: 11/30/18 11:28 AM  Result Value Ref Range   Hemoglobin A1C 7.1 (A) 4.0 - 5.6 %   HbA1c POC (<> result, manual entry)     HbA1c, POC (prediabetic range)     HbA1c, POC (controlled diabetic range)      Diabetic Foot Exam: Diabetic Foot Exam - Simple  Simple Foot Form Diabetic Foot exam was performed with the following findings: Yes 11/30/2018 11:28 AM  Visual Inspection See comments: Yes Sensation Testing Intact to touch and monofilament testing bilaterally: Yes Pulse Check Posterior Tibialis and Dorsalis pulse intact bilaterally: Yes Comments He is seeing Dr. Prudence Davidson for callus on second toe right, it is covered with  a pad       PHQ2/9: Depression screen [facility] 2/9 11/30/2018 07/30/2018 11/25/2017 08/19/2016 07/28/2016  Decreased Interest 3 0 0 0 0  Down, Depressed, Hopeless 0 0 0 0 0  PHQ - 2 Score 3 0 0 0 0  Altered sleeping 1 0 - - -  Tired, decreased energy 1 0 - - -  Change in appetite 2 0 - - -  Feeling bad or failure about yourself  2 0 - - -  Trouble concentrating 1 0 - - -  Moving slowly or fidgety/restless 0 0 - - -  Suicidal thoughts 0 0 - - -  PHQ-9 Score 10 0 - - -  Difficult doing work/chores Not difficult at all - - - -    phq 9 is positive   Fall Risk: Fall Risk  11/30/2018 07/30/2018 03/29/2018 11/25/2017 06/19/2017  Falls in the past year? 0 0 0 No No  Number falls in past yr: 0 0 0 - -  Injury with Fall? 0 0 0 - -  Risk for fall due to : - - - - -  Follow up - - - - -    Functional Status Survey: Is the patient deaf or have difficulty hearing?: Yes Does the patient have difficulty seeing, even when wearing glasses/contacts?: No Does the patient have difficulty concentrating, remembering, or making decisions?: No Does the patient have difficulty walking or climbing stairs?: No Does the patient have difficulty dressing or bathing?: No Does the patient have difficulty doing errands alone such as visiting a doctor's office or shopping?: No    Assessment & Plan  1. Type 2 diabetes mellitus with microalbuminuria, without long-term current use of insulin (HCC)  - POCT HgB A1C - Urine Microalbumin w/creat. ratio  2. Need for immunization against influenza  - Flu Vaccine QUAD 36+ mos IM  3. Essential hypertension  - COMPLETE METABOLIC PANEL WITH GFR - CBC with Differential/Platelet  4. Controlled gout  - allopurinol (ZYLOPRIM) 300 MG tablet; Take 1 tablet (300 mg total) by mouth daily.  Dispense: 90 tablet; Refill: 1  5. 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  Dispense: 90 tablet; Refill: 1 - metFORMIN (GLUCOPHAGE-XR) 500 MG 24 hr tablet; Take 2 tablets (1,000 mg total) by mouth daily with breakfast.  Dispense: 180 tablet; Refill: 0  6. Mixed hyperlipidemia  - Lipid panel  7. Simple chronic bronchitis (HCC)  Not on medication   8. Obesity, Class III, BMI 40-49.9 (morbid obesity) ([place])  Discussed with the patient the risk posed by an increased BMI. Discussed importance of portion control, calorie counting and at least 150 minutes of physical activity weekly. Avoid sweet beverages and drink more water. Eat at least 6 servings of fruit and vegetables daily   9. 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

## 2018-11-30 NOTE — Progress Notes (Signed)
Subjective:   Jordan [name] is a 78 y.o. male who presents for an Initial Medicare Annual Wellness Visit.  Review of Systems   Cardiac Risk Factors include: advanced age (>51mn, >>85women);diabetes mellitus;hypertension;dyslipidemia;male gender;obesity (BMI >30kg/m2)    Objective:    Today's Vitals   11/30/18 1122  BP: 130/70  Pulse: 85  Resp: 14  Temp: 98.2 F (36.8 C)  TempSrc: Temporal  SpO2: 98%  Weight: 298 lb 1.6 oz (135.2 kg)  Height: '5\' 9"'  (1.753 m)   Body mass index is 44.02 kg/m.  Advanced Directives 11/30/2018 11/19/2016 08/19/2016 07/28/2016 03/28/2016 11/27/2015 08/27/2015  Does Patient Have a Medical Advance Directive? No Yes No Yes Yes Yes No;Yes  Type of Advance Directive - [place] Living will Living will [place] will  Does patient want to make changes to medical advance directive? - - - - - No - Patient declined No - Patient declined  Copy of [place] Chart? - No - copy requested - No - copy requested - No - copy requested No - copy requested  Would patient like information on creating a medical advance directive? Yes (MAU/Ambulatory/Procedural Areas - Information given) - No - Patient declined - - - No - patient declined information    Current Medications (verified) Outpatient Encounter Medications as of 11/30/2018  Medication Sig  . ACCU-CHEK AVIVA PLUS test strip CHECK TWICE DAILY FOR DIABETES  . ACCU-CHEK FASTCLIX LANCETS MISC 1 each by Subdermal route 2 (two) times daily.  .Marland KitchenamLODipine (NORVASC) 5 MG tablet Take 1 tablet (5 mg total) by mouth daily.  .Marland Kitchenammonium lactate (AMLACTIN) 12 % cream Apply topically as needed for dry skin.  .Marland Kitchenaspirin 81 MG tablet Take 162 mg by mouth daily.  .Marland Kitchenatorvastatin (LIPITOR) 40 MG tablet TAKE 1 TABLET EVERY DAY  . Blood Glucose Monitoring Suppl (ACCU-CHEK AVIVA PLUS) w/Device KIT 1 each by Does not  apply route 2 (two) times daily. Use as directed  . latanoprost (XALATAN) 0.005 % ophthalmic solution Place 1 drop into the left eye at bedtime.   . niacin 100 MG tablet Take 100 mg by mouth at bedtime.  . Omega-3 Fatty Acids (FISH OIL PO) Take 1 capsule by mouth 2 (two) times daily.   . polyethylene glycol (MIRALAX / GLYCOLAX) packet Take 17 g by mouth daily as needed.  . sildenafil (VIAGRA) 100 MG tablet Take 0.5-1 tablets (50-100 mg total) by mouth daily as needed for erectile dysfunction.  . [DISCONTINUED] allopurinol (ZYLOPRIM) 300 MG tablet Take 1 tablet (300 mg total) by mouth daily.  . [DISCONTINUED] glipiZIDE (GLUCOTROL XL) 5 MG 24 hr tablet TAKE 1 TABLET (5 MG TOTAL) BY MOUTH DAILY WITH BREAKFAST.  . [DISCONTINUED] metFORMIN (GLUCOPHAGE-XR) 500 MG 24 hr tablet TAKE 2 TABLETS (1,000 MG TOTAL) BY MOUTH DAILY WITH BREAKFAST.   No facility-administered encounter medications on file as of 11/30/2018.     Allergies (verified) Ace inhibitors   History: Past Medical History:  Diagnosis Date  . Aortic stenosis   . Chronic kidney disease, stage II (mild)   . Decreased libido   . Diabetes mellitus without complication ([place]   . Glaucoma   . Gout 2009  . Hernia 27672,0947 . Hyperlipidemia   . Hypertension   . Inguinal hernia without mention of obstruction or gangrene, unilateral or unspecified, (not specified as recurrent)   . Lumbago   . Obesity   .  Unspecified constipation    Past Surgical History:  Procedure Laterality Date  . cataract surgery    . COLONOSCOPY  2012  . EYE SURGERY  2011   cataract  . UMBILICAL HERNIA REPAIR  2011   Family History  Problem Relation Age of Onset  . Heart disease Father   . Hypertension Father   . Diabetes Sister   . Diabetes Brother   . Prostate cancer Brother   . Diabetes Sister   . Kidney disease Neg Hx   . Kidney cancer Neg Hx   . Bladder Cancer Neg Hx    Social History   Socioeconomic History  . Marital status: Widowed     Spouse name: Not on file  . Number of children: 2  . Years of education: Not on file  . Highest education level: Some college, no degree  Occupational History  . Occupation: retired     Comment: used to [employer]   Social Needs  . Financial resource strain: Not very hard  . Food insecurity    Worry: Never true    Inability: Never true  . Transportation needs    Medical: No    Non-medical: No  Tobacco Use  . Smoking status: Former Smoker    Packs/day: 1.00    Years: 10.00    Pack years: 10.00    Types: Cigarettes    Start date: 03/17/1965    Quit date: 11/27/1975    Years since quitting: 43.0  . Smokeless tobacco: Never Used  . Tobacco comment: quit 40 years  Substance and Sexual Activity  . Alcohol use: Yes    Alcohol/week: 1.0 standard drinks    Types: 1 Standard drinks or equivalent per week    Comment: socially - 1 x year  . Drug use: No  . Sexual activity: Yes    Partners: Female  Lifestyle  . Physical activity    Days per week: 5 days    Minutes per session: 30 min  . Stress: Not at all  Relationships  . Social connections    Talks on phone: More than three times a week    Gets together: More than three times a week    Attends religious service: 1 to 4 times per year    Active member of club or organization: No    Attends meetings of clubs or organizations: Never    Relationship status: Widowed  Other Topics Concern  . Not on file  Social History Narrative   Lives by himself in [place]   Two grown children, daughter in [place] and son in [place]   He goes to [place] to visit his sisters often    Tobacco Counseling Counseling given: Not Answered Comment: quit 40 years   Clinical Intake:  Pre-visit preparation completed: Yes  Pain : No/denies pain     BMI - recorded: 44.02 Nutritional Status: BMI > 30  Obese Nutritional Risks: None Diabetes: Yes CBG done?: No Did pt. bring in CBG monitor from home?: No   Nutrition Risk  Assessment:  Has the patient had any N/V/D within the last 2 months?  No  Does the patient have any non-healing wounds?  No  Has the patient had any unintentional weight loss or weight gain?  No   Diabetes:  Is the patient diabetic?  Yes  If diabetic, was a CBG obtained today?  No  Did the patient bring in their glucometer from home?  No  How often do you monitor your CBG's?  1-2 times daily.   Financial Strains and Diabetes Management:  Are you having any financial strains with the device, your supplies or your medication? No .  Does the patient want to be seen by Chronic Care Management for management of their diabetes?  No  Would the patient like to be referred to a Nutritionist or for Diabetic Management?  No   Diabetic Exams:  Diabetic Eye Exam: Completed 02/22/18.   Diabetic Foot Exam: Completed 11/30/18.   How often do you need to have someone help you when you read instructions, pamphlets, or other written materials from your doctor or pharmacy?: 1 - Never  Interpreter Needed?: No  Information entered by :: Clemetine Marker LPN  Activities of Daily Living In your present state of health, do you have any difficulty performing the following activities: 11/30/2018 11/30/2018  Hearing? N Y  Comment declines hearing aids -  Vision? N N  Difficulty concentrating or making decisions? N N  Walking or climbing stairs? N N  Dressing or bathing? N N  Doing errands, shopping? N N  Preparing Food and eating ? N -  Using the Toilet? N -  In the past six months, have you accidently leaked urine? N -  Do you have problems with loss of bowel control? N -  Managing your Medications? N -  Managing your Finances? N -  Housekeeping or managing your Housekeeping? N -  Some recent data might be hidden     Immunizations and Health Maintenance Immunization History  Administered Date(s) Administered  . Influenza, High Dose Seasonal PF 11/27/2015, 11/19/2016, 11/25/2017  . Influenza,inj,Quad  PF,6+ Mos 11/09/2014, 11/30/2018  . Pneumococcal Conjugate-13 11/19/2016  . Pneumococcal Polysaccharide-23 11/30/2018  . Tdap 02/18/2017   Health Maintenance Due  Topic Date Due  . URINE MICROALBUMIN  11/26/2018    Patient Care Team: Steele Sizer, MD as PCP - General (Family Medicine) Steele Sizer, MD as PCP - Internal Medicine (Family Medicine) Minna Merritts, MD as PCP - Cardiology (Cardiology) Bary Castilla Forest Gleason, MD (General Surgery) Nori Riis, PA-C as Physician Assistant (Urology)  Indicate any recent Medical Services you may have received from other than [facility] providers in the past year (date may be approximate).    Assessment:   This is a routine wellness examination for Mikey.  Hearing/Vision screen  Hearing Screening   '125Hz'  '250Hz'  '500Hz'  '1000Hz'  '2000Hz'  '3000Hz'  '4000Hz'  '6000Hz'  '8000Hz'   Right ear:           Left ear:           Comments: Pt denies hearing difficulty  Vision Screening Comments: Annual vision screenings with Dr. Michelene Heady at [facility] issues and exercise activities discussed: Current Exercise Habits: Home exercise routine, Type of exercise: walking, Time (Minutes): 35, Frequency (Times/Week): 5, Weekly Exercise (Minutes/Week): 175, Intensity: Mild, Exercise limited by: None identified  Goals    . Patient Stated     Lower A1c to less than 7.0      Depression Screen PHQ 2/9 Scores 11/30/2018 07/30/2018 11/25/2017 08/19/2016  PHQ - 2 Score 3 0 0 0  PHQ- 9 Score 10 0 - -    Fall Risk Fall Risk  11/30/2018 07/30/2018 03/29/2018 11/25/2017 06/19/2017  Falls in the past year? 0 0 0 No No  Number falls in past yr: 0 0 0 - -  Injury with Fall? 0 0 0 - -  Risk for fall due to : - - - - -  Follow up - - - - -  FALL RISK PREVENTION PERTAINING TO THE HOME:  Any stairs in or around the home? Yes  If so, do they handrails? Yes   Home free of loose throw rugs in walkways, pet beds, electrical cords, etc? Yes  Adequate lighting in  your home to reduce risk of falls? Yes   ASSISTIVE DEVICES UTILIZED TO PREVENT FALLS:  Life alert? No  Use of a cane, walker or w/c? No  Grab bars in the bathroom? Yes  Shower chair or bench in shower? No  Elevated toilet seat or a handicapped toilet? Yes  DME ORDERS:  DME order needed?  No   TIMED UP AND GO:  Was the test performed? Yes .  Length of time to ambulate 10 feet: 6 sec.   GAIT:  Appearance of gait: Gait stead-fast and without the use of an assistive device.  Education: Fall risk prevention has been discussed.  Intervention(s) required? No    Cognitive Function:     6CIT Screen 11/30/2018  What Year? 0 points  What month? 0 points  What time? 0 points  Count back from 20 0 points  Months in reverse 0 points  Repeat phrase 6 points  Total Score 6    Screening Tests Health Maintenance  Topic Date Due  . URINE MICROALBUMIN  11/26/2018  . OPHTHALMOLOGY EXAM  02/23/2019  . HEMOGLOBIN A1C  05/30/2019  . FOOT EXAM  11/30/2019  . TETANUS/TDAP  02/19/2027  . INFLUENZA VACCINE  Completed  . PNA vac Low Risk Adult  Completed    Qualifies for Shingles Vaccine? Yes  Zostavax completed per patient. Due for Shingrix. Education has been provided regarding the importance of this vaccine. Pt has been advised to call insurance company to determine out of pocket expense. Advised may also receive vaccine at local pharmacy or Health Dept. Verbalized acceptance and understanding.  Tdap: Up to date  Flu Vaccine: Up to date  Pneumococcal Vaccine: Up to date  Cancer Screenings:  Colorectal Screening: Completed 10/16/10.  No longer required.   Lung Cancer Screening: (Low Dose CT Chest recommended if Age 8-80 years, 30 pack-year currently smoking OR have quit w/in 15years.) does not qualify.   Additional Screening:  Hepatitis C Screening: no longer required  Vision Screening: Recommended annual ophthalmology exams for early detection of glaucoma and other disorders  of the eye. Is the patient up to date with their annual eye exam?  Yes  Who is the provider or what is the name of the office in which the pt attends annual eye exams? Dr. Michelene Heady  Dental Screening: Recommended annual dental exams for proper oral hygiene  Community Resource Referral:  CRR required this visit?  No      Plan:    I have personally reviewed and addressed the Medicare Annual Wellness questionnaire and have noted the following in the patient's chart:  A. Medical and social history B. Use of alcohol, tobacco or illicit drugs  C. Current medications and supplements D. Functional ability and status E.  Nutritional status F.  Physical activity G. Advance directives H. List of other physicians I.  Hospitalizations, surgeries, and ER visits in previous 12 months J.  [place] such as hearing and vision if needed, cognitive and depression L. Referrals and appointments   In addition, I have reviewed and discussed with patient certain preventive protocols, quality metrics, and best practice recommendations. A written personalized care plan for preventive services as well as general preventive health recommendations were provided to patient.  Signed,  Clemetine Marker, LPN Nurse Health Advisor   Nurse Notes: pt dong well and appreciative of visit today.

## 2018-12-01 LAB — CBC WITH DIFFERENTIAL/PLATELET
Absolute Monocytes: 485 cells/uL (ref 200–950)
Basophils Absolute: 10 cells/uL (ref 0–200)
Basophils Relative: 0.2 %
Eosinophils Absolute: 39 cells/uL (ref 15–500)
Eosinophils Relative: 0.8 %
HCT: 42.3 % (ref 38.5–50.0)
Hemoglobin: 14.2 g/dL (ref 13.2–17.1)
Lymphs Abs: 1112 cells/uL (ref 850–3900)
MCH: 29.5 pg (ref 27.0–33.0)
MCHC: 33.6 g/dL (ref 32.0–36.0)
MCV: 87.8 fL (ref 80.0–100.0)
MPV: 11.5 fL (ref 7.5–12.5)
Monocytes Relative: 9.9 %
Neutro Abs: 3254 cells/uL (ref 1500–7800)
Neutrophils Relative %: 66.4 %
Platelets: 210 10*3/uL (ref 140–400)
RBC: 4.82 10*6/uL (ref 4.20–5.80)
RDW: 14.9 % (ref 11.0–15.0)
Total Lymphocyte: 22.7 %
WBC: 4.9 10*3/uL (ref 3.8–10.8)

## 2018-12-01 LAB — COMPLETE METABOLIC PANEL WITH GFR
AG Ratio: 1.2 (calc) (ref 1.0–2.5)
ALT: 12 U/L (ref 9–46)
AST: 16 U/L (ref 10–35)
Albumin: 4.2 g/dL (ref 3.6–5.1)
Alkaline phosphatase (APISO): 60 U/L (ref 35–144)
BUN: 18 mg/dL (ref 7–25)
CO2: 26 mmol/L (ref 20–32)
Calcium: 10.1 mg/dL (ref 8.6–10.3)
Chloride: 102 mmol/L (ref 98–110)
Creat: 0.96 mg/dL (ref 0.70–1.18)
GFR, Est African American: 88 mL/min/{1.73_m2} (ref >=60)
GFR, Est Non African American: 76 mL/min/{1.73_m2} (ref >=60)
Globulin: 3.5 g/dL (calc) (ref 1.9–3.7)
Glucose, Bld: 174 mg/dL — ABNORMAL HIGH (ref 65–99)
Potassium: 5 mmol/L (ref 3.5–5.3)
Sodium: 137 mmol/L (ref 135–146)
Total Bilirubin: 0.7 mg/dL (ref 0.2–1.2)
Total Protein: 7.7 g/dL (ref 6.1–8.1)

## 2018-12-01 LAB — LIPID PANEL
Cholesterol: 127 mg/dL (ref <200)
HDL: 46 mg/dL (ref >=40)
LDL Cholesterol (Calc): 68 mg/dL (calc)
Non-HDL Cholesterol (Calc): 81 mg/dL (calc) (ref <130)
Total CHOL/HDL Ratio: 2.8 (calc) (ref <5.0)
Triglycerides: 58 mg/dL (ref <150)

## 2018-12-01 LAB — MICROALBUMIN / CREATININE URINE RATIO
Creatinine, Urine: 147 mg/dL (ref 20–320)
Microalb Creat Ratio: 22 mcg/mg creat (ref <30)
Microalb, Ur: 3.2 mg/dL

## 2018-12-06 NOTE — Progress Notes (Signed)
12/07/2018 2:46 PM   Jordan [name] 13-Oct-1940 [id number]  Referring provider: Steele Sizer, MD [address] [place],   [address]  Chief Complaint  Patient presents with  . Phimosis    HPI: Patient is a 78 year old male with a history of phimosis and BPH with LU TS who presents today for follow-up.     Phimosis Patient still unable to retract his foreskin.      BPH WITH LUTS  (prostate and/or bladder) His IPSS score today is 16, which is moderate lower urinary tract symptomatology. He is mixed with his quality life due to his urinary symptoms.  His previous IPSS score was 7/2.  His previous PVR is 29 mL.    His complaints today are frequency, urgency and nocturia x 2.      He has nocturia x 2.  He states he was tested for sleep apnea, but he was found not to have it.  I have researched his past records and found a sleep study conducted on 08/15/2009.  This sleep study did find sleep apnea and recommended a sleep study with CPAP titration.    Patient denies any gross hematuria, dysuria or suprapubic/flank pain.  Patient denies any fevers, chills, nausea or vomiting.    His brother has a history of non fatal prostate cancer.  IPSS    Row Name 12/07/18 1400         International Prostate Symptom Score   How often have you had the sensation of not emptying your bladder?  Less than half the time     How often have you had to urinate less than every two hours?  About half the time     How often have you found you stopped and started again several times when you urinated?  Less than 1 in 5 times     How often have you found it difficult to postpone urination?  Almost always     How often have you had a weak urinary stream?  Less than half the time     How often have you had to strain to start urination?  Not at All     How many times did you typically get up at night to urinate?  3 Times     Total IPSS Score  16       Quality of Life due to urinary  symptoms   If you were to spend the rest of your life with your urinary condition just the way it is now how would you feel about that?  Mixed        Score:  1-7 Mild 8-19 Moderate 20-35 Severe  PMH: Past Medical History:  Diagnosis Date  . Aortic stenosis   . Chronic kidney disease, stage II (mild)   . Decreased libido   . Diabetes mellitus without complication ([place])   . Glaucoma   . Gout 2009  . Hernia 1062,6948  . Hyperlipidemia   . Hypertension   . Inguinal hernia without mention of obstruction or gangrene, unilateral or unspecified, (not specified as recurrent)   . Lumbago   . Obesity   . Unspecified constipation     Surgical History: Past Surgical History:  Procedure Laterality Date  . cataract surgery    . COLONOSCOPY  2012  . EYE SURGERY  2011   cataract  . UMBILICAL HERNIA REPAIR  2011    Home Medications:  Allergies as of 12/07/2018  Reactions   Ace Inhibitors Other (See Comments)   angioedema      Medication List       Accurate as of December 07, 2018  2:46 PM. If you have any questions, ask your nurse or doctor.        Accu-Chek Aviva Plus test strip Generic drug: glucose blood CHECK TWICE DAILY FOR DIABETES   Accu-Chek Aviva Plus w/Device Kit 1 each by Does not apply route 2 (two) times daily. Use as directed   Accu-Chek FastClix Lancets Misc 1 each by Subdermal route 2 (two) times daily.   allopurinol 300 MG tablet Commonly known as: ZYLOPRIM Take 1 tablet (300 mg total) by mouth daily.   amLODipine 5 MG tablet Commonly known as: NORVASC Take 1 tablet (5 mg total) by mouth daily.   ammonium lactate 12 % cream Commonly known as: AMLACTIN Apply topically as needed for dry skin.   aspirin 81 MG tablet Take 162 mg by mouth daily.   atorvastatin 40 MG tablet Commonly known as: LIPITOR TAKE 1 TABLET EVERY DAY   FISH OIL PO Take 1 capsule by mouth 2 (two) times daily.   glipiZIDE 5 MG 24 hr tablet Commonly known as:  GLUCOTROL XL Take 1 tablet (5 mg total) by mouth daily with breakfast.   latanoprost 0.005 % ophthalmic solution Commonly known as: XALATAN Place 1 drop into the left eye at bedtime.   metFORMIN 500 MG 24 hr tablet Commonly known as: GLUCOPHAGE-XR Take 2 tablets (1,000 mg total) by mouth daily with breakfast.   niacin 100 MG tablet Take 100 mg by mouth at bedtime.   polyethylene glycol 17 g packet Commonly known as: MIRALAX / GLYCOLAX Take 17 g by mouth daily as needed.   sildenafil 100 MG tablet Commonly known as: Viagra Take 0.5-1 tablets (50-100 mg total) by mouth daily as needed for erectile dysfunction.       Allergies:  Allergies  Allergen Reactions  . Ace Inhibitors Other (See Comments)    angioedema    Family History: Family History  Problem Relation Age of Onset  . Heart disease Father   . Hypertension Father   . Diabetes Sister   . Diabetes Brother   . Prostate cancer Brother   . Diabetes Sister   . Kidney disease Neg Hx   . Kidney cancer Neg Hx   . Bladder Cancer Neg Hx     Social History:  reports that he quit smoking about 43 years ago. His smoking use included cigarettes. He started smoking about 53 years ago. He has a 10.00 pack-year smoking history. He has never used smokeless tobacco. He reports current alcohol use of about 1.0 standard drinks of alcohol per week. He reports that he does not use drugs.  ROS: UROLOGY Frequent Urination?: Yes Hard to postpone urination?: Yes Burning/pain with urination?: No Get up at night to urinate?: Yes Leakage of urine?: No Urine stream starts and stops?: No Trouble starting stream?: No Do you have to strain to urinate?: No Blood in urine?: No Urinary tract infection?: No Sexually transmitted disease?: No Injury to kidneys or bladder?: No Painful intercourse?: No Weak stream?: No Erection problems?: Yes Penile pain?: No  Gastrointestinal Nausea?: No Vomiting?: No Indigestion/heartburn?: No  Diarrhea?: No Constipation?: No  Constitutional Fever: No Night sweats?: No Weight loss?: No Fatigue?: No  Skin Skin rash/lesions?: No Itching?: No  Eyes Blurred vision?: No Double vision?: No  Ears/Nose/Throat Sore throat?: No Sinus problems?: No  Hematologic/Lymphatic Swollen glands?:  No Easy bruising?: No  Cardiovascular Leg swelling?: No Chest pain?: No  Respiratory Cough?: No Shortness of breath?: No  Endocrine Excessive thirst?: No  Musculoskeletal Back pain?: No Joint pain?: No  Neurological Headaches?: No Dizziness?: No  Psychologic Depression?: No Anxiety?: No  Physical Exam: BP (!) 154/85 (BP Location: Left Arm, Patient Position: Sitting, Cuff Size: Normal)   Pulse 96   Ht '5\' 9"'  (1.753 m)   BMI 44.02 kg/m   Constitutional:  Well nourished. Alert and oriented, No acute distress. HEENT: [place] AT, moist mucus membranes.  Trachea midline, no masses. Cardiovascular: No clubbing, cyanosis, or edema. Respiratory: Normal respiratory effort, no increased work of breathing. GI: Abdomen is soft, non tender, non distended, no abdominal masses. Liver and spleen not palpable.  No hernias appreciated.  Stool sample for occult testing is not indicated.   GU: No CVA tenderness.  No bladder fullness or masses.  Patient with uncircumcised phallus. Unable to retract his foreskin.  No penile discharge. No penile lesions or rashes. Scrotum without lesions, cysts, rashes and/or edema.  Testicles are located scrotally bilaterally. No masses are appreciated in the testicles. Left and right epididymis are normal. Rectal: Patient with  normal sphincter tone. Anus and perineum without scarring or rashes. No rectal masses are appreciated. Prostate is approximately 60 grams, could only palpate the apex and the midportion of the gland, no nodules are appreciated. Seminal vesicles could not be palpated. Skin: No rashes, bruises or suspicious lesions. Lymph: No inguinal  adenopathy. Neurologic: Grossly intact, no focal deficits, moving all 4 extremities. Psychiatric: Normal mood and affect.  Laboratory Data: Lab Results  Component Value Date   WBC 4.9 11/30/2018   HGB 14.2 11/30/2018   HCT 42.3 11/30/2018   MCV 87.8 11/30/2018   PLT 210 11/30/2018    Lab Results  Component Value Date   CREATININE 0.96 11/30/2018    Lab Results  Component Value Date   HGBA1C 7.1 (A) 11/30/2018        Component Value Date/Time   CHOL 127 11/30/2018 0000   CHOL 131 08/27/2015 1158   HDL 46 11/30/2018 0000   HDL 40 08/27/2015 1158   CHOLHDL 2.8 11/30/2018 0000   VLDL 20 07/28/2016 1248   LDLCALC 68 11/30/2018 0000    Lab Results  Component Value Date   AST 16 11/30/2018   Lab Results  Component Value Date   ALT 12 11/30/2018    I have reviewed the labs  Assessment & Plan:    1. Phimosis Not bothersome to patient Is not wanting a circumcision or dorsal slit at this time Advised to contact us if he experiencing inability to urinate   2. BPH with LUTS IPSS score is 16/3, it is worsening Continue conservative management, avoiding bladder irritants and timed voiding's Most bothersome symptoms is/are nocturia and frequency He would like to have this addressed and would like to pursue alternative treatments as he is on multiple medications at this time and he would not be a good candidate for OAB medications as he has a history of angioedema and is over 19 years of age explained the PTNS provides treatment by indirectly providing electrical stimulation to the nerves responsible for bladder and pelvic floor function - a needle electrode generates an adjustable electrical pulse that travels to the sacral plexus via the tibial nerve which is located in the ankle, among other functions, the sacral nerve plexus regulates bladder and pelvic floor function - treatment protocol requires once-a-week treatments for 12  weeks, 30 minutes per session and many  patients begin to see improvements by the 6th treatment. Patients who respond to treatment may require occasional treatments (~ once every 3 weeks) to sustain improvements. PTNS is a low-risk procedure. The most common side-effects with PTNS treatment are temporary and minor, resulting from the placement of the needle electrode. They include minor bleeding, mild pain and skin inflammation and patients have seen up to an 80% success rate with this form of treatment RTC for PTNS  3. Nocturia Patient would benefit from repeated sleep study  Return for PTNS - need to check with insurance first .  These notes generated with voice recognition software. I apologize for typographical errors.  Zara Council, PA-C  [facility] [address] [place], [place] [address] [phone]

## 2018-12-07 ENCOUNTER — Other Ambulatory Visit: Payer: Self-pay

## 2018-12-07 ENCOUNTER — Ambulatory Visit: Payer: Medicare PPO | Admitting: Urology

## 2018-12-07 ENCOUNTER — Encounter: Payer: Self-pay | Admitting: Urology

## 2018-12-07 VITALS — BP 154/85 | HR 96 | Ht 69.0 in

## 2018-12-07 DIAGNOSIS — N471 Phimosis: Secondary | ICD-10-CM | POA: Diagnosis not present

## 2018-12-07 DIAGNOSIS — R351 Nocturia: Secondary | ICD-10-CM

## 2018-12-07 DIAGNOSIS — N138 Other obstructive and reflux uropathy: Secondary | ICD-10-CM

## 2018-12-07 DIAGNOSIS — N401 Enlarged prostate with lower urinary tract symptoms: Secondary | ICD-10-CM

## 2018-12-13 ENCOUNTER — Telehealth: Payer: Self-pay | Admitting: Urology

## 2018-12-13 NOTE — Telephone Encounter (Signed)
Patient's insurance denied his PTNS ref# [id number]  Ok per Roff as patient declined to have this done.   Sharyn Lull

## 2018-12-15 ENCOUNTER — Telehealth: Payer: Self-pay | Admitting: Urology

## 2018-12-15 NOTE — Telephone Encounter (Signed)
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

## 2018-12-15 NOTE — Telephone Encounter (Signed)
Spoke to patient, he voiced understanding.

## 2018-12-17 NOTE — Telephone Encounter (Signed)
Appt made and mailed to pt

## 2019-01-17 ENCOUNTER — Other Ambulatory Visit: Payer: Self-pay | Admitting: Family Medicine

## 2019-01-17 DIAGNOSIS — E782 Mixed hyperlipidemia: Secondary | ICD-10-CM

## 2019-02-17 ENCOUNTER — Ambulatory Visit (INDEPENDENT_AMBULATORY_CARE_PROVIDER_SITE_OTHER): Payer: Medicare PPO | Admitting: Podiatry

## 2019-02-17 ENCOUNTER — Encounter: Payer: Self-pay | Admitting: Podiatry

## 2019-02-17 ENCOUNTER — Other Ambulatory Visit: Payer: Self-pay

## 2019-02-17 DIAGNOSIS — L84 Corns and callosities: Secondary | ICD-10-CM | POA: Diagnosis not present

## 2019-02-17 DIAGNOSIS — E1142 Type 2 diabetes mellitus with diabetic polyneuropathy: Secondary | ICD-10-CM

## 2019-02-17 DIAGNOSIS — B351 Tinea unguium: Secondary | ICD-10-CM

## 2019-02-17 DIAGNOSIS — M79676 Pain in unspecified toe(s): Secondary | ICD-10-CM

## 2019-02-17 NOTE — Progress Notes (Signed)
Complaint:  Visit Type: Patient returns to my office for continued preventative foot care services. Complaint: Patient states" my nails have grown long and thick and become painful to walk and wear shoes" Patient has been diagnosed with DM with no foot complications. The patient presents for preventative foot care services. No changes to ROS.  Painful corn second toe right foot.  Podiatric Exam: Vascular: dorsalis pedis and posterior tibial pulses are palpable bilateral. Capillary return is immediate. Temperature gradient is WNL. Skin turgor WNL  Sensorium: Normal Semmes Weinstein monofilament test. Normal tactile sensation bilaterally. Nail Exam: Pt has thick disfigured discolored nails with subungual debris noted bilateral entire nail hallux through fifth toenails Ulcer Exam: There is no evidence of ulcer or pre-ulcerative changes or infection. Orthopedic Exam: Muscle tone and strength are WNL. No limitations in general ROM. No crepitus or effusions noted. HAV  B/L. Hammer toes second  B/L Skin: No Porokeratosis. No infection or ulcers.  Corn second toe medial aspect @ PIPJ.   Diagnosis:  Onychomycosis, , Pain in right toe, pain in left toes, Corn/callus  Treatment & Plan Procedures and Treatment: Consent by patient was obtained for treatment procedures. The patient understood the discussion of treatment and procedures well. All questions were answered thoroughly reviewed. Debridement of mycotic and hypertrophic toenails, 1 through 5 bilateral and clearing of subungual debris. No ulceration, no infection noted.   Debride corn second toe right foot. Return Visit-Office Procedure: Patient instructed to return to the office for a follow up visit 3 months for continued evaluation and treatment.    Gardiner Barefoot DPM

## 2019-02-21 DIAGNOSIS — E119 Type 2 diabetes mellitus without complications: Secondary | ICD-10-CM | POA: Diagnosis not present

## 2019-02-21 DIAGNOSIS — H401122 Primary open-angle glaucoma, left eye, moderate stage: Secondary | ICD-10-CM | POA: Diagnosis not present

## 2019-02-21 LAB — HM DIABETES EYE EXAM

## 2019-02-22 ENCOUNTER — Encounter: Payer: Self-pay | Admitting: Family Medicine

## 2019-04-01 ENCOUNTER — Encounter: Payer: Self-pay | Admitting: Family Medicine

## 2019-04-01 ENCOUNTER — Other Ambulatory Visit: Payer: Self-pay

## 2019-04-01 ENCOUNTER — Ambulatory Visit (INDEPENDENT_AMBULATORY_CARE_PROVIDER_SITE_OTHER): Payer: Medicare HMO | Admitting: Family Medicine

## 2019-04-01 VITALS — BP 140/66 | HR 102 | Temp 96.9°F | Resp 16 | Ht 69.0 in | Wt 307.1 lb

## 2019-04-01 DIAGNOSIS — E782 Mixed hyperlipidemia: Secondary | ICD-10-CM

## 2019-04-01 DIAGNOSIS — N521 Erectile dysfunction due to diseases classified elsewhere: Secondary | ICD-10-CM

## 2019-04-01 DIAGNOSIS — E1129 Type 2 diabetes mellitus with other diabetic kidney complication: Secondary | ICD-10-CM

## 2019-04-01 DIAGNOSIS — M109 Gout, unspecified: Secondary | ICD-10-CM | POA: Diagnosis not present

## 2019-04-01 DIAGNOSIS — I350 Nonrheumatic aortic (valve) stenosis: Secondary | ICD-10-CM

## 2019-04-01 DIAGNOSIS — E785 Hyperlipidemia, unspecified: Secondary | ICD-10-CM

## 2019-04-01 DIAGNOSIS — R809 Proteinuria, unspecified: Secondary | ICD-10-CM | POA: Diagnosis not present

## 2019-04-01 DIAGNOSIS — E1140 Type 2 diabetes mellitus with diabetic neuropathy, unspecified: Secondary | ICD-10-CM | POA: Diagnosis not present

## 2019-04-01 DIAGNOSIS — J410 Simple chronic bronchitis: Secondary | ICD-10-CM

## 2019-04-01 DIAGNOSIS — I10 Essential (primary) hypertension: Secondary | ICD-10-CM

## 2019-04-01 LAB — POCT GLYCOSYLATED HEMOGLOBIN (HGB A1C): HbA1c, POC (controlled diabetic range): 7.6 % — AB (ref 0.0–7.0)

## 2019-04-01 MED ORDER — ACCU-CHEK AVIVA PLUS VI STRP: 1.0000 | ORAL_STRIP | Freq: Two times a day (BID) | 2 refills | Status: DC | PRN

## 2019-04-01 MED ORDER — ATORVASTATIN CALCIUM 40 MG PO TABS: 40.0000 mg | ORAL_TABLET | Freq: Every day | ORAL | 1 refills | Status: DC

## 2019-04-01 MED ORDER — ALLOPURINOL 300 MG PO TABS: 300.0000 mg | ORAL_TABLET | Freq: Every day | ORAL | 1 refills | Status: DC

## 2019-04-01 MED ORDER — ACCU-CHEK AVIVA PLUS W/DEVICE KIT: 1.0000 | PACK | Freq: Two times a day (BID) | 12 refills | Status: DC

## 2019-04-01 MED ORDER — GLIPIZIDE ER 5 MG PO TB24: 5.0000 mg | ORAL_TABLET | Freq: Every day | ORAL | 1 refills | Status: DC

## 2019-04-01 MED ORDER — SPIRONOLACTONE 50 MG PO TABS: 50.0000 mg | ORAL_TABLET | Freq: Every day | ORAL | 1 refills | Status: DC

## 2019-04-01 MED ORDER — METFORMIN HCL ER 500 MG PO TB24: 1000.0000 mg | ORAL_TABLET | Freq: Every day | ORAL | 1 refills | Status: DC

## 2019-04-01 NOTE — Telephone Encounter (Signed)
Refill request for general medication. Spirolactone, Glucose meter and strips/  Last office visit 11/30/2018  Follow up 04/01/2019

## 2019-04-01 NOTE — Progress Notes (Signed)
Name: Jordan [name]   MRN: [id number]    DOB: 1940/03/19   Date:04/01/2019       Progress Note  Subjective  Chief Complaint  Chief Complaint  Patient presents with  . Medication Refill  . Hypertension  . Diabetes  . Aortic Valve stenosis  . Hyperlipidemia  . Benign Prostatic Hypertrophy    HPI  DM with microalbuminuria and ED: he is back on Glipizide 82m ER and still on Metformin one daily  HgbA1C used to be above 9% but he has changed his diet, walking daily taking medication as prescribed andA1C today went up a little from 7.1% to 7.6 %, however he states not as compliant with his diet over the holiday season, but he will resume eating healthier now. Fasting glucose between 120 -150's, mostly around 140 fasting.  Viagra is working well and uses prn for ED.He cannot take ACE because of history of angioedema, bp is controlled with Norvasc.  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  [facility], he still has nocturia 2-3 times per night. He states unchanged   HTN: he is on Norvasc and bp isat goal today, continue medications.  He denieschest pain, palpitation or decrease in exercise tolerance.He denies significant swelling with medication  Hyperlipidemia: taking lipitor and denies side effects, no myalgia.Reviewed last las and LDL was down to 77 but still not at goal, discussed changing to Crestor, he wants to continue current medication for now and we will recheck labs today .  Obesity:he is doing well,Hehadlost 13 lbs in 2019however he has gained 10 lbs in the past 12 months.He has been walking at the mall for 30 minutes 5 days a week, also  avoiding sweet beverages.  Aortic Valve stenosis:seen by RChristell Faith, PA at Dr. [facility] may 2020  ,  no syncope or chest pain, had echo normal EF. No change in medications   Patient Active Problem List   Diagnosis Date Noted  . Chronic bronchitis ([facility] 07/30/2018  . Bilateral hydrocele 11/27/2015  . Mass, scrotum 11/27/2015  . Ventral hernia without obstruction or gangrene 11/27/2015  . Controlled type 2 diabetes mellitus with microalbuminuria ([facility] 10/26/2015  . 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 Hyperlipidemia 11/07/2014  . Essential hypertension 11/07/2014  . Obesity, Class III, BMI 40-49.9 (morbid obesity) ([facility] 11/07/2014  . Gout 11/07/2014  . 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  . Epistaxis 09/21/2014  . 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 Aortic valve stenosis 12/02/2012  . Constipation 10/26/2012  . Incisional hernia, without obstruction or gangrene 10/26/2012  . Left inguinal hernia     Past Surgical History:  Procedure Laterality Date  . cataract surgery    . COLONOSCOPY  2012  . EYE SURGERY  2011   cataract  . UMBILICAL HERNIA REPAIR  2011    Family History  Problem Relation Age of Onset  . Heart disease Father   . Hypertension Father   . Diabetes Sister   . Diabetes Brother   . Prostate cancer Brother   . Diabetes Sister   . Kidney disease Neg Hx   . Kidney cancer Neg Hx   . Bladder  Cancer Neg Hx     Social History   Socioeconomic History  . Marital status: Widowed    Spouse name: Not on file  . Number of children: 2  . Years of education: Not on file  . Highest education level: Some college, no degree  Occupational History  . Occupation: retired     Comment: used to [employer]   Tobacco Use  . Smoking status: Former Smoker    Packs/day: 1.00    Years: 10.00    Pack years: 10.00    Types: Cigarettes    Start date: 03/17/1965    Quit date: 11/27/1975     Years since quitting: 43.3  . Smokeless tobacco: Never Used  . Tobacco comment: quit 40 years  Substance and Sexual Activity  . Alcohol use: Yes    Alcohol/week: 1.0 standard drinks    Types: 1 Standard drinks or equivalent per week    Comment: socially - 1 x year  . Drug use: No  . Sexual activity: Yes    Partners: Female  Other Topics Concern  . Not on file  Social History Narrative   Lives by himself in [place]   Two grown children, daughter in [place] and son in [place]   He goes to [place] to visit his sisters often    Social Determinants of Health   Financial Resource Strain: Medium Risk  . Difficulty of Paying Living Expenses: Somewhat hard  Food Insecurity: No Food Insecurity  . Worried About Charity fundraiser in the Last Year: Never true  . Ran Out of Food in the Last Year: Never true  Transportation Needs: No Transportation Needs  . Lack of Transportation (Medical): No  . Lack of Transportation (Non-Medical): No  Physical Activity: Sufficiently Active  . Days of Exercise per Week: 5 days  . Minutes of Exercise per Session: 30 min  Stress: No Stress Concern Present  . Feeling of Stress : Not at all  Social Connections: Slightly Isolated  . Frequency of Communication with Friends and Family: More than three times a week  . Frequency of Social Gatherings with Friends and Family: More than three times a week  . Attends Religious Services: More than 4 times per year  . Active Member of Clubs or Organizations: Yes  . Attends Archivist Meetings: More than 4 times per year  . Marital Status: Widowed  Intimate Partner Violence: Not At Risk  . Fear of Current or Ex-Partner: No  . Emotionally Abused: No  . Physically Abused: No  . Sexually Abused: No     Current Outpatient Medications:  .  ACCU-CHEK AVIVA PLUS test strip, CHECK TWICE DAILY FOR DIABETES, Disp: 200 strip, Rfl: 2 .  ACCU-CHEK FASTCLIX LANCETS MISC, 1 each by Subdermal route 2 (two) times  daily., Disp: 102 each, Rfl: 2 .  allopurinol (ZYLOPRIM) 300 MG tablet, Take 1 tablet (300 mg total) by mouth daily., Disp: 90 tablet, Rfl: 1 .  amLODipine (NORVASC) 5 MG tablet, Take 1 tablet (5 mg total) by mouth daily., Disp: 90 tablet, Rfl: 3 .  ammonium lactate (AMLACTIN) 12 % cream, Apply topically as needed for dry skin., Disp: 385 g, Rfl: 1 .  aspirin 81 MG tablet, Take 162 mg by mouth daily., Disp: , Rfl:  .  atorvastatin (LIPITOR) 40 MG tablet, Take 1 tablet (40 mg total) by mouth daily., Disp: 90 tablet, Rfl: 1 .  Blood Glucose Monitoring Suppl (ACCU-CHEK AVIVA PLUS) w/Device KIT, 1 each by  Does not apply route 2 (two) times daily. Use as directed, Disp: 100 kit, Rfl: 12 .  glipiZIDE (GLUCOTROL XL) 5 MG 24 hr tablet, Take 1 tablet (5 mg total) by mouth daily with breakfast., Disp: 90 tablet, Rfl: 1 .  latanoprost (XALATAN) 0.005 % ophthalmic solution, Place 1 drop into the left eye at bedtime. , Disp: , Rfl:  .  metFORMIN (GLUCOPHAGE-XR) 500 MG 24 hr tablet, Take 2 tablets (1,000 mg total) by mouth daily with breakfast., Disp: 90 tablet, Rfl: 1 .  niacin 100 MG tablet, Take 100 mg by mouth at bedtime., Disp: , Rfl:  .  Omega-3 Fatty Acids (FISH OIL PO), Take 1 capsule by mouth 2 (two) times daily. , Disp: , Rfl:  .  polyethylene glycol (MIRALAX / GLYCOLAX) packet, Take 17 g by mouth daily as needed., Disp: , Rfl:  .  sildenafil (VIAGRA) 100 MG tablet, Take 0.5-1 tablets (50-100 mg total) by mouth daily as needed for erectile dysfunction., Disp: 30 tablet, Rfl: 2  Allergies  Allergen Reactions  . Ace Inhibitors Other (See Comments)    angioedema    I personally reviewed active problem list, medication list, allergies, family history, social history with the patient/caregiver today.   ROS  Constitutional: Negative for fever, positive for  weight change.  Respiratory: positive  for cough but no shortness of breath.   Cardiovascular: Negative for chest pain or palpitations.   Gastrointestinal: Negative for abdominal pain, no bowel changes.  Musculoskeletal: Negative for gait problem or joint swelling.  Skin: Negative for rash.  Neurological: Negative for dizziness or headache.  No other specific complaints in a complete review of systems (except as listed in HPI above).  Objective  Vitals:   04/01/19 1130  BP: 140/66  Pulse: (!) 102  Resp: 16  Temp: (!) 96.9 F (36.1 C)  TempSrc: Temporal  SpO2: 95%  Weight: (!) 307 lb 1.6 oz (139.3 kg)  Height: '5\' 9"'  (1.753 m)    Body mass index is 45.35 kg/m.  Physical Exam  Constitutional: Patient appears well-developed and well-nourished. Obese  No distress.  HEENT: head atraumatic, normocephalic, pupils equal and reactive to light Cardiovascular: Normal rate, regular rhythm and normal heart sounds.  No murmur heard. No BLE edema. Pulmonary/Chest: Effort normal and breath sounds normal. No respiratory distress. Abdominal: Soft.  There is no tenderness. Psychiatric: Patient has a normal mood and affect. behavior is normal. Judgment and thought content normal.  Recent Results (from the past 2160 hour(s))  HM DIABETES EYE EXAM     Status: None   Collection Time: 02/21/19 12:00 AM  Result Value Ref Range   HM Diabetic Eye Exam No Retinopathy No Retinopathy    Comment: [facility], Dr. George Ina  POCT HgB A1C     Status: Abnormal   Collection Time: 04/01/19 11:45 AM  Result Value Ref Range   Hemoglobin A1C     HbA1c POC (<> result, manual entry)     HbA1c, POC (prediabetic range)     HbA1c, POC (controlled diabetic range) 7.6 (A) 0.0 - 7.0 %     PHQ2/9: Depression screen [facility] 2/9 04/01/2019 11/30/2018 07/30/2018 11/25/2017 08/19/2016  Decreased Interest 0 3 0 0 0  Down, Depressed, Hopeless 0 0 0 0 0  PHQ - 2 Score 0 3 0 0 0  Altered sleeping 0 1 0 - -  Tired, decreased energy 0 1 0 - -  Change in appetite 0 2 0 - -  Feeling bad or  failure about yourself  0 2 0 - -  Trouble concentrating 0 1 0 - -   Moving slowly or fidgety/restless 0 0 0 - -  Suicidal thoughts 0 0 0 - -  PHQ-9 Score 0 10 0 - -  Difficult doing work/chores - Not difficult at all - - -    phq 9 is negative   Fall Risk: Fall Risk  04/01/2019 11/30/2018 07/30/2018 03/29/2018 11/25/2017  Falls in the past year? 0 0 0 0 No  Number falls in past yr: 0 0 0 0 -  Injury with Fall? 0 0 0 0 -  Risk for fall due to : - - - - -  Follow up - - - - -    Functional Status Survey: Is the patient deaf or have difficulty hearing?: No Does the patient have difficulty seeing, even when wearing glasses/contacts?: No Does the patient have difficulty concentrating, remembering, or making decisions?: No Does the patient have difficulty walking or climbing stairs?: No Does the patient have difficulty dressing or bathing?: No Does the patient have difficulty doing errands alone such as visiting a doctor's office or shopping?: No    Assessment & Plan  1. Type 2 diabetes mellitus with microalbuminuria, without long-term current use of insulin (HCC)  - POCT HgB A1C  2. Mixed hyperlipidemia  - atorvastatin (LIPITOR) 40 MG tablet; Take 1 tablet (40 mg total) by mouth daily.  Dispense: 90 tablet; Refill: 1  3. Essential hypertension  Continue medications and monitor, slightly up today   4. Simple chronic bronchitis ([place])  Does not want inhaler   5. Dyslipidemia  On statin therapy   6. 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  Dispense: 90 tablet; Refill: 1 - glipiZIDE (GLUCOTROL XL) 5 MG 24 hr tablet; Take 1 tablet (5 mg total) by mouth daily with breakfast.  Dispense: 90 tablet; Refill: 1  7. Controlled gout  - allopurinol (ZYLOPRIM) 300 MG tablet; Take 1 tablet (300 mg total) by mouth daily.  Dispense: 90 tablet; Refill: 1  8. Aortic valve stenosis, mild   9. Obesity, Class III,  BMI 40-49.9 (morbid obesity) ([place])  Discussed with the patient the risk posed by an increased BMI. Discussed importance of portion control, calorie counting and at least 150 minutes of physical activity weekly. Avoid sweet beverages and drink more water. Eat at least 6 servings of fruit and vegetables daily

## 2019-04-18 ENCOUNTER — Ambulatory Visit: Payer: Self-pay | Admitting: *Deleted

## 2019-04-18 ENCOUNTER — Ambulatory Visit: Payer: Medicare HMO | Admitting: Internal Medicine

## 2019-04-18 NOTE — Telephone Encounter (Addendum)
Patient calls with one bloody diarrhea stool this morning, dark red.Stated yesterday's BM was normal. Denies all other symptoms. No abdominal pain/constipation. Has not taken pepto/has not eaten any red dye foods. No fever and no other covid symptoms-only the loose stool this am. Concerned that he started new medication, Spironolactone last week thinking this may be cause bleeding, also takes one-baby asa daily.  DT lead to virtual appointment.Upset to have a virtual appointment  Patient requesting to come in. He has had his 1st Covid 19 vaccine. With any dizziness or increased bleeding call back immediately.  Please call patient with appropriate appointment cell or home # is okay.  Reason for Disposition . MILD rectal bleeding (more than just a few drops or streaks)  Answer Assessment - Initial Assessment Questions 1. APPEARANCE of BLOOD: "What color is it?" "Is it passed separately, on the surface of the stool, or mixed in with the stool?"      Dark red 2. AMOUNT: "How much blood was passed?"      About a handful 3. FREQUENCY: "How many times has blood been passed with the stools?"      Once, this morning 4. ONSET: "When was the blood first seen in the stools?" (Days or weeks)      Just this morning 5. DIARRHEA: "Is there also some diarrhea?" If so, ask: "How many diarrhea stools were passed in past 24 hours?"      One loose stool this morning none yesterday 6. CONSTIPATION: "Do you have constipation?" If so, "How bad is it?"    no 7. RECURRENT SYMPTOMS: "Have you had blood in your stools before?" If so, ask: "When was the last time?" and "What happened that time?"     no 8. BLOOD THINNERS: "Do you take any blood thinners?" (e.g., Coumadin/warfarin, Pradaxa/dabigatran, aspirin)     2 asa's daily 9. OTHER SYMPTOMS: "Do you have any other symptoms?"  (e.g., abdominal pain, vomiting, dizziness, fever)    no 10. PREGNANCY: "Is there any chance you are pregnant?" "When was your last menstrual  period?"       Na  Protocols used: RECTAL BLEEDING-A-AH

## 2019-04-18 NOTE — Telephone Encounter (Signed)
Pt is scheduled for 04/26/2019

## 2019-04-20 ENCOUNTER — Other Ambulatory Visit: Payer: Self-pay

## 2019-04-20 ENCOUNTER — Inpatient Hospital Stay (HOSPITAL_COMMUNITY)
Admission: EM | Admit: 2019-04-20 | Discharge: 2019-04-23 | DRG: 378 | Disposition: A | Discharge status: Home / Self Care | Payer: Medicare HMO | Attending: Family Medicine | Admitting: Family Medicine

## 2019-04-20 ENCOUNTER — Emergency Department (HOSPITAL_COMMUNITY): Payer: Medicare HMO

## 2019-04-20 ENCOUNTER — Encounter (HOSPITAL_COMMUNITY): Payer: Self-pay

## 2019-04-20 DIAGNOSIS — K029 Dental caries, unspecified: Secondary | ICD-10-CM | POA: Diagnosis present

## 2019-04-20 DIAGNOSIS — Z03818 Encounter for observation for suspected exposure to other biological agents ruled out: Secondary | ICD-10-CM | POA: Diagnosis not present

## 2019-04-20 DIAGNOSIS — R0902 Hypoxemia: Secondary | ICD-10-CM | POA: Diagnosis not present

## 2019-04-20 DIAGNOSIS — M109 Gout, unspecified: Secondary | ICD-10-CM | POA: Diagnosis present

## 2019-04-20 DIAGNOSIS — Z20822 Contact with and (suspected) exposure to covid-19: Secondary | ICD-10-CM | POA: Diagnosis present

## 2019-04-20 DIAGNOSIS — K625 Hemorrhage of anus and rectum: Secondary | ICD-10-CM | POA: Diagnosis present

## 2019-04-20 DIAGNOSIS — K5731 Diverticulosis of large intestine without perforation or abscess with bleeding: Secondary | ICD-10-CM | POA: Diagnosis present

## 2019-04-20 DIAGNOSIS — H409 Unspecified glaucoma: Secondary | ICD-10-CM | POA: Diagnosis present

## 2019-04-20 DIAGNOSIS — E785 Hyperlipidemia, unspecified: Secondary | ICD-10-CM | POA: Diagnosis present

## 2019-04-20 DIAGNOSIS — R945 Abnormal results of liver function studies: Secondary | ICD-10-CM | POA: Diagnosis not present

## 2019-04-20 DIAGNOSIS — R58 Hemorrhage, not elsewhere classified: Secondary | ICD-10-CM | POA: Diagnosis not present

## 2019-04-20 DIAGNOSIS — R809 Proteinuria, unspecified: Secondary | ICD-10-CM | POA: Diagnosis not present

## 2019-04-20 DIAGNOSIS — K5730 Diverticulosis of large intestine without perforation or abscess without bleeding: Secondary | ICD-10-CM | POA: Diagnosis not present

## 2019-04-20 DIAGNOSIS — Z7984 Long term (current) use of oral hypoglycemic drugs: Secondary | ICD-10-CM

## 2019-04-20 DIAGNOSIS — E1122 Type 2 diabetes mellitus with diabetic chronic kidney disease: Secondary | ICD-10-CM | POA: Diagnosis present

## 2019-04-20 DIAGNOSIS — R000 Tachycardia, unspecified: Secondary | ICD-10-CM | POA: Diagnosis not present

## 2019-04-20 DIAGNOSIS — Z7982 Long term (current) use of aspirin: Secondary | ICD-10-CM

## 2019-04-20 DIAGNOSIS — E1129 Type 2 diabetes mellitus with other diabetic kidney complication: Secondary | ICD-10-CM | POA: Diagnosis not present

## 2019-04-20 DIAGNOSIS — K648 Other hemorrhoids: Secondary | ICD-10-CM | POA: Diagnosis present

## 2019-04-20 DIAGNOSIS — I350 Nonrheumatic aortic (valve) stenosis: Secondary | ICD-10-CM | POA: Diagnosis present

## 2019-04-20 DIAGNOSIS — Z833 Family history of diabetes mellitus: Secondary | ICD-10-CM | POA: Diagnosis not present

## 2019-04-20 DIAGNOSIS — Z79899 Other long term (current) drug therapy: Secondary | ICD-10-CM

## 2019-04-20 DIAGNOSIS — K922 Gastrointestinal hemorrhage, unspecified: Secondary | ICD-10-CM | POA: Diagnosis not present

## 2019-04-20 DIAGNOSIS — I10 Essential (primary) hypertension: Secondary | ICD-10-CM | POA: Diagnosis not present

## 2019-04-20 DIAGNOSIS — H524 Presbyopia: Secondary | ICD-10-CM | POA: Diagnosis not present

## 2019-04-20 DIAGNOSIS — N182 Chronic kidney disease, stage 2 (mild): Secondary | ICD-10-CM | POA: Diagnosis present

## 2019-04-20 DIAGNOSIS — D62 Acute posthemorrhagic anemia: Secondary | ICD-10-CM | POA: Diagnosis present

## 2019-04-20 DIAGNOSIS — H52221 Regular astigmatism, right eye: Secondary | ICD-10-CM | POA: Diagnosis not present

## 2019-04-20 DIAGNOSIS — Z8719 Personal history of other diseases of the digestive system: Secondary | ICD-10-CM | POA: Diagnosis not present

## 2019-04-20 DIAGNOSIS — G8929 Other chronic pain: Secondary | ICD-10-CM | POA: Diagnosis present

## 2019-04-20 DIAGNOSIS — Z6841 Body Mass Index (BMI) 40.0 and over, adult: Secondary | ICD-10-CM

## 2019-04-20 DIAGNOSIS — K921 Melena: Secondary | ICD-10-CM | POA: Diagnosis not present

## 2019-04-20 DIAGNOSIS — Q438 Other specified congenital malformations of intestine: Secondary | ICD-10-CM | POA: Diagnosis not present

## 2019-04-20 DIAGNOSIS — N400 Enlarged prostate without lower urinary tract symptoms: Secondary | ICD-10-CM | POA: Diagnosis present

## 2019-04-20 DIAGNOSIS — I129 Hypertensive chronic kidney disease with stage 1 through stage 4 chronic kidney disease, or unspecified chronic kidney disease: Secondary | ICD-10-CM | POA: Diagnosis present

## 2019-04-20 DIAGNOSIS — Z87891 Personal history of nicotine dependence: Secondary | ICD-10-CM | POA: Diagnosis not present

## 2019-04-20 DIAGNOSIS — R7989 Other specified abnormal findings of blood chemistry: Secondary | ICD-10-CM

## 2019-04-20 LAB — CBC WITH DIFFERENTIAL/PLATELET
Abs Immature Granulocytes: 0.06 10*3/uL (ref 0.00–0.07)
Basophils Absolute: 0 10*3/uL (ref 0.0–0.1)
Basophils Relative: 0 %
Eosinophils Absolute: 0 10*3/uL (ref 0.0–0.5)
Eosinophils Relative: 0 %
HCT: 36.8 % — ABNORMAL LOW (ref 39.0–52.0)
Hemoglobin: 11.6 g/dL — ABNORMAL LOW (ref 13.0–17.0)
Immature Granulocytes: 1 %
Lymphocytes Relative: 14 %
Lymphs Abs: 1.3 10*3/uL (ref 0.7–4.0)
MCH: 29.4 pg (ref 26.0–34.0)
MCHC: 31.5 g/dL (ref 30.0–36.0)
MCV: 93.4 fL (ref 80.0–100.0)
Monocytes Absolute: 0.8 10*3/uL (ref 0.1–1.0)
Monocytes Relative: 8 %
Neutro Abs: 6.8 10*3/uL (ref 1.7–7.7)
Neutrophils Relative %: 77 %
Platelets: 225 10*3/uL (ref 150–400)
RBC: 3.94 MIL/uL — ABNORMAL LOW (ref 4.22–5.81)
RDW: 15.6 % — ABNORMAL HIGH (ref 11.5–15.5)
WBC: 9 10*3/uL (ref 4.0–10.5)
nRBC: 0 % (ref 0.0–0.2)

## 2019-04-20 LAB — TYPE AND SCREEN
ABO/RH(D): A POS
Antibody Screen: NEGATIVE

## 2019-04-20 LAB — BASIC METABOLIC PANEL
Anion gap: 15 (ref 5–15)
BUN: 26 mg/dL — ABNORMAL HIGH (ref 8–23)
CO2: 19 mmol/L — ABNORMAL LOW (ref 22–32)
Calcium: 9.7 mg/dL (ref 8.9–10.3)
Chloride: 102 mmol/L (ref 98–111)
Creatinine, Ser: 1.29 mg/dL — ABNORMAL HIGH (ref 0.61–1.24)
GFR calc Af Amer: >60 mL/min (ref >60)
GFR calc non Af Amer: 53 mL/min — ABNORMAL LOW (ref >60)
Glucose, Bld: 210 mg/dL — ABNORMAL HIGH (ref 70–99)
Potassium: 4.4 mmol/L (ref 3.5–5.1)
Sodium: 136 mmol/L (ref 135–145)

## 2019-04-20 LAB — GLUCOSE, CAPILLARY: Glucose-Capillary: 134 mg/dL — ABNORMAL HIGH (ref 70–99)

## 2019-04-20 LAB — HEPATITIS PANEL, ACUTE
HCV Ab: NONREACTIVE
Hep A IgM: NONREACTIVE
Hep B C IgM: NONREACTIVE
Hepatitis B Surface Ag: NONREACTIVE

## 2019-04-20 LAB — ABO/RH: ABO/RH(D): A POS

## 2019-04-20 LAB — HEPATIC FUNCTION PANEL
ALT: 39 U/L (ref 0–44)
AST: 161 U/L — ABNORMAL HIGH (ref 15–41)
Albumin: 2.9 g/dL — ABNORMAL LOW (ref 3.5–5.0)
Alkaline Phosphatase: 166 U/L — ABNORMAL HIGH (ref 38–126)
Bilirubin, Direct: 4.2 mg/dL — ABNORMAL HIGH (ref 0.0–0.2)
Indirect Bilirubin: 3.4 mg/dL — ABNORMAL HIGH (ref 0.3–0.9)
Total Bilirubin: 7.6 mg/dL — ABNORMAL HIGH (ref 0.3–1.2)
Total Protein: 7.4 g/dL (ref 6.5–8.1)

## 2019-04-20 LAB — PROTIME-INR
INR: >10 (ref 0.8–1.2)
INR: 1.3 — ABNORMAL HIGH (ref 0.8–1.2)
Prothrombin Time: >90 seconds — ABNORMAL HIGH (ref 11.4–15.2)
Prothrombin Time: 15.9 seconds — ABNORMAL HIGH (ref 11.4–15.2)

## 2019-04-20 LAB — ACETAMINOPHEN LEVEL: Acetaminophen (Tylenol), Serum: <10 ug/mL — ABNORMAL LOW (ref 10–30)

## 2019-04-20 MED ORDER — IOHEXOL 300 MG/ML  SOLN
100.0000 mL | Freq: Once | INTRAMUSCULAR | Status: AC | PRN
  Administered 2019-04-20: 19:00:00 100 mL via INTRAVENOUS

## 2019-04-20 MED ORDER — ATORVASTATIN CALCIUM 40 MG PO TABS
40.0000 mg | ORAL_TABLET | Freq: Every day | ORAL | Status: DC
  Administered 2019-04-21 – 2019-04-22 (×2): 40 mg via ORAL
  Filled 2019-04-20 (×2): qty 1

## 2019-04-20 MED ORDER — AMLODIPINE BESYLATE 5 MG PO TABS
5.0000 mg | ORAL_TABLET | Freq: Every day | ORAL | Status: DC
  Administered 2019-04-21 – 2019-04-23 (×3): 5 mg via ORAL
  Filled 2019-04-20 (×3): qty 1

## 2019-04-20 MED ORDER — POLYETHYLENE GLYCOL 3350 17 G PO PACK
17.0000 g | PACK | Freq: Every day | ORAL | Status: DC
  Administered 2019-04-20: 17 g via ORAL
  Filled 2019-04-20: qty 1

## 2019-04-20 MED ORDER — INSULIN ASPART 100 UNIT/ML ~~LOC~~ SOLN
0.0000 [IU] | SUBCUTANEOUS | Status: DC
  Administered 2019-04-21: 1 [IU] via SUBCUTANEOUS
  Administered 2019-04-21: 3 [IU] via SUBCUTANEOUS
  Administered 2019-04-21: 1 [IU] via SUBCUTANEOUS
  Administered 2019-04-21: 9 [IU] via SUBCUTANEOUS
  Administered 2019-04-21 – 2019-04-22 (×2): 1 [IU] via SUBCUTANEOUS
  Administered 2019-04-22: 2 [IU] via SUBCUTANEOUS
  Administered 2019-04-22: 3 [IU] via SUBCUTANEOUS
  Administered 2019-04-22: 1 [IU] via SUBCUTANEOUS
  Administered 2019-04-23: 2 [IU] via SUBCUTANEOUS
  Filled 2019-04-20: qty 0.09

## 2019-04-20 MED ORDER — ACETAMINOPHEN 650 MG RE SUPP: 650.0000 mg | Freq: Four times a day (QID) | RECTAL | Status: DC | PRN

## 2019-04-20 MED ORDER — ONDANSETRON HCL 4 MG/2ML IJ SOLN: 4.0000 mg | Freq: Four times a day (QID) | INTRAMUSCULAR | Status: DC | PRN

## 2019-04-20 MED ORDER — LATANOPROST 0.005 % OP SOLN
1.0000 [drp] | Freq: Every day | OPHTHALMIC | Status: DC
  Administered 2019-04-20 – 2019-04-22 (×3): 1 [drp] via OPHTHALMIC
  Filled 2019-04-20: qty 2.5

## 2019-04-20 MED ORDER — SPIRONOLACTONE 25 MG PO TABS
50.0000 mg | ORAL_TABLET | Freq: Every day | ORAL | Status: DC
  Administered 2019-04-21 – 2019-04-23 (×3): 50 mg via ORAL
  Filled 2019-04-20 (×3): qty 2

## 2019-04-20 MED ORDER — ACETAMINOPHEN 325 MG PO TABS: 650.0000 mg | ORAL_TABLET | Freq: Four times a day (QID) | ORAL | Status: DC | PRN

## 2019-04-20 NOTE — H&P (Addendum)
TRH H&P    Patient Demographics:    Jordan [name], is a 79 y.o. male  MRN: [id number]  DOB - Sep 20, 1940  Admit Date - 04/20/2019  Referring MD/NP/PA:  Aletta Edouard  Outpatient Primary MD for the patient is Steele Sizer, MD  Patient coming from:  home  Chief complaint- rectal bleeding x 2 days.    HPI:    Jordan [name]  is a 79 y.o. male,   w hypertension, hyperlipdemia, Dm2, CKD stage2, Gout, chronic back pain,  Prior ventral hernia repair,  diverticulosis,  apparently presents w rectal bleeding x 2 days.  Pt had 1 episode on Sunday.  Pt thought that a new medicine that he was put on caused it so he stopped it. Pt can't recall the name of the medication.  Pt was at [facility] and had some rectal bleeding and therefore presented to ED Pt denies fever, chills, cough, cp, palp, sob, n/v, abd pain, diarrhea, black stool  Pt does take 2 baby aspirin per nite. Pt is not taking NSAIDS.    Pt states has had prior colonoscopy in [place], [place] but can't recall the name of the physician and thinks it was > 5 years ago.  In ED,  T 98.5, P 120, R 18, Bp 111/63  Pox 98% on RA Wt 139kg  CT abd/ pelvis IMPRESSION: 1. No acute intra-abdominal or pelvic pathology. 2. Severe sigmoid and diffuse colonic diverticulosis. No bowel obstruction or active inflammation. Normal appendix. No definite evidence of active diverticular bleed by CT. 3. 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 4. 3 vessel coronary vascular calcification and advanced Aortic Atherosclerosis (ICD10-I70.0).   Wbc 9.0, Hgb 11.6, Plt 225 INR >10, repeat 1.3  Na 136, K 4.4, Bun 26, Creatinine 1.29 Ast 161, Alt 39, Alk phos 166, T. Bili 7.6 Alb 2.9  Pt will be admitted for rectal bleeding.      Review of systems:    In addition to the HPI above,  No Fever-chills, No Headache, No changes with Vision or  hearing, No problems swallowing food or Liquids, No Chest pain, Cough or Shortness of Breath, No Abdominal pain, No Nausea or Vomiting, bowel movements are regular, No Blood in Urine, No dysuria, No new skin rashes or bruises, No new joints pains-aches,  No new weakness, tingling, numbness in any extremity, No recent weight gain or loss, No polyuria, polydypsia or polyphagia, No significant Mental Stressors.  All other systems reviewed and are negative.    Past History of the following :    Past Medical History:  Diagnosis Date  . Aortic stenosis   . Chronic kidney disease, stage II (mild)   . Decreased libido   . Diabetes mellitus without complication ([place])   . Glaucoma   . Gout 2009  . Hernia [id number]  . Hyperlipidemia   . Hypertension   . Inguinal hernia without mention of obstruction or gangrene, unilateral or unspecified, (not specified as recurrent)   . Lumbago   . Obesity   . Unspecified constipation  Past Surgical History:  Procedure Laterality Date  . cataract surgery    . COLONOSCOPY  2012  . EYE SURGERY  2011   cataract  . UMBILICAL HERNIA REPAIR  2011      Social History:      Social History   Tobacco Use  . Smoking status: Former Smoker    Packs/day: 1.00    Years: 10.00    Pack years: 10.00    Types: Cigarettes    Start date: 03/17/1965    Quit date: 11/27/1975    Years since quitting: 43.4  . Smokeless tobacco: Never Used  . Tobacco comment: quit 40 years  Substance Use Topics  . Alcohol use: Yes    Alcohol/week: 1.0 standard drinks    Types: 1 Standard drinks or equivalent per week    Comment: socially - 1 x year       Family History :     Family History  Problem Relation Age of Onset  . Heart disease Father   . Hypertension Father   . Diabetes Sister   . Diabetes Brother   . Prostate cancer Brother   . Diabetes Sister   . Kidney disease Neg Hx   . Kidney cancer Neg Hx   . Bladder Cancer Neg Hx        Home  Medications:   Prior to Admission medications   Medication Sig Start Date End Date Taking? Authorizing Provider  allopurinol (ZYLOPRIM) 300 MG tablet Take 1 tablet (300 mg total) by mouth daily. 04/01/19  Yes Sowles, Drue Stager, MD  amLODipine (NORVASC) 5 MG tablet Take 1 tablet (5 mg total) by mouth daily. 08/11/18  Yes Dunn, Areta Haber, PA-C  aspirin 81 MG tablet Take 162 mg by mouth at bedtime.    Yes [provider]  atorvastatin (LIPITOR) 40 MG tablet Take 1 tablet (40 mg total) by mouth daily. 04/01/19  Yes Sowles, Drue Stager, MD  glipiZIDE (GLUCOTROL XL) 5 MG 24 hr tablet Take 1 tablet (5 mg total) by mouth daily with breakfast. 04/01/19  Yes Sowles, Drue Stager, MD  latanoprost (XALATAN) 0.005 % ophthalmic solution Place 1 drop into the left eye at bedtime.  06/18/15  Yes [provider]  metFORMIN (GLUCOPHAGE-XR) 500 MG 24 hr tablet Take 2 tablets (1,000 mg total) by mouth daily with breakfast. 04/01/19  Yes Sowles, Drue Stager, MD  niacin 100 MG tablet Take 100 mg by mouth at bedtime.   Yes [provider]  Omega-3 Fatty Acids (FISH OIL PO) Take 1 capsule by mouth 2 (two) times daily.    Yes [provider]  polyethylene glycol (MIRALAX / GLYCOLAX) packet Take 17 g by mouth at bedtime.    Yes [provider]  sildenafil (VIAGRA) 100 MG tablet Take 0.5-1 tablets (50-100 mg total) by mouth daily as needed for erectile dysfunction. 07/30/18  Yes Sowles, Drue Stager, MD  spironolactone (ALDACTONE) 50 MG tablet Take 1 tablet (50 mg total) by mouth daily. 04/01/19  Yes Sowles, Drue Stager, MD  ACCU-CHEK FASTCLIX LANCETS MISC 1 each by Subdermal route 2 (two) times daily. 02/18/17   Steele Sizer, MD  ammonium lactate (AMLACTIN) 12 % cream Apply topically as needed for dry skin. Patient not taking: Reported on 04/20/2019 10/06/18   Steele Sizer, MD  Blood Glucose Monitoring Suppl (ACCU-CHEK AVIVA PLUS) w/Device KIT 1 each by Does not apply route 2 (two) times daily. Use as directed  04/01/19   Steele Sizer, MD  glucose blood (ACCU-CHEK AVIVA PLUS) test strip 1 each  by Other route 2 (two) times daily as needed for other. Use as instructed 04/01/19   Steele Sizer, MD     Allergies:     Allergies  Allergen Reactions  . Ace Inhibitors Other (See Comments)    angioedema     Physical Exam:   Vitals  Blood pressure 107/80, pulse 96, temperature 98.5 F (36.9 C), temperature source Oral, resp. rate 16, height '5\' 9"'$  (1.753 m), weight (!) 140.6 kg, SpO2 99 %.  1.  General: axoxo3  2. Psychiatric: euthymic  3. Neurologic: Cn 2-12 intact, reflexes 2+ symmetric, diffuse with no clonus, motor 5/5 in all 4 ext  4. HEENMT:  Anicteric, pink conjunctiva,  pupils 1.63m symmetric, direct, consensual intact Neck: no jvd  5. Respiratory : CTAB  6. Cardiovascular : rrr s1, s2, 2/6 sem rusb/ apex  7. Gastrointestinal:  Abd: soft, nt, nd, +bs  8. Skin:  Ext: no c/c/e, no rash, no palmar erythema, no spider  9.Musculoskeletal:  Good ROM    Data Review:    CBC Recent Labs  Lab 04/20/19 1723  WBC 9.0  HGB 11.6*  HCT 36.8*  PLT 225  MCV 93.4  MCH 29.4  MCHC 31.5  RDW 15.6*  LYMPHSABS 1.3  MONOABS 0.8  EOSABS 0.0  BASOSABS 0.0   ------------------------------------------------------------------------------------------------------------------  Results for orders placed or performed during the hospital encounter of 04/20/19 (from the past 48 hour(s))  Basic metabolic panel     Status: Abnormal   Collection Time: 04/20/19  5:23 PM  Result Value Ref Range   Sodium 136 135 - 145 mmol/L   Potassium 4.4 3.5 - 5.1 mmol/L   Chloride 102 98 - 111 mmol/L   CO2 19 (L) 22 - 32 mmol/L   Glucose, Bld 210 (H) 70 - 99 mg/dL   BUN 26 (H) 8 - 23 mg/dL   Creatinine, Ser 1.29 (H) 0.61 - 1.24 mg/dL   Calcium 9.7 8.9 - 10.3 mg/dL   GFR calc non Af Amer 53 (L) >60 mL/min   GFR calc Af Amer >60 >60 mL/min   Anion gap 15 5 - 15    Comment: [facility]formed [facility] [address] [place] [address] CBC with Differential     Status: Abnormal   Collection Time: 04/20/19  5:23 PM  Result Value Ref Range   WBC 9.0 4.0 - 10.5 K/uL   RBC 3.94 (L) 4.22 - 5.81 MIL/uL   Hemoglobin 11.6 (L) 13.0 - 17.0 g/dL   HCT 36.8 (L) 39.0 - 52.0 %   MCV 93.4 80.0 - 100.0 fL   MCH 29.4 26.0 - 34.0 pg   MCHC 31.5 30.0 - 36.0 g/dL   RDW 15.6 (H) 11.5 - 15.5 %   Platelets 225 150 - 400 K/uL   nRBC 0.0 0.0 - 0.2 %   Neutrophils Relative % 77 %   Neutro Abs 6.8 1.7 - 7.7 K/uL   Lymphocytes Relative 14 %   Lymphs Abs 1.3 0.7 - 4.0 K/uL   Monocytes Relative 8 %   Monocytes Absolute 0.8 0.1 - 1.0 K/uL   Eosinophils Relative 0 %   Eosinophils Absolute 0.0 0.0 - 0.5 K/uL   Basophils Relative 0 %   Basophils Absolute 0.0 0.0 - 0.1 K/uL   Immature Granulocytes 1 %   Abs Immature Granulocytes 0.06 0.00 - 0.07 K/uL    Comment: Performed at [facility] [address] [place] [address] Protime-INR  Status: Abnormal   Collection Time: 04/20/19  5:23 PM  Result Value Ref Range   Prothrombin Time >90.0 (H) 11.4 - 15.2 seconds   INR >10.0 (HH) 0.8 - 1.2    Comment: REPEATED TO VERIFY CRITICAL RESULT CALLED TO, READ BACK BY AND VERIFIED WITH: C.FRANKLIN AT 1940 ON 04/20/19 BY N.THOMPSON (NOTE) INR goal varies based on device and disease states. Performed at [facility], [place] [address] [place], [place] [address]   Type and screen [place]     Status: None   Collection Time: 04/20/19  5:23 PM  Result Value Ref Range   ABO/RH(D) A POS    Antibody Screen NEG    Sample Expiration      04/23/2019,2359 Performed at [facility], [place] [address], [place], [place] [address]   ABO/Rh     Status: None (Preliminary result)   Collection Time: 04/20/19  5:23 PM  Result Value Ref Range   ABO/RH(D)      A POS Performed at [facility],  [place] [address] [place], [place] [address]   Hepatic function panel     Status: Abnormal   Collection Time: 04/20/19  7:55 PM  Result Value Ref Range   Total Protein 7.4 6.5 - 8.1 g/dL   Albumin 2.9 (L) 3.5 - 5.0 g/dL   AST 161 (H) 15 - 41 U/L   ALT 39 0 - 44 U/L   Alkaline Phosphatase 166 (H) 38 - 126 U/L   Total Bilirubin 7.6 (H) 0.3 - 1.2 mg/dL   Bilirubin, Direct 4.2 (H) 0.0 - 0.2 mg/dL   Indirect Bilirubin 3.4 (H) 0.3 - 0.9 mg/dL    Comment: Performed at [facility], [place] [address], [place], [place] [address]  Protime-INR     Status: Abnormal   Collection Time: 04/20/19  7:55 PM  Result Value Ref Range   Prothrombin Time 15.9 (H) 11.4 - 15.2 seconds   INR 1.3 (H) 0.8 - 1.2    Comment: (NOTE) INR goal varies based on device and disease states. Performed at [facility], [place] [address], [place], [place] [address]   Acetaminophen level     Status: Abnormal   Collection Time: 04/20/19  7:55 PM  Result Value Ref Range   Acetaminophen (Tylenol), Serum <10 (L) 10 - 30 ug/mL    Comment: (NOTE) Therapeutic concentrations vary significantly. A range of 10-30 ug/mL  may be an effective concentration for many patients. However, some  are best treated at concentrations outside of this range. Acetaminophen concentrations >150 ug/mL at 4 hours after ingestion  and >50 ug/mL at 12 hours after ingestion are often associated with  toxic reactions. Performed at [facility], [place] [place], [place] [address]     Chemistries  Recent Labs  Lab 04/20/19 1723 04/20/19 1955  NA 136  --   K 4.4  --   CL 102  --   CO2 19*  --   GLUCOSE 210*  --   BUN 26*  --   CREATININE 1.29*  --   CALCIUM 9.7  --   AST  --  161*  ALT  --  39  ALKPHOS  --  166*  BILITOT  --  7.6*    ------------------------------------------------------------------------------------------------------------------  ------------------------------------------------------------------------------------------------------------------ GFR: Estimated Creatinine Clearance: 66.9 mL/min (A) (by C-G formula based on SCr of 1.29 mg/dL (H)). Liver Function Tests: Recent Labs  Lab 04/20/19 1955  AST  161*  ALT 39  ALKPHOS 166*  BILITOT 7.6*  PROT 7.4  ALBUMIN 2.9*   No results for input(s): LIPASE, AMYLASE in the last 168 hours. No results for input(s): AMMONIA in the last 168 hours. Coagulation Profile: Recent Labs  Lab 04/20/19 1723 04/20/19 1955  INR >10.0* 1.3*   Cardiac Enzymes: No results for input(s): CKTOTAL, CKMB, CKMBINDEX, TROPONINI in the last 168 hours. BNP (last 3 results) No results for input(s): PROBNP in the last 8760 hours. HbA1C: No results for input(s): HGBA1C in the last 72 hours. CBG: No results for input(s): GLUCAP in the last 168 hours. Lipid Profile: No results for input(s): CHOL, HDL, LDLCALC, TRIG, CHOLHDL, LDLDIRECT in the last 72 hours. Thyroid Function Tests: No results for input(s): TSH, T4TOTAL, FREET4, T3FREE, THYROIDAB in the last 72 hours. Anemia Panel: No results for input(s): VITAMINB12, FOLATE, FERRITIN, TIBC, IRON, RETICCTPCT in the last 72 hours.  --------------------------------------------------------------------------------------------------------------- Urine analysis:    Component Value Date/Time   APPEARANCEUR Cloudy (A) 12/24/2015 1037   GLUCOSEU 2+ (A) 12/24/2015 1037   BILIRUBINUR Negative 12/24/2015 1037   PROTEINUR Negative 12/24/2015 1037   NITRITE Negative 12/24/2015 1037   LEUKOCYTESUR Trace (A) 12/24/2015 1037      Imaging Results:    CT Abdomen Pelvis W Contrast  Result Date: 04/20/2019 CLINICAL DATA:  79 year old male with rectal bleeding. Concern for acute diverticulitis. EXAM: CT ABDOMEN AND PELVIS WITH CONTRAST  TECHNIQUE: Multidetector CT imaging of the abdomen and pelvis was performed using the standard protocol following bolus administration of intravenous contrast. CONTRAST:  119m OMNIPAQUE IOHEXOL 300 MG/ML  SOLN COMPARISON:  CT of the abdomen pelvis dated 10/28/2012. FINDINGS: Lower chest: The visualized lung bases are clear. Three vessel coronary vascular calcification noted. No intra-abdominal free air or free fluid. Hepatobiliary: No focal liver abnormality is seen. No gallstones, gallbladder wall thickening, or biliary dilatation. Pancreas: Unremarkable. No pancreatic ductal dilatation or surrounding inflammatory changes. Spleen: Normal in size without focal abnormality. Adrenals/Urinary Tract: The adrenal glands are unremarkable. There is a horseshoe kidney. Several renal cysts measuring up to 3.5 cm in the inferior pole of the right kidney. Multiple additional subcentimeter hypodense lesions are too small to characterize. There is mild fullness of the left renal collecting system. No hydronephrosis on right. There is symmetric enhancement and excretion of contrast by both kidneys. The visualized ureters appear unremarkable. There is a small anterior bladder dome diverticula. Stomach/Bowel: There is severe sigmoid diverticulosis without active inflammatory changes. Additional scattered colonic diverticula noted. There is no bowel obstruction or active inflammation. The appendix is normal. Postsurgical changes of the bowel with anastomotic sutures noted. Vascular/Lymphatic: Advanced aortoiliac atherosclerotic disease. The IVC is unremarkable. No portal venous gas. There is no adenopathy. Reproductive: Enlarged prostate gland measuring approximately 5.5 cm in transverse axial diameter. Other: Postsurgical changes of ventral hernia repair. A chronic appearing collection in the midline subcutaneous soft tissues of the anterior abdominal wall along the hernia repair measures approximately 7.5 x 2.0 cm in greatest  axial dimensions and 11 cm in craniocaudal length. Musculoskeletal: Multilevel degenerative changes of the spine. No acute osseous pathology. IMPRESSION: 1. No acute intra-abdominal or pelvic pathology. 2. Severe sigmoid and diffuse colonic diverticulosis. No bowel obstruction or active inflammation. Normal appendix. No definite evidence of active diverticular bleed by CT. 3. 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 4. 3 vessel coronary vascular calcification and advanced Aortic Atherosclerosis (ICD10-I70.0). Electronically Signed   By: AAnner CreteM.D.   On:  04/20/2019 19:55       Assessment & Plan:    Principal Problem:   Rectal bleeding Active Problems:   Controlled type 2 diabetes mellitus with microalbuminuria (HCC)   Abnormal liver function  Rectal Bleeding Clear liquid til midnite then NPO Hold aspirin GI  Lyndel Safe) consulted by ED , per ED will be by in am, appreciate input  Anemia Type and screen Check cbc in am  Abnormal liver function Hold Lipitor Hold Allopurinol Check acute hepatitis panel Check cmp in am  Severe protein calorie malnutrition prostat 30 mL po bid  Dm2 STOP Metformin , Glipizide fsbs q4h, ISS  Hypertension Cont Amlodipine 4m po qday Cont Spironolactone 580mpo qday  Hyperlipidemia Hold Lipitor 4047mo qhs  Glaucoma Cont Xalatan  Gout Hold Allopurinol 300m62m qday  DVT Prophylaxis-    - SCDs   AM Labs Ordered, also please review Full Orders  Family Communication: Admission, patients condition and plan of care including tests being ordered have been discussed with the patient  who indicate understanding and agree with the plan and Code Status.  Code Status:  FULL CODE per patient  Admission status: Observation: Based on patients clinical presentation and evaluation of above clinical data, I have made determination that patient meets Observation criteria at this time.    Time spent in minutes : 70  minutes   JameJani Gravel on 04/20/2019 at 9:14 PM

## 2019-04-20 NOTE — ED Notes (Signed)
ED TO INPATIENT HANDOFF REPORT  Name/Age/Gender Jordan [name] 79 y.o. male  Code Status    Code Status Orders  (From admission, onward)         Start     Ordered   04/20/19 2120  Full code  Continuous     04/20/19 2120        Code Status History    This patient has a current code status but no historical code status.   Advance Care Planning Activity      Home/SNF/Other Home  Chief Complaint Rectal bleeding [K62.5]  Level of Care/Admitting Diagnosis ED Disposition    ED Disposition Condition Comment   Admit  Hospital Area: [facility] [id number]  Level of Care: Telemetry [5]  Admit to tele based on following criteria: Monitor for Ischemic changes  Covid Evaluation: Asymptomatic Screening Protocol (No Symptoms)  Diagnosis: Rectal bleeding [id number]  Admitting Physician: Jani Gravel [[id number]]  Attending Physician: Jani Gravel [phone]       Medical History Past Medical History:  Diagnosis Date  . Aortic stenosis   . Chronic kidney disease, stage II (mild)   . Decreased libido   . Diabetes mellitus without complication ([place])   . Glaucoma   . Gout 2009  . Hernia [id number]  . Hyperlipidemia   . Hypertension   . Inguinal hernia without mention of obstruction or gangrene, unilateral or unspecified, (not specified as recurrent)   . Lumbago   . Obesity   . Unspecified constipation     Allergies Allergies  Allergen Reactions  . Ace Inhibitors Other (See Comments)    angioedema    IV Location/Drains/Wounds Patient Lines/Drains/Airways Status   Active Line/Drains/Airways    Name:   Placement date:   Placement time:   Site:   Days:   Peripheral IV 04/20/19 Right Forearm   04/20/19    1850    Forearm   less than 1          Labs/Imaging Results for orders placed or performed during the hospital encounter of 04/20/19 (from the past 48 hour(s))  Basic metabolic panel     Status: Abnormal   Collection Time: 04/20/19  5:23 PM  Result  Value Ref Range   Sodium 136 135 - 145 mmol/L   Potassium 4.4 3.5 - 5.1 mmol/L   Chloride 102 98 - 111 mmol/L   CO2 19 (L) 22 - 32 mmol/L   Glucose, Bld 210 (H) 70 - 99 mg/dL   BUN 26 (H) 8 - 23 mg/dL   Creatinine, Ser 1.29 (H) 0.61 - 1.24 mg/dL   Calcium 9.7 8.9 - 10.3 mg/dL   GFR calc non Af Amer 53 (L) >60 mL/min   GFR calc Af Amer >60 >60 mL/min   Anion gap 15 5 - 15    Comment: Performed at [facility], [place] [address] [place],  [address]  CBC with Differential     Status: Abnormal   Collection Time: 04/20/19  5:23 PM  Result Value Ref Range   WBC 9.0 4.0 - 10.5 K/uL   RBC 3.94 (L) 4.22 - 5.81 MIL/uL   Hemoglobin 11.6 (L) 13.0 - 17.0 g/dL   HCT 36.8 (L) 39.0 - 52.0 %   MCV 93.4 80.0 - 100.0 fL   MCH 29.4 26.0 - 34.0 pg   MCHC 31.5 30.0 - 36.0 g/dL   RDW 15.6 (H) 11.5 - 15.5 %   Platelets 225 150 - 400 K/uL   nRBC 0.0  0.0 - 0.2 %   Neutrophils Relative % 77 %   Neutro Abs 6.8 1.7 - 7.7 K/uL   Lymphocytes Relative 14 %   Lymphs Abs 1.3 0.7 - 4.0 K/uL   Monocytes Relative 8 %   Monocytes Absolute 0.8 0.1 - 1.0 K/uL   Eosinophils Relative 0 %   Eosinophils Absolute 0.0 0.0 - 0.5 K/uL   Basophils Relative 0 %   Basophils Absolute 0.0 0.0 - 0.1 K/uL   Immature Granulocytes 1 %   Abs Immature Granulocytes 0.06 0.00 - 0.07 K/uL    Comment: Performed at [facility], [place] [address], [place], [place] [address]  Protime-INR     Status: Abnormal   Collection Time: 04/20/19  5:23 PM  Result Value Ref Range   Prothrombin Time >90.0 (H) 11.4 - 15.2 seconds   INR >10.0 (HH) 0.8 - 1.2    Comment: REPEATED TO VERIFY CRITICAL RESULT CALLED TO, READ BACK BY AND VERIFIED WITH: C.Desiree Daise AT 1940 ON 04/20/19 BY N.THOMPSON (NOTE) INR goal varies based on device and disease states. Performed at [facility], [place] [address], [place], [place] [address]   Type and screen [place]     Status: None    Collection Time: 04/20/19  5:23 PM  Result Value Ref Range   ABO/RH(D) A POS    Antibody Screen NEG    Sample Expiration      04/23/2019,2359 Performed at [facility], [place] [address] [place], [place] [address]   ABO/Rh     Status: None (Preliminary result)   Collection Time: 04/20/19  5:23 PM  Result Value Ref Range   ABO/RH(D)      A POS Performed at [facility], [place] [address], [place], [place] [address]   Hepatic function panel     Status: Abnormal   Collection Time: 04/20/19  7:55 PM  Result Value Ref Range   Total Protein 7.4 6.5 - 8.1 g/dL   Albumin 2.9 (L) 3.5 - 5.0 g/dL   AST 161 (H) 15 - 41 U/L   ALT 39 0 - 44 U/L   Alkaline Phosphatase 166 (H) 38 - 126 U/L   Total Bilirubin 7.6 (H) 0.3 - 1.2 mg/dL   Bilirubin, Direct 4.2 (H) 0.0 - 0.2 mg/dL   Indirect Bilirubin 3.4 (H) 0.3 - 0.9 mg/dL    Comment: Performed at [facility], [place] [address], [place], [place] [address]  Protime-INR     Status: Abnormal   Collection Time: 04/20/19  7:55 PM  Result Value Ref Range   Prothrombin Time 15.9 (H) 11.4 - 15.2 seconds   INR 1.3 (H) 0.8 - 1.2    Comment: (NOTE) INR goal varies based on device and disease states. Performed at [facility], [place] [address], [place],  [address]   Acetaminophen level     Status: Abnormal   Collection Time: 04/20/19  7:55 PM  Result Value Ref Range   Acetaminophen (Tylenol), Serum <10 (L) 10 - 30 ug/mL    Comment: (NOTE) Therapeutic concentrations vary significantly. A range of 10-30 ug/mL  may be an effective concentration for many patients. However, some  are best treated at concentrations outside of this range. Acetaminophen concentrations >150 ug/mL at 4 hours after ingestion  and >50 ug/mL at 12 hours after ingestion are often associated with  toxic reactions. Performed at [facility], [place] [address] [place],  [address]  CT Abdomen Pelvis W Contrast  Result Date: 04/20/2019 CLINICAL DATA:  79 year old male with rectal bleeding. Concern for acute diverticulitis. EXAM: CT ABDOMEN AND PELVIS WITH CONTRAST TECHNIQUE: Multidetector CT imaging of the abdomen and pelvis was performed using the standard protocol following bolus administration of intravenous contrast. CONTRAST:  119mL OMNIPAQUE IOHEXOL 300 MG/ML  SOLN COMPARISON:  CT of the abdomen pelvis dated 10/28/2012. FINDINGS: Lower chest: The visualized lung bases are clear. Three vessel coronary vascular calcification noted. No intra-abdominal free air or free fluid. Hepatobiliary: No focal liver abnormality is seen. No gallstones, gallbladder wall thickening, or biliary dilatation. Pancreas: Unremarkable. No pancreatic ductal dilatation or surrounding inflammatory changes. Spleen: Normal in size without focal abnormality. Adrenals/Urinary Tract: The adrenal glands are unremarkable. There is a horseshoe kidney. Several renal cysts measuring up to 3.5 cm in the inferior pole of the right kidney. Multiple additional subcentimeter hypodense lesions are too small to characterize. There is mild fullness of the left renal collecting system. No hydronephrosis on right. There is symmetric enhancement and excretion of contrast by both kidneys. The visualized ureters appear unremarkable. There is a small anterior bladder dome diverticula. Stomach/Bowel: There is severe sigmoid diverticulosis without active inflammatory changes. Additional scattered colonic diverticula noted. There is no bowel obstruction or active inflammation. The appendix is normal. Postsurgical changes of the bowel with anastomotic sutures noted. Vascular/Lymphatic: Advanced aortoiliac atherosclerotic disease. The IVC is unremarkable. No portal venous gas. There is no adenopathy. Reproductive: Enlarged prostate gland measuring approximately 5.5 cm in transverse axial diameter. Other: Postsurgical changes of ventral  hernia repair. A chronic appearing collection in the midline subcutaneous soft tissues of the anterior abdominal wall along the hernia repair measures approximately 7.5 x 2.0 cm in greatest axial dimensions and 11 cm in craniocaudal length. Musculoskeletal: Multilevel degenerative changes of the spine. No acute osseous pathology. IMPRESSION: 1. No acute intra-abdominal or pelvic pathology. 2. Severe sigmoid and diffuse colonic diverticulosis. No bowel obstruction or active inflammation. Normal appendix. No definite evidence of active diverticular bleed by CT. 3. 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 4. 3 vessel coronary vascular calcification and advanced Aortic Atherosclerosis (ICD10-I70.0). Electronically Signed   By: Anner Crete M.D.   On: 04/20/2019 19:55    Pending Labs Unresulted Labs (From admission, onward)    Start     Ordered   04/21/19 0500  Comprehensive metabolic panel  Tomorrow morning,   R    Question:  Release to patient  Answer:  Immediate   04/20/19 2120   04/21/19 0500  CBC  Tomorrow morning,   R    Question:  Release to patient  Answer:  Immediate   04/20/19 2120   04/20/19 2107  SARS CORONAVIRUS 2 (TAT 6-24 HRS) Nasopharyngeal Nasopharyngeal Swab  (Tier 3 (TAT 6-24 hrs))  Once,   STAT    Question Answer Comment  Is this test for diagnosis or screening Screening   Symptomatic for COVID-19 as defined by CDC No   Hospitalized for COVID-19 No   Admitted to ICU for COVID-19 No   Previously tested for COVID-19 No   Resident in a congregate (group) care setting No   Employed in healthcare setting No      04/20/19 2106   04/20/19 1945  Hepatitis panel, acute  Add-on,   AD     04/20/19 1944          Vitals/Pain Today's Vitals   04/20/19 1702 04/20/19 1713 04/20/19 1900 04/20/19 2129  BP:  111/63 107/80 (!) 144/81  Pulse:  (!) 120 96 94  Resp:  18 16 18   Temp:  98.5 F (36.9 C)    TempSrc:  Oral    SpO2:  98% 99% 98%  Weight:  (!) 139 kg (!) 140.6 kg    Height: 5\' 9"  (1.753 m) 5\' 9"  (1.753 m)    PainSc: 0-No pain       Isolation Precautions No active isolations  Medications Medications  acetaminophen (TYLENOL) tablet 650 mg (has no administration in time range)    Or  acetaminophen (TYLENOL) suppository 650 mg (has no administration in time range)  ondansetron (ZOFRAN) injection 4 mg (has no administration in time range)  insulin aspart (novoLOG) injection 0-9 Units (has no administration in time range)  iohexol (OMNIPAQUE) 300 MG/ML solution 100 mL (100 mLs Intravenous Contrast Given 04/20/19 1924)    Mobility walks

## 2019-04-20 NOTE — ED Triage Notes (Signed)
Pt BIB EMS. Pt was shopping at [facility] and started having rectal bleeding. Pt describes it as a "clot that busted and stopped after that". Pt denies blood thinners. Pt denies pain, N/V. A&O x4.  140/100 HR 112 RR 16 SpO2 97% CBG 241  20G LH

## 2019-04-20 NOTE — ED Provider Notes (Signed)
[facility] DEPT Provider Note   CSN: [id number] Arrival date & time: 04/20/19  1653     History Chief Complaint  Patient presents with  . Rectal Bleeding    Jordan [name] is a 79 y.o. male.  He is brought in by ambulance for evaluation of lower GI bleeding.  He was at [facility] on 1 of those motorized scooters when he felt like he needed to move his bowels.  He noticed that he had a warm feeling in his pants and blood was leaking through his sweatpants and down his legs.  He said he had a similar episode earlier this week when he was rushing to the bathroom to move his bowels.  Not on any anticoagulation.  No fevers or chills no chest pain shortness of breath abdominal pain.  No testicle or penile pain.  No rectal pain.  No syncope.  The history is provided by the patient.  Rectal Bleeding Quality:  Bright red Amount:  Moderate Timing: twice. Chronicity:  New Context: spontaneously   Context: not rectal pain   Similar prior episodes: no   Relieved by:  None tried Worsened by:  Nothing Ineffective treatments:  None tried Associated symptoms: no abdominal pain, no epistaxis, no fever, no hematemesis, no light-headedness, no loss of consciousness, no recent illness and no vomiting   Risk factors: no anticoagulant use        Past Medical History:  Diagnosis Date  . Aortic stenosis   . Chronic kidney disease, stage II (mild)   . Decreased libido   . Diabetes mellitus without complication ([place])   . Glaucoma   . Gout 2009  . Hernia 2119,4174  . Hyperlipidemia   . Hypertension   . Inguinal hernia without mention of obstruction or gangrene, unilateral or unspecified, (not specified as recurrent)   . Lumbago   . Obesity   . Unspecified constipation     Patient Active Problem List   Diagnosis Date Noted  . Chronic bronchitis ([place]) 07/30/2018  . Bilateral hydrocele 11/27/2015  . Mass, scrotum 11/27/2015  . Ventral hernia without obstruction  or gangrene 11/27/2015  . 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 Hyperlipidemia 11/07/2014  . Essential hypertension 11/07/2014  . Obesity, Class III, BMI 40-49.9 (morbid obesity) ([place]) 11/07/2014  . Gout 11/07/2014  . 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  . Epistaxis 09/21/2014  . 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 Aortic valve stenosis 12/02/2012  . Constipation 10/26/2012  . Incisional hernia, without obstruction or gangrene 10/26/2012  . Left inguinal hernia     Past Surgical History:  Procedure Laterality Date  . cataract surgery    . COLONOSCOPY  2012  . EYE SURGERY  2011   cataract  . UMBILICAL HERNIA REPAIR  2011       Family History  Problem Relation Age of Onset  . Heart disease Father   . Hypertension Father   . Diabetes Sister   . Diabetes Brother   . Prostate cancer Brother   . Diabetes Sister   . Kidney disease Neg Hx   . Kidney cancer Neg Hx   . Bladder Cancer Neg Hx     Social History   Tobacco Use  . Smoking status: Former Smoker    Packs/day: 1.00    Years: 10.00    Pack years: 10.00    Types: Cigarettes    Start date: 03/17/1965  Quit date: 11/27/1975    Years since quitting: 43.4  . Smokeless tobacco: Never Used  . Tobacco comment: quit 40 years  Substance Use Topics  . Alcohol use: Yes    Alcohol/week: 1.0 standard drinks    Types: 1 Standard drinks or equivalent per week    Comment: socially - 1 x year  . Drug use: No    Home Medications Prior to Admission medications   Medication Sig Start Date End Date Taking? Authorizing Provider  ACCU-CHEK FASTCLIX LANCETS MISC 1 each by Subdermal route 2 (two) times daily. 02/18/17   Steele Sizer, MD  allopurinol (ZYLOPRIM) 300 MG tablet Take 1 tablet (300 mg total) by mouth daily. 04/01/19   Steele Sizer, MD  amLODipine (NORVASC) 5 MG tablet Take 1  tablet (5 mg total) by mouth daily. 08/11/18   Dunn, Areta Haber, PA-C  ammonium lactate (AMLACTIN) 12 % cream Apply topically as needed for dry skin. 10/06/18   Steele Sizer, MD  aspirin 81 MG tablet Take 162 mg by mouth daily.    [provider]  atorvastatin (LIPITOR) 40 MG tablet Take 1 tablet (40 mg total) by mouth daily. 04/01/19   Steele Sizer, MD  Blood Glucose Monitoring Suppl (ACCU-CHEK AVIVA PLUS) w/Device KIT 1 each by Does not apply route 2 (two) times daily. Use as directed 04/01/19   Steele Sizer, MD  glipiZIDE (GLUCOTROL XL) 5 MG 24 hr tablet Take 1 tablet (5 mg total) by mouth daily with breakfast. 04/01/19   Steele Sizer, MD  glucose blood (ACCU-CHEK AVIVA PLUS) test strip 1 each by Other route 2 (two) times daily as needed for other. Use as instructed 04/01/19   Steele Sizer, MD  latanoprost (XALATAN) 0.005 % ophthalmic solution Place 1 drop into the left eye at bedtime.  06/18/15   [provider]  metFORMIN (GLUCOPHAGE-XR) 500 MG 24 hr tablet Take 2 tablets (1,000 mg total) by mouth daily with breakfast. 04/01/19   Steele Sizer, MD  niacin 100 MG tablet Take 100 mg by mouth at bedtime.    [provider]  Omega-3 Fatty Acids (FISH OIL PO) Take 1 capsule by mouth 2 (two) times daily.     [provider]  polyethylene glycol (MIRALAX / GLYCOLAX) packet Take 17 g by mouth daily as needed.    [provider]  sildenafil (VIAGRA) 100 MG tablet Take 0.5-1 tablets (50-100 mg total) by mouth daily as needed for erectile dysfunction. 07/30/18   Steele Sizer, MD  spironolactone (ALDACTONE) 50 MG tablet Take 1 tablet (50 mg total) by mouth daily. 04/01/19   Steele Sizer, MD    Allergies    Ace inhibitors  Review of Systems   Review of Systems  Constitutional: Negative for fever.  HENT: Negative for nosebleeds and sore throat.   Eyes: Negative for visual disturbance.  Respiratory: Negative for shortness of breath.     Cardiovascular: Negative for chest pain.  Gastrointestinal: Positive for hematochezia. Negative for abdominal pain, hematemesis, rectal pain and vomiting.  Genitourinary: Negative for dysuria.  Musculoskeletal: Negative for neck pain.  Skin: Negative for rash.  Neurological: Negative for loss of consciousness and light-headedness.    Physical Exam Updated Vital Signs BP 119/77 (BP Location: Left Arm)   Pulse 84   Temp 98.2 F (36.8 C) (Oral)   Resp 16   Ht '5\' 9"'  (1.753 m)   Wt (!) 136.8 kg   SpO2 98%   BMI 44.54 kg/m   Physical Exam Vitals and  nursing note reviewed.  Constitutional:      Appearance: He is well-developed.  HENT:     Head: Normocephalic and atraumatic.  Eyes:     Conjunctiva/sclera: Conjunctivae normal.  Cardiovascular:     Rate and Rhythm: Regular rhythm. Tachycardia present.     Heart sounds: No murmur.  Pulmonary:     Effort: Pulmonary effort is normal. No respiratory distress.     Breath sounds: Normal breath sounds.  Abdominal:     Palpations: Abdomen is soft.     Tenderness: There is no abdominal tenderness.  Genitourinary:    Penis: Normal.      Testes: Normal.     Comments: He has some dried red blood over his perianal area.  No obvious external hemorrhoids appreciated. Musculoskeletal:        General: Normal range of motion.     Cervical back: Neck supple.     Right lower leg: Edema present.     Left lower leg: Edema present.  Skin:    General: Skin is warm and dry.     Capillary Refill: Capillary refill takes less than 2 seconds.  Neurological:     General: No focal deficit present.     Mental Status: He is alert.     ED Results / Procedures / Treatments   Labs (all labs ordered are listed, but only abnormal results are displayed) Labs Reviewed  BASIC METABOLIC PANEL - Abnormal; Notable for the following components:      Result Value   CO2 19 (*)    Glucose, Bld 210 (*)    BUN 26 (*)    Creatinine, Ser 1.29 (*)    GFR calc  non Af Amer 53 (*)    All other components within normal limits  CBC WITH DIFFERENTIAL/PLATELET - Abnormal; Notable for the following components:   RBC 3.94 (*)    Hemoglobin 11.6 (*)    HCT 36.8 (*)    RDW 15.6 (*)    All other components within normal limits  PROTIME-INR - Abnormal; Notable for the following components:   Prothrombin Time >90.0 (*)    INR >10.0 (*)    All other components within normal limits  HEPATIC FUNCTION PANEL - Abnormal; Notable for the following components:   Albumin 2.9 (*)    AST 161 (*)    Alkaline Phosphatase 166 (*)    Total Bilirubin 7.6 (*)    Bilirubin, Direct 4.2 (*)    Indirect Bilirubin 3.4 (*)    All other components within normal limits  PROTIME-INR - Abnormal; Notable for the following components:   Prothrombin Time 15.9 (*)    INR 1.3 (*)    All other components within normal limits  ACETAMINOPHEN LEVEL - Abnormal; Notable for the following components:   Acetaminophen (Tylenol), Serum <10 (*)    All other components within normal limits  COMPREHENSIVE METABOLIC PANEL - Abnormal; Notable for the following components:   Glucose, Bld 145 (*)    BUN 26 (*)    All other components within normal limits  CBC - Abnormal; Notable for the following components:   RBC 3.42 (*)    Hemoglobin 10.0 (*)    HCT 31.6 (*)    RDW 15.7 (*)    All other components within normal limits  GLUCOSE, CAPILLARY - Abnormal; Notable for the following components:   Glucose-Capillary 134 (*)    All other components within normal limits  GLUCOSE, CAPILLARY - Abnormal; Notable for the following components:  Glucose-Capillary 133 (*)    All other components within normal limits  GLUCOSE, CAPILLARY - Abnormal; Notable for the following components:   Glucose-Capillary 138 (*)    All other components within normal limits  SARS CORONAVIRUS 2 (TAT 6-24 HRS)  HEPATITIS PANEL, ACUTE  TYPE AND SCREEN  ABO/RH    EKG None  Radiology CT Abdomen Pelvis W  Contrast  Result Date: 04/20/2019 CLINICAL DATA:  79 year old male with rectal bleeding. Concern for acute diverticulitis. EXAM: CT ABDOMEN AND PELVIS WITH CONTRAST TECHNIQUE: Multidetector CT imaging of the abdomen and pelvis was performed using the standard protocol following bolus administration of intravenous contrast. CONTRAST:  1108m OMNIPAQUE IOHEXOL 300 MG/ML  SOLN COMPARISON:  CT of the abdomen pelvis dated 10/28/2012. FINDINGS: Lower chest: The visualized lung bases are clear. Three vessel coronary vascular calcification noted. No intra-abdominal free air or free fluid. Hepatobiliary: No focal liver abnormality is seen. No gallstones, gallbladder wall thickening, or biliary dilatation. Pancreas: Unremarkable. No pancreatic ductal dilatation or surrounding inflammatory changes. Spleen: Normal in size without focal abnormality. Adrenals/Urinary Tract: The adrenal glands are unremarkable. There is a horseshoe kidney. Several renal cysts measuring up to 3.5 cm in the inferior pole of the right kidney. Multiple additional subcentimeter hypodense lesions are too small to characterize. There is mild fullness of the left renal collecting system. No hydronephrosis on right. There is symmetric enhancement and excretion of contrast by both kidneys. The visualized ureters appear unremarkable. There is a small anterior bladder dome diverticula. Stomach/Bowel: There is severe sigmoid diverticulosis without active inflammatory changes. Additional scattered colonic diverticula noted. There is no bowel obstruction or active inflammation. The appendix is normal. Postsurgical changes of the bowel with anastomotic sutures noted. Vascular/Lymphatic: Advanced aortoiliac atherosclerotic disease. The IVC is unremarkable. No portal venous gas. There is no adenopathy. Reproductive: Enlarged prostate gland measuring approximately 5.5 cm in transverse axial diameter. Other: Postsurgical changes of ventral hernia repair. A chronic  appearing collection in the midline subcutaneous soft tissues of the anterior abdominal wall along the hernia repair measures approximately 7.5 x 2.0 cm in greatest axial dimensions and 11 cm in craniocaudal length. Musculoskeletal: Multilevel degenerative changes of the spine. No acute osseous pathology. IMPRESSION: 1. No acute intra-abdominal or pelvic pathology. 2. Severe sigmoid and diffuse colonic diverticulosis. No bowel obstruction or active inflammation. Normal appendix. No definite evidence of active diverticular bleed by CT. 3. 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 4. 3 vessel coronary vascular calcification and advanced Aortic Atherosclerosis (ICD10-I70.0). Electronically Signed   By: AAnner CreteM.D.   On: 04/20/2019 19:55    Procedures Procedures (including critical care time)  Medications Ordered in ED Medications  acetaminophen (TYLENOL) tablet 650 mg (has no administration in time range)    Or  acetaminophen (TYLENOL) suppository 650 mg (has no administration in time range)  ondansetron (ZOFRAN) injection 4 mg (has no administration in time range)  insulin aspart (novoLOG) injection 0-9 Units (1 Units Subcutaneous Given 04/21/19 0851)  amLODipine (NORVASC) tablet 5 mg (5 mg Oral Given 04/21/19 0851)  atorvastatin (LIPITOR) tablet 40 mg (has no administration in time range)  spironolactone (ALDACTONE) tablet 50 mg (50 mg Oral Given 04/21/19 0851)  latanoprost (XALATAN) 0.005 % ophthalmic solution 1 drop (1 drop Left Eye Given 04/20/19 2252)  bisacodyl (DULCOLAX) EC tablet 20 mg (has no administration in time range)  iohexol (OMNIPAQUE) 300 MG/ML solution 100 mL (100 mLs Intravenous Contrast Given 04/20/19 1924)    ED Course  I have reviewed the triage vital signs and the nursing notes.  Pertinent labs & imaging results that were available during my care of the patient were reviewed by me and considered in my medical decision making (see chart for  details).  Clinical Course as of Apr 21 1007  Wed Apr 20, 2019  1945 Patient here with second episode of spontaneous rectal bleeding.  No real abdominal pain.  Hemoglobin down from his baseline to 11.6.  Creatinine slightly up to 1.29.  INR strangely came back at greater than 10.  Not on any anticoagulation.  We will repeat that and add on  LFTs acetaminophen and hepatitis panel.   [MB]  2036 Repeated his INR is 1.3.  Bili is nearly elevated from 4 months ago.   [MB]  2103 Discussed with Dr. Lyndel Safe from [place] who recommends the patient be admitted clear liquid diet and will see him tomorrow.   [MB]  2114 Discussed with Dr. Maudie Mercury from [facility] hospitalist who will evaluate the patient for admission.   [MB]    Clinical Course User Index [MB] Hayden Rasmussen, MD   MDM Rules/Calculators/A&P                       Final Clinical Impression(s) / ED Diagnoses Final diagnoses:  Lower GI bleed  Elevated LFTs    Rx / DC Orders ED Discharge Orders    None       Hayden Rasmussen, MD 04/21/19 1009

## 2019-04-21 DIAGNOSIS — K921 Melena: Secondary | ICD-10-CM | POA: Diagnosis not present

## 2019-04-21 DIAGNOSIS — R809 Proteinuria, unspecified: Secondary | ICD-10-CM

## 2019-04-21 DIAGNOSIS — K625 Hemorrhage of anus and rectum: Secondary | ICD-10-CM | POA: Diagnosis not present

## 2019-04-21 DIAGNOSIS — R945 Abnormal results of liver function studies: Secondary | ICD-10-CM

## 2019-04-21 DIAGNOSIS — Z8719 Personal history of other diseases of the digestive system: Secondary | ICD-10-CM | POA: Diagnosis not present

## 2019-04-21 DIAGNOSIS — Q438 Other specified congenital malformations of intestine: Secondary | ICD-10-CM | POA: Diagnosis not present

## 2019-04-21 DIAGNOSIS — D62 Acute posthemorrhagic anemia: Secondary | ICD-10-CM | POA: Diagnosis not present

## 2019-04-21 DIAGNOSIS — E1129 Type 2 diabetes mellitus with other diabetic kidney complication: Secondary | ICD-10-CM

## 2019-04-21 LAB — COMPREHENSIVE METABOLIC PANEL
ALT: 14 U/L (ref 0–44)
AST: 20 U/L (ref 15–41)
Albumin: 3.6 g/dL (ref 3.5–5.0)
Alkaline Phosphatase: 47 U/L (ref 38–126)
Anion gap: 7 (ref 5–15)
BUN: 26 mg/dL — ABNORMAL HIGH (ref 8–23)
CO2: 25 mmol/L (ref 22–32)
Calcium: 9.3 mg/dL (ref 8.9–10.3)
Chloride: 103 mmol/L (ref 98–111)
Creatinine, Ser: 0.89 mg/dL (ref 0.61–1.24)
GFR calc Af Amer: >60 mL/min (ref >60)
GFR calc non Af Amer: >60 mL/min (ref >60)
Glucose, Bld: 145 mg/dL — ABNORMAL HIGH (ref 70–99)
Potassium: 4.2 mmol/L (ref 3.5–5.1)
Sodium: 135 mmol/L (ref 135–145)
Total Bilirubin: 0.9 mg/dL (ref 0.3–1.2)
Total Protein: 7 g/dL (ref 6.5–8.1)

## 2019-04-21 LAB — GLUCOSE, CAPILLARY
Glucose-Capillary: 133 mg/dL — ABNORMAL HIGH (ref 70–99)
Glucose-Capillary: 138 mg/dL — ABNORMAL HIGH (ref 70–99)
Glucose-Capillary: 203 mg/dL — ABNORMAL HIGH (ref 70–99)
Glucose-Capillary: 372 mg/dL — ABNORMAL HIGH (ref 70–99)
Glucose-Capillary: 90 mg/dL (ref 70–99)

## 2019-04-21 LAB — CBC
HCT: 31.6 % — ABNORMAL LOW (ref 39.0–52.0)
Hemoglobin: 10 g/dL — ABNORMAL LOW (ref 13.0–17.0)
MCH: 29.2 pg (ref 26.0–34.0)
MCHC: 31.6 g/dL (ref 30.0–36.0)
MCV: 92.4 fL (ref 80.0–100.0)
Platelets: 178 10*3/uL (ref 150–400)
RBC: 3.42 MIL/uL — ABNORMAL LOW (ref 4.22–5.81)
RDW: 15.7 % — ABNORMAL HIGH (ref 11.5–15.5)
WBC: 6.5 10*3/uL (ref 4.0–10.5)
nRBC: 0 % (ref 0.0–0.2)

## 2019-04-21 LAB — HEMOGLOBIN AND HEMATOCRIT, BLOOD
HCT: 31.2 % — ABNORMAL LOW (ref 39.0–52.0)
Hemoglobin: 10 g/dL — ABNORMAL LOW (ref 13.0–17.0)

## 2019-04-21 LAB — SARS CORONAVIRUS 2 (TAT 6-24 HRS): SARS Coronavirus 2: NEGATIVE

## 2019-04-21 MED ORDER — PEG-KCL-NACL-NASULF-NA ASC-C 100 G PO SOLR
0.5000 | ORAL | Status: AC
  Administered 2019-04-21 – 2019-04-22 (×2): 100 g via ORAL
  Filled 2019-04-21: qty 1

## 2019-04-21 MED ORDER — METOCLOPRAMIDE HCL 5 MG/ML IJ SOLN
10.0000 mg | Freq: Once | INTRAMUSCULAR | Status: AC
  Administered 2019-04-22: 10 mg via INTRAVENOUS
  Filled 2019-04-21: qty 2

## 2019-04-21 MED ORDER — BISACODYL 5 MG PO TBEC
10.0000 mg | DELAYED_RELEASE_TABLET | Freq: Once | ORAL | Status: AC
  Administered 2019-04-22: 10 mg via ORAL
  Filled 2019-04-21: qty 2

## 2019-04-21 MED ORDER — BISACODYL 5 MG PO TBEC
20.0000 mg | DELAYED_RELEASE_TABLET | Freq: Once | ORAL | Status: AC
  Administered 2019-04-21: 20 mg via ORAL
  Filled 2019-04-21: qty 4

## 2019-04-21 MED ORDER — METOCLOPRAMIDE HCL 5 MG/ML IJ SOLN
10.0000 mg | Freq: Once | INTRAMUSCULAR | Status: AC
  Administered 2019-04-21: 10 mg via INTRAVENOUS
  Filled 2019-04-21: qty 2

## 2019-04-21 NOTE — H&P (View-Only) (Signed)
[facility] Consult: 9:00 AM 04/21/2019  LOS: 0 days    Referring Provider: Dr Georgena Spurling  Primary Care Physician:  Steele Sizer, MD in [place].   Primary Gastroenterologist: unassigned    Reason for Consultation:  Hematochezia.     HPI: Jordan [name] is a 79 y.o. male.  PMH DM 2.  HTN.  Obesity.  HLD.  Previous smoker.  Heart murmur, aortic valve stenosis; 08/2018 echo: LVEF 60-65%.  Nml R systolic function.  Severe AoV calcification w moderate  AoV stenosis.  CKD 2.  Constipation.  BPH.  Phimosis 2018.  Erectile dysfunction.  Glaucoma.  Onychomycosis.  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 Several repairs of ventral/umbilical hernias, the latest was 2011 in [place]..  2012 colonoscopy in [place].  Patient does not recall history of colon polyps and not aware of diagnosis of diverticulosis.  Received Covid vaccination #1. Wt 291 #/132.2kg in 08/2016.  298 #/135.2 kg 11/2018  Painless hematochezia on the morning of 2/1.  Recurrent, large volume bleeding 2/3 in the afternoon.  Again no abdominal pain, nausea, vomiting.  Some lightheadedness, no syncope.  She takes to low-dose aspirin daily.  In the past he took Bayer arthritis formula but has not used this in at least 6 weeks.  Hb 14.2 in mid 11/2018, 11.6 on 2/3 >> 10 this morning 2/4.  WBCs, platelets, MCV normal. Initial PT/INR >90/> 10.  But 15.9/1.3 on recheck 2 hours later. Initial T bili 7.6, alkaline phosphatase 166, AST/ALT 161-3.9, on recheck this morning these are all normal. CTAP with contrast: Severe diverticulosis of sigmoid and diffuse pan colonic diverticulosis.  No active bleeding.  Post surgical changes at ventral hernia repair line/ant abd wall with chronic midline SQ soft tissues (7.5 x 2 x 11 cm).  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  Three-vessel coronary vascular calcification and advanced aortic atherosclerosis.  Family history  Brother with prostate cancer  Social history Retired as a Curator for [employer].  He is widowed.  Has a sister who lives in [place].  He drinks beer on rare occasions.     Past Medical History:  Diagnosis Date  . Aortic stenosis   . Chronic kidney disease, stage II (mild)   . Decreased libido   . Diabetes mellitus without complication ([place])   . Glaucoma   . Gout 2009  . Hernia 5400,8676  . Hyperlipidemia   . Hypertension   . Inguinal hernia without mention of obstruction or gangrene, unilateral or unspecified, (not specified as recurrent)   . Lumbago   . Obesity   . Unspecified constipation     Past Surgical History:  Procedure Laterality Date  . cataract surgery    . COLONOSCOPY  2012  . EYE SURGERY  2011   cataract  . UMBILICAL HERNIA REPAIR  2011    Prior to Admission medications   Medication Sig Start Date End Date Taking? Authorizing Provider  allopurinol (ZYLOPRIM) 300 MG tablet Take 1 tablet (300 mg total) by mouth daily. 04/01/19  Yes Sowles,  Drue Stager, MD  amLODipine (NORVASC) 5 MG tablet Take 1 tablet (5 mg total) by mouth daily. 08/11/18  Yes Dunn, Areta Haber, PA-C  aspirin 81 MG tablet Take 162 mg by mouth at bedtime.    Yes [provider]  atorvastatin (LIPITOR) 40 MG tablet Take 1 tablet (40 mg total) by mouth daily. 04/01/19  Yes Sowles, Drue Stager, MD  glipiZIDE (GLUCOTROL XL) 5 MG 24 hr tablet Take 1 tablet (5 mg total) by mouth daily with breakfast. 04/01/19  Yes Sowles, Drue Stager, MD  latanoprost (XALATAN) 0.005 % ophthalmic solution Place 1 drop into the left eye at bedtime.  06/18/15  Yes [provider]  metFORMIN (GLUCOPHAGE-XR) 500 MG 24 hr tablet Take 2 tablets (1,000 mg total) by mouth daily with breakfast. 04/01/19  Yes Sowles, Drue Stager, MD  niacin 100 MG tablet Take 100 mg by mouth at  bedtime.   Yes [provider]  Omega-3 Fatty Acids (FISH OIL PO) Take 1 capsule by mouth 2 (two) times daily.    Yes [provider]  polyethylene glycol (MIRALAX / GLYCOLAX) packet Take 17 g by mouth at bedtime.    Yes [provider]  sildenafil (VIAGRA) 100 MG tablet Take 0.5-1 tablets (50-100 mg total) by mouth daily as needed for erectile dysfunction. 07/30/18  Yes Sowles, Drue Stager, MD  spironolactone (ALDACTONE) 50 MG tablet Take 1 tablet (50 mg total) by mouth daily. 04/01/19  Yes Sowles, Drue Stager, MD  ACCU-CHEK FASTCLIX LANCETS MISC 1 each by Subdermal route 2 (two) times daily. 02/18/17   Steele Sizer, MD  ammonium lactate (AMLACTIN) 12 % cream Apply topically as needed for dry skin. Patient not taking: Reported on 04/20/2019 10/06/18   Steele Sizer, MD  Blood Glucose Monitoring Suppl (ACCU-CHEK AVIVA PLUS) w/Device KIT 1 each by Does not apply route 2 (two) times daily. Use as directed 04/01/19   Steele Sizer, MD  glucose blood (ACCU-CHEK AVIVA PLUS) test strip 1 each by Other route 2 (two) times daily as needed for other. Use as instructed 04/01/19   Steele Sizer, MD    Scheduled Meds: . amLODipine  5 mg Oral Daily  . atorvastatin  40 mg Oral q1800  . insulin aspart  0-9 Units Subcutaneous Q4H  . latanoprost  1 drop Left Eye QHS  . polyethylene glycol  17 g Oral QHS  . spironolactone  50 mg Oral Daily   Infusions:  PRN Meds: acetaminophen **OR** acetaminophen, ondansetron (ZOFRAN) IV   Allergies as of 04/20/2019 - Review Complete 04/20/2019  Allergen Reaction Noted  . Ace inhibitors Other (See Comments) 10/26/2015    Family History  Problem Relation Age of Onset  . Heart disease Father   . Hypertension Father   . Diabetes Sister   . Diabetes Brother   . Prostate cancer Brother   . Diabetes Sister   . Kidney disease Neg Hx   . Kidney cancer Neg Hx   . Bladder Cancer Neg Hx     Social History   Socioeconomic History  . Marital  status: Widowed    Spouse name: Not on file  . Number of children: 2  . Years of education: Not on file  . Highest education level: Some college, no degree  Occupational History  . Occupation: retired     Comment: used to [employer]   Tobacco Use  . Smoking status: Former Smoker    Packs/day: 1.00    Years: 10.00    Pack years: 10.00    Types: Cigarettes  Start date: 03/17/1965    Quit date: 11/27/1975    Years since quitting: 43.4  . Smokeless tobacco: Never Used  . Tobacco comment: quit 40 years  Substance and Sexual Activity  . Alcohol use: Yes    Alcohol/week: 1.0 standard drinks    Types: 1 Standard drinks or equivalent per week    Comment: socially - 1 x year  . Drug use: No  . Sexual activity: Yes    Partners: Female  Other Topics Concern  . Not on file  Social History Narrative   Lives by himself in [place]   Two grown children, daughter in [place] and son in [place]   He goes to [place] to visit his sisters often    Social Determinants of Health   Financial Resource Strain: Medium Risk  . Difficulty of Paying Living Expenses: Somewhat hard  Food Insecurity: No Food Insecurity  . Worried About Charity fundraiser in the Last Year: Never true  . Ran Out of Food in the Last Year: Never true  Transportation Needs: No Transportation Needs  . Lack of Transportation (Medical): No  . Lack of Transportation (Non-Medical): No  Physical Activity: Sufficiently Active  . Days of Exercise per Week: 5 days  . Minutes of Exercise per Session: 30 min  Stress: No Stress Concern Present  . Feeling of Stress : Not at all  Social Connections: Slightly Isolated  . Frequency of Communication with Friends and Family: More than three times a week  . Frequency of Social Gatherings with Friends and Family: More than three times a week  . Attends Religious Services: More than 4 times per year  . Active Member of Clubs or Organizations: Yes  . Attends Theatre manager Meetings: More than 4 times per year  . Marital Status: Widowed  Intimate Partner Violence: Not At Risk  . Fear of Current or Ex-Partner: No  . Emotionally Abused: No  . Physically Abused: No  . Sexually Abused: No    REVIEW OF SYSTEMS: Constitutional: Patient generally walks 30 minutes at least 5 days out of the week and walked yesterday.  He is legs get a little heavy when he walks. ENT:  No nose bleeds.  His multiple missing teeth do not present a problem with chewing or swallowing Pulm: No shortness of breath, no cough. CV:  No palpitations, no LE edema.  Leg heaviness with prolonged walking. GU: Experiences nocturia, frequency, incomplete voiding. GI: No dysphagia.  No heartburn, no indigestion.  Has a bowel movement twice a day generally. Heme: Other than the hematochezia, has not had any unusual or excessive bleeding or bruising. Transfusions: None. Neuro:  No headaches, no peripheral tingling or numbness Derm:  No itching, no rash or sores.  Endocrine:  No sweats or chills.  No polyuria or dysuria Immunization: Received Covid vaccine #1 but date of this is not clear. Travel:  None beyond local counties in last few months.    PHYSICAL EXAM: Vital signs in last 24 hours: Vitals:   04/20/19 2221 04/21/19 0519  BP: 138/74 119/77  Pulse: (!) 102 84  Resp: 18 16  Temp: 98.5 F (36.9 C) 98.2 F (36.8 C)  SpO2: 99% 98%   Wt Readings from Last 3 Encounters:  04/21/19 (!) 136.8 kg  04/01/19 (!) 139.3 kg  11/30/18 135.2 kg    General: Pleasant, comfortable, obese, alert.  Does not look acutely ill or in distress. Head: No facial asymmetry or swelling.  No signs of  head trauma. Eyes: Conjunctiva not pale.  No scleral icterus.  EOMI. Ears: No hearing deficit Nose: No congestion or discharge Mouth: Tongue midline.  Mucosa pink, moist, clear.  Many missing teeth.  Overall teeth are in subpar condition with some dental caries. Neck: No JVD, no masses, no  thyromegaly. Lungs: Clear bilaterally.  No labored breathing, no cough Heart: RRR are with 2/6 harsh quality systolic murmur consistent with his aortic stenosis.  S1, S2 clearly audible. Abdomen: Soft, obese, nontender.  Active bowel sounds.  Long midline well-healed scar consistent with his previous hernia repairs.  There is a muscular diastases with Valsalva in the upper midline region of the incision.   Rectal: Deferred. Musc/Skeltl: No joint redness, swelling or gross deformity. Extremities: No CCE. Neurologic: Oriented x3.  Good historian.  Moves all 4 limbs without obvious deficits, strength not tested.  No tremors. Skin: No suspicious lesions or rash. Nodes: No cervical adenopathy. Psych: Fluid speech, pleasant, calm.  Intake/Output from previous day: 02/03 0701 - 02/04 0700 In: 240 [P.O.:240] Out: 350 [Urine:350] Intake/Output this shift: Total I/O In: -  Out: 125 [Urine:125]  LAB RESULTS: Recent Labs    04/20/19 1723 04/21/19 0543  WBC 9.0 6.5  HGB 11.6* 10.0*  HCT 36.8* 31.6*  PLT 225 178   BMET Lab Results  Component Value Date   NA 135 04/21/2019   NA 136 04/20/2019   NA 137 11/30/2018   K 4.2 04/21/2019   K 4.4 04/20/2019   K 5.0 11/30/2018   CL 103 04/21/2019   CL 102 04/20/2019   CL 102 11/30/2018   CO2 25 04/21/2019   CO2 19 (L) 04/20/2019   CO2 26 11/30/2018   GLUCOSE 145 (H) 04/21/2019   GLUCOSE 210 (H) 04/20/2019   GLUCOSE 174 (H) 11/30/2018   BUN 26 (H) 04/21/2019   BUN 26 (H) 04/20/2019   BUN 18 11/30/2018   CREATININE 0.89 04/21/2019   CREATININE 1.29 (H) 04/20/2019   CREATININE 0.96 11/30/2018   CALCIUM 9.3 04/21/2019   CALCIUM 9.7 04/20/2019   CALCIUM 10.1 11/30/2018   LFT Recent Labs    04/20/19 1955 04/21/19 0543  PROT 7.4 7.0  ALBUMIN 2.9* 3.6  AST 161* 20  ALT 39 14  ALKPHOS 166* 47  BILITOT 7.6* 0.9  BILIDIR 4.2*  --   IBILI 3.4*  --    PT/INR Lab Results  Component Value Date   INR 1.3 (H) 04/20/2019   INR  >10.0 (HH) 04/20/2019   Hepatitis Panel Recent Labs    04/20/19 1800  HEPBSAG NON REACTIVE  HCVAB NON REACTIVE  HEPAIGM NON REACTIVE  HEPBIGM NON REACTIVE   C-Diff No components found for: CDIFF Lipase  No results found for: LIPASE  Drugs of Abuse  No results found for: LABOPIA, COCAINSCRNUR, LABBENZ, AMPHETMU, THCU, LABBARB   RADIOLOGY STUDIES: CT Abdomen Pelvis W Contrast  Result Date: 04/20/2019 CLINICAL DATA:  79 year old male with rectal bleeding. Concern for acute diverticulitis. EXAM: CT ABDOMEN AND PELVIS WITH CONTRAST TECHNIQUE: Multidetector CT imaging of the abdomen and pelvis was performed using the standard protocol following bolus administration of intravenous contrast. CONTRAST:  146m OMNIPAQUE IOHEXOL 300 MG/ML  SOLN COMPARISON:  CT of the abdomen pelvis dated 10/28/2012. FINDINGS: Lower chest: The visualized lung bases are clear. Three vessel coronary vascular calcification noted. No intra-abdominal free air or free fluid. Hepatobiliary: No focal liver abnormality is seen. No gallstones, gallbladder wall thickening, or biliary dilatation. Pancreas: Unremarkable. No pancreatic ductal dilatation or surrounding inflammatory  changes. Spleen: Normal in size without focal abnormality. Adrenals/Urinary Tract: The adrenal glands are unremarkable. There is a horseshoe kidney. Several renal cysts measuring up to 3.5 cm in the inferior pole of the right kidney. Multiple additional subcentimeter hypodense lesions are too small to characterize. There is mild fullness of the left renal collecting system. No hydronephrosis on right. There is symmetric enhancement and excretion of contrast by both kidneys. The visualized ureters appear unremarkable. There is a small anterior bladder dome diverticula. Stomach/Bowel: There is severe sigmoid diverticulosis without active inflammatory changes. Additional scattered colonic diverticula noted. There is no bowel obstruction or active inflammation. The  appendix is normal. Postsurgical changes of the bowel with anastomotic sutures noted. Vascular/Lymphatic: Advanced aortoiliac atherosclerotic disease. The IVC is unremarkable. No portal venous gas. There is no adenopathy. Reproductive: Enlarged prostate gland measuring approximately 5.5 cm in transverse axial diameter. Other: Postsurgical changes of ventral hernia repair. A chronic appearing collection in the midline subcutaneous soft tissues of the anterior abdominal wall along the hernia repair measures approximately 7.5 x 2.0 cm in greatest axial dimensions and 11 cm in craniocaudal length. Musculoskeletal: Multilevel degenerative changes of the spine. No acute osseous pathology. IMPRESSION: 1. No acute intra-abdominal or pelvic pathology. 2. Severe sigmoid and diffuse colonic diverticulosis. No bowel obstruction or active inflammation. Normal appendix. No definite evidence of active diverticular bleed by CT. 3. 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 4. 3 vessel coronary vascular calcification and advanced Aortic Atherosclerosis (ICD10-I70.0). Electronically Signed   By: Anner Crete M.D.   On: 04/20/2019 19:55    IMPRESSION:   *   Painless hematochezia.  Suspect diverticular bleed.    *   Erroneous labs initially reporting with significant coagulopathy and elevated LFTs but these are all normal on recheck.  Interestingly his CBC parameters seem to be as expected.  *   Anemia, presumed due to blood loss.  MCV normal.  *   DM2/NIDDM  *   Morbid obesity.    *   Ao stenosis.  Aortic and coronary vascular calcification per CT.      PLAN:     *    Colonoscopy is set for 730 tomorrow morning 04/22/2019. Clear liquids today.  Prep with Dulcolax, movie prep, Reglan. Risks, benefits, technicalities of colonoscopy discussed with patient and he is agreeable to proceed.  *   Hgb/crit at 5 PM tonight and 5 AM tomorrow  Azucena Freed  04/21/2019, 9:00 AM Phone [phone]

## 2019-04-21 NOTE — Consult Note (Signed)
[facility] Consult: 9:00 AM 04/21/2019  LOS: 0 days    Referring Provider: Dr Georgena Spurling  Primary Care Physician:  Steele Sizer, MD in [place].   Primary Gastroenterologist: unassigned    Reason for Consultation:  Hematochezia.     HPI: Jordan [name] is a 79 y.o. male.  PMH DM 2.  HTN.  Obesity.  HLD.  Previous smoker.  Heart murmur, aortic valve stenosis; 08/2018 echo: LVEF 60-65%.  Nml R systolic function.  Severe AoV calcification w moderate  AoV stenosis.  CKD 2.  Constipation.  BPH.  Phimosis 2018.  Erectile dysfunction.  Glaucoma.  Onychomycosis.  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 Several repairs of ventral/umbilical hernias, the latest was 2011 in [place]..  2012 colonoscopy in [place].  Patient does not recall history of colon polyps and not aware of diagnosis of diverticulosis.  Received Covid vaccination #1. Wt 291 #/132.2kg in 08/2016.  298 #/135.2 kg 11/2018  Painless hematochezia on the morning of 2/1.  Recurrent, large volume bleeding 2/3 in the afternoon.  Again no abdominal pain, nausea, vomiting.  Some lightheadedness, no syncope.  She takes to low-dose aspirin daily.  In the past he took Bayer arthritis formula but has not used this in at least 6 weeks.  Hb 14.2 in mid 11/2018, 11.6 on 2/3 >> 10 this morning 2/4.  WBCs, platelets, MCV normal. Initial PT/INR >90/> 10.  But 15.9/1.3 on recheck 2 hours later. Initial T bili 7.6, alkaline phosphatase 166, AST/ALT 161-3.9, on recheck this morning these are all normal. CTAP with contrast: Severe diverticulosis of sigmoid and diffuse pan colonic diverticulosis.  No active bleeding.  Post surgical changes at ventral hernia repair line/ant abd wall with chronic midline SQ soft tissues (7.5 x 2 x 11 cm).  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  Three-vessel coronary vascular calcification and advanced aortic atherosclerosis.  Family history  Brother with prostate cancer  Social history Retired as a Curator for [employer].  He is widowed.  Has a sister who lives in [place].  He drinks beer on rare occasions.     Past Medical History:  Diagnosis Date  . Aortic stenosis   . Chronic kidney disease, stage II (mild)   . Decreased libido   . Diabetes mellitus without complication ([place])   . Glaucoma   . Gout 2009  . Hernia 6979,4801  . Hyperlipidemia   . Hypertension   . Inguinal hernia without mention of obstruction or gangrene, unilateral or unspecified, (not specified as recurrent)   . Lumbago   . Obesity   . Unspecified constipation     Past Surgical History:  Procedure Laterality Date  . cataract surgery    . COLONOSCOPY  2012  . EYE SURGERY  2011   cataract  . UMBILICAL HERNIA REPAIR  2011    Prior to Admission medications   Medication Sig Start Date End Date Taking? Authorizing Provider  allopurinol (ZYLOPRIM) 300 MG tablet Take 1 tablet (300 mg total) by mouth daily. 04/01/19  Yes Sowles,  Drue Stager, MD  amLODipine (NORVASC) 5 MG tablet Take 1 tablet (5 mg total) by mouth daily. 08/11/18  Yes Dunn, Areta Haber, PA-C  aspirin 81 MG tablet Take 162 mg by mouth at bedtime.    Yes [provider]  atorvastatin (LIPITOR) 40 MG tablet Take 1 tablet (40 mg total) by mouth daily. 04/01/19  Yes Sowles, Drue Stager, MD  glipiZIDE (GLUCOTROL XL) 5 MG 24 hr tablet Take 1 tablet (5 mg total) by mouth daily with breakfast. 04/01/19  Yes Sowles, Drue Stager, MD  latanoprost (XALATAN) 0.005 % ophthalmic solution Place 1 drop into the left eye at bedtime.  06/18/15  Yes [provider]  metFORMIN (GLUCOPHAGE-XR) 500 MG 24 hr tablet Take 2 tablets (1,000 mg total) by mouth daily with breakfast. 04/01/19  Yes Sowles, Drue Stager, MD  niacin 100 MG tablet Take 100 mg by mouth at  bedtime.   Yes [provider]  Omega-3 Fatty Acids (FISH OIL PO) Take 1 capsule by mouth 2 (two) times daily.    Yes [provider]  polyethylene glycol (MIRALAX / GLYCOLAX) packet Take 17 g by mouth at bedtime.    Yes [provider]  sildenafil (VIAGRA) 100 MG tablet Take 0.5-1 tablets (50-100 mg total) by mouth daily as needed for erectile dysfunction. 07/30/18  Yes Sowles, Drue Stager, MD  spironolactone (ALDACTONE) 50 MG tablet Take 1 tablet (50 mg total) by mouth daily. 04/01/19  Yes Sowles, Drue Stager, MD  ACCU-CHEK FASTCLIX LANCETS MISC 1 each by Subdermal route 2 (two) times daily. 02/18/17   Steele Sizer, MD  ammonium lactate (AMLACTIN) 12 % cream Apply topically as needed for dry skin. Patient not taking: Reported on 04/20/2019 10/06/18   Steele Sizer, MD  Blood Glucose Monitoring Suppl (ACCU-CHEK AVIVA PLUS) w/Device KIT 1 each by Does not apply route 2 (two) times daily. Use as directed 04/01/19   Steele Sizer, MD  glucose blood (ACCU-CHEK AVIVA PLUS) test strip 1 each by Other route 2 (two) times daily as needed for other. Use as instructed 04/01/19   Steele Sizer, MD    Scheduled Meds: . amLODipine  5 mg Oral Daily  . atorvastatin  40 mg Oral q1800  . insulin aspart  0-9 Units Subcutaneous Q4H  . latanoprost  1 drop Left Eye QHS  . polyethylene glycol  17 g Oral QHS  . spironolactone  50 mg Oral Daily   Infusions:  PRN Meds: acetaminophen **OR** acetaminophen, ondansetron (ZOFRAN) IV   Allergies as of 04/20/2019 - Review Complete 04/20/2019  Allergen Reaction Noted  . Ace inhibitors Other (See Comments) 10/26/2015    Family History  Problem Relation Age of Onset  . Heart disease Father   . Hypertension Father   . Diabetes Sister   . Diabetes Brother   . Prostate cancer Brother   . Diabetes Sister   . Kidney disease Neg Hx   . Kidney cancer Neg Hx   . Bladder Cancer Neg Hx     Social History   Socioeconomic History  . Marital  status: Widowed    Spouse name: Not on file  . Number of children: 2  . Years of education: Not on file  . Highest education level: Some college, no degree  Occupational History  . Occupation: retired     Comment: used to [employer]   Tobacco Use  . Smoking status: Former Smoker    Packs/day: 1.00    Years: 10.00    Pack years: 10.00    Types: Cigarettes  Start date: 03/17/1965    Quit date: 11/27/1975    Years since quitting: 43.4  . Smokeless tobacco: Never Used  . Tobacco comment: quit 40 years  Substance and Sexual Activity  . Alcohol use: Yes    Alcohol/week: 1.0 standard drinks    Types: 1 Standard drinks or equivalent per week    Comment: socially - 1 x year  . Drug use: No  . Sexual activity: Yes    Partners: Female  Other Topics Concern  . Not on file  Social History Narrative   Lives by himself in [place]   Two grown children, daughter in [place] and son in [place]   He goes to [place] to visit his sisters often    Social Determinants of Health   Financial Resource Strain: Medium Risk  . Difficulty of Paying Living Expenses: Somewhat hard  Food Insecurity: No Food Insecurity  . Worried About Charity fundraiser in the Last Year: Never true  . Ran Out of Food in the Last Year: Never true  Transportation Needs: No Transportation Needs  . Lack of Transportation (Medical): No  . Lack of Transportation (Non-Medical): No  Physical Activity: Sufficiently Active  . Days of Exercise per Week: 5 days  . Minutes of Exercise per Session: 30 min  Stress: No Stress Concern Present  . Feeling of Stress : Not at all  Social Connections: Slightly Isolated  . Frequency of Communication with Friends and Family: More than three times a week  . Frequency of Social Gatherings with Friends and Family: More than three times a week  . Attends Religious Services: More than 4 times per year  . Active Member of Clubs or Organizations: Yes  . Attends Theatre manager Meetings: More than 4 times per year  . Marital Status: Widowed  Intimate Partner Violence: Not At Risk  . Fear of Current or Ex-Partner: No  . Emotionally Abused: No  . Physically Abused: No  . Sexually Abused: No    REVIEW OF SYSTEMS: Constitutional: Patient generally walks 30 minutes at least 5 days out of the week and walked yesterday.  He is legs get a little heavy when he walks. ENT:  No nose bleeds.  His multiple missing teeth do not present a problem with chewing or swallowing Pulm: No shortness of breath, no cough. CV:  No palpitations, no LE edema.  Leg heaviness with prolonged walking. GU: Experiences nocturia, frequency, incomplete voiding. GI: No dysphagia.  No heartburn, no indigestion.  Has a bowel movement twice a day generally. Heme: Other than the hematochezia, has not had any unusual or excessive bleeding or bruising. Transfusions: None. Neuro:  No headaches, no peripheral tingling or numbness Derm:  No itching, no rash or sores.  Endocrine:  No sweats or chills.  No polyuria or dysuria Immunization: Received Covid vaccine #1 but date of this is not clear. Travel:  None beyond local counties in last few months.    PHYSICAL EXAM: Vital signs in last 24 hours: Vitals:   04/20/19 2221 04/21/19 0519  BP: 138/74 119/77  Pulse: (!) 102 84  Resp: 18 16  Temp: 98.5 F (36.9 C) 98.2 F (36.8 C)  SpO2: 99% 98%   Wt Readings from Last 3 Encounters:  04/21/19 (!) 136.8 kg  04/01/19 (!) 139.3 kg  11/30/18 135.2 kg    General: Pleasant, comfortable, obese, alert.  Does not look acutely ill or in distress. Head: No facial asymmetry or swelling.  No signs of  head trauma. Eyes: Conjunctiva not pale.  No scleral icterus.  EOMI. Ears: No hearing deficit Nose: No congestion or discharge Mouth: Tongue midline.  Mucosa pink, moist, clear.  Many missing teeth.  Overall teeth are in subpar condition with some dental caries. Neck: No JVD, no masses, no  thyromegaly. Lungs: Clear bilaterally.  No labored breathing, no cough Heart: RRR are with 2/6 harsh quality systolic murmur consistent with his aortic stenosis.  S1, S2 clearly audible. Abdomen: Soft, obese, nontender.  Active bowel sounds.  Long midline well-healed scar consistent with his previous hernia repairs.  There is a muscular diastases with Valsalva in the upper midline region of the incision.   Rectal: Deferred. Musc/Skeltl: No joint redness, swelling or gross deformity. Extremities: No CCE. Neurologic: Oriented x3.  Good historian.  Moves all 4 limbs without obvious deficits, strength not tested.  No tremors. Skin: No suspicious lesions or rash. Nodes: No cervical adenopathy. Psych: Fluid speech, pleasant, calm.  Intake/Output from previous day: 02/03 0701 - 02/04 0700 In: 240 [P.O.:240] Out: 350 [Urine:350] Intake/Output this shift: Total I/O In: -  Out: 125 [Urine:125]  LAB RESULTS: Recent Labs    04/20/19 1723 04/21/19 0543  WBC 9.0 6.5  HGB 11.6* 10.0*  HCT 36.8* 31.6*  PLT 225 178   BMET Lab Results  Component Value Date   NA 135 04/21/2019   NA 136 04/20/2019   NA 137 11/30/2018   K 4.2 04/21/2019   K 4.4 04/20/2019   K 5.0 11/30/2018   CL 103 04/21/2019   CL 102 04/20/2019   CL 102 11/30/2018   CO2 25 04/21/2019   CO2 19 (L) 04/20/2019   CO2 26 11/30/2018   GLUCOSE 145 (H) 04/21/2019   GLUCOSE 210 (H) 04/20/2019   GLUCOSE 174 (H) 11/30/2018   BUN 26 (H) 04/21/2019   BUN 26 (H) 04/20/2019   BUN 18 11/30/2018   CREATININE 0.89 04/21/2019   CREATININE 1.29 (H) 04/20/2019   CREATININE 0.96 11/30/2018   CALCIUM 9.3 04/21/2019   CALCIUM 9.7 04/20/2019   CALCIUM 10.1 11/30/2018   LFT Recent Labs    04/20/19 1955 04/21/19 0543  PROT 7.4 7.0  ALBUMIN 2.9* 3.6  AST 161* 20  ALT 39 14  ALKPHOS 166* 47  BILITOT 7.6* 0.9  BILIDIR 4.2*  --   IBILI 3.4*  --    PT/INR Lab Results  Component Value Date   INR 1.3 (H) 04/20/2019   INR  >10.0 (HH) 04/20/2019   Hepatitis Panel Recent Labs    04/20/19 1800  HEPBSAG NON REACTIVE  HCVAB NON REACTIVE  HEPAIGM NON REACTIVE  HEPBIGM NON REACTIVE   C-Diff No components found for: CDIFF Lipase  No results found for: LIPASE  Drugs of Abuse  No results found for: LABOPIA, COCAINSCRNUR, LABBENZ, AMPHETMU, THCU, LABBARB   RADIOLOGY STUDIES: CT Abdomen Pelvis W Contrast  Result Date: 04/20/2019 CLINICAL DATA:  79 year old male with rectal bleeding. Concern for acute diverticulitis. EXAM: CT ABDOMEN AND PELVIS WITH CONTRAST TECHNIQUE: Multidetector CT imaging of the abdomen and pelvis was performed using the standard protocol following bolus administration of intravenous contrast. CONTRAST:  145m OMNIPAQUE IOHEXOL 300 MG/ML  SOLN COMPARISON:  CT of the abdomen pelvis dated 10/28/2012. FINDINGS: Lower chest: The visualized lung bases are clear. Three vessel coronary vascular calcification noted. No intra-abdominal free air or free fluid. Hepatobiliary: No focal liver abnormality is seen. No gallstones, gallbladder wall thickening, or biliary dilatation. Pancreas: Unremarkable. No pancreatic ductal dilatation or surrounding inflammatory  changes. Spleen: Normal in size without focal abnormality. Adrenals/Urinary Tract: The adrenal glands are unremarkable. There is a horseshoe kidney. Several renal cysts measuring up to 3.5 cm in the inferior pole of the right kidney. Multiple additional subcentimeter hypodense lesions are too small to characterize. There is mild fullness of the left renal collecting system. No hydronephrosis on right. There is symmetric enhancement and excretion of contrast by both kidneys. The visualized ureters appear unremarkable. There is a small anterior bladder dome diverticula. Stomach/Bowel: There is severe sigmoid diverticulosis without active inflammatory changes. Additional scattered colonic diverticula noted. There is no bowel obstruction or active inflammation. The  appendix is normal. Postsurgical changes of the bowel with anastomotic sutures noted. Vascular/Lymphatic: Advanced aortoiliac atherosclerotic disease. The IVC is unremarkable. No portal venous gas. There is no adenopathy. Reproductive: Enlarged prostate gland measuring approximately 5.5 cm in transverse axial diameter. Other: Postsurgical changes of ventral hernia repair. A chronic appearing collection in the midline subcutaneous soft tissues of the anterior abdominal wall along the hernia repair measures approximately 7.5 x 2.0 cm in greatest axial dimensions and 11 cm in craniocaudal length. Musculoskeletal: Multilevel degenerative changes of the spine. No acute osseous pathology. IMPRESSION: 1. No acute intra-abdominal or pelvic pathology. 2. Severe sigmoid and diffuse colonic diverticulosis. No bowel obstruction or active inflammation. Normal appendix. No definite evidence of active diverticular bleed by CT. 3. 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 4. 3 vessel coronary vascular calcification and advanced Aortic Atherosclerosis (ICD10-I70.0). Electronically Signed   By: Anner Crete M.D.   On: 04/20/2019 19:55    IMPRESSION:   *   Painless hematochezia.  Suspect diverticular bleed.    *   Erroneous labs initially reporting with significant coagulopathy and elevated LFTs but these are all normal on recheck.  Interestingly his CBC parameters seem to be as expected.  *   Anemia, presumed due to blood loss.  MCV normal.  *   DM2/NIDDM  *   Morbid obesity.    *   Ao stenosis.  Aortic and coronary vascular calcification per CT.      PLAN:     *    Colonoscopy is set for 730 tomorrow morning 04/22/2019. Clear liquids today.  Prep with Dulcolax, movie prep, Reglan. Risks, benefits, technicalities of colonoscopy discussed with patient and he is agreeable to proceed.  *   Hgb/crit at 5 PM tonight and 5 AM tomorrow  Azucena Freed  04/21/2019, 9:00 AM Phone [phone]

## 2019-04-21 NOTE — Progress Notes (Signed)
Pt would like brother notified of any results from procedure tomorrow. Karle Starch # [phone]

## 2019-04-21 NOTE — Anesthesia Preprocedure Evaluation (Addendum)
Anesthesia Evaluation  Patient identified by MRN, date of birth, ID band Patient awake    Reviewed: Allergy & Precautions, NPO status , Patient's Chart, lab work & pertinent test results  History of Anesthesia Complications Negative for: history of anesthetic complications  Airway Mallampati: II  TM Distance: >3 FB Neck ROM: Full    Dental  (+) Poor Dentition, Missing, Chipped, Dental Advisory Given   Pulmonary former smoker (quit 1977),  04/20/2019 SARS coronavirus neg   breath sounds clear to auscultation       Cardiovascular hypertension, negative cardio ROS   Rhythm:Regular Rate:Normal     Neuro/Psych negative neurological ROS  negative psych ROS   GI/Hepatic negative GI ROS, Neg liver ROS,   Endo/Other  diabetes (glu 150), Oral Hypoglycemic AgentsMorbid obesity  Renal/GU negative Renal ROS     Musculoskeletal   Abdominal (+) + obese,   Peds  Hematology negative hematology ROS (+)   Anesthesia Other Findings   Reproductive/Obstetrics                           Anesthesia Physical Anesthesia Plan  ASA: III  Anesthesia Plan: MAC   Post-op Pain Management:    Induction:   PONV Risk Score and Plan: 1 and Treatment may vary due to age or medical condition  Airway Management Planned: Natural Airway and Simple Face Mask  Additional Equipment:   Intra-op Plan:   Post-operative Plan:   Informed Consent: I have reviewed the patients History and Physical, chart, labs and discussed the procedure including the risks, benefits and alternatives for the proposed anesthesia with the patient or authorized representative who has indicated his/her understanding and acceptance.     Dental advisory given  Plan Discussed with: CRNA and Surgeon  Anesthesia Plan Comments:        Anesthesia Quick Evaluation

## 2019-04-21 NOTE — Progress Notes (Signed)
PROGRESS NOTE    Jordan [name]  LTJ:[id number] DOB: 1940-12-03 DOA: 04/20/2019 PCP: Steele Sizer, MD   Brief Narrative: Jordan [name] is a 79 y.o. male with a history of hypertension, hyperlipdemia, Dm2, CKD stage2, Gout, chronic back pain,  Prior ventral hernia repair,  diverticulosis. Patient presented secondary to rectal bleeding.   Assessment & Plan:   Principal Problem:   Rectal bleeding Active Problems:   Controlled type 2 diabetes mellitus with microalbuminuria (HCC)   Abnormal liver function   Rectal bleeding Patient with bright red blood per rectum.  No episodes while inpatient.  GI consulted on admission with plans for colonoscopy on 2/5.  Hemoglobin is down from admission. -GI recommendations: Colonoscopy planned for 2/5 -Serial CBC/H&H -Watch for recurrent hematochezia  Elevated alkaline phosphatase/AST/bilirubin Unsure of etiology.  CT scan on admission without evidence of hepatobiliary biliary disease biliary disease; no gallstones seen in the gallbladder.  Bilirubin, alkaline phosphatase, AST now back to baseline.  It is possible the initial labs were erroneous. Patient asymptomatic.  Diabetes mellitus, type 2 Patient is on Glucotrol XL 5 mg daily, Glucophage XR 1000 mg daily.  Essential hypertension Patient is on amlodipine 5 mg daily and spironolactone 50 mg daily as an outpatient.  Patient is also on aspirin 162 mg daily. -Continue amlodipine and spironolactone -Hold aspirin  Hyperlipidemia Patient is on Lipitor 40 mg daily and omega-3 fatty acids twice daily -Continue Lipitor  Gout Patient is on allopurinol 300 mg daily as an outpatient.  Morbid obesity Body mass index is 44.54 kg/m.   DVT prophylaxis: SCDs secondary to GI bleeding Code Status:   Code Status: Full Code Family Communication: None Disposition Plan: Plan for discharge home pending GI management/work-up   Consultants:   [facility]  Procedures:    None  Antimicrobials:  None   Subjective: Patient reports no recurrent hematochezia.  No abdominal pain.  Objective: Vitals:   04/20/19 2129 04/20/19 2221 04/21/19 0444 04/21/19 0519  BP: (!) 144/81 138/74  119/77  Pulse: 94 (!) 102  84  Resp: _0 Temp:  98.5 F (36.9 C)  98.2 F (36.8 C)  TempSrc:  Oral  Oral  SpO2: 98% 99%  98%  Weight:   (!) 136.8 kg   Height:        Intake/Output Summary (Last 24 hours) at 04/21/2019 1409 Last data filed at 04/21/2019 1125 Gross per 24 hour  Intake 240 ml  Output 625 ml  Net -385 ml   Filed Weights   04/20/19 1702 04/20/19 1713 04/21/19 0444  Weight: (!) 139 kg (!) 140.6 kg (!) 136.8 kg    Examination:  General exam: Appears calm and comfortable Respiratory system: Clear to auscultation. Respiratory effort normal. Cardiovascular system: S1 & S2 heard, RRR.  2/6 systolic murmur. Gastrointestinal system: Abdomen is nondistended, soft and nontender. No organomegaly or masses felt. Normal bowel sounds heard. Central nervous system: Alert and oriented. No focal neurological deficits. Extremities: No edema. No calf tenderness Skin: No cyanosis. No rashes Psychiatry: Judgement and insight appear normal. Mood & affect appropriate.    Data Reviewed: I have personally reviewed following labs and imaging studies  CBC: Recent Labs  Lab 04/20/19 1723 04/21/19 0543  WBC 9.0 6.5  NEUTROABS 6.8  --   HGB 11.6* 10.0*  HCT 36.8* 31.6*  MCV 93.4 92.4  PLT 225 076   Basic Metabolic Panel: Recent Labs  Lab 04/20/19 1723 04/21/19 0543  NA 136 135  K 4.4 4.2  CL 102 103  CO2 19* 25  GLUCOSE 210* 145*  BUN 26* 26*  CREATININE 1.29* 0.89  CALCIUM 9.7 9.3   GFR: Estimated Creatinine Clearance: 95.5 mL/min (by C-G formula based on SCr of 0.89 mg/dL). Liver Function Tests: Recent Labs  Lab 04/20/19 1955 04/21/19 0543  AST 161* 20  ALT 39 14  ALKPHOS 166* 47  BILITOT 7.6* 0.9  PROT 7.4 7.0  ALBUMIN 2.9* 3.6    No results for input(s): LIPASE, AMYLASE in the last 168 hours. No results for input(s): AMMONIA in the last 168 hours. Coagulation Profile: Recent Labs  Lab 04/20/19 1723 04/20/19 1955  INR >10.0* 1.3*   Cardiac Enzymes: No results for input(s): CKTOTAL, CKMB, CKMBINDEX, TROPONINI in the last 168 hours. BNP (last 3 results) No results for input(s): PROBNP in the last 8760 hours. HbA1C: No results for input(s): HGBA1C in the last 72 hours. CBG: Recent Labs  Lab 04/20/19 2346 04/21/19 0436 04/21/19 0759 04/21/19 1233  GLUCAP 134* 133* 138* 203*   Lipid Profile: No results for input(s): CHOL, HDL, LDLCALC, TRIG, CHOLHDL, LDLDIRECT in the last 72 hours. Thyroid Function Tests: No results for input(s): TSH, T4TOTAL, FREET4, T3FREE, THYROIDAB in the last 72 hours. Anemia Panel: No results for input(s): VITAMINB12, FOLATE, FERRITIN, TIBC, IRON, RETICCTPCT in the last 72 hours. Sepsis Labs: No results for input(s): PROCALCITON, LATICACIDVEN in the last 168 hours.  Recent Results (from the past 240 hour(s))  SARS CORONAVIRUS 2 (TAT 6-24 HRS) Nasopharyngeal Nasopharyngeal Swab     Status: None   Collection Time: 04/20/19  9:07 PM   Specimen: Nasopharyngeal Swab  Result Value Ref Range Status   SARS Coronavirus 2 NEGATIVE NEGATIVE Final    Comment: (NOTE) SARS-CoV-2 target nucleic acids are NOT DETECTED. The SARS-CoV-2 RNA is generally detectable in upper and lower respiratory specimens during the acute phase of infection. Negative results do not preclude SARS-CoV-2 infection, do not rule out co-infections with other pathogens, and should not be used as the sole basis for treatment or other patient management decisions. Negative results must be combined with clinical observations, patient history, and epidemiological information. The expected result is Negative. Fact Sheet for Patients: SugarRoll.be Fact Sheet for Healthcare  Providers: https://www.woods-mathews.com/ This test is not yet approved or cleared by the [place] FDA and  has been authorized for detection and/or diagnosis of SARS-CoV-2 by FDA under an Emergency Use Authorization (EUA). This EUA will remain  in effect (meaning this test can be used) for the duration of the COVID-19 declaration under Section 56 4(b)(1) of the Act, 21 U.S.C. section 360bbb-3(b)(1), unless the authorization is terminated or revoked sooner. Performed at [facility], [place] [address], [place], [place] [address]          Radiology Studies: CT Abdomen Pelvis W Contrast  Result Date: 04/20/2019 CLINICAL DATA:  79 year old male with rectal bleeding. Concern for acute diverticulitis. EXAM: CT ABDOMEN AND PELVIS WITH CONTRAST TECHNIQUE: Multidetector CT imaging of the abdomen and pelvis was performed using the standard protocol following bolus administration of intravenous contrast. CONTRAST:  170m OMNIPAQUE IOHEXOL 300 MG/ML  SOLN COMPARISON:  CT of the abdomen pelvis dated 10/28/2012. FINDINGS: Lower chest: The visualized lung bases are clear. Three vessel coronary vascular calcification noted. No intra-abdominal free air or free fluid. Hepatobiliary: No focal liver abnormality is seen. No gallstones, gallbladder wall thickening, or biliary dilatation. Pancreas: Unremarkable. No pancreatic ductal dilatation or surrounding inflammatory changes. Spleen: Normal in size without focal abnormality. Adrenals/Urinary Tract:  The adrenal glands are unremarkable. There is a horseshoe kidney. Several renal cysts measuring up to 3.5 cm in the inferior pole of the right kidney. Multiple additional subcentimeter hypodense lesions are too small to characterize. There is mild fullness of the left renal collecting system. No hydronephrosis on right. There is symmetric enhancement and excretion of contrast by both kidneys. The visualized ureters appear unremarkable. There is a  small anterior bladder dome diverticula. Stomach/Bowel: There is severe sigmoid diverticulosis without active inflammatory changes. Additional scattered colonic diverticula noted. There is no bowel obstruction or active inflammation. The appendix is normal. Postsurgical changes of the bowel with anastomotic sutures noted. Vascular/Lymphatic: Advanced aortoiliac atherosclerotic disease. The IVC is unremarkable. No portal venous gas. There is no adenopathy. Reproductive: Enlarged prostate gland measuring approximately 5.5 cm in transverse axial diameter. Other: Postsurgical changes of ventral hernia repair. A chronic appearing collection in the midline subcutaneous soft tissues of the anterior abdominal wall along the hernia repair measures approximately 7.5 x 2.0 cm in greatest axial dimensions and 11 cm in craniocaudal length. Musculoskeletal: Multilevel degenerative changes of the spine. No acute osseous pathology. IMPRESSION: 1. No acute intra-abdominal or pelvic pathology. 2. Severe sigmoid and diffuse colonic diverticulosis. No bowel obstruction or active inflammation. Normal appendix. No definite evidence of active diverticular bleed by CT. 3. 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 4. 3 vessel coronary vascular calcification and advanced Aortic Atherosclerosis (ICD10-I70.0). Electronically Signed   By: Anner Crete M.D.   On: 04/20/2019 19:55        Scheduled Meds: . amLODipine  5 mg Oral Daily  . atorvastatin  40 mg Oral q1800  . insulin aspart  0-9 Units Subcutaneous Q4H  . latanoprost  1 drop Left Eye QHS  . metoCLOPramide (REGLAN) injection  10 mg Intravenous Once   Followed by  . metoCLOPramide (REGLAN) injection  10 mg Intravenous Once  . peg 3350 powder  0.5 kit Oral 2 times per day on Thu Fri  . spironolactone  50 mg Oral Daily   Continuous Infusions:   LOS: 0 days     Cordelia Poche, MD [facility] 04/21/2019, 2:09 PM  If 7PM-7AM,  please contact night-coverage [email or web address]

## 2019-04-22 ENCOUNTER — Encounter (HOSPITAL_COMMUNITY)
Admission: EM | Disposition: A | Discharge status: Home / Self Care | Payer: Self-pay | Source: Home / Self Care | Attending: Family Medicine

## 2019-04-22 ENCOUNTER — Encounter (HOSPITAL_COMMUNITY): Payer: Self-pay | Admitting: Internal Medicine

## 2019-04-22 ENCOUNTER — Observation Stay (HOSPITAL_COMMUNITY): Payer: Medicare HMO | Admitting: Anesthesiology

## 2019-04-22 ENCOUNTER — Telehealth: Payer: Self-pay

## 2019-04-22 DIAGNOSIS — Z87891 Personal history of nicotine dependence: Secondary | ICD-10-CM | POA: Diagnosis not present

## 2019-04-22 DIAGNOSIS — K625 Hemorrhage of anus and rectum: Secondary | ICD-10-CM | POA: Diagnosis present

## 2019-04-22 DIAGNOSIS — Z833 Family history of diabetes mellitus: Secondary | ICD-10-CM | POA: Diagnosis not present

## 2019-04-22 DIAGNOSIS — I350 Nonrheumatic aortic (valve) stenosis: Secondary | ICD-10-CM | POA: Diagnosis present

## 2019-04-22 DIAGNOSIS — Z7982 Long term (current) use of aspirin: Secondary | ICD-10-CM | POA: Diagnosis not present

## 2019-04-22 DIAGNOSIS — D62 Acute posthemorrhagic anemia: Secondary | ICD-10-CM | POA: Diagnosis present

## 2019-04-22 DIAGNOSIS — N182 Chronic kidney disease, stage 2 (mild): Secondary | ICD-10-CM | POA: Diagnosis present

## 2019-04-22 DIAGNOSIS — Q438 Other specified congenital malformations of intestine: Secondary | ICD-10-CM | POA: Diagnosis not present

## 2019-04-22 DIAGNOSIS — M109 Gout, unspecified: Secondary | ICD-10-CM | POA: Diagnosis present

## 2019-04-22 DIAGNOSIS — E1122 Type 2 diabetes mellitus with diabetic chronic kidney disease: Secondary | ICD-10-CM | POA: Diagnosis present

## 2019-04-22 DIAGNOSIS — R945 Abnormal results of liver function studies: Secondary | ICD-10-CM | POA: Diagnosis not present

## 2019-04-22 DIAGNOSIS — K921 Melena: Secondary | ICD-10-CM

## 2019-04-22 DIAGNOSIS — R809 Proteinuria, unspecified: Secondary | ICD-10-CM | POA: Diagnosis not present

## 2019-04-22 DIAGNOSIS — N400 Enlarged prostate without lower urinary tract symptoms: Secondary | ICD-10-CM | POA: Diagnosis present

## 2019-04-22 DIAGNOSIS — Z79899 Other long term (current) drug therapy: Secondary | ICD-10-CM | POA: Diagnosis not present

## 2019-04-22 DIAGNOSIS — I129 Hypertensive chronic kidney disease with stage 1 through stage 4 chronic kidney disease, or unspecified chronic kidney disease: Secondary | ICD-10-CM | POA: Diagnosis present

## 2019-04-22 DIAGNOSIS — K648 Other hemorrhoids: Secondary | ICD-10-CM | POA: Diagnosis present

## 2019-04-22 DIAGNOSIS — K029 Dental caries, unspecified: Secondary | ICD-10-CM | POA: Diagnosis present

## 2019-04-22 DIAGNOSIS — Z6841 Body Mass Index (BMI) 40.0 and over, adult: Secondary | ICD-10-CM | POA: Diagnosis not present

## 2019-04-22 DIAGNOSIS — H409 Unspecified glaucoma: Secondary | ICD-10-CM | POA: Diagnosis present

## 2019-04-22 DIAGNOSIS — K5731 Diverticulosis of large intestine without perforation or abscess with bleeding: Secondary | ICD-10-CM | POA: Diagnosis present

## 2019-04-22 DIAGNOSIS — Z8719 Personal history of other diseases of the digestive system: Secondary | ICD-10-CM | POA: Diagnosis not present

## 2019-04-22 DIAGNOSIS — E1129 Type 2 diabetes mellitus with other diabetic kidney complication: Secondary | ICD-10-CM | POA: Diagnosis not present

## 2019-04-22 DIAGNOSIS — Z20822 Contact with and (suspected) exposure to covid-19: Secondary | ICD-10-CM | POA: Diagnosis present

## 2019-04-22 DIAGNOSIS — E785 Hyperlipidemia, unspecified: Secondary | ICD-10-CM | POA: Diagnosis present

## 2019-04-22 DIAGNOSIS — G8929 Other chronic pain: Secondary | ICD-10-CM | POA: Diagnosis present

## 2019-04-22 DIAGNOSIS — Z7984 Long term (current) use of oral hypoglycemic drugs: Secondary | ICD-10-CM | POA: Diagnosis not present

## 2019-04-22 HISTORY — PX: COLONOSCOPY: SHX5424

## 2019-04-22 LAB — GLUCOSE, CAPILLARY
Glucose-Capillary: 145 mg/dL — ABNORMAL HIGH (ref 70–99)
Glucose-Capillary: 150 mg/dL — ABNORMAL HIGH (ref 70–99)
Glucose-Capillary: 150 mg/dL — ABNORMAL HIGH (ref 70–99)
Glucose-Capillary: 169 mg/dL — ABNORMAL HIGH (ref 70–99)
Glucose-Capillary: 171 mg/dL — ABNORMAL HIGH (ref 70–99)
Glucose-Capillary: 201 mg/dL — ABNORMAL HIGH (ref 70–99)

## 2019-04-22 LAB — BASIC METABOLIC PANEL
Anion gap: 10 (ref 5–15)
BUN: 15 mg/dL (ref 8–23)
CO2: 23 mmol/L (ref 22–32)
Calcium: 9.2 mg/dL (ref 8.9–10.3)
Chloride: 104 mmol/L (ref 98–111)
Creatinine, Ser: 0.89 mg/dL (ref 0.61–1.24)
GFR calc Af Amer: >60 mL/min (ref >60)
GFR calc non Af Amer: >60 mL/min (ref >60)
Glucose, Bld: 152 mg/dL — ABNORMAL HIGH (ref 70–99)
Potassium: 4.5 mmol/L (ref 3.5–5.1)
Sodium: 137 mmol/L (ref 135–145)

## 2019-04-22 LAB — HEMOGLOBIN AND HEMATOCRIT, BLOOD
HCT: 32 % — ABNORMAL LOW (ref 39.0–52.0)
Hemoglobin: 10.2 g/dL — ABNORMAL LOW (ref 13.0–17.0)

## 2019-04-22 LAB — SURGICAL PCR SCREEN
MRSA, PCR: NEGATIVE
Staphylococcus aureus: POSITIVE — AB

## 2019-04-22 SURGERY — COLONOSCOPY
Anesthesia: Monitor Anesthesia Care

## 2019-04-22 MED ORDER — PROPOFOL 500 MG/50ML IV EMUL
INTRAVENOUS | Status: AC
  Filled 2019-04-22: qty 50

## 2019-04-22 MED ORDER — SODIUM CHLORIDE 0.9 % IV SOLN
510.0000 mg | Freq: Once | INTRAVENOUS | Status: AC
  Administered 2019-04-22: 510 mg via INTRAVENOUS
  Filled 2019-04-22: qty 17

## 2019-04-22 MED ORDER — PROPOFOL 500 MG/50ML IV EMUL
INTRAVENOUS | Status: DC | PRN
  Administered 2019-04-22: 100 ug/kg/min via INTRAVENOUS

## 2019-04-22 MED ORDER — SODIUM CHLORIDE 0.9 % IV SOLN: INTRAVENOUS | Status: DC

## 2019-04-22 MED ORDER — PHENYLEPHRINE 40 MCG/ML (10ML) SYRINGE FOR IV PUSH (FOR BLOOD PRESSURE SUPPORT)
PREFILLED_SYRINGE | INTRAVENOUS | Status: DC | PRN
  Administered 2019-04-22: 80 ug via INTRAVENOUS

## 2019-04-22 MED ORDER — PROPOFOL 10 MG/ML IV BOLUS
INTRAVENOUS | Status: DC | PRN
  Administered 2019-04-22: 20 mg via INTRAVENOUS
  Administered 2019-04-22: 40 mg via INTRAVENOUS
  Administered 2019-04-22 (×2): 20 mg via INTRAVENOUS

## 2019-04-22 MED ORDER — LACTATED RINGERS IV SOLN: INTRAVENOUS | Status: DC | PRN

## 2019-04-22 NOTE — Transfer of Care (Signed)
Immediate Anesthesia Transfer of Care Note  Patient: Jordan [name]  Procedure(s) Performed: Procedure(s): COLONOSCOPY (N/A)  Patient Location: PACU  Anesthesia Type:MAC  Level of Consciousness:  sedated, patient cooperative and responds to stimulation  Airway & Oxygen Therapy:Patient Spontanous Breathing and Patient connected to face mask oxgen  Post-op Assessment:  Report given to PACU RN and Post -op Vital signs reviewed and stable  Post vital signs:  Reviewed and stable  Last Vitals:  Vitals:   04/22/19 0734 04/22/19 0950  BP: (!) 155/80 (!) 96/55  Pulse:  71  Resp: 17 12  Temp: 36.6 C   SpO2:  28%    Complications: No apparent anesthesia complications

## 2019-04-22 NOTE — Telephone Encounter (Signed)
appt made to see Dr Rush Landmark on 3/9 and labs in Epic to be done a few days prior.  The pt to be notified at discharge.

## 2019-04-22 NOTE — Progress Notes (Signed)
Patient reports bowel movements following bowel prep to be bloody with clots, he is unsure if there are solid stools present. Call placed to Dr. Rush Landmark to report this. Verbal order received for tap water enema x1 prior to colonoscopy today. States will proceed with colonoscopy as scheduled unless solid stool is present with bowel movements after enema. Tap water enema given, patient tolerated well. Bowel movement following brown/bloody in color, no solids present.

## 2019-04-22 NOTE — Op Note (Signed)
[facility] Patient Name: Jordan [name] Procedure Date: 04/22/2019 MRN: [id number] Attending MD: Justice Britain , MD Date of Birth: 22-Nov-1940 CSN: [id number] Age: 79 Admit Type: Outpatient Procedure:                Colonoscopy Indications:              Hematochezia, Acute post hemorrhagic anemia Providers:                Justice Britain, MD, Burtis Junes, RN, Janeece Agee,                            Technician Referring MD:             [facility] Medicines:                Monitored Anesthesia Care Complications:            No immediate complications. Estimated Blood Loss:     Estimated blood loss was minimal. Procedure:                Pre-Anesthesia Assessment:                           - Prior to the procedure, a History and Physical                            was performed, and patient medications and                            allergies were reviewed. The patient's tolerance of                            previous anesthesia was also reviewed. The risks                            and benefits of the procedure and the sedation                            options and risks were discussed with the patient.                            All questions were answered, and informed consent                            was obtained. Prior Anticoagulants: The patient has                            taken no previous anticoagulant or antiplatelet                            agents. ASA Grade Assessment: III - A patient with                            severe systemic disease. After reviewing the risks  and benefits, the patient was deemed in                            satisfactory condition to undergo the procedure.                           After obtaining informed consent, the colonoscope                            was passed under direct vision. Throughout the                            procedure, the patient's blood pressure, pulse, and                             oxygen saturations were monitored continuously. The                            [id number]-[id number] ([id number] Olympus pediatric colonscope                            was introduced through the anus and advanced to the                            the transverse colon for evaluation. This was the                            intended extent. The CF-[id number] ([id number]) Olympus                            colonoscope was introduced through the anus and                            advanced to the the cecum, identified by                            appendiceal orifice and ileocecal valve. The                            colonoscopy was technically difficult and complex                            due to inadequate bowel prep, a redundant colon and                            significant looping. Successful completion of the                            procedure was aided by changing the patient's                            position, changing the patient to a supine  position, using manual pressure, withdrawing and                            reinserting the scope, withdrawing the scope and                            replacing with the adult colonoscope and                            straightening and shortening the scope to obtain                            bowel loop reduction. Scope In: 8:23:52 AM Scope Out: 9:40:27 AM Scope Withdrawal Time: 0 hours 7 minutes 45 seconds  Total Procedure Duration: 1 hour 16 minutes 35 seconds  Findings:      The digital rectal exam findings include hemorrhoids. Pertinent       negatives include no palpable rectal lesions.      The colon (entire examined portion) was grossly redundant and with       significant looping.      Extensive amounts of semi-liquid stool was found in the entire colon,       interfering with visualization. Lavage of the area was performed using       copious amounts, resulting in clearance with fair  visualization.      Many small and large-mouthed diverticula were found in the entire colon.      Normal mucosa was found in the entire colon otherwise, but due to the       fair preparation, flat lesions or polyps could have been missed.      Non-bleeding non-thrombosed internal hemorrhoids were found during       retroflexion, during perianal exam and during digital exam. Impression:               - Hemorrhoids found on digital rectal exam.                           - Redundant colon.                           - Stool in the entire examined colon. Lavaged with                            fair preparation.                           - Diverticulosis in the entire examined colon.                           - Normal mucosa in the entire examined colon.                           - Non-bleeding non-thrombosed internal hemorrhoids.                           - Etiology of bleeding most likely diverticular in  origin. Moderate Sedation:      Not Applicable - Patient had care per Anesthesia. Recommendation:           - The patient will be observed post-procedure,                            until all discharge criteria are met.                           - Return patient to hospital ward for ongoing care.                           - Advance diet as tolerated.                           - IV Iron as inpatient. In 1 week start PO Iron 325                            mg daily. Follow up in clinic in 4-6 weeks with                            CBC/Iron/Ferritin/TIBC to be performed prior to                            clinic visit.                           - If patient has recurrent bleeding would monitor                            and if hemodynamically unstable then CT-Angiography                            v Tagged RBC scan should be entertained.                           - For adequate screening, will need repeat Full                            Colonoscopy with 2-day  preparation and 1 week                            Laxative therapy for repeat. Schedule for 90 minute                            procedure due to complexity of case. Start with                            Adult colonoscope +/- have SBE scope available.                           - The findings and recommendations were discussed  with the patient.                           - The findings and recommendations were discussed                            with the patient's family.                           - The findings and recommendations were discussed                            with the referring physician. Procedure Code(s):        --- Professional ---                           [phone], Colonoscopy, flexible; diagnostic, including                            collection of specimen(s) by brushing or washing,                            when performed (separate procedure) Diagnosis Code(s):        --- Professional ---                           K64.8, Other hemorrhoids                           K92.1, Melena (includes Hematochezia)                           D62, Acute posthemorrhagic anemia                           K57.30, Diverticulosis of large intestine without                            perforation or abscess without bleeding                           Q43.8, Other specified congenital malformations of                            intestine CPT copyright 2019 American Medical Association. All rights reserved. The codes documented in this report are preliminary and upon coder review may  be revised to meet current compliance requirements. Justice Britain, MD 04/22/2019 10:10:56 AM Number of Addenda: 0

## 2019-04-22 NOTE — Telephone Encounter (Signed)
-----   Message from Irving Copas., MD sent at 04/22/2019 10:13 AM EST ----- Regarding: Follow up Jordan [name], Patient needs GI Follow up in 4-6 weeks. He needs a CBC/Iron/TIBC/Ferritin to be drawn a few days before.Likely will be discharged over the weekend. Thanks. GM

## 2019-04-22 NOTE — Evaluation (Signed)
Physical Therapy Evaluation Patient Details Name: Jordan [name] MRN: [id number] DOB: August 25, 1940 Today's Date: 04/22/2019   History of Present Illness  Pt is a 79 y.o. male with a history of hypertension, hyperlipdemia, Dm2, CKD stage2, Gout, chronic back pain,  Prior ventral hernia repair,  diverticulosis. Patient presented secondary to rectal bleeding.  Pt s/p colonscopy on 2/5 which was significant for diverticulosis.  Clinical Impression  Pt admitted with above diagnosis. Pt reports that he is near baseline.  He did fatigue easier and needed use of bed rails for transfers.  Pt lives alone and motivated to return home and continue exercise program of walking 30 mins/day. Pt currently with functional limitations due to the deficits listed below (see PT Problem List). Pt will benefit from skilled PT to increase their independence and safety with mobility to allow discharge to the venue listed below.       Follow Up Recommendations No PT follow up    Equipment Recommendations  None recommended by PT    Recommendations for Other Services       Precautions / Restrictions Precautions Precautions: Fall Restrictions Weight Bearing Restrictions: No      Mobility  Bed Mobility Overal bed mobility: Needs Assistance Bed Mobility: Supine to Sit;Sit to Supine     Supine to sit: Modified independent (Device/Increase time) Sit to supine: Modified independent (Device/Increase time)   General bed mobility comments: use of bed rails  Transfers Overall transfer level: Needs assistance Equipment used: None Transfers: Sit to/from Stand Sit to Stand: Supervision            Ambulation/Gait Ambulation/Gait assistance: Supervision Gait Distance (Feet): 200 Feet Assistive device: IV Pole Gait Pattern/deviations: Step-through pattern;Wide base of support Gait velocity: decreased   General Gait Details: steady no LOB  Stairs            Wheelchair Mobility    Modified  Rankin (Stroke Patients Only)       Balance Overall balance assessment: Needs assistance Sitting-balance support: No upper extremity supported;Feet supported Sitting balance-Leahy Scale: Normal     Standing balance support: Single extremity supported;During functional activity Standing balance-Leahy Scale: Good                               Pertinent Vitals/Pain Pain Assessment: No/denies pain    Home Living Family/patient expects to be discharged to:: Private residence Living Arrangements: Alone Available Help at Discharge: Neighbor;Available PRN/intermittently Type of Home: House Home Access: Stairs to enter Entrance Stairs-Rails: Right;Left;Can reach both Entrance Stairs-Number of Steps: 3 Home Layout: One level Home Equipment: Cane - single point;Grab bars - tub/shower      Prior Function Level of Independence: Independent         Comments: Reports goes to mall and walks 30 mins per day     Hand Dominance        Extremity/Trunk Assessment   Upper Extremity Assessment Upper Extremity Assessment: Overall WFL for tasks assessed    Lower Extremity Assessment Lower Extremity Assessment: Overall WFL for tasks assessed    Cervical / Trunk Assessment Cervical / Trunk Assessment: Normal  Communication   Communication: No difficulties  Cognition Arousal/Alertness: Awake/alert Behavior During Therapy: WFL for tasks assessed/performed Overall Cognitive Status: Within Functional Limits for tasks assessed  General Comments General comments (skin integrity, edema, etc.): Pt reports slow gait at baseline.  Reports he has a cane if needed.  PT discussed safety and home and use of cane if needed until fully recovered.  Also, discussed building back up to 30 mins ambulation at mall (more frequent breaks or use of cane)    Exercises     Assessment/Plan    PT Assessment Patient needs continued PT  services  PT Problem List Decreased mobility;Decreased activity tolerance;Decreased balance;Decreased knowledge of use of DME;Decreased strength       PT Treatment Interventions DME instruction;Therapeutic activities;Gait training;Therapeutic exercise;Patient/family education;Stair training;Balance training;Functional mobility training    PT Goals (Current goals can be found in the Care Plan section)  Acute Rehab PT Goals Patient Stated Goal: return home PT Goal Formulation: With patient Time For Goal Achievement: 05/06/19 Potential to Achieve Goals: Good    Frequency Min 3X/week   Barriers to discharge        Co-evaluation               AM-PAC PT "6 Clicks" Mobility  Outcome Measure Help needed turning from your back to your side while in a flat bed without using bedrails?: None Help needed moving from lying on your back to sitting on the side of a flat bed without using bedrails?: A Little Help needed moving to and from a bed to a chair (including a wheelchair)?: None Help needed standing up from a chair using your arms (e.g., wheelchair or bedside chair)?: None Help needed to walk in hospital room?: None Help needed climbing 3-5 steps with a railing? : None 6 Click Score: 23    End of Session   Activity Tolerance: Patient tolerated treatment well Patient left: in bed;with call bell/phone within reach;with bed alarm set Nurse Communication: Mobility status PT Visit Diagnosis: Unsteadiness on feet (R26.81)    Time: 1730-1753 PT Time Calculation (min) (ACUTE ONLY): 23 min   Charges:   PT Evaluation $PT Eval Low Complexity: 1 Low          Maggie Font, PT Acute Rehab Services Pager [phone] [facility] [phone] [facility] [phone]   Jordan [name] 04/22/2019, 6:01 PM

## 2019-04-22 NOTE — Anesthesia Postprocedure Evaluation (Signed)
Anesthesia Post Note  Patient: Jordan [name]  Procedure(s) Performed: COLONOSCOPY (N/A )     Patient location during evaluation: Endoscopy Anesthesia Type: MAC Level of consciousness: awake and alert, patient cooperative and oriented Pain management: pain level controlled Vital Signs Assessment: post-procedure vital signs reviewed and stable Respiratory status: spontaneous breathing, nonlabored ventilation and respiratory function stable Cardiovascular status: blood pressure returned to baseline and stable Postop Assessment: no apparent nausea or vomiting Anesthetic complications: no    Last Vitals:  Vitals:   04/22/19 1010 04/22/19 1105  BP: (!) 116/57 (!) 145/74  Pulse: 67 69  Resp: 20 18  Temp:  (!) 36.4 C  SpO2: 100% 100%    Last Pain:  Vitals:   04/22/19 1105  TempSrc: Oral  PainSc: 0-No pain                 Betsabe Iglesia,E. Mar Walmer

## 2019-04-22 NOTE — Interval H&P Note (Signed)
History and Physical Interval Note:  04/22/2019 7:53 AM  Jordan [name]  has presented today for surgery, with the diagnosis of BRBPR.  The various methods of treatment have been discussed with the patient and family. After consideration of risks, benefits and other options for treatment, the patient has consented to  Procedure(s): COLONOSCOPY (N/A) as a surgical intervention.  The patient's history has been reviewed, patient examined, no change in status, stable for surgery.  I have reviewed the patient's chart and labs.  Questions were answered to the patient's satisfaction.     [employer]

## 2019-04-22 NOTE — Progress Notes (Signed)
PROGRESS NOTE    Jordan [name]  [id number] DOB: [birth year] DOA: 04/20/2019 PCP: Steele Sizer, MD   Brief Narrative: Jordan [name] is a 79 y.o. male with a history of hypertension, hyperlipdemia, Dm2, CKD stage2, Gout, chronic back pain,  Prior ventral hernia repair,  diverticulosis. Patient presented secondary to rectal bleeding.   Assessment & Plan:   Principal Problem:   Rectal bleeding Active Problems:   Controlled type 2 diabetes mellitus with microalbuminuria (HCC)   Abnormal liver function   Rectal bleeding Patient with bright red blood per rectum.  No episodes while inpatient.  GI consulted on admission with plans for colonoscopy on 2/5.  Hemoglobin is down from admission but stable. GI performed colonoscopy which was significant for diverticulosis. -Watch for recurrent hematochezia -PT eval as patient endorses some weakness and is concerned about safety at home since he lives alone  Elevated alkaline phosphatase/AST/bilirubin Unsure of etiology.  CT scan on admission without evidence of hepatobiliary biliary disease biliary disease; no gallstones seen in the gallbladder.  Bilirubin, alkaline phosphatase, AST now back to baseline.  It is possible the initial labs were erroneous. Patient asymptomatic.  Diabetes mellitus, type 2 Patient is on Glucotrol XL 5 mg daily, Glucophage XR 1000 mg daily.  Essential hypertension Patient is on amlodipine 5 mg daily and spironolactone 50 mg daily as an outpatient.  Patient is also on aspirin 162 mg daily. -Continue amlodipine and spironolactone -Hold aspirin  Hyperlipidemia Patient is on Lipitor 40 mg daily and omega-3 fatty acids twice daily -Continue Lipitor  Gout Patient is on allopurinol 300 mg daily as an outpatient.  Morbid obesity Body mass index is 45.78 kg/m.   DVT prophylaxis: SCDs secondary to GI bleeding Code Status:   Code Status: Full Code Family Communication: Brother on telephone, no  response Disposition Plan: Discharge pending PT evaluation for safety. Likely discharge tomorrow.   Consultants:   [facility]  Procedures:   None  Antimicrobials:  None   Subjective: No recurrent bleeding.  Objective: Vitals:   04/22/19 1000 04/22/19 1010 04/22/19 1105 04/22/19 1411  BP: (!) 98/48 (!) 116/57 (!) 145/74 140/78  Pulse: 76 67 69 99  Resp: 16 20 18 20   Temp:   (!) 97.5 F (36.4 C) 98.1 F (36.7 C)  TempSrc:   Oral Oral  SpO2: 100% 100% 100% 100%  Weight:      Height:        Intake/Output Summary (Last 24 hours) at 04/22/2019 1415 Last data filed at 04/22/2019 1225 Gross per 24 hour  Intake 1560 ml  Output 1100 ml  Net 460 ml   Filed Weights   04/21/19 0444 04/22/19 0600 04/22/19 0734  Weight: (!) 136.8 kg (!) 138.4 kg (!) 140.6 kg    Examination:  General exam: Appears calm and comfortable  Respiratory system: Clear to auscultation. Respiratory effort normal. Cardiovascular system: S1 & S2 heard, RRR. 2/6 systolic murmur. Gastrointestinal system: Abdomen is nondistended, soft and nontender. No organomegaly or masses felt. Normal bowel sounds heard. Central nervous system: Alert and oriented. No focal neurological deficits. Extremities: No edema. No calf tenderness Skin: No cyanosis. No rashes Psychiatry: Judgement and insight appear normal. Mood & affect appropriate.     Data Reviewed: I have personally reviewed following labs and imaging studies  CBC: Recent Labs  Lab 04/20/19 1723 04/21/19 0543 04/21/19 1636 04/22/19 0533  WBC 9.0 6.5  --   --   NEUTROABS 6.8  --   --   --  HGB 11.6* 10.0* 10.0* 10.2*  HCT 36.8* 31.6* 31.2* 32.0*  MCV 93.4 92.4  --   --   PLT 225 178  --   --    Basic Metabolic Panel: Recent Labs  Lab 04/20/19 1723 04/21/19 0543 04/22/19 0533  NA 136 135 137  K 4.4 4.2 4.5  CL 102 103 104  CO2 19* 25 23  GLUCOSE 210* 145* 152*  BUN 26* 26* 15  CREATININE 1.29* 0.89 0.89  CALCIUM 9.7 9.3 9.2    GFR: Estimated Creatinine Clearance: 97 mL/min (by C-G formula based on SCr of 0.89 mg/dL). Liver Function Tests: Recent Labs  Lab 04/20/19 1955 04/21/19 0543  AST 161* 20  ALT 39 14  ALKPHOS 166* 47  BILITOT 7.6* 0.9  PROT 7.4 7.0  ALBUMIN 2.9* 3.6   No results for input(s): LIPASE, AMYLASE in the last 168 hours. No results for input(s): AMMONIA in the last 168 hours. Coagulation Profile: Recent Labs  Lab 04/20/19 1723 04/20/19 1955  INR >10.0* 1.3*   Cardiac Enzymes: No results for input(s): CKTOTAL, CKMB, CKMBINDEX, TROPONINI in the last 168 hours. BNP (last 3 results) No results for input(s): PROBNP in the last 8760 hours. HbA1C: No results for input(s): HGBA1C in the last 72 hours. CBG: Recent Labs  Lab 04/22/19 0005 04/22/19 0418 04/22/19 0753 04/22/19 1058 04/22/19 1146  GLUCAP 150* 145* 150* 169* 171*   Lipid Profile: No results for input(s): CHOL, HDL, LDLCALC, TRIG, CHOLHDL, LDLDIRECT in the last 72 hours. Thyroid Function Tests: No results for input(s): TSH, T4TOTAL, FREET4, T3FREE, THYROIDAB in the last 72 hours. Anemia Panel: No results for input(s): VITAMINB12, FOLATE, FERRITIN, TIBC, IRON, RETICCTPCT in the last 72 hours. Sepsis Labs: No results for input(s): PROCALCITON, LATICACIDVEN in the last 168 hours.  Recent Results (from the past 240 hour(s))  SARS CORONAVIRUS 2 (TAT 6-24 HRS) Nasopharyngeal Nasopharyngeal Swab     Status: None   Collection Time: 04/20/19  9:07 PM   Specimen: Nasopharyngeal Swab  Result Value Ref Range Status   SARS Coronavirus 2 NEGATIVE NEGATIVE Final    Comment: (NOTE) SARS-CoV-2 target nucleic acids are NOT DETECTED. The SARS-CoV-2 RNA is generally detectable in upper and lower respiratory specimens during the acute phase of infection. Negative results do not preclude SARS-CoV-2 infection, do not rule out co-infections with other pathogens, and should not be used as the sole basis for treatment or other  patient management decisions. Negative results must be combined with clinical observations, patient history, and epidemiological information. The expected result is Negative. Fact Sheet for Patients: SugarRoll.be Fact Sheet for Healthcare Providers: https://www.woods-mathews.com/ This test is not yet approved or cleared by the [place] FDA and  has been authorized for detection and/or diagnosis of SARS-CoV-2 by FDA under an Emergency Use Authorization (EUA). This EUA will remain  in effect (meaning this test can be used) for the duration of the COVID-19 declaration under Section 56 4(b)(1) of the Act, 21 U.S.C. section 360bbb-3(b)(1), unless the authorization is terminated or revoked sooner. Performed at [facility], [place] [address], [place], [place] [address]   Surgical pcr screen     Status: Abnormal   Collection Time: 04/22/19 12:09 AM   Specimen: Nasal Mucosa; Nasal Swab  Result Value Ref Range Status   MRSA, PCR NEGATIVE NEGATIVE Final   Staphylococcus aureus POSITIVE (A) NEGATIVE Final    Comment: (NOTE) The Xpert SA Assay (FDA approved for NASAL specimens in patients 68 years of age and older),  is one component of a comprehensive surveillance program. It is not intended to diagnose infection nor to guide or monitor treatment. Performed at [facility], [place] [address] [place], [place] [address]          Radiology Studies: CT Abdomen Pelvis W Contrast  Result Date: 04/20/2019 CLINICAL DATA:  79 year old male with rectal bleeding. Concern for acute diverticulitis. EXAM: CT ABDOMEN AND PELVIS WITH CONTRAST TECHNIQUE: Multidetector CT imaging of the abdomen and pelvis was performed using the standard protocol following bolus administration of intravenous contrast. CONTRAST:  13mL OMNIPAQUE IOHEXOL 300 MG/ML  SOLN COMPARISON:  CT of the abdomen pelvis dated 10/28/2012. FINDINGS: Lower chest: The  visualized lung bases are clear. Three vessel coronary vascular calcification noted. No intra-abdominal free air or free fluid. Hepatobiliary: No focal liver abnormality is seen. No gallstones, gallbladder wall thickening, or biliary dilatation. Pancreas: Unremarkable. No pancreatic ductal dilatation or surrounding inflammatory changes. Spleen: Normal in size without focal abnormality. Adrenals/Urinary Tract: The adrenal glands are unremarkable. There is a horseshoe kidney. Several renal cysts measuring up to 3.5 cm in the inferior pole of the right kidney. Multiple additional subcentimeter hypodense lesions are too small to characterize. There is mild fullness of the left renal collecting system. No hydronephrosis on right. There is symmetric enhancement and excretion of contrast by both kidneys. The visualized ureters appear unremarkable. There is a small anterior bladder dome diverticula. Stomach/Bowel: There is severe sigmoid diverticulosis without active inflammatory changes. Additional scattered colonic diverticula noted. There is no bowel obstruction or active inflammation. The appendix is normal. Postsurgical changes of the bowel with anastomotic sutures noted. Vascular/Lymphatic: Advanced aortoiliac atherosclerotic disease. The IVC is unremarkable. No portal venous gas. There is no adenopathy. Reproductive: Enlarged prostate gland measuring approximately 5.5 cm in transverse axial diameter. Other: Postsurgical changes of ventral hernia repair. A chronic appearing collection in the midline subcutaneous soft tissues of the anterior abdominal wall along the hernia repair measures approximately 7.5 x 2.0 cm in greatest axial dimensions and 11 cm in craniocaudal length. Musculoskeletal: Multilevel degenerative changes of the spine. No acute osseous pathology. IMPRESSION: 1. No acute intra-abdominal or pelvic pathology. 2. Severe sigmoid and diffuse colonic diverticulosis. No bowel obstruction or active  inflammation. Normal appendix. No definite evidence of active diverticular bleed by CT. 3. 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 4. 3 vessel coronary vascular calcification and advanced Aortic Atherosclerosis (ICD10-I70.0). Electronically Signed   By: Anner Crete M.D.   On: 04/20/2019 19:55        Scheduled Meds: . amLODipine  5 mg Oral Daily  . atorvastatin  40 mg Oral q1800  . insulin aspart  0-9 Units Subcutaneous Q4H  . latanoprost  1 drop Left Eye QHS  . spironolactone  50 mg Oral Daily   Continuous Infusions: . ferumoxytol       LOS: 0 days     Cordelia Poche, MD [facility] 04/22/2019, 2:15 PM  If 7PM-7AM, please contact night-coverage [email or web address]

## 2019-04-23 LAB — GLUCOSE, CAPILLARY: Glucose-Capillary: 152 mg/dL — ABNORMAL HIGH (ref 70–99)

## 2019-04-23 MED ORDER — ASPIRIN 81 MG PO TABS: 162.0000 mg | ORAL_TABLET | Freq: Every day | ORAL | Status: DC

## 2019-04-23 MED ORDER — FERROUS GLUCONATE 324 (37.5 FE) MG PO TABS: 1.0000 | ORAL_TABLET | Freq: Every day | ORAL | 0 refills | Status: DC

## 2019-04-23 NOTE — Discharge Instructions (Signed)
Jordan [name],  You had GI bleeding. This was secondary to diverticulosis. Please follow-up with gastroenterology.

## 2019-04-23 NOTE — Discharge Summary (Addendum)
Physician Discharge Summary  KASHAWN DIRR SWF:[id number] DOB: [birth year] DOA: 04/20/2019  PCP: Steele Sizer, MD  Admit date: 04/20/2019 Discharge date: 04/23/2019  Admitted From: Home Disposition: Home  Recommendations for Outpatient Follow-up:  1. Follow up with PCP in 1 week 2. Follow up with GI in 4-6 weeks for hospital follow-up and eventual repeat colonoscopy 3. CBC/Iron/Ferritin/TIBC to be performed prior to clinic visit per GI 4. Iron supplementation to start one week post discharge 5. Please obtain BMP/CBC in one week 6. Please follow up on the following pending results: None  Home Health: None Equipment/Devices: None  Discharge Condition: Stable CODE STATUS: Full Code Diet recommendation: Heart healthy   Brief/Interim Summary:  Admission HPI written by Jani Gravel, MD    HPI:   Maurio Baize  is a 79 y.o. male,   w hypertension, hyperlipdemia, Dm2, CKD stage2, Gout, chronic back pain,  Prior ventral hernia repair,  diverticulosis,  apparently presents w rectal bleeding x 2 days.  Pt had 1 episode on Sunday.  Pt thought that a new medicine that he was put on caused it so he stopped it. Pt can't recall the name of the medication.  Pt was at [facility] and had some rectal bleeding and therefore presented to ED Pt denies fever, chills, cough, cp, palp, sob, n/v, abd pain, diarrhea, black stool  Pt does take 2 baby aspirin per nite. Pt is not taking NSAIDS.    Pt states has had prior colonoscopy in [place], [place] but can't recall the name of the physician and thinks it was > 5 years ago.  In ED,  T 98.5, P 120, R 18, Bp 111/63  Pox 98% on RA Wt 139kg  CT abd/ pelvis IMPRESSION: 1. No acute intra-abdominal or pelvic pathology. 2. Severe sigmoid and diffuse colonic diverticulosis. No bowel obstruction or active inflammation. Normal appendix. No definite evidence of active diverticular bleed by CT. 3. 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 4. 3 vessel coronary vascular calcification and advanced Aortic Atherosclerosis (ICD10-I70.0).     Hospital course:  Rectal bleeding Patient with bright red blood per rectum.  No episodes while inpatient.  GI consulted on admission with plans for colonoscopy on 2/5.  Hemoglobin is down from admission but stable. GI performed colonoscopy which was significant for diverticulosis. Patient had no recurrent bleeding during hospitalization. Hemoglobin on admission of 11.6 and down to 10.2 prior to discharge and stable. Patient given IV Feraheme for iron deficiency and will be discharged on oral iron in addition to GI follow-up. -Watch for recurrent hematochezia -PT eval as patient endorses some weakness and is concerned about safety at home since he lives alone  Elevated alkaline phosphatase/AST/bilirubin Unsure of etiology.  CT scan on admission without evidence of hepatobiliary biliary disease biliary disease; no gallstones seen in the gallbladder.  Bilirubin, alkaline phosphatase, AST now back to baseline.  It is possible the initial labs were erroneous. Patient asymptomatic.  Diabetes mellitus, type 2 Patient is on Glucotrol XL 5 mg daily, Glucophage XR 1000 mg daily.  Essential hypertension Patient is on amlodipine 5 mg daily and spironolactone 50 mg daily as an outpatient.  Patient is also on aspirin 162 mg daily. Continue amlodipine and spironolactone. Discussed with GI; okay to resume home aspirin.  Hyperlipidemia Patient is on Lipitor 40 mg daily and omega-3 fatty acids twice daily. Continue home regimen.  Gout Patient is on allopurinol 300 mg daily as an outpatient.  Morbid obesity Body  mass index is 43.87 kg/m.  Discharge Diagnoses:  Principal Problem:   Rectal bleeding Active Problems:   Controlled type 2 diabetes mellitus with microalbuminuria (HCC)   Abnormal liver function   Rectal bleed    Discharge Instructions  Discharge Instructions      Increase activity slowly   Complete by: As directed      Allergies as of 04/23/2019      Reactions   Ace Inhibitors Other (See Comments)   angioedema      Medication List    STOP taking these medications   ammonium lactate 12 % cream Commonly known as: AMLACTIN     TAKE these medications   Accu-Chek Aviva Plus test strip Generic drug: glucose blood 1 each by Other route 2 (two) times daily as needed for other. Use as instructed   Accu-Chek Aviva Plus w/Device Kit 1 each by Does not apply route 2 (two) times daily. Use as directed   Accu-Chek FastClix Lancets Misc 1 each by Subdermal route 2 (two) times daily.   allopurinol 300 MG tablet Commonly known as: ZYLOPRIM Take 1 tablet (300 mg total) by mouth daily.   amLODipine 5 MG tablet Commonly known as: NORVASC Take 1 tablet (5 mg total) by mouth daily.   aspirin 81 MG tablet Take 2 tablets (162 mg total) by mouth at bedtime.   atorvastatin 40 MG tablet Commonly known as: LIPITOR Take 1 tablet (40 mg total) by mouth daily.   Ferrous Gluconate 324 (37.5 Fe) MG Tabs Take 1 tablet (324 mg total) by mouth daily. Start taking on: April 30, 2019   FISH OIL PO Take 1 capsule by mouth 2 (two) times daily.   glipiZIDE 5 MG 24 hr tablet Commonly known as: GLUCOTROL XL Take 1 tablet (5 mg total) by mouth daily with breakfast.   latanoprost 0.005 % ophthalmic solution Commonly known as: XALATAN Place 1 drop into the left eye at bedtime.   metFORMIN 500 MG 24 hr tablet Commonly known as: GLUCOPHAGE-XR Take 2 tablets (1,000 mg total) by mouth daily with breakfast.   niacin 100 MG tablet Take 100 mg by mouth at bedtime.   polyethylene glycol 17 g packet Commonly known as: MIRALAX / GLYCOLAX Take 17 g by mouth at bedtime.   sildenafil 100 MG tablet Commonly known as: Viagra Take 0.5-1 tablets (50-100 mg total) by mouth daily as needed for erectile dysfunction.   spironolactone 50 MG tablet Commonly known  as: Aldactone Take 1 tablet (50 mg total) by mouth daily.       Allergies  Allergen Reactions  . Ace Inhibitors Other (See Comments)    angioedema    Consultations:  [facility]   Procedures/Studies: CT Abdomen Pelvis W Contrast  Result Date: 04/20/2019 CLINICAL DATA:  79 year old male with rectal bleeding. Concern for acute diverticulitis. EXAM: CT ABDOMEN AND PELVIS WITH CONTRAST TECHNIQUE: Multidetector CT imaging of the abdomen and pelvis was performed using the standard protocol following bolus administration of intravenous contrast. CONTRAST:  156m OMNIPAQUE IOHEXOL 300 MG/ML  SOLN COMPARISON:  CT of the abdomen pelvis dated 10/28/2012. FINDINGS: Lower chest: The visualized lung bases are clear. Three vessel coronary vascular calcification noted. No intra-abdominal free air or free fluid. Hepatobiliary: No focal liver abnormality is seen. No gallstones, gallbladder wall thickening, or biliary dilatation. Pancreas: Unremarkable. No pancreatic ductal dilatation or surrounding inflammatory changes. Spleen: Normal in size without focal abnormality. Adrenals/Urinary Tract: The adrenal glands are unremarkable. There is a horseshoe kidney. Several renal cysts  measuring up to 3.5 cm in the inferior pole of the right kidney. Multiple additional subcentimeter hypodense lesions are too small to characterize. There is mild fullness of the left renal collecting system. No hydronephrosis on right. There is symmetric enhancement and excretion of contrast by both kidneys. The visualized ureters appear unremarkable. There is a small anterior bladder dome diverticula. Stomach/Bowel: There is severe sigmoid diverticulosis without active inflammatory changes. Additional scattered colonic diverticula noted. There is no bowel obstruction or active inflammation. The appendix is normal. Postsurgical changes of the bowel with anastomotic sutures noted. Vascular/Lymphatic: Advanced aortoiliac  atherosclerotic disease. The IVC is unremarkable. No portal venous gas. There is no adenopathy. Reproductive: Enlarged prostate gland measuring approximately 5.5 cm in transverse axial diameter. Other: Postsurgical changes of ventral hernia repair. A chronic appearing collection in the midline subcutaneous soft tissues of the anterior abdominal wall along the hernia repair measures approximately 7.5 x 2.0 cm in greatest axial dimensions and 11 cm in craniocaudal length. Musculoskeletal: Multilevel degenerative changes of the spine. No acute osseous pathology. IMPRESSION: 1. No acute intra-abdominal or pelvic pathology. 2. Severe sigmoid and diffuse colonic diverticulosis. No bowel obstruction or active inflammation. Normal appendix. No definite evidence of active diverticular bleed by CT. 3. 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 4. 3 vessel coronary vascular calcification and advanced Aortic Atherosclerosis (ICD10-I70.0). Electronically Signed   By: Anner Crete M.D.   On: 04/20/2019 19:55    COLONOSCOPY (04/22/2019) Impression:               - Hemorrhoids found on digital rectal exam.                           - Redundant colon.                           - Stool in the entire examined colon. Lavaged with                            fair preparation.                           - Diverticulosis in the entire examined colon.                           - Normal mucosa in the entire examined colon.                           - Non-bleeding non-thrombosed internal hemorrhoids.                           - Etiology of bleeding most likely diverticular in                            origin. Moderate Sedation:      Not Applicable - Patient had care per Anesthesia. Recommendation:           - The patient will be observed post-procedure,                            until all discharge criteria are met.                           -  Return patient to hospital ward for ongoing  care.                           - Advance diet as tolerated.                           - IV Iron as inpatient. In 1 week start PO Iron 325                            mg daily. Follow up in clinic in 4-6 weeks with                            CBC/Iron/Ferritin/TIBC to be performed prior to                            clinic visit.                           - If patient has recurrent bleeding would monitor                            and if hemodynamically unstable then CT-Angiography                            v Tagged RBC scan should be entertained.                           - For adequate screening, will need repeat Full                            Colonoscopy with 2-day preparation and 1 week                            Laxative therapy for repeat. Schedule for 90 minute                            procedure due to complexity of case. Start with                            Adult colonoscope +/- have SBE scope available.   Subjective: No bleeding overnight  Discharge Exam: Vitals:   04/22/19 2149 04/23/19 0554  BP: 117/87 131/75  Pulse: 98 80  Resp: 18 18  Temp: 98.7 F (37.1 C) 98.4 F (36.9 C)  SpO2: 97% 100%   Vitals:   04/22/19 1105 04/22/19 1411 04/22/19 2149 04/23/19 0554  BP: (!) 145/74 140/78 117/87 131/75  Pulse: 69 99 98 80  Resp: '18 20 18 18  ' Temp: (!) 97.5 F (36.4 C) 98.1 F (36.7 C) 98.7 F (37.1 C) 98.4 F (36.9 C)  TempSrc: Oral Oral Oral Oral  SpO2: 100% 100% 97% 100%  Weight:    134.8 kg  Height:        General: Pt is alert, awake, not in acute distress Cardiovascular: RRR, G8/B1 +, 2/6 systolic murmur, no rubs, no gallops Respiratory: CTA bilaterally, no wheezing, no rhonchi Abdominal: Soft,  NT, ND, bowel sounds + Extremities: no edema, no cyanosis    The results of significant diagnostics from this hospitalization (including imaging, microbiology, ancillary and laboratory) are listed below for reference.     Microbiology: Recent Results (from  the past 240 hour(s))  SARS CORONAVIRUS 2 (TAT 6-24 HRS) Nasopharyngeal Nasopharyngeal Swab     Status: None   Collection Time: 04/20/19  9:07 PM   Specimen: Nasopharyngeal Swab  Result Value Ref Range Status   SARS Coronavirus 2 NEGATIVE NEGATIVE Final    Comment: (NOTE) SARS-CoV-2 target nucleic acids are NOT DETECTED. The SARS-CoV-2 RNA is generally detectable in upper and lower respiratory specimens during the acute phase of infection. Negative results do not preclude SARS-CoV-2 infection, do not rule out co-infections with other pathogens, and should not be used as the sole basis for treatment or other patient management decisions. Negative results must be combined with clinical observations, patient history, and epidemiological information. The expected result is Negative. Fact Sheet for Patients: SugarRoll.be Fact Sheet for Healthcare Providers: https://www.woods-mathews.com/ This test is not yet approved or cleared by the [place] FDA and  has been authorized for detection and/or diagnosis of SARS-CoV-2 by FDA under an Emergency Use Authorization (EUA). This EUA will remain  in effect (meaning this test can be used) for the duration of the COVID-19 declaration under Section 56 4(b)(1) of the Act, 21 U.S.C. section 360bbb-3(b)(1), unless the authorization is terminated or revoked sooner. Performed at [facility], [place] [address], [place], [place] [address]   Surgical pcr screen     Status: Abnormal   Collection Time: 04/22/19 12:09 AM   Specimen: Nasal Mucosa; Nasal Swab  Result Value Ref Range Status   MRSA, PCR NEGATIVE NEGATIVE Final   Staphylococcus aureus POSITIVE (A) NEGATIVE Final    Comment: (NOTE) The Xpert SA Assay (FDA approved for NASAL specimens in patients 38 years of age and older), is one component of a comprehensive surveillance program. It is not intended to diagnose infection nor to guide or monitor  treatment. Performed at [facility], [place] [address] [place], [place] [address]      Labs: BNP (last 3 results) No results for input(s): BNP in the last 8760 hours. Basic Metabolic Panel: Recent Labs  Lab 04/20/19 1723 04/21/19 0543 04/22/19 0533  NA 136 135 137  K 4.4 4.2 4.5  CL 102 103 104  CO2 19* 25 23  GLUCOSE 210* 145* 152*  BUN 26* 26* 15  CREATININE 1.29* 0.89 0.89  CALCIUM 9.7 9.3 9.2   Liver Function Tests: Recent Labs  Lab 04/20/19 1955 04/21/19 0543  AST 161* 20  ALT 39 14  ALKPHOS 166* 47  BILITOT 7.6* 0.9  PROT 7.4 7.0  ALBUMIN 2.9* 3.6   No results for input(s): LIPASE, AMYLASE in the last 168 hours. No results for input(s): AMMONIA in the last 168 hours. CBC: Recent Labs  Lab 04/20/19 1723 04/21/19 0543 04/21/19 1636 04/22/19 0533  WBC 9.0 6.5  --   --   NEUTROABS 6.8  --   --   --   HGB 11.6* 10.0* 10.0* 10.2*  HCT 36.8* 31.6* 31.2* 32.0*  MCV 93.4 92.4  --   --   PLT 225 178  --   --    Cardiac Enzymes: No results for input(s): CKTOTAL, CKMB, CKMBINDEX, TROPONINI in the last 168 hours. BNP: Invalid input(s): POCBNP CBG: Recent Labs  Lab 04/22/19 0753 04/22/19 1058 04/22/19 1146 04/22/19 1601 04/23/19  0709  GLUCAP 150* 169* 171* 201* 152*   D-Dimer No results for input(s): DDIMER in the last 72 hours. Hgb A1c No results for input(s): HGBA1C in the last 72 hours. Lipid Profile No results for input(s): CHOL, HDL, LDLCALC, TRIG, CHOLHDL, LDLDIRECT in the last 72 hours. Thyroid function studies No results for input(s): TSH, T4TOTAL, T3FREE, THYROIDAB in the last 72 hours.  Invalid input(s): FREET3 Anemia work up No results for input(s): VITAMINB12, FOLATE, FERRITIN, TIBC, IRON, RETICCTPCT in the last 72 hours. Urinalysis    Component Value Date/Time   APPEARANCEUR Cloudy (A) 12/24/2015 1037   GLUCOSEU 2+ (A) 12/24/2015 1037   BILIRUBINUR Negative 12/24/2015 1037   PROTEINUR Negative 12/24/2015 1037    NITRITE Negative 12/24/2015 1037   LEUKOCYTESUR Trace (A) 12/24/2015 1037   Sepsis Labs Invalid input(s): PROCALCITONIN,  WBC,  LACTICIDVEN Microbiology Recent Results (from the past 240 hour(s))  SARS CORONAVIRUS 2 (TAT 6-24 HRS) Nasopharyngeal Nasopharyngeal Swab     Status: None   Collection Time: 04/20/19  9:07 PM   Specimen: Nasopharyngeal Swab  Result Value Ref Range Status   SARS Coronavirus 2 NEGATIVE NEGATIVE Final    Comment: (NOTE) SARS-CoV-2 target nucleic acids are NOT DETECTED. The SARS-CoV-2 RNA is generally detectable in upper and lower respiratory specimens during the acute phase of infection. Negative results do not preclude SARS-CoV-2 infection, do not rule out co-infections with other pathogens, and should not be used as the sole basis for treatment or other patient management decisions. Negative results must be combined with clinical observations, patient history, and epidemiological information. The expected result is Negative. Fact Sheet for Patients: SugarRoll.be Fact Sheet for Healthcare Providers: https://www.woods-mathews.com/ This test is not yet approved or cleared by the [place] FDA and  has been authorized for detection and/or diagnosis of SARS-CoV-2 by FDA under an Emergency Use Authorization (EUA). This EUA will remain  in effect (meaning this test can be used) for the duration of the COVID-19 declaration under Section 56 4(b)(1) of the Act, 21 U.S.C. section 360bbb-3(b)(1), unless the authorization is terminated or revoked sooner. Performed at [facility], [place] [address] [place], [place] [address]   Surgical pcr screen     Status: Abnormal   Collection Time: 04/22/19 12:09 AM   Specimen: Nasal Mucosa; Nasal Swab  Result Value Ref Range Status   MRSA, PCR NEGATIVE NEGATIVE Final   Staphylococcus aureus POSITIVE (A) NEGATIVE Final    Comment: (NOTE) The Xpert SA Assay (FDA approved for  NASAL specimens in patients 64 years of age and older), is one component of a comprehensive surveillance program. It is not intended to diagnose infection nor to guide or monitor treatment. Performed at [facility], [place] [address] [place],  [address]      Time coordinating discharge: 35 minutes  SIGNED:   Cordelia Poche, MD [facility] 04/23/2019, 9:20 AM

## 2019-04-23 NOTE — Progress Notes (Signed)
Nsg Discharge Note  Admit Date:  04/20/2019 Discharge date: 04/23/2019   [name] to be D/C'd Home per MD order.  AVS completed.  Copy for chart, and copy for patient signed, and dated. Patient/caregiver able to verbalize understanding.  Discharge Medication: Allergies as of 04/23/2019      Reactions   Ace Inhibitors Other (See Comments)   angioedema      Medication List    STOP taking these medications   ammonium lactate 12 % cream Commonly known as: AMLACTIN     TAKE these medications   Accu-Chek Aviva Plus test strip Generic drug: glucose blood 1 each by Other route 2 (two) times daily as needed for other. Use as instructed   Accu-Chek Aviva Plus w/Device Kit 1 each by Does not apply route 2 (two) times daily. Use as directed   Accu-Chek FastClix Lancets Misc 1 each by Subdermal route 2 (two) times daily.   allopurinol 300 MG tablet Commonly known as: ZYLOPRIM Take 1 tablet (300 mg total) by mouth daily.   amLODipine 5 MG tablet Commonly known as: NORVASC Take 1 tablet (5 mg total) by mouth daily.   aspirin 81 MG tablet Take 2 tablets (162 mg total) by mouth at bedtime.   atorvastatin 40 MG tablet Commonly known as: LIPITOR Take 1 tablet (40 mg total) by mouth daily.   Ferrous Gluconate 324 (37.5 Fe) MG Tabs Take 1 tablet (324 mg total) by mouth daily. Start taking on: April 30, 2019   FISH OIL PO Take 1 capsule by mouth 2 (two) times daily.   glipiZIDE 5 MG 24 hr tablet Commonly known as: GLUCOTROL XL Take 1 tablet (5 mg total) by mouth daily with breakfast.   latanoprost 0.005 % ophthalmic solution Commonly known as: XALATAN Place 1 drop into the left eye at bedtime.   metFORMIN 500 MG 24 hr tablet Commonly known as: GLUCOPHAGE-XR Take 2 tablets (1,000 mg total) by mouth daily with breakfast.   niacin 100 MG tablet Take 100 mg by mouth at bedtime.   polyethylene glycol 17 g packet Commonly known as: MIRALAX / GLYCOLAX Take 17 g by  mouth at bedtime.   sildenafil 100 MG tablet Commonly known as: Viagra Take 0.5-1 tablets (50-100 mg total) by mouth daily as needed for erectile dysfunction.   spironolactone 50 MG tablet Commonly known as: Aldactone Take 1 tablet (50 mg total) by mouth daily.       Discharge Assessment: Vitals:   04/23/19 0554 04/23/19 1040  BP: 131/75 122/73  Pulse: 80 87  Resp: 18   Temp: 98.4 F (36.9 C) 98.2 F (36.8 C)  SpO2: 100% 97%   Skin clean, dry and intact without evidence of skin break down, no evidence of skin tears noted. IV catheter discontinued intact. Site without signs and symptoms of complications - no redness or edema noted at insertion site, patient denies c/o pain - only slight tenderness at site.  Dressing with slight pressure applied.  D/c Instructions-Education: Discharge instructions given to patient/family with verbalized understanding. D/c education completed with patient/family including follow up instructions, medication list, d/c activities limitations if indicated, with other d/c instructions as indicated by MD - patient able to verbalize understanding, all questions fully answered. Patient instructed to return to ED, call 911, or call MD for any changes in condition.  Patient escorted via [place], and D/C home via private auto.  Eustace Pen, RN 04/23/2019 11:33 AM

## 2019-04-25 ENCOUNTER — Telehealth: Payer: Self-pay

## 2019-04-25 ENCOUNTER — Encounter: Payer: Self-pay | Admitting: *Deleted

## 2019-04-25 NOTE — Telephone Encounter (Signed)
Transition Care Management Follow-up Telephone Call  Date of discharge and from where: 04/23/19 [facility]  How have you been since you were released from the hospital?  Pt states he is doing fine, has not had a bowel movement since discharge and denies any rectal bleeding.   Any questions or concerns? No   Items Reviewed:  Did the pt receive and understand the discharge instructions provided? Yes   Medications obtained and verified? Yes   Any new allergies since your discharge? No   Dietary orders reviewed? Yes  Do you have support at home? Yes   Functional Questionnaire: (I = Independent and D = Dependent) ADLs: I  Bathing/Dressing- I  Meal Prep- I  Eating- I  Maintaining continence- I  Transferring/Ambulation- I  Managing Meds- I  Follow up appointments reviewed:   PCP Hospital f/u appt confirmed? Yes  Scheduled to see Dr. Ancil Boozer on 04/26/19 @ 1:40.  [facility] f/u appt confirmed? Yes  Scheduled to see Dr. Louanna Raw on 05/24/19.  Are transportation arrangements needed? No   If their condition worsens, is the pt aware to call PCP or go to the Emergency Dept.? Yes  Was the patient provided with contact information for the PCP's office or ED? Yes  Was to pt encouraged to call back with questions or concerns? Yes

## 2019-04-26 ENCOUNTER — Other Ambulatory Visit: Payer: Self-pay

## 2019-04-26 ENCOUNTER — Encounter: Payer: Self-pay | Admitting: Family Medicine

## 2019-04-26 ENCOUNTER — Ambulatory Visit (INDEPENDENT_AMBULATORY_CARE_PROVIDER_SITE_OTHER): Payer: Medicare HMO | Admitting: Family Medicine

## 2019-04-26 VITALS — BP 126/62 | HR 110 | Temp 96.6°F | Resp 16 | Ht 69.0 in | Wt 297.6 lb

## 2019-04-26 DIAGNOSIS — E1129 Type 2 diabetes mellitus with other diabetic kidney complication: Secondary | ICD-10-CM

## 2019-04-26 DIAGNOSIS — K648 Other hemorrhoids: Secondary | ICD-10-CM

## 2019-04-26 DIAGNOSIS — K921 Melena: Secondary | ICD-10-CM

## 2019-04-26 DIAGNOSIS — Z09 Encounter for follow-up examination after completed treatment for conditions other than malignant neoplasm: Secondary | ICD-10-CM | POA: Diagnosis not present

## 2019-04-26 DIAGNOSIS — I700 Atherosclerosis of aorta: Secondary | ICD-10-CM

## 2019-04-26 DIAGNOSIS — R809 Proteinuria, unspecified: Secondary | ICD-10-CM | POA: Diagnosis not present

## 2019-04-26 DIAGNOSIS — K5730 Diverticulosis of large intestine without perforation or abscess without bleeding: Secondary | ICD-10-CM

## 2019-04-26 LAB — POCT GLYCOSYLATED HEMOGLOBIN (HGB A1C): Hemoglobin A1C: 7.7 % — AB (ref 4.0–5.6)

## 2019-04-26 NOTE — Progress Notes (Addendum)
Name: Jordan [name]   MRN: [id number]    DOB: 07/08/1940   Date:04/26/2019       Progress Note  Subjective  Chief Complaint  Chief Complaint  Patient presents with  . Rectal Bleeding    Dx with Diverticulitis    HPI  Hospital discharge follow up: Jordan [name] developed rectal bleeding about 10 days ago , he did not have abdominal pain, however it happened again 6 days ago ( 04/20/2019) while at [facility] he had spontaneous rectal bleeding and employees called 911 because it was all over his pants. He felt weak like he was going to faint. No SOB or chest pain . He was transported to [facility] and was found to be anemic HCT 36.8, BUN at 26 and elevated glucose at 210. AST was elevated at 161 and albumin low at 2.9. Before discharge his BMP was back to normal, HCT down to 31.2 prior to discharge. He had a colonoscopy that showed diverticulosis, internal hemorrhoids but no active bleeding He states no episodes of bleeding since discharge, appetite is normal, no fever or chills. He states no bowel movements but has been passing gas. He is taking miralax as recommended. He states will start ferrous sulfate on the 13 th Feb as recommended by GI. He denies pica.    Patient Active Problem List   Diagnosis Date Noted  . Rectal bleed 04/22/2019  . Rectal bleeding 04/20/2019  . Abnormal liver function 04/20/2019  . Chronic bronchitis ([place]) 07/30/2018  . Bilateral hydrocele 11/27/2015  . Mass, scrotum 11/27/2015  . Ventral hernia without obstruction or gangrene 11/27/2015  . Controlled type 2 diabetes mellitus with microalbuminuria ([place]) 10/26/2015  . 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 Hyperlipidemia 11/07/2014  . Essential hypertension 11/07/2014  . Obesity, Class III, BMI 40-49.9 (morbid obesity) ([place]) 11/07/2014  . Gout 11/07/2014  . 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  . Epistaxis 09/21/2014  . 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   . Aortic valve stenosis 12/02/2012  . Constipation 10/26/2012  . Incisional hernia, without obstruction or gangrene 10/26/2012  . Left inguinal hernia     Past Surgical History:  Procedure Laterality Date  . cataract surgery    . COLONOSCOPY  2012  . COLONOSCOPY N/A 04/22/2019   Procedure: COLONOSCOPY;  Surgeon: Mansouraty, Telford Nab., MD;  Location: [facility];  Service: Gastroenterology;  Laterality: N/A;  . EYE SURGERY  2011   cataract  . UMBILICAL HERNIA REPAIR  2011    Family History  Problem Relation Age of Onset  . Heart disease Father   . Hypertension Father   . Diabetes Sister   . Diabetes Brother   . Prostate cancer Brother   . Diabetes Sister   . Kidney disease Neg Hx   . Kidney cancer Neg Hx   . Bladder Cancer Neg Hx      Current Outpatient Medications:  .  ACCU-CHEK FASTCLIX LANCETS MISC, 1 each by Subdermal route 2 (two) times daily., Disp: 102 each, Rfl: 2 .  allopurinol (ZYLOPRIM) 300 MG tablet, Take 1 tablet (300 mg total) by mouth daily., Disp: 90 tablet, Rfl: 1 .  amLODipine (NORVASC) 5 MG tablet, Take 1 tablet (5 mg total) by mouth daily., Disp: 90 tablet, Rfl: 3 .  aspirin 81 MG tablet, Take 2 tablets (162 mg total) by mouth at bedtime., Disp: 30 tablet, Rfl:  .  atorvastatin (LIPITOR) 40 MG tablet, Take 1 tablet (40 mg total) by mouth daily., Disp: 90  tablet, Rfl: 1 .  Blood Glucose Monitoring Suppl (ACCU-CHEK AVIVA PLUS) w/Device KIT, 1 each by Does not apply route 2 (two) times daily. Use as directed, Disp: 100 kit, Rfl: 12 .  [START ON 04/30/2019] Ferrous Gluconate 324 (37.5 Fe) MG TABS, Take 1 tablet (324 mg total) by mouth daily., Disp: 30 tablet, Rfl: 0 .  glipiZIDE (GLUCOTROL XL) 5 MG 24 hr tablet, Take 1 tablet (5 mg total) by mouth daily with breakfast., Disp: 90 tablet, Rfl: 1 .  glucose blood (ACCU-CHEK AVIVA PLUS) test strip, 1 each by Other route 2 (two) times daily as needed for other. Use as instructed, Disp: 200 strip, Rfl: 2 .   latanoprost (XALATAN) 0.005 % ophthalmic solution, Place 1 drop into the left eye at bedtime. , Disp: , Rfl:  .  metFORMIN (GLUCOPHAGE-XR) 500 MG 24 hr tablet, Take 2 tablets (1,000 mg total) by mouth daily with breakfast., Disp: 90 tablet, Rfl: 1 .  niacin 100 MG tablet, Take 100 mg by mouth at bedtime., Disp: , Rfl:  .  Omega-3 Fatty Acids (FISH OIL PO), Take 1 capsule by mouth 2 (two) times daily. , Disp: , Rfl:  .  polyethylene glycol (MIRALAX / GLYCOLAX) packet, Take 17 g by mouth at bedtime. , Disp: , Rfl:  .  sildenafil (VIAGRA) 100 MG tablet, Take 0.5-1 tablets (50-100 mg total) by mouth daily as needed for erectile dysfunction., Disp: 30 tablet, Rfl: 2 .  spironolactone (ALDACTONE) 50 MG tablet, Take 1 tablet (50 mg total) by mouth daily., Disp: 90 tablet, Rfl: 1  Allergies  Allergen Reactions  . Ace Inhibitors Other (See Comments)    angioedema    I personally reviewed active problem list, medication list, allergies, family history, social history, health maintenance with the patient/caregiver today.   ROS  Constitutional: Negative for fever or weight change.  Respiratory: Negative for cough and shortness of breath.   Cardiovascular: Negative for chest pain or palpitations.  Gastrointestinal: Negative for abdominal pain, no bowel changes.  Musculoskeletal: Negative for gait problem or joint swelling.  Skin: Negative for rash.  Neurological: Negative for dizziness or headache.  No other specific complaints in a complete review of systems (except as listed in HPI above).  Objective  Vitals:   04/26/19 1356  BP: 126/62  Pulse: (!) 110  Resp: 16  Temp: (!) 96.6 F (35.9 C)  TempSrc: Temporal  SpO2: 97%  Weight: 297 lb 9.6 oz (135 kg)  Height: _0  (1.753 m)    Body mass index is 43.95 kg/m.  Physical Exam  Constitutional: Patient appears well-developed and well-nourished. Obese  No distress.  HEENT: head atraumatic, normocephalic, pupils equal and reactive to  light Cardiovascular: Normal rate, regular rhythm and normal heart sounds.  Positive for 2/6  murmur heard. No BLE edema. Pulmonary/Chest: Effort normal and breath sounds normal. No respiratory distress. Abdominal: Soft.  There is no tenderness. Psychiatric: Patient has a normal mood and affect. behavior is normal. Judgment and thought content normal.  Recent Results (from the past 2160 hour(s))  HM DIABETES EYE EXAM     Status: None   Collection Time: 02/21/19 12:00 AM  Result Value Ref Range   HM Diabetic Eye Exam No Retinopathy No Retinopathy    Comment: [facility], Dr. George Ina  POCT HgB A1C     Status: Abnormal   Collection Time: 04/01/19 11:45 AM  Result Value Ref Range   Hemoglobin A1C     HbA1c POC (<> result, manual  entry)     HbA1c, POC (prediabetic range)     HbA1c, POC (controlled diabetic range) 7.6 (A) 0.0 - 7.0 %  Basic metabolic panel     Status: Abnormal   Collection Time: 04/20/19  5:23 PM  Result Value Ref Range   Sodium 136 135 - 145 mmol/L   Potassium 4.4 3.5 - 5.1 mmol/L   Chloride 102 98 - 111 mmol/L   CO2 19 (L) 22 - 32 mmol/L   Glucose, Bld 210 (H) 70 - 99 mg/dL   BUN 26 (H) 8 - 23 mg/dL   Creatinine, Ser 1.29 (H) 0.61 - 1.24 mg/dL   Calcium 9.7 8.9 - 10.3 mg/dL   GFR calc non Af Amer 53 (L) >60 mL/min   GFR calc Af Amer >60 >60 mL/min   Anion gap 15 5 - 15    Comment: Performed at [facility], [place] [address] [place], [place] [address]  CBC with Differential     Status: Abnormal   Collection Time: 04/20/19  5:23 PM  Result Value Ref Range   WBC 9.0 4.0 - 10.5 K/uL   RBC 3.94 (L) 4.22 - 5.81 MIL/uL   Hemoglobin 11.6 (L) 13.0 - 17.0 g/dL   HCT 36.8 (L) 39.0 - 52.0 %   MCV 93.4 80.0 - 100.0 fL   MCH 29.4 26.0 - 34.0 pg   MCHC 31.5 30.0 - 36.0 g/dL   RDW 15.6 (H) 11.5 - 15.5 %   Platelets 225 150 - 400 K/uL   nRBC 0.0 0.0 - 0.2 %   Neutrophils Relative % 77 %   Neutro Abs 6.8 1.7 - 7.7 K/uL   Lymphocytes Relative 14 %    Lymphs Abs 1.3 0.7 - 4.0 K/uL   Monocytes Relative 8 %   Monocytes Absolute 0.8 0.1 - 1.0 K/uL   Eosinophils Relative 0 %   Eosinophils Absolute 0.0 0.0 - 0.5 K/uL   Basophils Relative 0 %   Basophils Absolute 0.0 0.0 - 0.1 K/uL   Immature Granulocytes 1 %   Abs Immature Granulocytes 0.06 0.00 - 0.07 K/uL    Comment: Performed at [facility], [place] [address] [place], [place] [address]  Protime-INR     Status: Abnormal   Collection Time: 04/20/19  5:23 PM  Result Value Ref Range   Prothrombin Time >90.0 (H) 11.4 - 15.2 seconds   INR >10.0 (HH) 0.8 - 1.2    Comment: REPEATED TO VERIFY CRITICAL RESULT CALLED TO, READ BACK BY AND VERIFIED WITH: C.FRANKLIN AT 1940 ON 04/20/19 BY N.THOMPSON (NOTE) INR goal varies based on device and disease states. Performed at [facility], [place] [address], [place], [place] [address]   Type and screen [place]     Status: None   Collection Time: 04/20/19  5:23 PM  Result Value Ref Range   ABO/RH(D) A POS    Antibody Screen NEG    Sample Expiration      04/23/2019,2359 Performed at [facility], [place] [address] [place], [place] [address]   ABO/Rh     Status: None   Collection Time: 04/20/19  5:23 PM  Result Value Ref Range   ABO/RH(D)      A POS Performed at [facility], [place] [address], [place], [place] [address]   Hepatitis panel, acute     Status: None   Collection Time: 04/20/19  6:00 PM  Result Value Ref Range   Hepatitis  B Surface Ag NON REACTIVE NON REACTIVE   HCV Ab NON REACTIVE NON REACTIVE    Comment: (NOTE) Nonreactive HCV antibody screen is consistent with no HCV infections,  unless recent infection is suspected or other evidence exists to indicate HCV infection.    Hep A IgM NON REACTIVE NON REACTIVE   Hep B C IgM NON REACTIVE NON REACTIVE    Comment: Performed at [facility], [place] [address] [place], [place] [address]   Hepatic function panel     Status: Abnormal   Collection Time: 04/20/19  7:55 PM  Result Value Ref Range   Total Protein 7.4 6.5 - 8.1 g/dL   Albumin 2.9 (L) 3.5 - 5.0 g/dL   AST 161 (H) 15 - 41 U/L   ALT 39 0 - 44 U/L   Alkaline Phosphatase 166 (H) 38 - 126 U/L   Total Bilirubin 7.6 (H) 0.3 - 1.2 mg/dL   Bilirubin, Direct 4.2 (H) 0.0 - 0.2 mg/dL   Indirect Bilirubin 3.4 (H) 0.3 - 0.9 mg/dL    Comment: Performed at [facility], [place] [address] [place], [place] [address]  Protime-INR     Status: Abnormal   Collection Time: 04/20/19  7:55 PM  Result Value Ref Range   Prothrombin Time 15.9 (H) 11.4 - 15.2 seconds   INR 1.3 (H) 0.8 - 1.2    Comment: (NOTE) INR goal varies based on device and disease states. Performed at [facility], [place] [address], [place], [place] [address]   Acetaminophen level     Status: Abnormal   Collection Time: 04/20/19  7:55 PM  Result Value Ref Range   Acetaminophen (Tylenol), Serum <10 (L) 10 - 30 ug/mL    Comment: (NOTE) Therapeutic concentrations vary significantly. A range of 10-30 ug/mL  may be an effective concentration for many patients. However, some  are best treated at concentrations outside of this range. Acetaminophen concentrations >150 ug/mL at 4 hours after ingestion  and >50 ug/mL at 12 hours after ingestion are often associated with  toxic reactions. Performed at [facility], [place] [address], [place], [place] [address]   SARS CORONAVIRUS 2 (TAT 6-24 HRS) Nasopharyngeal Nasopharyngeal Swab     Status: None   Collection Time: 04/20/19  9:07 PM   Specimen: Nasopharyngeal Swab  Result Value Ref Range   SARS Coronavirus 2 NEGATIVE NEGATIVE    Comment: (NOTE) SARS-CoV-2 target nucleic acids are NOT DETECTED. The SARS-CoV-2 RNA is generally detectable in upper and lower respiratory specimens during the acute phase of infection. Negative results do not preclude SARS-CoV-2  infection, do not rule out co-infections with other pathogens, and should not be used as the sole basis for treatment or other patient management decisions. Negative results must be combined with clinical observations, patient history, and epidemiological information. The expected result is Negative. Fact Sheet for Patients: SugarRoll.be Fact Sheet for Healthcare Providers: https://www.woods-mathews.com/ This test is not yet approved or cleared by the [place] FDA and  has been authorized for detection and/or diagnosis of SARS-CoV-2 by FDA under an Emergency Use Authorization (EUA). This EUA will remain  in effect (meaning this test can be used) for the duration of the COVID-19 declaration under Section 56 4(b)(1) of the Act, 21 U.S.C. section 360bbb-3(b)(1), unless the authorization is terminated or revoked sooner. Performed at [facility], [place] [address] [place], [place] [address]   Glucose, capillary     Status: Abnormal   Collection  Time: 04/20/19 11:46 PM  Result Value Ref Range   Glucose-Capillary 134 (H) 70 - 99 mg/dL  Glucose, capillary     Status: Abnormal   Collection Time: 04/21/19  4:36 AM  Result Value Ref Range   Glucose-Capillary 133 (H) 70 - 99 mg/dL  Comprehensive metabolic panel     Status: Abnormal   Collection Time: 04/21/19  5:43 AM  Result Value Ref Range   Sodium 135 135 - 145 mmol/L   Potassium 4.2 3.5 - 5.1 mmol/L   Chloride 103 98 - 111 mmol/L   CO2 25 22 - 32 mmol/L   Glucose, Bld 145 (H) 70 - 99 mg/dL   BUN 26 (H) 8 - 23 mg/dL   Creatinine, Ser 0.89 0.61 - 1.24 mg/dL   Calcium 9.3 8.9 - 10.3 mg/dL   Total Protein 7.0 6.5 - 8.1 g/dL   Albumin 3.6 3.5 - 5.0 g/dL   AST 20 15 - 41 U/L    Comment: REPEATED TO VERIFY   ALT 14 0 - 44 U/L    Comment: REPEATED TO VERIFY   Alkaline Phosphatase 47 38 - 126 U/L    Comment: REPEATED TO VERIFY   Total Bilirubin 0.9 0.3 - 1.2 mg/dL    Comment: DELTA  CHECK NOTED REPEATED TO VERIFY    GFR calc non Af Amer >60 >60 mL/min   GFR calc Af Amer >60 >60 mL/min   Anion gap 7 5 - 15    Comment: Performed at [facility], [place] [address] [place], [place] [address]  CBC     Status: Abnormal   Collection Time: 04/21/19  5:43 AM  Result Value Ref Range   WBC 6.5 4.0 - 10.5 K/uL   RBC 3.42 (L) 4.22 - 5.81 MIL/uL   Hemoglobin 10.0 (L) 13.0 - 17.0 g/dL   HCT 31.6 (L) 39.0 - 52.0 %   MCV 92.4 80.0 - 100.0 fL   MCH 29.2 26.0 - 34.0 pg   MCHC 31.6 30.0 - 36.0 g/dL   RDW 15.7 (H) 11.5 - 15.5 %   Platelets 178 150 - 400 K/uL   nRBC 0.0 0.0 - 0.2 %    Comment: Performed at [facility], [place] [address] [place], [place] [address]  Glucose, capillary     Status: Abnormal   Collection Time: 04/21/19  7:59 AM  Result Value Ref Range   Glucose-Capillary 138 (H) 70 - 99 mg/dL  Glucose, capillary     Status: Abnormal   Collection Time: 04/21/19 12:33 PM  Result Value Ref Range   Glucose-Capillary 203 (H) 70 - 99 mg/dL  Glucose, capillary     Status: Abnormal   Collection Time: 04/21/19  4:13 PM  Result Value Ref Range   Glucose-Capillary 372 (H) 70 - 99 mg/dL  Hemoglobin and hematocrit, blood     Status: Abnormal   Collection Time: 04/21/19  4:36 PM  Result Value Ref Range   Hemoglobin 10.0 (L) 13.0 - 17.0 g/dL   HCT 31.2 (L) 39.0 - 52.0 %    Comment: Performed at [facility], [place] [address], [place], [place] [address]  Glucose, capillary     Status: None   Collection Time: 04/21/19  9:31 PM  Result Value Ref Range   Glucose-Capillary 90 70 - 99 mg/dL  Glucose, capillary     Status: Abnormal   Collection Time: 04/22/19 12:05 AM  Result Value Ref Range   Glucose-Capillary 150 (H) 70 - 99  mg/dL  Surgical pcr screen     Status: Abnormal   Collection Time: 04/22/19 12:09 AM   Specimen: Nasal Mucosa; Nasal Swab  Result Value Ref Range   MRSA, PCR NEGATIVE NEGATIVE   Staphylococcus aureus  POSITIVE (A) NEGATIVE    Comment: (NOTE) The Xpert SA Assay (FDA approved for NASAL specimens in patients 68 years of age and older), is one component of a comprehensive surveillance program. It is not intended to diagnose infection nor to guide or monitor treatment. Performed at [facility], [place] [address], [place], [place] [address]   Glucose, capillary     Status: Abnormal   Collection Time: 04/22/19  4:18 AM  Result Value Ref Range   Glucose-Capillary 145 (H) 70 - 99 mg/dL  Hemoglobin and hematocrit, blood     Status: Abnormal   Collection Time: 04/22/19  5:33 AM  Result Value Ref Range   Hemoglobin 10.2 (L) 13.0 - 17.0 g/dL   HCT 32.0 (L) 39.0 - 52.0 %    Comment: Performed at [facility], [place] [address] [place], [place] [address]  Basic metabolic panel Once     Status: Abnormal   Collection Time: 04/22/19  5:33 AM  Result Value Ref Range   Sodium 137 135 - 145 mmol/L   Potassium 4.5 3.5 - 5.1 mmol/L   Chloride 104 98 - 111 mmol/L   CO2 23 22 - 32 mmol/L   Glucose, Bld 152 (H) 70 - 99 mg/dL   BUN 15 8 - 23 mg/dL   Creatinine, Ser 0.89 0.61 - 1.24 mg/dL   Calcium 9.2 8.9 - 10.3 mg/dL   GFR calc non Af Amer >60 >60 mL/min   GFR calc Af Amer >60 >60 mL/min   Anion gap 10 5 - 15    Comment: Performed at [facility], [place] [address] [place], [place] [address]  Glucose, capillary     Status: Abnormal   Collection Time: 04/22/19  7:53 AM  Result Value Ref Range   Glucose-Capillary 150 (H) 70 - 99 mg/dL  Glucose, capillary     Status: Abnormal   Collection Time: 04/22/19 10:58 AM  Result Value Ref Range   Glucose-Capillary 169 (H) 70 - 99 mg/dL  Glucose, capillary     Status: Abnormal   Collection Time: 04/22/19 11:46 AM  Result Value Ref Range   Glucose-Capillary 171 (H) 70 - 99 mg/dL   Comment 1 Notify RN    Comment 2 Document in Chart   Glucose, capillary     Status: Abnormal   Collection Time: 04/22/19   4:01 PM  Result Value Ref Range   Glucose-Capillary 201 (H) 70 - 99 mg/dL   Comment 1 Notify RN    Comment 2 Document in Chart   Glucose, capillary     Status: Abnormal   Collection Time: 04/23/19  7:09 AM  Result Value Ref Range   Glucose-Capillary 152 (H) 70 - 99 mg/dL      PHQ2/9: Depression screen [facility] 2/9 04/01/2019 11/30/2018 07/30/2018 11/25/2017 08/19/2016  Decreased Interest 0 3 0 0 0  Down, Depressed, Hopeless 0 0 0 0 0  PHQ - 2 Score 0 3 0 0 0  Altered sleeping 0 1 0 - -  Tired, decreased energy 0 1 0 - -  Change in appetite 0 2 0 - -  Feeling bad or failure about yourself  0 2 0 - -  Trouble concentrating 0 1 0 - -  Moving slowly or fidgety/restless 0 0 0 - -  Suicidal thoughts 0 0 0 - -  PHQ-9 Score 0 10 0 - -  Difficult doing work/chores - Not difficult at all - - -    phq 9 is negative   Fall Risk: Fall Risk  04/01/2019 11/30/2018 07/30/2018 03/29/2018 11/25/2017  Falls in the past year? 0 0 0 0 No  Number falls in past yr: 0 0 0 0 -  Injury with Fall? 0 0 0 0 -  Risk for fall due to : - - - - -  Follow up - - - - -      Assessment & Plan  1. Type 2 diabetes mellitus with microalbuminuria, without long-term current use of insulin (HCC)  - POCT HgB A1C  2. Hospital discharge follow-up   3. Atherosclerosis of aorta [facility])  Discussed it with patient, continue statin therapy   4. Hematochezia  - CBC with Differential/Platelet - COMPLETE METABOLIC PANEL WITH GFR  5. Diverticulosis of colon   6. Internal hemorrhoids

## 2019-05-03 DIAGNOSIS — K921 Melena: Secondary | ICD-10-CM | POA: Diagnosis not present

## 2019-05-04 ENCOUNTER — Other Ambulatory Visit: Payer: Self-pay | Admitting: Family Medicine

## 2019-05-04 DIAGNOSIS — E875 Hyperkalemia: Secondary | ICD-10-CM

## 2019-05-04 LAB — CBC WITH DIFFERENTIAL/PLATELET
Absolute Monocytes: 482 cells/uL (ref 200–950)
Basophils Absolute: 18 cells/uL (ref 0–200)
Basophils Relative: 0.3 %
Eosinophils Absolute: 140 cells/uL (ref 15–500)
Eosinophils Relative: 2.3 %
HCT: 35.2 % — ABNORMAL LOW (ref 38.5–50.0)
Hemoglobin: 11.7 g/dL — ABNORMAL LOW (ref 13.2–17.1)
Lymphs Abs: 1305 cells/uL (ref 850–3900)
MCH: 30.3 pg (ref 27.0–33.0)
MCHC: 33.2 g/dL (ref 32.0–36.0)
MCV: 91.2 fL (ref 80.0–100.0)
MPV: 11.3 fL (ref 7.5–12.5)
Monocytes Relative: 7.9 %
Neutro Abs: 4154 cells/uL (ref 1500–7800)
Neutrophils Relative %: 68.1 %
Platelets: 250 10*3/uL (ref 140–400)
RBC: 3.86 10*6/uL — ABNORMAL LOW (ref 4.20–5.80)
RDW: 16.1 % — ABNORMAL HIGH (ref 11.0–15.0)
Total Lymphocyte: 21.4 %
WBC: 6.1 10*3/uL (ref 3.8–10.8)

## 2019-05-04 LAB — COMPLETE METABOLIC PANEL WITH GFR
AG Ratio: 1.2 (calc) (ref 1.0–2.5)
ALT: 12 U/L (ref 9–46)
AST: 19 U/L (ref 10–35)
Albumin: 4 g/dL (ref 3.6–5.1)
Alkaline phosphatase (APISO): 61 U/L (ref 35–144)
BUN: 16 mg/dL (ref 7–25)
CO2: 25 mmol/L (ref 20–32)
Calcium: 10.2 mg/dL (ref 8.6–10.3)
Chloride: 103 mmol/L (ref 98–110)
Creat: 0.95 mg/dL (ref 0.70–1.18)
GFR, Est African American: 89 mL/min/{1.73_m2} (ref >=60)
GFR, Est Non African American: 77 mL/min/{1.73_m2} (ref >=60)
Globulin: 3.4 g/dL (calc) (ref 1.9–3.7)
Glucose, Bld: 183 mg/dL — ABNORMAL HIGH (ref 65–99)
Potassium: 5.5 mmol/L — ABNORMAL HIGH (ref 3.5–5.3)
Sodium: 136 mmol/L (ref 135–146)
Total Bilirubin: 0.6 mg/dL (ref 0.2–1.2)
Total Protein: 7.4 g/dL (ref 6.1–8.1)

## 2019-05-11 DIAGNOSIS — E875 Hyperkalemia: Secondary | ICD-10-CM | POA: Diagnosis not present

## 2019-05-11 LAB — POTASSIUM: Potassium: 5.7 mmol/L — ABNORMAL HIGH (ref 3.5–5.3)

## 2019-05-12 ENCOUNTER — Other Ambulatory Visit: Payer: Self-pay | Admitting: Family Medicine

## 2019-05-12 DIAGNOSIS — I10 Essential (primary) hypertension: Secondary | ICD-10-CM

## 2019-05-12 DIAGNOSIS — E875 Hyperkalemia: Secondary | ICD-10-CM

## 2019-05-12 MED ORDER — AMLODIPINE BESYLATE 10 MG PO TABS: 10.0000 mg | ORAL_TABLET | Freq: Every day | ORAL | 0 refills | Status: DC

## 2019-05-19 ENCOUNTER — Ambulatory Visit: Payer: Medicare PPO | Admitting: Podiatry

## 2019-05-20 ENCOUNTER — Ambulatory Visit: Payer: Medicare HMO

## 2019-05-20 ENCOUNTER — Other Ambulatory Visit: Payer: Self-pay

## 2019-05-20 VITALS — BP 122/74 | HR 85

## 2019-05-20 DIAGNOSIS — I10 Essential (primary) hypertension: Secondary | ICD-10-CM

## 2019-05-20 DIAGNOSIS — E875 Hyperkalemia: Secondary | ICD-10-CM

## 2019-05-20 NOTE — Progress Notes (Signed)
Pt here for bp check since stopping aldactone for high potassium.  He is currently taking Norvasc 10mg  no side effects.  Jordan [name] today is 122/74.  He will also get repeat labs.  He wanted to let you know he is taking centrum multi vitamin that does have potassium in it.

## 2019-05-21 LAB — POTASSIUM: Potassium: 4.8 mmol/L (ref 3.5–5.3)

## 2019-05-23 ENCOUNTER — Encounter: Payer: Self-pay | Admitting: Podiatry

## 2019-05-23 ENCOUNTER — Ambulatory Visit (INDEPENDENT_AMBULATORY_CARE_PROVIDER_SITE_OTHER): Payer: Medicare HMO | Admitting: Podiatry

## 2019-05-23 ENCOUNTER — Other Ambulatory Visit: Payer: Self-pay

## 2019-05-23 VITALS — Temp 97.3°F

## 2019-05-23 DIAGNOSIS — L84 Corns and callosities: Secondary | ICD-10-CM | POA: Diagnosis not present

## 2019-05-23 DIAGNOSIS — M79676 Pain in unspecified toe(s): Secondary | ICD-10-CM | POA: Diagnosis not present

## 2019-05-23 DIAGNOSIS — E1142 Type 2 diabetes mellitus with diabetic polyneuropathy: Secondary | ICD-10-CM

## 2019-05-23 DIAGNOSIS — B351 Tinea unguium: Secondary | ICD-10-CM

## 2019-05-23 DIAGNOSIS — N1831 Chronic kidney disease, stage 3a: Secondary | ICD-10-CM | POA: Insufficient documentation

## 2019-05-23 NOTE — Progress Notes (Signed)
Complaint:  Visit Type: Patient returns to my office for risk foot care.  This patient requires this care by a professional since this patient will be at a high risk due to having diabetes.  This patient is unable to cut his own nails since he cannot reach his nails.  This patient presents for at risk foot care today.  He also states that he has a painful corn on the second toe right foot.  Podiatric Exam: Vascular: dorsalis pedis and posterior tibial pulses are palpable bilateral. Capillary return is immediate. Temperature gradient is WNL. Skin turgor WNL  Sensorium: Normal Semmes Weinstein monofilament test. Normal tactile sensation bilaterally. Nail Exam: Pt has thick disfigured discolored nails with subungual debris noted bilateral entire nail hallux through fifth toenails.  Nails are thick painful and deformed 1-5  B/L. Ulcer Exam: There is no evidence of ulcer or pre-ulcerative changes or infection. Orthopedic Exam: Muscle tone and strength are WNL. No limitations in general ROM. No crepitus or effusions noted. HAV  B/L. Hammer toes second  B/L Skin: No Porokeratosis. No infection or ulcers.  Corn second toe medial aspect @ PIPJ.   Diagnosis:  Onychomycosis, , Pain in right toe, pain in left toes, Corn/callus  Treatment & Plan Procedures and Treatment: Consent by patient was obtained for treatment procedures. The patient understood the discussion of treatment and procedures well. All questions were answered thoroughly reviewed. Debridement of mycotic and hypertrophic toenails, 1 through 5 bilateral and clearing of subungual debris. No ulceration, no infection noted.   Debride corn second toe right foot.  Told this patient to return for periodic foot evaluation to help reduce potential of at risk complications. Return Visit-Office Procedure: Patient instructed to return to the office for a follow up visit 3 months for continued evaluation and treatment.    Gardiner Barefoot DPM

## 2019-05-24 ENCOUNTER — Encounter: Payer: Self-pay | Admitting: Gastroenterology

## 2019-05-24 ENCOUNTER — Ambulatory Visit: Payer: Medicare HMO | Admitting: Gastroenterology

## 2019-05-24 VITALS — BP 124/72 | HR 97 | Temp 97.8°F | Ht 69.0 in | Wt 298.0 lb

## 2019-05-24 DIAGNOSIS — K5731 Diverticulosis of large intestine without perforation or abscess with bleeding: Secondary | ICD-10-CM | POA: Diagnosis not present

## 2019-05-24 DIAGNOSIS — Z862 Personal history of diseases of the blood and blood-forming organs and certain disorders involving the immune mechanism: Secondary | ICD-10-CM | POA: Diagnosis not present

## 2019-05-24 DIAGNOSIS — Z1211 Encounter for screening for malignant neoplasm of colon: Secondary | ICD-10-CM | POA: Diagnosis not present

## 2019-05-24 MED ORDER — FERROUS GLUCONATE 324 (37.5 FE) MG PO TABS: 1.0000 | ORAL_TABLET | Freq: Every day | ORAL | 3 refills | Status: DC

## 2019-05-24 NOTE — Patient Instructions (Addendum)
It has been recommended to you by your physician that you have a(n) colonoscopy in hospital setting completed in 3-4 months. Our office will contact you at a later time to schedule. If you have not heard from [name] in 6-8 weeks please contact office at [phone].  Continue Iron supplements.   We have sent the following medications to your pharmacy for you to pick up at your convenience: Ferrous Gluconate   We will try to obtain records from primary care physician.   Thank you for choosing me and [facility].  Dr. Rush Landmark

## 2019-05-26 ENCOUNTER — Encounter: Payer: Self-pay | Admitting: Gastroenterology

## 2019-05-26 DIAGNOSIS — K5731 Diverticulosis of large intestine without perforation or abscess with bleeding: Secondary | ICD-10-CM | POA: Insufficient documentation

## 2019-05-26 DIAGNOSIS — Z862 Personal history of diseases of the blood and blood-forming organs and certain disorders involving the immune mechanism: Secondary | ICD-10-CM | POA: Insufficient documentation

## 2019-05-26 NOTE — Progress Notes (Signed)
[place] VISIT   Primary Care Provider Steele Sizer, [address] [place] [address] [phone]  Patient Profile: Jordan [name] is a 79 y.o. male with a pmh significant for ventral hernia repairs, chronic renal insufficiency, diabetes, glaucoma, gout, hypertension, hyperlipidemia, inguinal hernias, obesity, diverticulosis (with recent hospital admission for diverticular bleeding).  The patient presents to the [facility] for an evaluation and management of problem(s) noted below:  Problem List 1. Diverticulosis of colon with hemorrhage   2. History of anemia   3. Colon cancer screening     History of Present Illness This is the patient's first visit to the outpatient [facility].  I met the patient in the beginning of February 2021 after he presented with multiple episodes of bright red blood per rectum.  Patient had a previous incomplete colonoscopy leading to apparent MI in 2012 with findings of significant diverticular disease in the left colon as well as pandiverticulosis.  Anoscopy report there.  Concern as a result of the patient's long history without a colonoscopy was to ensure that we did not miss anything more than a diverticular hemorrhage and also to examine therapeutic intervention if a diverticular hemorrhage was noted or other etiology more lower GI bleeding.  Patient underwent upper endoscopy and colonoscopy.  Colonoscopy showed significant diverticulosis and the patient unfortunately had a inadequate preparation for colon cancer screening/colon polyp evaluation.  Patient stabilized and was able to be discharged.  Today, the patient returns for scheduled follow-up.  He otherwise has been feeling well.  The patient states that his primary care doctor has been checking his blood counts most recently within the last few days.  We do not have access to those records.  GI Review of Systems Positive as  above Negative for dysphagia, odynophagia, nausea, vomiting, melena, recurrent hematochezia  Review of Systems General: Denies fevers/chills/weight loss Cardiovascular: Denies chest pain/palpitations Pulmonary: Denies shortness of breath/nocturnal cough Gastroenterological: See HPI Genitourinary: Denies darkened urine or hematuria Hematological: Denies easy bruising/bleeding Dermatological: Denies jaundice Psychological: Mood is stable   Medications Current Outpatient Medications  Medication Sig Dispense Refill  . ACCU-CHEK FASTCLIX LANCETS MISC 1 each by Subdermal route 2 (two) times daily. 102 each 2  . allopurinol (ZYLOPRIM) 300 MG tablet Take 1 tablet (300 mg total) by mouth daily. 90 tablet 1  . amLODipine (NORVASC) 10 MG tablet Take 1 tablet (10 mg total) by mouth daily. New dose 90 tablet 0  . aspirin 81 MG tablet Take 2 tablets (162 mg total) by mouth at bedtime. 30 tablet   . atorvastatin (LIPITOR) 40 MG tablet Take 1 tablet (40 mg total) by mouth daily. 90 tablet 1  . Blood Glucose Monitoring Suppl (ACCU-CHEK AVIVA PLUS) w/Device KIT 1 each by Does not apply route 2 (two) times daily. Use as directed 100 kit 12  . Ferrous Gluconate 324 (37.5 Fe) MG TABS Take 1 tablet (324 mg total) by mouth daily. 30 tablet 3  . glipiZIDE (GLUCOTROL XL) 5 MG 24 hr tablet Take 1 tablet (5 mg total) by mouth daily with breakfast. 90 tablet 1  . glucose blood (ACCU-CHEK AVIVA PLUS) test strip 1 each by Other route 2 (two) times daily as needed for other. Use as instructed 200 strip 2  . latanoprost (XALATAN) 0.005 % ophthalmic solution Place 1 drop into the left eye at bedtime.     . metFORMIN (GLUCOPHAGE-XR) 500 MG 24 hr tablet Take 2 tablets (1,000 mg total) by  mouth daily with breakfast. 90 tablet 1  . niacin 100 MG tablet Take 100 mg by mouth at bedtime.    . Omega-3 Fatty Acids (FISH OIL PO) Take 1 capsule by mouth 2 (two) times daily.     . polyethylene glycol (MIRALAX / GLYCOLAX) packet  Take 17 g by mouth at bedtime.     . sildenafil (VIAGRA) 100 MG tablet Take 0.5-1 tablets (50-100 mg total) by mouth daily as needed for erectile dysfunction. 30 tablet 2   No current facility-administered medications for this visit.    Allergies Allergies  Allergen Reactions  . Ace Inhibitors Other (See Comments)    angioedema    Histories Past Medical History:  Diagnosis Date  . Aortic stenosis   . Chronic kidney disease, stage II (mild)   . Decreased libido   . Diabetes mellitus without complication ([place])   . Glaucoma   . Gout 2009  . Hernia 6578,4696  . Hyperlipidemia   . Hypertension   . Inguinal hernia without mention of obstruction or gangrene, unilateral or unspecified, (not specified as recurrent)   . Lumbago   . Obesity   . Unspecified constipation    Past Surgical History:  Procedure Laterality Date  . cataract surgery    . COLONOSCOPY  2012  . COLONOSCOPY N/A 04/22/2019   Procedure: COLONOSCOPY;  Surgeon: Mansouraty, Telford Nab., MD;  Location: [facility];  Service: Gastroenterology;  Laterality: N/A;  . EYE SURGERY  2011   cataract  . UMBILICAL HERNIA REPAIR  2011   Social History   Socioeconomic History  . Marital status: Widowed    Spouse name: Not on file  . Number of children: 2  . Years of education: Not on file  . Highest education level: Some college, no degree  Occupational History  . Occupation: retired     Comment: used to [employer]   Tobacco Use  . Smoking status: Former Smoker    Packs/day: 1.00    Years: 10.00    Pack years: 10.00    Types: Cigarettes    Start date: 03/17/1965    Quit date: 11/27/1975    Years since quitting: 43.5  . Smokeless tobacco: Never Used  . Tobacco comment: quit 40 years  Substance and Sexual Activity  . Alcohol use: Yes    Alcohol/week: 1.0 standard drinks    Types: 1 Standard drinks or equivalent per week    Comment: socially - 1 x year  . Drug use: No  . Sexual activity: Yes     Partners: Female  Other Topics Concern  . Not on file  Social History Narrative   Lives by himself in [place]   Two grown children, daughter in [place] and son in [place]   He goes to [place] to visit his sisters often    Social Determinants of Health   Financial Resource Strain: Medium Risk  . Difficulty of Paying Living Expenses: Somewhat hard  Food Insecurity: No Food Insecurity  . Worried About Charity fundraiser in the Last Year: Never true  . Ran Out of Food in the Last Year: Never true  Transportation Needs: No Transportation Needs  . Lack of Transportation (Medical): No  . Lack of Transportation (Non-Medical): No  Physical Activity: Sufficiently Active  . Days of Exercise per Week: 5 days  . Minutes of Exercise per Session: 30 min  Stress: No Stress Concern Present  . Feeling of Stress : Not at all  Social Connections: Slightly Isolated  .  Frequency of Communication with Friends and Family: More than three times a week  . Frequency of Social Gatherings with Friends and Family: More than three times a week  . Attends Religious Services: More than 4 times per year  . Active Member of Clubs or Organizations: Yes  . Attends Archivist Meetings: More than 4 times per year  . Marital Status: Widowed  Intimate Partner Violence: Not At Risk  . Fear of Current or Ex-Partner: No  . Emotionally Abused: No  . Physically Abused: No  . Sexually Abused: No   Family History  Problem Relation Age of Onset  . Heart disease Father   . Hypertension Father   . Diabetes Sister   . Diabetes Brother   . Prostate cancer Brother   . Diabetes Sister   . Kidney disease Neg Hx   . Kidney cancer Neg Hx   . Bladder Cancer Neg Hx   . Colon cancer Neg Hx   . Esophageal cancer Neg Hx   . Inflammatory bowel disease Neg Hx   . Liver disease Neg Hx   . Pancreatic cancer Neg Hx   . Rectal cancer Neg Hx   . Stomach cancer Neg Hx    I have reviewed his medical, social, and  family history in detail and updated the electronic medical record as necessary.    PHYSICAL EXAMINATION  BP 124/72 (BP Location: Left Arm, Patient Position: Sitting, Cuff Size: Large)   Pulse 97   Temp 97.8 F (36.6 C)   Ht 5' 9" (1.753 m)   Wt 298 lb (135.2 kg)   SpO2 98%   BMI 44.01 kg/m  Wt Readings from Last 3 Encounters:  05/24/19 298 lb (135.2 kg)  04/26/19 297 lb 9.6 oz (135 kg)  04/23/19 297 lb 1.6 oz (134.8 kg)  GEN: NAD, appears stated age, doesn't appear chronically ill PSYCH: Cooperative, without pressured speech EYE: Conjunctivae pink, sclerae anicteric ENT: MMM CV: Nontachycardic RESP: Decreased breath sounds at the bases bilaterally GI: NABS, soft, obese, rounded, NT, without rebound or guarding, unable to appreciate hepatosplenomegaly due to body habitus MSK/EXT: Bilateral lower extremity edema present SKIN: No jaundice NEURO:  Alert & Oriented x 3, no focal deficits   REVIEW OF DATA  I reviewed the following data at the time of this encounter:  GI Procedures and Studies  February 2021 colonoscopy - Hemorrhoids found on digital rectal exam. - Redundant colon. - Stool in the entire examined colon. Lavaged with fair preparation. - Diverticulosis in the entire examined colon. - Normal mucosa in the entire examined colon. - Non-bleeding non-thrombosed internal hemorrhoids. - Etiology of bleeding most likely diverticular in origin.  Laboratory Studies  Reviewed those in epic  Imaging Studies  February 2021 CT abdomen pelvis with contrast IMPRESSION: 1. No acute intra-abdominal or pelvic pathology. 2. Severe sigmoid and diffuse colonic diverticulosis. No bowel obstruction or active inflammation. Normal appendix. No definite evidence of active diverticular bleed by CT. 3. 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 4. 3 vessel coronary vascular calcification and advanced Aortic Atherosclerosis (ICD10-I70.0).   ASSESSMENT   Mr. Scally is a 79 y.o. male with a pmh significant for ventral hernia repairs, chronic renal insufficiency, diabetes, glaucoma, gout, hypertension, hyperlipidemia, inguinal hernias, obesity, diverticulosis (with recent hospital admission for diverticular bleeding).  The patient is seen today for evaluation and management of:  1. Diverticulosis of colon with hemorrhage   2. History of anemia   3. Colon cancer  screening    The patient is clinically and hemodynamically stable.  Most recent hospitalization was most likely the result of a diverticular hemorrhage.  Had anemia but states he has had blood counts checked by his primary care provider recently and they have not shown evidence of progressive anemia though he cannot tell me the number.  We will try to work on getting these records otherwise we will have the patient come in to obtain laboratories.  We will initiate the patient on oral iron once daily.  If the patient has had iron deficiency labs checked and he is no longer deficient then may not need or require further IV iron infusions.  For now we will increase his iron by having him start iron supplementation once daily.  He needs a full colonoscopy within the next 3 to 6 months as a result of the patient's poor preparation during hospital colonoscopy to ensure that no other flat lesions or masses or polyps were missed.  The risks and benefits of endoscopic evaluation were discussed with the patient; these include but are not limited to the risk of perforation, infection, bleeding, missed lesions, lack of diagnosis, severe illness requiring hospitalization, as well as anesthesia and sedation related illnesses.  The patient is agreeable to proceed in the coming months.  All patient questions were answered, to the best of my ability, and the patient agrees to the aforementioned plan of action with follow-up as indicated.   PLAN  Start ferrous gluconate 324 mg once daily May consider IV iron  infusion based on laboratories Obtain PCP records and if he does not have recent CBC/iron indices we will need to obtain those. Repeat colonoscopy within the next 3 to 6 months for colorectal cancer screening and surveillance.   No orders of the defined types were placed in this encounter.   New Prescriptions   No medications on file   Modified Medications   Modified Medication Previous Medication   FERROUS GLUCONATE 324 (37.5 FE) MG TABS Ferrous Gluconate 324 (37.5 Fe) MG TABS      Take 1 tablet (324 mg total) by mouth daily.    Take 1 tablet (324 mg total) by mouth daily.    Planned Follow Up No follow-ups on file.   Total Time in Face-to-Face and in Coordination of Care for patient including independent/personal interpretation/review of prior testing, medical history, examination, medication adjustment, communicating results with the patient directly, and documentation with the EHR is 25 minutes.Justice Britain, MD [facility] Office # [phone]

## 2019-06-17 ENCOUNTER — Other Ambulatory Visit: Payer: Self-pay | Admitting: Family Medicine

## 2019-06-17 DIAGNOSIS — E1140 Type 2 diabetes mellitus with diabetic neuropathy, unspecified: Secondary | ICD-10-CM

## 2019-06-17 MED ORDER — METFORMIN HCL ER 500 MG PO TB24: 1000.0000 mg | ORAL_TABLET | Freq: Every day | ORAL | 0 refills | Status: DC

## 2019-06-17 NOTE — Telephone Encounter (Signed)
Medication: metFORMIN (GLUCOPHAGE-XR) 500 MG 24 hr tablet [id number]   Has the patient contacted their pharmacy? Yes  (Agent: If no, request that the patient contact the pharmacy for the refill.) (Agent: If yes, when and what did the pharmacy advise?)  Preferred Pharmacy (with phone number or street name): [place], [place]  Phone:  [phone] Fax:  [phone]     Agent: Please be advised that RX refills may take up to 3 business days. We ask that you follow-up with your pharmacy.

## 2019-07-18 NOTE — Telephone Encounter (Signed)
Error

## 2019-07-19 ENCOUNTER — Other Ambulatory Visit: Payer: Self-pay | Admitting: Family Medicine

## 2019-07-19 DIAGNOSIS — E1140 Type 2 diabetes mellitus with diabetic neuropathy, unspecified: Secondary | ICD-10-CM

## 2019-07-19 NOTE — Telephone Encounter (Signed)
Requested Prescriptions  Pending Prescriptions Disp Refills  . metFORMIN (GLUCOPHAGE-XR) 500 MG 24 hr tablet [Pharmacy Med Name: METFORMIN HYDROCHLORIDE ER 500 MG Tablet Extended Release 24 Hour] 180 tablet 1    Sig: TAKE 2 TABLETS  DAILY WITH BREAKFAST.     Endocrinology:  Diabetes - Biguanides Passed - 07/19/2019  9:40 PM      Passed - Cr in normal range and within 360 days    Creat  Date Value Ref Range Status  05/03/2019 0.95 0.70 - 1.18 mg/dL Final    Comment:    For patients >70 years of age, the reference limit for Creatinine is approximately 13% higher for people identified as African-American. .    Creatinine, Urine  Date Value Ref Range Status  11/30/2018 147 20 - 320 mg/dL Final         Passed - HBA1C is between 0 and 7.9 and within 180 days    Hemoglobin A1C  Date Value Ref Range Status  04/26/2019 7.7 (A) 4.0 - 5.6 % Final   HbA1c, POC (prediabetic range)  Date Value Ref Range Status  03/29/2018 6.3 5.7 - 6.4 % Final   HbA1c, POC (controlled diabetic range)  Date Value Ref Range Status  04/01/2019 7.6 (A) 0.0 - 7.0 % Final         Passed - eGFR in normal range and within 360 days    GFR, Est African American  Date Value Ref Range Status  05/03/2019 89 > OR = 60 mL/min/1.76m Final   GFR, Est Non African American  Date Value Ref Range Status  05/03/2019 77 > OR = 60 mL/min/1.747mFinal         Passed - Valid encounter within last 6 months    Recent Outpatient Visits          2 months ago [facility]   3 months ago Type 2 diabetes mellitus with microalbuminuria, without long-term current use of [facility]  [facility]   7 months ago Essential hypertension   [facility]   11 months ago Controlled type 2 diabetes mellitus with microalbuminuria, without long-term current use of insulin ([facility]  [facility]   1 year ago Controlled type 2 diabetes mellitus with microalbuminuria, without long-term current use of insulin ([facility]  [facility]      Future Appointments            In 1 week SoSteele SizerMD [facility]  [facility] [place]

## 2019-08-01 ENCOUNTER — Ambulatory Visit: Payer: Medicare HMO | Admitting: Family Medicine

## 2019-08-10 ENCOUNTER — Other Ambulatory Visit: Payer: Self-pay | Admitting: Family Medicine

## 2019-08-10 DIAGNOSIS — I10 Essential (primary) hypertension: Secondary | ICD-10-CM

## 2019-08-16 ENCOUNTER — Other Ambulatory Visit: Payer: Self-pay

## 2019-08-16 ENCOUNTER — Ambulatory Visit (INDEPENDENT_AMBULATORY_CARE_PROVIDER_SITE_OTHER): Payer: Medicare HMO | Admitting: Family Medicine

## 2019-08-16 ENCOUNTER — Encounter: Payer: Self-pay | Admitting: Family Medicine

## 2019-08-16 VITALS — BP 118/76 | HR 69 | Temp 97.9°F | Resp 20 | Ht 69.0 in | Wt 297.3 lb

## 2019-08-16 DIAGNOSIS — E782 Mixed hyperlipidemia: Secondary | ICD-10-CM | POA: Diagnosis not present

## 2019-08-16 DIAGNOSIS — I10 Essential (primary) hypertension: Secondary | ICD-10-CM | POA: Diagnosis not present

## 2019-08-16 DIAGNOSIS — N401 Enlarged prostate with lower urinary tract symptoms: Secondary | ICD-10-CM

## 2019-08-16 DIAGNOSIS — R351 Nocturia: Secondary | ICD-10-CM

## 2019-08-16 DIAGNOSIS — E1129 Type 2 diabetes mellitus with other diabetic kidney complication: Secondary | ICD-10-CM

## 2019-08-16 DIAGNOSIS — J410 Simple chronic bronchitis: Secondary | ICD-10-CM

## 2019-08-16 DIAGNOSIS — R809 Proteinuria, unspecified: Secondary | ICD-10-CM | POA: Diagnosis not present

## 2019-08-16 DIAGNOSIS — M109 Gout, unspecified: Secondary | ICD-10-CM | POA: Diagnosis not present

## 2019-08-16 DIAGNOSIS — E1140 Type 2 diabetes mellitus with diabetic neuropathy, unspecified: Secondary | ICD-10-CM

## 2019-08-16 DIAGNOSIS — E1159 Type 2 diabetes mellitus with other circulatory complications: Secondary | ICD-10-CM

## 2019-08-16 DIAGNOSIS — E66813 Obesity, class 3: Secondary | ICD-10-CM

## 2019-08-16 DIAGNOSIS — N521 Erectile dysfunction due to diseases classified elsewhere: Secondary | ICD-10-CM

## 2019-08-16 DIAGNOSIS — I700 Atherosclerosis of aorta: Secondary | ICD-10-CM

## 2019-08-16 DIAGNOSIS — I152 Hypertension secondary to endocrine disorders: Secondary | ICD-10-CM

## 2019-08-16 DIAGNOSIS — N529 Male erectile dysfunction, unspecified: Secondary | ICD-10-CM

## 2019-08-16 LAB — POCT GLYCOSYLATED HEMOGLOBIN (HGB A1C): Hemoglobin A1C: 7.6 % — AB (ref 4.0–5.6)

## 2019-08-16 MED ORDER — TADALAFIL 10 MG PO TABS: 10.0000 mg | ORAL_TABLET | ORAL | 0 refills | Status: DC | PRN

## 2019-08-16 MED ORDER — GLIPIZIDE ER 5 MG PO TB24: 5.0000 mg | ORAL_TABLET | Freq: Every day | ORAL | 1 refills | Status: DC

## 2019-08-16 MED ORDER — AMLODIPINE BESYLATE 10 MG PO TABS: 10.0000 mg | ORAL_TABLET | Freq: Every day | ORAL | 0 refills | Status: DC

## 2019-08-16 MED ORDER — ALLOPURINOL 300 MG PO TABS: 300.0000 mg | ORAL_TABLET | Freq: Every day | ORAL | 1 refills | Status: DC

## 2019-08-16 MED ORDER — TAMSULOSIN HCL 0.4 MG PO CAPS: 0.4000 mg | ORAL_CAPSULE | Freq: Every day | ORAL | 1 refills | Status: DC

## 2019-08-16 MED ORDER — ATORVASTATIN CALCIUM 40 MG PO TABS: 40.0000 mg | ORAL_TABLET | Freq: Every day | ORAL | 1 refills | Status: DC

## 2019-08-16 NOTE — Progress Notes (Signed)
Name: Jordan [name]   MRN: [id number]    DOB: 10-06-1940   Date:08/16/2019       Progress Note  Subjective  Chief Complaint  Chief Complaint  Patient presents with  . Follow-up    Diabetes    HPI  DM with microalbuminuria , obesity and HTN and ED: he is back on Glipizide 13m ER and still on Metformin one daily  HgbA1C used to be above 9% but he has changed his diet, walking daily for about 30 minutes , and is still  taking medication as prescribed andA1Chas been stable in the mid 7 range.Fasting glucose between 130-180, most of the time is in the high 140 's range  Viagra is working well and uses prn for ED.He cannot take ACE because of history of angioedema, bp is controlled with Norvasc.  He denies polyphagia, polydipsia but has polyuria - also has BPHHe denies hypoglycemic episodes lately   Chronic bronchitis: he used to smoke but quit many years ago, he still has a cough that is productive at times, coughs iin the mornings and has a pale yellow sputum  no wheezing or SOB.   [facility], he still has nocturia 2-3 times per night. He states it is stable. He is taking [employer], discussed changing to Flomax    HTN: he is onNorvasc and bp is towards low end of normal, he denies chest pain or orthostatic changes . He has lower extremity edema but resolves with elevation of his legs, not bothering him   Hyperlipidemia: taking lipitor and denies side effects, no myalgia.Reviewed last las and LDL was down to 77 but still not at goal, he wants to wait until next labs before switching to Crestor.   Obesity :his weight has been stable over the past year, he states continues to avoid sweets and has been walking about 30 minutes daily.   Aortic Valve stenosis:seen byRyan Dunn , PA at [facility] may 2020 , no syncope or chest pain, had echo normal EF. Unchanged  Atherosclerosis of aorta: he is on statin therapy, bp  under control, discussed aspirin daily   History of anemia: secondary to rectal bleeding, last HCT has improved, no sob, pica, we will recheck labs next visit  Gout: no recent episodes, taking allopurinol and not on diuretics   Patient Active Problem List   Diagnosis Date Noted  . Diverticulosis of colon with hemorrhage 05/26/2019  . History of anemia 05/26/2019  . Chronic kidney disease (CKD) stage G3a/A1, moderately decreased glomerular filtration rate (GFR) between 45-59 mL/min/1.73 square meter and albuminuria creatinine ratio less than 30 mg/g 05/23/2019  . Atherosclerosis of aorta ([place] 04/26/2019  . Rectal bleed 04/22/2019  . Rectal bleeding 04/20/2019  . Abnormal liver function 04/20/2019  . Chronic bronchitis ([place] 07/30/2018  . Bilateral hydrocele 11/27/2015  . Mass, scrotum 11/27/2015  . Ventral hernia without obstruction or gangrene 11/27/2015  . Controlled type 2 diabetes mellitus with microalbuminuria ([place] 10/26/2015  . 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 Hyperlipidemia 11/07/2014  . Essential hypertension 11/07/2014  . Obesity, Class III, BMI 40-49.9 (morbid obesity) ([place] 11/07/2014  . Gout 11/07/2014  . Colon cancer screening 11/07/2014  . 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  . Epistaxis 09/21/2014  . 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 Aortic valve stenosis 12/02/2012  . Constipation 10/26/2012  . Incisional hernia, without obstruction or gangrene 10/26/2012  . Left inguinal hernia  Past Surgical History:  Procedure Laterality Date  . cataract surgery    . COLONOSCOPY  2012  . COLONOSCOPY N/A 04/22/2019   Procedure: COLONOSCOPY;  Surgeon: Mansouraty, Telford Nab., MD;  Location: [facility];  Service: Gastroenterology;  Laterality: N/A;  . EYE SURGERY  2011   cataract  . UMBILICAL HERNIA REPAIR  2011    Family History  Problem Relation Age of Onset  . Heart disease Father   .  Hypertension Father   . Diabetes Sister   . Diabetes Brother   . Prostate cancer Brother   . Diabetes Sister   . Kidney disease Neg Hx   . Kidney cancer Neg Hx   . Bladder Cancer Neg Hx   . Colon cancer Neg Hx   . Esophageal cancer Neg Hx   . Inflammatory bowel disease Neg Hx   . Liver disease Neg Hx   . Pancreatic cancer Neg Hx   . Rectal cancer Neg Hx   . Stomach cancer Neg Hx     Social History   Tobacco Use  . Smoking status: Former Smoker    Packs/day: 1.00    Years: 10.00    Pack years: 10.00    Types: Cigarettes    Start date: 03/17/1965    Quit date: 11/27/1975    Years since quitting: 43.7  . Smokeless tobacco: Never Used  . Tobacco comment: quit 40 years  Substance Use Topics  . Alcohol use: Yes    Alcohol/week: 1.0 standard drinks    Types: 1 Standard drinks or equivalent per week    Comment: socially - 1 x year     Current Outpatient Medications:  .  ACCU-CHEK FASTCLIX LANCETS MISC, 1 each by Subdermal route 2 (two) times daily., Disp: 102 each, Rfl: 2 .  allopurinol (ZYLOPRIM) 300 MG tablet, Take 1 tablet (300 mg total) by mouth daily., Disp: 90 tablet, Rfl: 1 .  amLODipine (NORVASC) 10 MG tablet, TAKE 1 TABLET EVERY DAY, Disp: 90 tablet, Rfl: 0 .  aspirin 81 MG tablet, Take 2 tablets (162 mg total) by mouth at bedtime., Disp: 30 tablet, Rfl:  .  atorvastatin (LIPITOR) 40 MG tablet, Take 1 tablet (40 mg total) by mouth daily., Disp: 90 tablet, Rfl: 1 .  Blood Glucose Monitoring Suppl (ACCU-CHEK AVIVA PLUS) w/Device KIT, 1 each by Does not apply route 2 (two) times daily. Use as directed, Disp: 100 kit, Rfl: 12 .  Ferrous Gluconate 324 (37.5 Fe) MG TABS, Take 1 tablet (324 mg total) by mouth daily., Disp: 30 tablet, Rfl: 3 .  glipiZIDE (GLUCOTROL XL) 5 MG 24 hr tablet, Take 1 tablet (5 mg total) by mouth daily with breakfast., Disp: 90 tablet, Rfl: 1 .  glucose blood (ACCU-CHEK AVIVA PLUS) test strip, 1 each by Other route 2 (two) times daily as needed for  other. Use as instructed, Disp: 200 strip, Rfl: 2 .  latanoprost (XALATAN) 0.005 % ophthalmic solution, Place 1 drop into the left eye at bedtime. , Disp: , Rfl:  .  metFORMIN (GLUCOPHAGE-XR) 500 MG 24 hr tablet, TAKE 2 TABLETS  DAILY WITH BREAKFAST., Disp: 180 tablet, Rfl: 1 .  niacin 100 MG tablet, Take 100 mg by mouth at bedtime., Disp: , Rfl:  .  Omega-3 Fatty Acids (FISH OIL PO), Take 1 capsule by mouth 2 (two) times daily. , Disp: , Rfl:  .  polyethylene glycol (MIRALAX / GLYCOLAX) packet, Take 17 g by mouth at bedtime. , Disp: , Rfl:  .  sildenafil (VIAGRA) 100 MG tablet, Take 0.5-1 tablets (50-100 mg total) by mouth daily as needed for erectile dysfunction., Disp: 30 tablet, Rfl: 2  Allergies  Allergen Reactions  . Ace Inhibitors Other (See Comments)    angioedema    I personally reviewed active problem list, medication list, allergies, family history, social history, health maintenance with the patient/caregiver today.   ROS  Constitutional: Negative for fever or weight change.  Respiratory: Positive  for cough but no  shortness of breath.   Cardiovascular: Negative for chest pain or palpitations.  Gastrointestinal: Negative for abdominal pain, no bowel changes.  Musculoskeletal: positive for gait disturbance secondary to knee pain but no joint swelling.  Skin: Negative for rash.  Neurological: Negative for dizziness or headache.  No other specific complaints in a complete review of systems (except as listed in HPI above).  Objective  Vitals:   08/16/19 1139  BP: 118/76  Pulse: 69  Resp: 20  Temp: 97.9 F (36.6 C)  TempSrc: Temporal  SpO2: 97%  Weight: 297 lb 4.8 oz (134.9 kg)  Height: '5\' 9"'  (1.753 m)    Body mass index is 43.9 kg/m.  Physical Exam  Constitutional: Patient appears well-developed and well-nourished. Obese No distress.  HEENT: head atraumatic, normocephalic, pupils equal and reactive to light, neck supple, oral mucosa not done Cardiovascular:  Normal rate, regular rhythm and normal heart sounds.  No murmur heard. 2 plus  BLE edema. Pulmonary/Chest: Effort normal , rhonchi bilaterally. No respiratory distress. Abdominal: Soft.  There is no tenderness. Psychiatric: Patient has a normal mood and affect. behavior is normal. Judgment and thought content normal.  Recent Results (from the past 2160 hour(s))  Potassium     Status: None   Collection Time: 05/20/19 11:36 AM  Result Value Ref Range   Potassium 4.8 3.5 - 5.3 mmol/L  POCT HgB A1C     Status: Abnormal   Collection Time: 08/16/19 11:58 AM  Result Value Ref Range   Hemoglobin A1C 7.6 (A) 4.0 - 5.6 %   HbA1c POC (<> result, manual entry)     HbA1c, POC (prediabetic range)     HbA1c, POC (controlled diabetic range)       PHQ2/9: Depression screen [facility] 2/9 08/16/2019 04/01/2019 11/30/2018 07/30/2018 11/25/2017  Decreased Interest 0 0 3 0 0  Down, Depressed, Hopeless 0 0 0 0 0  PHQ - 2 Score 0 0 3 0 0  Altered sleeping 0 0 1 0 -  Tired, decreased energy 0 0 1 0 -  Change in appetite 0 0 2 0 -  Feeling bad or failure about yourself  0 0 2 0 -  Trouble concentrating 0 0 1 0 -  Moving slowly or fidgety/restless 0 0 0 0 -  Suicidal thoughts 0 0 0 0 -  PHQ-9 Score 0 0 10 0 -  Difficult doing work/chores - - Not difficult at all - -    phq 9 is negative   Fall Risk: Fall Risk  08/16/2019 04/01/2019 11/30/2018 07/30/2018 03/29/2018  Falls in the past year? 0 0 0 0 0  Number falls in past yr: - 0 0 0 0  Injury with Fall? - 0 0 0 0  Risk for fall due to : - - - - -  Follow up - - - - -     Functional Status Survey: Is the patient deaf or have difficulty hearing?: No Does the patient have difficulty seeing, even when wearing glasses/contacts?: No Does the patient  have difficulty concentrating, remembering, or making decisions?: No Does the patient have difficulty walking or climbing stairs?: No Does the patient have difficulty dressing or bathing?: No Does the patient have  difficulty doing errands alone such as visiting a doctor's office or shopping?: No   Assessment & Plan  1. Type 2 diabetes mellitus with microalbuminuria, without long-term current use of insulin (HCC)  - POCT HgB A1C  2. 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  Dispense: 90 tablet; Refill: 1  3. Mixed hyperlipidemia  - atorvastatin (LIPITOR) 40 MG tablet; Take 1 tablet (40 mg total) by mouth daily.  Dispense: 90 tablet; Refill: 1  4. Essential hypertension  - amLODipine (NORVASC) 10 MG tablet; Take 1 tablet (10 mg total) by mouth daily.  Dispense: 90 tablet; Refill: 0  5. Controlled gout  - allopurinol (ZYLOPRIM) 300 MG tablet; Take 1 tablet (300 mg total) by mouth daily.  Dispense: 90 tablet; Refill: 1  6. Simple chronic bronchitis ([place])  Does not want medication   7. Obesity, Class III, BMI 40-49.9 (morbid obesity) ([place])  Discussed with the patient the risk posed by an increased BMI. Discussed importance of portion control, calorie counting and at least 150 minutes of physical activity weekly. Avoid sweet beverages and drink more water. Eat at least 6 servings of fruit and vegetables daily   8. Atherosclerosis of aorta ([place])  On statin therapy   9. Benign prostatic hyperplasia with nocturia  - tamsulosin (FLOMAX) 0.4 MG CAPS capsule; Take 1 capsule (0.4 mg total) by mouth daily.  Dispense: 90 capsule; Refill: 1  10. ED (erectile dysfunction) of organic origin  - tadalafil (CIALIS) 10 MG tablet; Take 1-2 tablets (10-20 mg total) by mouth every three (3) days as needed for erectile dysfunction.  Dispense: 30 tablet; Refill: 0  11. Hypertension associated with diabetes ([place])  Controlled

## 2019-08-18 ENCOUNTER — Other Ambulatory Visit: Payer: Self-pay

## 2019-08-18 ENCOUNTER — Ambulatory Visit: Payer: Medicare HMO | Admitting: Podiatry

## 2019-08-18 ENCOUNTER — Encounter: Payer: Self-pay | Admitting: Podiatry

## 2019-08-18 DIAGNOSIS — M79676 Pain in unspecified toe(s): Secondary | ICD-10-CM | POA: Diagnosis not present

## 2019-08-18 DIAGNOSIS — B351 Tinea unguium: Secondary | ICD-10-CM | POA: Diagnosis not present

## 2019-08-18 DIAGNOSIS — E1142 Type 2 diabetes mellitus with diabetic polyneuropathy: Secondary | ICD-10-CM

## 2019-08-18 DIAGNOSIS — L84 Corns and callosities: Secondary | ICD-10-CM

## 2019-08-18 DIAGNOSIS — N1831 Chronic kidney disease, stage 3a: Secondary | ICD-10-CM | POA: Diagnosis not present

## 2019-08-18 NOTE — Progress Notes (Signed)
Complaint:  Visit Type: Patient returns to my office for risk foot care.  This patient requires this care by a professional since this patient will be at a high risk due to having diabetes.  This patient is unable to cut his own nails since he cannot reach his nails.  This patient presents for at risk foot care today.  He also states that he has a painful corn on the second toe right foot.  Podiatric Exam: Vascular: dorsalis pedis and posterior tibial pulses are palpable bilateral. Capillary return is immediate. Temperature gradient is WNL. Skin turgor WNL  Sensorium: Normal Semmes Weinstein monofilament test. Normal tactile sensation bilaterally. Nail Exam: Pt has thick disfigured discolored nails with subungual debris noted bilateral entire nail hallux through fifth toenails.  Nails are thick painful and deformed 1-5  B/L. Ulcer Exam: There is no evidence of ulcer or pre-ulcerative changes or infection. Orthopedic Exam: Muscle tone and strength are WNL. No limitations in general ROM. No crepitus or effusions noted. HAV  B/L. Hammer toes second  B/L Skin: No Porokeratosis. No infection or ulcers.  Corn second toe medial aspect @ PIPJ.   Diagnosis:  Onychomycosis, , Pain in right toe, pain in left toes, Corn/callus  Treatment & Plan Procedures and Treatment: Consent by patient was obtained for treatment procedures. The patient understood the discussion of treatment and procedures well. All questions were answered thoroughly reviewed. Debridement of mycotic and hypertrophic toenails, 1 through 5 bilateral and clearing of subungual debris. No ulceration, no infection noted.   Debride corn second toe right foot.  Told this patient to return for periodic foot evaluation to help reduce potential of at risk complications. Return Visit-Office Procedure: Patient instructed to return to the office for a follow up visit 3 months for continued evaluation and treatment.    Gardiner Barefoot DPM

## 2019-08-22 DIAGNOSIS — H401122 Primary open-angle glaucoma, left eye, moderate stage: Secondary | ICD-10-CM | POA: Diagnosis not present

## 2019-08-25 ENCOUNTER — Ambulatory Visit: Payer: Medicare HMO | Admitting: Podiatry

## 2019-08-31 ENCOUNTER — Telehealth: Payer: Self-pay | Admitting: Family Medicine

## 2019-08-31 NOTE — Chronic Care Management (AMB) (Signed)
  Chronic Care Management   Outreach Note  08/31/2019 Name: Jordan [name] MRN: [id number] DOB: 04/08/1940  Jordan [name] is a 79 y.o. year old male who is a primary care patient of Steele Sizer, MD. I reached out to [name] by phone today in response to a referral sent by [name].     An unsuccessful telephone outreach was attempted today. The patient was referred to the case management team for assistance with care management and care coordination.   Follow Up Plan: A HIPPA compliant phone message was left for the patient providing contact information and requesting a return call.  The care management team will reach out to the patient again over the next 7 days.  If patient returns call to provider office, please advise to call [facility] at [place], [place], [place], [place] [address] Direct Dial: [phone] [email or web address]: [email or web address]

## 2019-09-05 NOTE — Chronic Care Management (AMB) (Signed)
  Chronic Care Management   Outreach Note  09/05/2019 Name: Jordan [name] MRN: [id number] DOB: 1940-09-28  Jordan [name] is a 79 y.o. year old male who is a primary care patient of Steele Sizer, MD. I reached out to Jordan [name] by phone today in response to a referral sent by Jordan [name] [facility] health plan.     A second unsuccessful telephone outreach was attempted today. The patient was referred to the case management team for assistance with care management and care coordination.   Follow Up Plan: A HIPPA compliant phone message was left for the patient providing contact information and requesting a return call.  The care management team will reach out to the patient again over the next 7 days.  If patient returns call to provider office, please advise to call [facility] at [place], [place], [place], [place] [address] Direct Dial: [phone] [email or web address]: [email or web address]

## 2019-09-06 NOTE — Chronic Care Management (AMB) (Signed)
  Chronic Care Management   Note  09/06/2019 Name: KEATS KINGRY MRN: [id number] DOB: 1940/09/23  JAYDEE CONRAN is a 79 y.o. year old male who is a primary care patient of Steele Sizer, MD. I reached out to [name] by phone today in response to a referral sent by Mr. Koa [facility]eller [facility] health plan.     Mr. Steiner was given information about Chronic Care Management services today including:  1. CCM service includes personalized support from designated clinical staff supervised by his physician, including individualized plan of care and coordination with other care providers 2. 24/7 contact phone numbers for assistance for urgent and routine care needs. 3. Service will only be billed when office clinical staff spend 20 minutes or more in a month to coordinate care. 4. Only one practitioner may furnish and bill the service in a calendar month. 5. The patient may stop CCM services at any time (effective at the end of the month) by phone call to the office staff. 6. The patient will be responsible for cost sharing (co-pay) of up to 20% of the service fee (after annual deductible is met).  Patient agreed to services and verbal consent obtained.   Follow up plan: Telephone appointment with care management team member scheduled for:10/07/2019  Noreene Larsson, [place], [place], [place] [address] Direct Dial: [phone] [email or web address] Website: [email or web address]

## 2019-09-23 ENCOUNTER — Ambulatory Visit: Payer: Medicare HMO | Admitting: Family

## 2019-09-23 ENCOUNTER — Other Ambulatory Visit: Payer: Self-pay

## 2019-09-23 ENCOUNTER — Encounter: Payer: Self-pay | Admitting: Family

## 2019-09-23 VITALS — BP 126/80 | HR 93 | Ht 69.0 in | Wt 297.5 lb

## 2019-09-23 DIAGNOSIS — I350 Nonrheumatic aortic (valve) stenosis: Secondary | ICD-10-CM | POA: Diagnosis not present

## 2019-09-23 DIAGNOSIS — E785 Hyperlipidemia, unspecified: Secondary | ICD-10-CM | POA: Diagnosis not present

## 2019-09-23 DIAGNOSIS — I10 Essential (primary) hypertension: Secondary | ICD-10-CM | POA: Diagnosis not present

## 2019-09-23 DIAGNOSIS — R911 Solitary pulmonary nodule: Secondary | ICD-10-CM

## 2019-09-23 DIAGNOSIS — R9431 Abnormal electrocardiogram [ECG] [EKG]: Secondary | ICD-10-CM | POA: Diagnosis not present

## 2019-09-23 DIAGNOSIS — E782 Mixed hyperlipidemia: Secondary | ICD-10-CM

## 2019-09-23 MED ORDER — ASPIRIN EC 81 MG PO TBEC: 81.0000 mg | DELAYED_RELEASE_TABLET | Freq: Every day | ORAL | Status: AC

## 2019-09-23 NOTE — Patient Instructions (Addendum)
Medication Instructions:  Your physician has recommended you make the following change in your medication:   1) CHANGE Aspirin to 81mg  daily  *If you need a refill on your cardiac medications before your next appointment, please call your pharmacy*   Lab Work: Your physician recommends lab work today: BMET, magnesium  If you have labs (blood work) drawn today and your tests are completely normal, you will receive your results only by: Marland Kitchen MyChart Message (if you have MyChart) OR . A paper copy in the mail If you have any lab test that is abnormal or we need to change your treatment, we will call you to review the results.   Testing/Procedures:  1) Echocardiogram- Your physician has requested that you have an echocardiogram. Echocardiography is a painless test that uses sound waves to create images of your heart. It provides your doctor with information about the size and shape of your heart and how well your heart's chambers and valves are working. This procedure takes approximately one hour. There are no restrictions for this procedure.This will let us check on your moderately leaky aortic valve.   2) CT chest (non-contrasted) - You may call [phone] to schedule - to follow up on pulmonary nodules - Non-Cardiac CT scanning, (CAT scanning), is a noninvasive, special x-ray that produces cross-sectional images of the body using x-rays and a computer. CT scans help physicians diagnose and treat medical conditions. For some CT exams, a contrast material is used to enhance visibility in the area of the body being studied. CT scans provide greater clarity and reveal more details than regular x-ray exams.   3) EKG- Your EKG today showed a mildly prolonged QT (this is a slightly longer interval than normal) the lab work today will make sure there is not an electrolyte abnormality causing this.   Follow-Up: At [facility], you and your health needs are our priority.  As part of our  continuing mission to provide you with exceptional heart care, we have created designated Provider Care Teams.  These Care Teams include your primary Cardiologist (physician) and Advanced Practice Providers (APPs -  Physician Assistants and Nurse Practitioners) who all work together to provide you with the care you need, when you need it.  We recommend signing up for the patient portal called "MyChart".  Sign up information is provided on this After Visit Summary.  MyChart is used to connect with patients for Virtual Visits (Telemedicine).  Patients are able to view lab/test results, encounter notes, upcoming appointments, etc.  Non-urgent messages can be sent to your provider as well.   To learn more about what you can do with MyChart, go to [email or web address].    Your next appointment:   4 month(s)  The format for your next appointment:   In Person  Provider:    You may see Ida Rogue, MD or one of the following Advanced Practice Providers on your designated Care Team:    Murray Hodgkins, NP  Christell Faith, PA-C  Marrianne Mood, PA-C  Laurann Montana, NP  Other Instructions  Keep up the good work with your walking!    Echocardiogram An echocardiogram is a procedure that uses painless sound waves (ultrasound) to produce an image of the heart. Images from an echocardiogram can provide important information about:  Signs of coronary artery disease (CAD).  Aneurysm detection. An aneurysm is a weak or damaged part of an artery wall that bulges out from the normal force of blood pumping through the body.  Heart size and shape. Changes in the size or shape of the heart can be associated with certain conditions, including heart failure, aneurysm, and CAD.  Heart muscle function.  Heart valve function.  Signs of a past heart attack.  Fluid buildup around the heart.  Thickening of the heart muscle.  A tumor or infectious growth around the heart valves. Tell a health  care provider about:  Any allergies you have.  All medicines you are taking, including vitamins, herbs, eye drops, creams, and over-the-counter medicines.  Any blood disorders you have.  Any surgeries you have had.  Any medical conditions you have.  Whether you are pregnant or may be pregnant. What are the risks? Generally, this is a safe procedure. However, problems may occur, including:  Allergic reaction to dye (contrast) that may be used during the procedure. What happens before the procedure? No specific preparation is needed. You may eat and drink normally. What happens during the procedure?   An IV tube may be inserted into one of your veins.  You may receive contrast through this tube. A contrast is an injection that improves the quality of the pictures from your heart.  A gel will be applied to your chest.  A wand-like tool (transducer) will be moved over your chest. The gel will help to transmit the sound waves from the transducer.  The sound waves will harmlessly bounce off of your heart to allow the heart images to be captured in real-time motion. The images will be recorded on a computer. The procedure may vary among health care providers and hospitals. What happens after the procedure?  You may return to your normal, everyday life, including diet, activities, and medicines, unless your health care provider tells you not to do that. Summary  An echocardiogram is a procedure that uses painless sound waves (ultrasound) to produce an image of the heart.  Images from an echocardiogram can provide important information about the size and shape of your heart, heart muscle function, heart valve function, and fluid buildup around your heart.  You do not need to do anything to prepare before this procedure. You may eat and drink normally.  After the echocardiogram is completed, you may return to your normal, everyday life, unless your health care provider tells you not  to do that. This information is not intended to replace advice given to you by your health care provider. Make sure you discuss any questions you have with your health care provider. Document Revised: 06/24/2018 Document Reviewed: 04/05/2016 Elsevier Patient Education  [place].

## 2019-09-23 NOTE — Progress Notes (Signed)
Office Visit    Patient Name: Jordan [name] Date of Encounter: 09/23/2019  Primary Care Provider:  Steele Sizer, MD Primary Cardiologist:  Ida Rogue, MD Electrophysiologist:  None   Chief Complaint    Jordan [name] is a 79 y.o. male with a hx of aortic valve stenosis, DM2, HTN, HLD, ACE/ARB induced angioedema, morbid obesity, prior tobacco use presents today for follow-up of aortic valve disease  Past Medical History    Past Medical History:  Diagnosis Date   Aortic stenosis    Chronic kidney disease, stage II (mild)    Decreased libido    Diabetes mellitus without complication ([place])    Glaucoma    Gout 2009   Hernia [id number],2229   Hyperlipidemia    Hypertension    Inguinal hernia without mention of obstruction or gangrene, unilateral or unspecified, (not specified as recurrent)    Lumbago    Obesity    Unspecified constipation    Past Surgical History:  Procedure Laterality Date   cataract surgery     COLONOSCOPY  2012   COLONOSCOPY N/A 04/22/2019   Procedure: COLONOSCOPY;  Surgeon: Irving Copas., MD;  Location: [facility];  Service: Gastroenterology;  Laterality: N/A;   EYE SURGERY  2011   cataract   UMBILICAL HERNIA REPAIR  2011    Allergies  Allergies  Allergen Reactions   Ace Inhibitors Other (See Comments)    angioedema    History of Present Illness    Jordan [name] is a 79 y.o. male with a hx of aortic valve stenosis, DM2, HTN, HLD, ACE/ARB induced angioedema, morbid obesity, prior tobacco use last seen 08/11/2018 by Christell Faith, PA.  Prior echo in 2014 with normal LVEF with mild aortic stenosis.  Seen December 2018 for routine follow-up and he was asymptomatic.  Follow-up echo January 2019 with LVEF 55 to 60%, no RWMA, grade 1 diastolic dysfunction, mild aortic stenosis with mean gradient of 21 mmHg and valve area of 1.47, mildly dilated LA, RV SF normal, PASP normal.  Seen in clinic May 2020 doing well from  cardiac perspective, however blood pressure uncontrolled and has amlodipine was increased to 5 mg daily.  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  Given progression he was recommended for CTA aorta.  CT 09/2018 demonstrated normal in caliber aortic root and slight dilation of descending thoracic aorta without aneurysm.  Noted calcifications along thoracic aorta, LAD, LCx, RCA.  Incidental finding of 3 mm peripheral RLL pulmonary nodule recommended for repeat CT in 1 year.  Incidental finding of renal cyst not recommended for further imaging.  He was admitted 04/20/2019 for lower GI bleed.  Colonoscopy performed notable for diverticulosis.  He was given IV Feraheme for iron deficiency.   Reports feeling well.  Endorses that he does try to follow a low-sodium diet but does not always watch his carbohydrates.  He exercises by walking at the mall.  Reports no lightheadedness, dizziness, near syncope.  Reports no chest pain, pressure, tightness.  Reports no shortness of breath at rest.  Tells me his dyspnea on exertion is stable at baseline he attributes this to his age.  No cough, wheeze.  Tells me his ankles only swell if he sits too long  and this resolves if he been elevated.  We reviewed symptoms of aortic stenosis to report.  We reviewed need for echocardiogram which he received is agreeable to.  EKGs/Labs/Other Studies Reviewed:   The following studies were reviewed today:  Echo 08/2018  1. The left ventricle has normal systolic function with an ejection  fraction of 60-65%. The cavity size was normal. There is moderately  increased left ventricular wall thickness. Left ventricular diastolic  Doppler parameters are consistent with impaired    relaxation.   2. The right ventricle has normal systolic function. The cavity was  normal. There is no increase in right ventricular wall thickness.   3. Left atrial size was mildly dilated.   4. The aortic valve was not well visualized. Severe calcifcation of the  aortic valve. Moderate stenosis of the aortic valve. Mean gradient 26 mm  Hg, peak gradient 45 mm Hg, Peak velocity 3.34 m/sec   5. There is dilatation of the aortic root.   EKG:  EKG is ordered today.  The ekg ordered today demonstrates sinus rhythm 93 bpm with RBBB and prolonged QT (370, QTc 460).  Recent Labs: 05/03/2019: ALT 12; BUN 16; Creat 0.95; Hemoglobin 11.7; Platelets 250; Sodium 136 05/20/2019: Potassium 4.8  Recent Lipid Panel    Component Value Date/Time   CHOL 127 11/30/2018 0000   CHOL 131 08/27/2015 1158   TRIG 58 11/30/2018 0000   HDL 46 11/30/2018 0000   HDL 40 08/27/2015 1158   CHOLHDL 2.8 11/30/2018 0000   VLDL 20 07/28/2016 1248   LDLCALC 68 11/30/2018 0000    Home Medications   Current Meds  Medication Sig   ACCU-CHEK FASTCLIX LANCETS MISC 1 each by Subdermal route 2 (two) times daily.   allopurinol (ZYLOPRIM) 300 MG tablet Take 1 tablet (300 mg total) by mouth daily.   amLODipine (NORVASC) 10 MG tablet Take 1 tablet (10 mg total) by mouth daily.   atorvastatin (LIPITOR) 40 MG tablet Take 1 tablet (40 mg total) by mouth daily.   Blood Glucose Monitoring Suppl (ACCU-CHEK AVIVA PLUS) w/Device KIT 1 each by Does not apply route 2 (two) times daily. Use as directed   Ferrous Gluconate 324 (37.5 Fe) MG TABS Take 1 tablet (324 mg total) by mouth daily.   glipiZIDE (GLUCOTROL XL) 5 MG 24 hr tablet Take 1 tablet (5 mg total) by mouth daily with breakfast.   glucose blood (ACCU-CHEK AVIVA PLUS) test strip 1 each by Other route 2 (two) times daily as needed for other. Use as instructed   latanoprost (XALATAN) 0.005 % ophthalmic solution Place 1 drop into the left eye at bedtime.    metFORMIN  (GLUCOPHAGE-XR) 500 MG 24 hr tablet TAKE 2 TABLETS  DAILY WITH BREAKFAST.   niacin 100 MG tablet Take 100 mg by mouth at bedtime.   Omega-3 Fatty Acids (FISH OIL PO) Take 1 capsule by mouth 2 (two) times daily.    polyethylene glycol (MIRALAX / GLYCOLAX) packet Take 17 g by mouth at bedtime.    tadalafil (CIALIS) 10 MG tablet Take 1-2 tablets (10-20 mg total) by mouth every three (3) days as needed for erectile dysfunction.   tamsulosin (FLOMAX) 0.4 MG CAPS capsule Take 1 capsule (0.4 mg total) by mouth daily.   [DISCONTINUED] aspirin 81 MG tablet Take 2 tablets (162 mg total) by mouth at bedtime.     Review of Systems     Review of Systems  Constitutional: Negative for chills, fever and malaise/fatigue.  Cardiovascular: Positive  for dyspnea on exertion. Negative for chest pain, leg swelling, near-syncope, orthopnea, palpitations and syncope.  Respiratory: Negative for cough, shortness of breath and wheezing.   Gastrointestinal: Negative for nausea and vomiting.  Neurological: Negative for dizziness, light-headedness and weakness.   All other systems reviewed and are otherwise negative except as noted above.  Physical Exam    VS:  BP 126/80 (BP Location: Left Arm, Patient Position: Sitting, Cuff Size: Large)    Pulse 93    Ht '5\' 9"'  (1.753 m)    Wt 297 lb 8 oz (134.9 kg)    SpO2 97%    BMI 43.93 kg/m  , BMI Body mass index is 43.93 kg/m. GEN: Well nourished, overweight, well developed, in no acute distress. HEENT: normal. Neck: Supple, no JVD, carotid bruits, or masses. Cardiac: RRR, no rubs, or gallops.  Grade 2/6 systolic ejection murmur.  No clubbing, cyanosis, edema.  Radials/DP/PT 2+ and equal bilaterally.  Respiratory:  Respirations regular and unlabored, clear to auscultation bilaterally. GI: Soft, nontender, nondistended, BS + x 4. MS: No deformity or atrophy. Skin: Warm and dry, no rash. Neuro:  Strength and sensation are intact. Psych: Normal affect.   Assessment &  Plan    1. Aortic stenosis -moderate by echo 08/2018 plan for repeat echo.  Reports no chest pain, lightheadedness, near syncope.  Educated to report the symptoms.  Optimal blood pressure control recommended.  2. Prolonged QT -EKG today with QT 370 QTC 460.  No noted QT prolonging medications.  Plan for BMP and magnesium.  3. HTN - BP well controlled. Continue current antihypertensive regimen.   4. HLD, LDL goal less than 70 - 11/2018 LDL 68.  At goal of less than 70.  Continue atorvastatin 40 mg daily.  5. DM2 - 08/16/19 A1c 7.6.  Continue to follow with PCP.  6. Obesity -weight loss by diet and exercise encouraged.  7. 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  Continue statin.  8. Pulmonary nodule -CT since 09/2018 with a tiny 3 mm RLL pulmonary nodule. Due to history of tobacco use, noncontrast CT ordered for monitoring.   Disposition: Follow up in 4 month(s) with Dr. Rockey Situ or APP  Loel Dubonnet, NP 09/23/2019, 2:06 PM

## 2019-09-24 LAB — BASIC METABOLIC PANEL
BUN/Creatinine Ratio: 15 (ref 10–24)
BUN: 14 mg/dL (ref 8–27)
CO2: 24 mmol/L (ref 20–29)
Calcium: 9.9 mg/dL (ref 8.6–10.2)
Chloride: 102 mmol/L (ref 96–106)
Creatinine, Ser: 0.96 mg/dL (ref 0.76–1.27)
GFR calc Af Amer: 87 mL/min/{1.73_m2} (ref >59)
GFR calc non Af Amer: 75 mL/min/{1.73_m2} (ref >59)
Glucose: 184 mg/dL — ABNORMAL HIGH (ref 65–99)
Potassium: 4.9 mmol/L (ref 3.5–5.2)
Sodium: 138 mmol/L (ref 134–144)

## 2019-09-24 LAB — MAGNESIUM: Magnesium: 1.7 mg/dL (ref 1.6–2.3)

## 2019-10-07 ENCOUNTER — Ambulatory Visit: Payer: Self-pay

## 2019-10-07 ENCOUNTER — Telehealth: Payer: Medicare HMO

## 2019-10-07 NOTE — Chronic Care Management (AMB) (Signed)
  Chronic Care Management   Outreach Note  10/07/2019 Name: GUTHRIE LEMME MRN: [id number] DOB: 1940/05/10  Primary Care Provider: Steele Sizer, MD Reason for referral : Chronic Care Management   An unsuccessful telephone outreach was attempted today. Mr. Canion was referred to the case management team for assistance with care management and care coordination. The initial outreach was attempted today.    Follow Up Plan:  The care management team will reach out to Mr. Gwyndolyn Saxon again within the next two weeks.   [facility] [phone]

## 2019-10-21 ENCOUNTER — Ambulatory Visit (INDEPENDENT_AMBULATORY_CARE_PROVIDER_SITE_OTHER): Payer: Medicare HMO

## 2019-10-21 DIAGNOSIS — I10 Essential (primary) hypertension: Secondary | ICD-10-CM

## 2019-10-21 DIAGNOSIS — E782 Mixed hyperlipidemia: Secondary | ICD-10-CM

## 2019-10-21 DIAGNOSIS — R809 Proteinuria, unspecified: Secondary | ICD-10-CM | POA: Diagnosis not present

## 2019-10-21 DIAGNOSIS — E1129 Type 2 diabetes mellitus with other diabetic kidney complication: Secondary | ICD-10-CM

## 2019-10-21 NOTE — Chronic Care Management (AMB) (Signed)
Chronic Care Management   Initial Visit Note  10/21/2019 Name: Jordan [name] MRN: [id number] DOB: 08-13-1940  Primary Care Provider: Steele Sizer, MD [name] for referral : Chronic Care Management   Jordan [name] is a 79 y.o. year old male who is a primary care patient of Steele Sizer, MD. The CCM team was consulted for assistance with chronic disease management and care coordination. A telephonic outreach was conducted today.  Review of Jordan [name] status, including review of consultants reports, relevant labs and test results was conducted today. Collaboration with appropriate care team members was performed as part of the comprehensive evaluation and provision of chronic care management services.    SDOH (Social Determinants of Health) assessments performed: Yes See Care Plan activities for detailed interventions related to SDOH  SDOH Interventions     Most Recent Value  SDOH Interventions  Food Insecurity Interventions Intervention Not Indicated  Transportation Interventions Intervention Not Indicated       Medications: Outpatient Encounter Medications as of 10/21/2019  Medication Sig  . ACCU-CHEK FASTCLIX LANCETS MISC 1 each by Subdermal route 2 (two) times daily.  Marland Kitchen allopurinol (ZYLOPRIM) 300 MG tablet Take 1 tablet (300 mg total) by mouth daily.  Marland Kitchen amLODipine (NORVASC) 10 MG tablet Take 1 tablet (10 mg total) by mouth daily.  Marland Kitchen aspirin EC 81 MG tablet Take 1 tablet (81 mg total) by mouth daily. Swallow whole.  Marland Kitchen atorvastatin (LIPITOR) 40 MG tablet Take 1 tablet (40 mg total) by mouth daily.  . Blood Glucose Monitoring Suppl (ACCU-CHEK AVIVA PLUS) w/Device KIT 1 each by Does not apply route 2 (two) times daily. Use as directed  . glipiZIDE (GLUCOTROL XL) 5 MG 24 hr tablet Take 1 tablet (5 mg total) by mouth daily with breakfast.  . glucose blood (ACCU-CHEK AVIVA PLUS) test strip 1 each by Other route 2 (two) times daily as needed for other. Use as instructed  .  latanoprost (XALATAN) 0.005 % ophthalmic solution Place 1 drop into the left eye at bedtime.   . metFORMIN (GLUCOPHAGE-XR) 500 MG 24 hr tablet TAKE 2 TABLETS  DAILY WITH BREAKFAST.  Marland Kitchen niacin 100 MG tablet Take 100 mg by mouth at bedtime.  . Omega-3 Fatty Acids (FISH OIL PO) Take 1 capsule by mouth 2 (two) times daily.   . polyethylene glycol (MIRALAX / GLYCOLAX) packet Take 17 g by mouth at bedtime.   . tamsulosin (FLOMAX) 0.4 MG CAPS capsule Take 1 capsule (0.4 mg total) by mouth daily.  . Ferrous Gluconate 324 (37.5 Fe) MG TABS Take 1 tablet (324 mg total) by mouth daily. (Patient not taking: Reported on 10/21/2019)  . tadalafil (CIALIS) 10 MG tablet Take 1-2 tablets (10-20 mg total) by mouth every three (3) days as needed for erectile dysfunction.   No facility-administered encounter medications on file as of 10/21/2019.     Objective:  BP Readings from Last 3 Encounters:  09/23/19 126/80  08/16/19 118/76  05/24/19 124/72   Lab Results  Component Value Date   CHOL 127 11/30/2018   HDL 46 11/30/2018   LDLCALC 68 11/30/2018   TRIG 58 11/30/2018   CHOLHDL 2.8 11/30/2018   Lab Results  Component Value Date   HGBA1C 7.6 (A) 08/16/2019   Functional Status Survey: Is the patient deaf or have difficulty hearing?: No Does the patient have difficulty seeing, even when wearing glasses/contacts?: No Does the patient have difficulty concentrating, remembering, or making decisions?: No Does the patient have difficulty walking or climbing stairs?:  No Does the patient have difficulty dressing or bathing?: No Does the patient have difficulty doing errands alone such as visiting a doctor's office or shopping?: No     Goals Addressed            This Visit's Progress   . Chronic Disease Management       CARE PLAN ENTRY (see longitudinal plan of care for additional care plan information)  Current Barriers:  . Chronic Disease Management support and education needs related to Hypertension,  Diabetes, Chronic Bronchitis, HLD and CKD.   Case Manager Clinical Goal(s):  Over the next 120 days, patient will: . Attend all medical appointments as scheduled. . Take all medications as prescribed.  . Monitor blood pressure and record readings. . Consistently monitor fasting blood glucose and maintain a log. . Follow safety measures to prevent falls and injuries.  Interventions:  . Inter-disciplinary care team collaboration (see longitudinal plan of care) . Reviewed medications. Advised to take medications as prescribed. Advised to notify provider if unable to tolerate prescribed regimen and avoid abruptly discontinuing medications. Encouraged to notify care management team with concerns regarding medication management or prescription costs. . Discussed established blood pressure parameters and indications for notifying provider. Encouraged to monitor BP routinely if unable to monitor daily and record readings. . Reviewed fasting blood glucose readings. Reviewed s/sx of hypoglycemia and hyperglycemia along with recommended interventions. Encouraged to consistently monitor blood glucose levels and maintain a log. Reports not monitoring his levels today. Reports most fasting readings have ranged in the 140's. . Discussed current activity level. Provided information regarding safety and fall prevention measures. Encouraged to use cane or assistive device as needed when ambulating. Reports ambulating well. Attempts to walk 30 min/day five days a week. Denies complaints of shortness of breath or chest discomfort during prolonged walks. Denies recent falls.  . Reviewed pending appointments. Encouraged to attend scheduled medical appointments to prevent delays in care. Encouraged to notify care management team with concerns regarding transportation.  . Discussed plans for ongoing care management and follow up. Provided direct contact information. Reports doing well and independently performing care.  States he is usually out of the area visiting/assisting his sister approximately 2 weeks a month. Denies immediate care management needs. The care management team will plan to follow-up next month.   Patient Self Care Activities:  . Self administers medications . Attends scheduled provider appointments . Calls pharmacy for medication refills . Performs ADL's independently . Performs IADL's independently   Initial goal documentation        Jordan [name] was given information about Chronic Care Management services including:  1. CCM service includes personalized support from designated clinical staff supervised by his physician, including individualized plan of care and coordination with other care providers 2. 24/7 contact phone numbers for assistance for urgent and routine care needs. 3. Service will only be billed when office clinical staff spend 20 minutes or more in a month to coordinate care. 4. Only one practitioner may furnish and bill the service in a calendar month. 5. The patient may stop CCM services at any time (effective at the end of the month) by phone call to the office staff. 6. The patient will be responsible for cost sharing (co-pay) of up to 20% of the service fee (after annual deductible is met).  Patient agreed to services and verbal consent obtained.     PLAN A member of the care management team will follow-up with Jordan [name] next month.  [facility] [phone]

## 2019-10-24 NOTE — Patient Instructions (Addendum)
Thank you for allowing the Chronic Care Management team to participate in your care.  Goals Addressed            This Visit's Progress   . Chronic Disease Management       CARE PLAN ENTRY (see longitudinal plan of care for additional care plan information)  Current Barriers:  . Chronic Disease Management support and education needs related to Hypertension, Diabetes, Chronic Bronchitis, HLD and CKD.   Case Manager Clinical Goal(s):  Over the next 120 days, patient will: . Attend all medical appointments as scheduled. . Take all medications as prescribed.  . Monitor blood pressure and record readings. . Consistently monitor fasting blood glucose and maintain a log. . Follow safety measures to prevent falls and injuries.  Interventions:  . Inter-disciplinary care team collaboration (see longitudinal plan of care) . Reviewed medications. Advised to take medications as prescribed. Advised to notify provider if unable to tolerate prescribed regimen and avoid abruptly discontinuing medications. Encouraged to notify care management team with concerns regarding medication management or prescription costs. . Discussed established blood pressure parameters and indications for notifying provider. Encouraged to monitor BP routinely if unable to monitor daily and record readings. . Reviewed fasting blood glucose readings. Reviewed s/sx of hypoglycemia and hyperglycemia along with recommended interventions. Encouraged to consistently monitor blood glucose levels and maintain a log. Reports not monitoring his levels today. Reports most fasting readings have ranged in the 140's. . Discussed current activity level. Provided information regarding safety and fall prevention measures. Encouraged to use cane or assistive device as needed when ambulating. Reports ambulating well. Attempts to walk 30 min/day five days a week. Denies complaints of shortness of breath or chest discomfort during prolonged walks.  Denies recent falls.  . Reviewed pending appointments. Encouraged to attend scheduled medical appointments to prevent delays in care. Encouraged to notify care management team with concerns regarding transportation.  . Discussed plans for ongoing care management and follow up. Provided direct contact information. Reports doing well and independently performing care. States he is usually out of the area visiting/assisting his sister approximately 2 weeks a month. Denies immediate care management needs. The care management team will plan to follow-up next month.   Patient Self Care Activities:  . Self administers medications . Attends scheduled provider appointments . Calls pharmacy for medication refills . Performs ADL's independently . Performs IADL's independently   Initial goal documentation         Mr. Karman was given information about Chronic Care Management services including:  1. CCM service includes personalized support from designated clinical staff supervised by his physician, including individualized plan of care and coordination with other care providers 2. 24/7 contact phone numbers for assistance for urgent and routine care needs. 3. Service will only be billed when office clinical staff spend 20 minutes or more in a month to coordinate care. 4. Only one practitioner may furnish and bill the service in a calendar month. 5. The patient may stop CCM services at any time (effective at the end of the month) by phone call to the office staff. 6. The patient will be responsible for cost sharing (co-pay) of up to 20% of the service fee (after annual deductible is met).  Patient agreed to services and verbal consent obtained.    Mr. Orgeron verbalized understanding of the information discussed during the telephonic outreach today. Declined need for a mailed/printed copy of the instructions.   A member of the care management team will follow-up with  Mr. Kingsley next  month.   [facility] [phone]

## 2019-10-31 ENCOUNTER — Ambulatory Visit: Payer: Self-pay

## 2019-10-31 NOTE — Chronic Care Management (AMB) (Signed)
  Chronic Care Management   Note  10/31/2019 Name: BRYCEN BEAN MRN: [id number] DOB: 07/31/1940    Care Coordination Only: Resources and Documents submitted.    Follow up plan: The care management team will follow-up with Mr. Sheeley as scheduled next month.    [facility] [phone]

## 2019-11-16 ENCOUNTER — Other Ambulatory Visit: Payer: Medicare HMO

## 2019-11-24 ENCOUNTER — Encounter: Payer: Self-pay | Admitting: Podiatry

## 2019-11-24 ENCOUNTER — Other Ambulatory Visit: Payer: Self-pay

## 2019-11-24 ENCOUNTER — Ambulatory Visit: Payer: Medicare HMO | Admitting: Podiatry

## 2019-11-24 DIAGNOSIS — B351 Tinea unguium: Secondary | ICD-10-CM

## 2019-11-24 DIAGNOSIS — L84 Corns and callosities: Secondary | ICD-10-CM | POA: Diagnosis not present

## 2019-11-24 DIAGNOSIS — N1831 Chronic kidney disease, stage 3a: Secondary | ICD-10-CM | POA: Diagnosis not present

## 2019-11-24 DIAGNOSIS — E1142 Type 2 diabetes mellitus with diabetic polyneuropathy: Secondary | ICD-10-CM

## 2019-11-24 DIAGNOSIS — M79676 Pain in unspecified toe(s): Secondary | ICD-10-CM

## 2019-11-24 NOTE — Progress Notes (Signed)
Complaint:  Visit Type: Patient returns to my office for risk foot care.  This patient requires this care by a professional since this patient will be at a high risk due to having diabetes.  This patient is unable to cut his own nails since he cannot reach his nails.  This patient presents for at risk foot care today.  He also states that he has a painful corn on the second toe right foot.  Podiatric Exam: Vascular: dorsalis pedis and posterior tibial pulses are palpable bilateral. Capillary return is immediate. Temperature gradient is WNL. Skin turgor WNL  Sensorium: Normal Semmes Weinstein monofilament test. Normal tactile sensation bilaterally. Nail Exam: Pt has thick disfigured discolored nails with subungual debris noted bilateral entire nail hallux through fifth toenails.  Nails are thick painful and deformed 1-5  B/L. Ulcer Exam: There is no evidence of ulcer or pre-ulcerative changes or infection. Orthopedic Exam: Muscle tone and strength are WNL. No limitations in general ROM. No crepitus or effusions noted. HAV  B/L. Hammer toes second  B/L Skin: No Porokeratosis. No infection or ulcers.  Corn second toe medial aspect @ PIPJ.   Diagnosis:  Onychomycosis, , Pain in right toe, pain in left toes, Corn/callus  Treatment & Plan Procedures and Treatment: Consent by patient was obtained for treatment procedures. The patient understood the discussion of treatment and procedures well. All questions were answered thoroughly reviewed. Debridement of mycotic and hypertrophic toenails, 1 through 5 bilateral and clearing of subungual debris. No ulceration, no infection noted.   Debride corn second toe right foot with # 15 blade. Told this patient to return for periodic foot evaluation to help reduce potential of at risk complications. Return Visit-Office Procedure: Patient instructed to return to the office for a follow up visit 3 months for continued evaluation and treatment.    Gardiner Barefoot DPM

## 2019-12-01 ENCOUNTER — Ambulatory Visit (INDEPENDENT_AMBULATORY_CARE_PROVIDER_SITE_OTHER): Payer: Medicare HMO

## 2019-12-01 DIAGNOSIS — Z0000 Encounter for general adult medical examination without abnormal findings: Secondary | ICD-10-CM

## 2019-12-01 NOTE — Patient Instructions (Addendum)
Jordan [name] , Thank you for taking time to come for your Medicare Wellness Visit. I appreciate your ongoing commitment to your health goals. Please review the following plan we discussed and let me know if I can assist you in the future.   Screening recommendations/referrals: Colonoscopy: done 04/22/19. Repeat screening colonoscopy recommended, please follow up with GI.  Recommended yearly ophthalmology/optometry visit for glaucoma screening and checkup Recommended yearly dental visit for hygiene and checkup  Vaccinations: Influenza vaccine: due Pneumococcal vaccine: done 11/30/18 Tdap vaccine: done 02/18/17 Shingles vaccine: Shingrix discussed. Please contact your pharmacy for coverage information.  Covid-19: done 04/07/19 7 05/16/19  Advanced directives: Advance directive discussed with you today. I have provided a copy for you to complete at home and have notarized. Once this is complete please bring a copy in to our office so we can scan it into your chart.  Conditions/risks identified: Recommend healthy eating and activity to lower A1c  Next appointment: Follow up in one year for your annual wellness visit.   Preventive Care 79 Years and Older, Male Preventive care refers to lifestyle choices and visits with your health care provider that can promote health and wellness. What does preventive care include?  A yearly physical exam. This is also called an annual well check.  Dental exams once or twice a year.  Routine eye exams. Ask your health care provider how often you should have your eyes checked.  Personal lifestyle choices, including:  Daily care of your teeth and gums.  Regular physical activity.  Eating a healthy diet.  Avoiding tobacco and drug use.  Limiting alcohol use.  Practicing safe sex.  Taking low doses of aspirin every day.  Taking vitamin and mineral supplements as recommended by your health care provider. What happens during an annual well check? The  services and screenings done by your health care provider during your annual well check will depend on your age, overall health, lifestyle risk factors, and family history of disease. Counseling  Your health care provider may ask you questions about your:  Alcohol use.  Tobacco use.  Drug use.  Emotional well-being.  Home and relationship well-being.  Sexual activity.  Eating habits.  History of falls.  Memory and ability to understand (cognition).  Work and work Statistician. Screening  You may have the following tests or measurements:  Height, weight, and BMI.  Blood pressure.  Lipid and cholesterol levels. These may be checked every 5 years, or more frequently if you are over 66 years old.  Skin check.  Lung cancer screening. You may have this screening every year starting at age 51 if you have a 30-pack-year history of smoking and currently smoke or have quit within the past 15 years.  Fecal occult blood test (FOBT) of the stool. You may have this test every year starting at age 57.  Flexible sigmoidoscopy or colonoscopy. You may have a sigmoidoscopy every 5 years or a colonoscopy every 10 years starting at age 70.  Prostate cancer screening. Recommendations will vary depending on your family history and other risks.  Hepatitis C blood test.  Hepatitis B blood test.  Sexually transmitted disease (STD) testing.  Diabetes screening. This is done by checking your blood sugar (glucose) after you have not eaten for a while (fasting). You may have this done every 1-3 years.  Abdominal aortic aneurysm (AAA) screening. You may need this if you are a current or former smoker.  Osteoporosis. You may be screened starting at age 68 if  you are at high risk. Talk with your health care provider about your test results, treatment options, and if necessary, the need for more tests. Vaccines  Your health care provider may recommend certain vaccines, such as:  Influenza  vaccine. This is recommended every year.  Tetanus, diphtheria, and acellular pertussis (Tdap, Td) vaccine. You may need a Td booster every 10 years.  Zoster vaccine. You may need this after age 87.  Pneumococcal 13-valent conjugate (PCV13) vaccine. One dose is recommended after age 78.  Pneumococcal polysaccharide (PPSV23) vaccine. One dose is recommended after age 88. Talk to your health care provider about which screenings and vaccines you need and how often you need them. This information is not intended to replace advice given to you by your health care provider. Make sure you discuss any questions you have with your health care provider. Document Released: 03/30/2015 Document Revised: 11/21/2015 Document Reviewed: 01/02/2015 Elsevier Interactive Patient Education  2017 [place] Prevention in the Home Falls can cause injuries. They can happen to people of all ages. There are many things you can do to make your home safe and to help prevent falls. What can I do on the outside of my home?  Regularly fix the edges of walkways and driveways and fix any cracks.  Remove anything that might make you trip as you walk through a door, such as a raised step or threshold.  Trim any bushes or trees on the path to your home.  Use bright outdoor lighting.  Clear any walking paths of anything that might make someone trip, such as rocks or tools.  Regularly check to see if handrails are loose or broken. Make sure that both sides of any steps have handrails.  Any raised decks and porches should have guardrails on the edges.  Have any leaves, snow, or ice cleared regularly.  Use sand or salt on walking paths during winter.  Clean up any spills in your garage right away. This includes oil or grease spills. What can I do in the bathroom?  Use night lights.  Install grab bars by the toilet and in the tub and shower. Do not use towel bars as grab bars.  Use non-skid mats or decals  in the tub or shower.  If you need to sit down in the shower, use a plastic, non-slip stool.  Keep the floor dry. Clean up any water that spills on the floor as soon as it happens.  Remove soap buildup in the tub or shower regularly.  Attach bath mats securely with double-sided non-slip rug tape.  Do not have throw rugs and other things on the floor that can make you trip. What can I do in the bedroom?  Use night lights.  Make sure that you have a light by your bed that is easy to reach.  Do not use any sheets or blankets that are too big for your bed. They should not hang down onto the floor.  Have a firm chair that has side arms. You can use this for support while you get dressed.  Do not have throw rugs and other things on the floor that can make you trip. What can I do in the kitchen?  Clean up any spills right away.  Avoid walking on wet floors.  Keep items that you use a lot in easy-to-reach places.  If you need to reach something above you, use a strong step stool that has a grab bar.  Keep electrical cords  out of the way.  Do not use floor polish or wax that makes floors slippery. If you must use wax, use non-skid floor wax.  Do not have throw rugs and other things on the floor that can make you trip. What can I do with my stairs?  Do not leave any items on the stairs.  Make sure that there are handrails on both sides of the stairs and use them. Fix handrails that are broken or loose. Make sure that handrails are as long as the stairways.  Check any carpeting to make sure that it is firmly attached to the stairs. Fix any carpet that is loose or worn.  Avoid having throw rugs at the top or bottom of the stairs. If you do have throw rugs, attach them to the floor with carpet tape.  Make sure that you have a light switch at the top of the stairs and the bottom of the stairs. If you do not have them, ask someone to add them for you. What else can I do to help  prevent falls?  Wear shoes that:  Do not have high heels.  Have rubber bottoms.  Are comfortable and fit you well.  Are closed at the toe. Do not wear sandals.  If you use a stepladder:  Make sure that it is fully opened. Do not climb a closed stepladder.  Make sure that both sides of the stepladder are locked into place.  Ask someone to hold it for you, if possible.  Clearly mark and make sure that you can see:  Any grab bars or handrails.  First and last steps.  Where the edge of each step is.  Use tools that help you move around (mobility aids) if they are needed. These include:  Canes.  Walkers.  Scooters.  Crutches.  Turn on the lights when you go into a dark area. Replace any light bulbs as soon as they burn out.  Set up your furniture so you have a clear path. Avoid moving your furniture around.  If any of your floors are uneven, fix them.  If there are any pets around you, be aware of where they are.  Review your medicines with your doctor. Some medicines can make you feel dizzy. This can increase your chance of falling. Ask your doctor what other things that you can do to help prevent falls. This information is not intended to replace advice given to you by your health care provider. Make sure you discuss any questions you have with your health care provider. Document Released: 12/28/2008 Document Revised: 08/09/2015 Document Reviewed: 04/07/2014 Elsevier Interactive Patient Education  2017 [employer].

## 2019-12-01 NOTE — Progress Notes (Signed)
Subjective:   Jordan [name] is a 79 y.o. male who presents for Medicare Annual/Subsequent preventive examination.  Virtual Visit via Telephone Note  I connected with  Jordan [name] on 12/01/19 at  2:10 PM EDT by telephone and verified that I am speaking with the correct person using two identifiers.  Medicare Annual Wellness visit completed telephonically due to Covid-19 pandemic.   Location: Patient: home Provider: [place]   I discussed the limitations, risks, security and privacy concerns of performing an evaluation and management service by telephone and the availability of in person appointments. The patient expressed understanding and agreed to proceed.  Unable to perform video visit due to video visit attempted and failed and/or patient does not have video capability.   Some vital signs may be absent or patient reported.   Clemetine Marker, LPN    Review of Systems     Cardiac Risk Factors include: advanced age (>76men, >80 women);diabetes mellitus;dyslipidemia;male gender;hypertension     Objective:    There were no vitals filed for this visit. There is no height or weight on file to calculate BMI.  Advanced Directives 12/01/2019 04/20/2019 11/30/2018 11/19/2016 08/19/2016 07/28/2016 03/28/2016  Does Patient Have a Medical Advance Directive? No No No Yes No Yes Yes  Type of Advance Directive - - Public librarian;Living will - [place];Living will Living will  Does patient want to make changes to medical advance directive? - - - - - - -  Copy of [place] in Chart? - - - No - copy requested - No - copy requested -  Would patient like information on creating a medical advance directive? Yes (MAU/Ambulatory/Procedural Areas - Information given) No - Patient declined Yes (MAU/Ambulatory/Procedural Areas - Information given) - No - Patient declined - -    Current Medications (verified) Outpatient Encounter Medications as of  12/01/2019  Medication Sig  . ACCU-CHEK FASTCLIX LANCETS MISC 1 each by Subdermal route 2 (two) times daily.  Marland Kitchen allopurinol (ZYLOPRIM) 300 MG tablet Take 1 tablet (300 mg total) by mouth daily.  Marland Kitchen amLODipine (NORVASC) 10 MG tablet Take 1 tablet (10 mg total) by mouth daily.  Marland Kitchen aspirin EC 81 MG tablet Take 1 tablet (81 mg total) by mouth daily. Swallow whole.  Marland Kitchen atorvastatin (LIPITOR) 40 MG tablet Take 1 tablet (40 mg total) by mouth daily.  . Blood Glucose Monitoring Suppl (ACCU-CHEK AVIVA PLUS) w/Device KIT 1 each by Does not apply route 2 (two) times daily. Use as directed  . Ferrous Gluconate 324 (37.5 Fe) MG TABS Take 1 tablet (324 mg total) by mouth daily.  Marland Kitchen glipiZIDE (GLUCOTROL XL) 5 MG 24 hr tablet Take 1 tablet (5 mg total) by mouth daily with breakfast.  . glucose blood (ACCU-CHEK AVIVA PLUS) test strip 1 each by Other route 2 (two) times daily as needed for other. Use as instructed  . latanoprost (XALATAN) 0.005 % ophthalmic solution Place 1 drop into the left eye at bedtime.   . metFORMIN (GLUCOPHAGE-XR) 500 MG 24 hr tablet TAKE 2 TABLETS  DAILY WITH BREAKFAST.  Marland Kitchen niacin 100 MG tablet Take 100 mg by mouth at bedtime.  . Omega-3 Fatty Acids (FISH OIL PO) Take 1 capsule by mouth 2 (two) times daily.   . polyethylene glycol (MIRALAX / GLYCOLAX) packet Take 17 g by mouth at bedtime.   Marland Kitchen spironolactone (ALDACTONE) 50 MG tablet Take by mouth.  . tadalafil (CIALIS) 10 MG tablet Take 1-2 tablets (10-20  mg total) by mouth every three (3) days as needed for erectile dysfunction.  . tamsulosin (FLOMAX) 0.4 MG CAPS capsule Take 1 capsule (0.4 mg total) by mouth daily.   No facility-administered encounter medications on file as of 12/01/2019.    Allergies (verified) Ace inhibitors   History: Past Medical History:  Diagnosis Date  . Aortic stenosis   . Chronic kidney disease, stage II (mild)   . Decreased libido   . Diabetes mellitus without complication ([place])   . Glaucoma   . Gout 2009   . Hernia 5625,6389  . Hyperlipidemia   . Hypertension   . Inguinal hernia without mention of obstruction or gangrene, unilateral or unspecified, (not specified as recurrent)   . Lumbago   . Obesity   . Unspecified constipation    Past Surgical History:  Procedure Laterality Date  . cataract surgery    . COLONOSCOPY  2012  . COLONOSCOPY N/A 04/22/2019   Procedure: COLONOSCOPY;  Surgeon: Mansouraty, Telford Nab., MD;  Location: [facility];  Service: Gastroenterology;  Laterality: N/A;  . EYE SURGERY  2011   cataract  . UMBILICAL HERNIA REPAIR  2011   Family History  Problem Relation Age of Onset  . Heart disease Father   . Hypertension Father   . Diabetes Sister   . Diabetes Brother   . Prostate cancer Brother   . Diabetes Sister   . Kidney disease Neg Hx   . Kidney cancer Neg Hx   . Bladder Cancer Neg Hx   . Colon cancer Neg Hx   . Esophageal cancer Neg Hx   . Inflammatory bowel disease Neg Hx   . Liver disease Neg Hx   . Pancreatic cancer Neg Hx   . Rectal cancer Neg Hx   . Stomach cancer Neg Hx    Social History   Socioeconomic History  . Marital status: Widowed    Spouse name: Not on file  . Number of children: 2  . Years of education: Not on file  . Highest education level: Some college, no degree  Occupational History  . Occupation: retired     Comment: used to [employer]   Tobacco Use  . Smoking status: Former Smoker    Packs/day: 1.00    Years: 10.00    Pack years: 10.00    Types: Cigarettes    Start date: 03/17/1965    Quit date: 11/27/1975    Years since quitting: 44.0  . Smokeless tobacco: Never Used  . Tobacco comment: quit 40 years  Vaping Use  . Vaping Use: Never used  Substance and Sexual Activity  . Alcohol use: Yes    Alcohol/week: 1.0 standard drink    Types: 1 Standard drinks or equivalent per week    Comment: socially - 1 x year  . Drug use: No  . Sexual activity: Yes    Partners: Female  Other Topics Concern  . Not on  file  Social History Narrative   Lives by himself in [place]   Two grown children, daughter in [place] and son in [place]   He goes to [place] to visit his sisters often    Social Determinants of Health   Financial Resource Strain: Low Risk   . Difficulty of Paying Living Expenses: Not very hard  Food Insecurity: No Food Insecurity  . Worried About Charity fundraiser in the Last Year: Never true  . Ran Out of Food in the Last Year: Never true  Transportation Needs: No Transportation  Needs  . Lack of Transportation (Medical): No  . Lack of Transportation (Non-Medical): No  Physical Activity: Sufficiently Active  . Days of Exercise per Week: 5 days  . Minutes of Exercise per Session: 30 min  Stress: No Stress Concern Present  . Feeling of Stress : Not at all  Social Connections: Moderately Integrated  . Frequency of Communication with Friends and Family: More than three times a week  . Frequency of Social Gatherings with Friends and Family: More than three times a week  . Attends Religious Services: More than 4 times per year  . Active Member of Clubs or Organizations: Yes  . Attends Archivist Meetings: More than 4 times per year  . Marital Status: Widowed    Tobacco Counseling Counseling given: Not Answered Comment: quit 40 years   Clinical Intake:  Pre-visit preparation completed: Yes  Pain : No/denies pain     Nutritional Risks: None Diabetes: Yes CBG done?: No Did pt. bring in CBG monitor from home?: No  How often do you need to have someone help you when you read instructions, pamphlets, or other written materials from your doctor or pharmacy?: 1 - Never  Nutrition Risk Assessment:  Has the patient had any N/V/D within the last 2 months?  No  Does the patient have any non-healing wounds?  No  Has the patient had any unintentional weight loss or weight gain?  No   Diabetes:  Is the patient diabetic?  Yes  If diabetic, was a CBG obtained  today?  No  Did the patient bring in their glucometer from home?  No  How often do you monitor your CBG's? daily.   Financial Strains and Diabetes Management:  Are you having any financial strains with the device, your supplies or your medication? No .  Does the patient want to be seen by Chronic Care Management for management of their diabetes?  No  Would the patient like to be referred to a Nutritionist or for Diabetic Management?  No   Diabetic Exams:  Diabetic Eye Exam: Completed 02/21/19 negative retinopathy.   Diabetic Foot Exam: Completed 11/30/18. Pt has been advised about the importance in completing this exam. Pt is scheduled for diabetic foot exam on 12/23/19.    Interpreter Needed?: No  Information entered by :: Clemetine Marker LPN   Activities of Daily Living In your present state of health, do you have any difficulty performing the following activities: 12/01/2019 10/21/2019  Hearing? N N  Comment declines hearing aids -  Vision? N N  Difficulty concentrating or making decisions? N N  Walking or climbing stairs? N N  Dressing or bathing? N N  Doing errands, shopping? N N  Preparing Food and eating ? N -  Using the Toilet? N -  In the past six months, have you accidently leaked urine? N -  Do you have problems with loss of bowel control? N -  Managing your Medications? N -  Managing your Finances? N -  Housekeeping or managing your Housekeeping? N -  Some recent data might be hidden    Patient Care Team: Steele Sizer, MD as PCP - General (Family Medicine) Steele Sizer, MD as PCP - Internal Medicine (Family Medicine) Minna Merritts, MD as PCP - Cardiology (Cardiology) Nori Riis PA-C as Physician Assistant (Urology) Neldon Labella, RN as Case Manager  Indicate any recent Medical Services you may have received from other than [facility] providers in the past year (date may be  approximate).     Assessment:   This is a routine wellness examination for  Jordan [name].  Hearing/Vision screen  Hearing Screening   _0  _1  _2  _3  _4  _5  _6  _7  _8   Right ear:           Left ear:           Comments: Pt states mild hearing difficulty, declines hearing aids.  Vision Screening Comments: Annual vision screenings [facility] Michelene [facility]ady [facility]  Dietary issues and exercise activities discussed: Current Exercise Habits: Home exercise routine, Type of exercise: walking, Time (Minutes): 30, Frequency (Times/Week): 5, Weekly Exercise (Minutes/Week): 150, Intensity: Moderate, Exercise limited by: None identified  Goals    . Chronic Disease Management     CARE PLAN ENTRY (see longitudinal plan of care for additional care plan information)  Current Barriers:  . Chronic Disease Management support and education needs related to Hypertension, Diabetes, Chronic Bronchitis, HLD and CKD.   Case Manager Clinical Goal(s):  Over the next 120 days, patient will: . Attend all medical appointments as scheduled. . Take all medications as prescribed.  . Monitor blood pressure and record readings. . Consistently monitor fasting blood glucose and maintain a log. . Follow safety measures to prevent falls and injuries.  Interventions:  . Inter-disciplinary care team collaboration (see longitudinal plan of care) . Reviewed medications. Advised to take medications as prescribed. Advised to notify provider if unable to tolerate prescribed regimen and avoid abruptly discontinuing medications. Encouraged to notify care management team with concerns regarding medication management or prescription costs. . Discussed established blood pressure parameters and indications for notifying provider. Encouraged to monitor BP routinely if unable to monitor daily and record readings. . Reviewed fasting blood glucose readings. Reviewed s/sx of hypoglycemia and hyperglycemia along with recommended interventions. Encouraged to consistently monitor blood  glucose levels and maintain a log. Reports not monitoring his levels today. Reports most fasting readings have ranged in the 140's. . Discussed current activity level. Provided information regarding safety and fall prevention measures. Encouraged to use cane or assistive device as needed when ambulating. Reports ambulating well. Attempts to walk 30 min/day five days a week. Denies complaints of shortness of breath or chest discomfort during prolonged walks. Denies recent falls.  . Reviewed pending appointments. Encouraged to attend scheduled medical appointments to prevent delays in care. Encouraged to notify care management team with concerns regarding transportation.  . Discussed plans for ongoing care management and follow up. Provided direct contact information. Reports doing well and independently performing care. States he is usually out of the area visiting/assisting his sister approximately 2 weeks a month. Denies immediate care management needs. The care management team will plan to follow-up next month.   Patient Self Care Activities:  . Self administers medications . Attends scheduled provider appointments . Calls pharmacy for medication refills . Performs ADL's independently . Performs IADL's independently   Initial goal documentation     . Patient Stated     Lower A1c to less than 7.0      Depression Screen PHQ 2/9 Scores 12/01/2019 10/21/2019 08/16/2019 04/01/2019 11/30/2018 07/30/2018 11/25/2017  PHQ - 2 Score 0 0 0 0 3 0 0  PHQ- 9 Score - - 0 0 10 0 -    Fall Risk Fall Risk  12/01/2019 10/21/2019 08/16/2019 04/01/2019 11/30/2018  Falls in the past year? 0 0 0 0 0  Number falls in past yr: 0 - - 0 0  Injury with Fall? 0 - - 0  0  Risk for fall due to : No Fall Risks - - - -  Follow up Falls prevention discussed Falls prevention discussed - - -    Any stairs in or around the home? Yes  If so, are there any without handrails? Yes - steps outside  Home free of loose throw rugs in  walkways, pet beds, electrical cords, etc? Yes  Adequate lighting in your home to reduce risk of falls? Yes   ASSISTIVE DEVICES UTILIZED TO PREVENT FALLS:  Life alert? No  Use of a cane, walker or w/c? No  Grab bars in the bathroom? Yes  Shower chair or bench in shower? No  Elevated toilet seat or a handicapped toilet? Yes   TIMED UP AND GO:  Was the test performed? No . Telephonic visit.   Cognitive Function: pt declined 6CIT for 2021 AWV     6CIT Screen 11/30/2018  What Year? 0 points  What month? 0 points  What time? 0 points  Count back from 20 0 points  Months in reverse 0 points  Repeat phrase 6 points  Total Score 6    Immunizations Immunization History  Administered Date(s) Administered  . Influenza, High Dose Seasonal PF 11/27/2015, 11/19/2016, 11/25/2017  . Influenza,inj,Quad PF,6+ Mos 11/09/2014, 11/30/2018  . PFIZER SARS-COV-2 Vaccination 04/07/2019, 05/16/2019  . Pneumococcal Conjugate-13 11/19/2016  . Pneumococcal Polysaccharide-23 11/30/2018  . Tdap 02/18/2017    TDAP status: Up to date   Flu Vaccine status: Declined, Education has been provided regarding the importance of this vaccine but patient still declined. Advised may receive this vaccine at local pharmacy or Health Dept. Aware to provide a copy of the vaccination record if obtained from local pharmacy or Health Dept. Verbalized acceptance and understanding.   Pneumococcal vaccine status: Up to date   Covid-19 vaccine status: Completed vaccines  Qualifies for Shingles Vaccine? Yes   Zostavax completed No   Shingrix Completed?: No.    Education has been provided regarding the importance of this vaccine. Patient has been advised to call insurance company to determine out of pocket expense if they have not yet received this vaccine. Advised may also receive vaccine at local pharmacy or Health Dept. Verbalized acceptance and understanding.  Screening Tests Health Maintenance  Topic Date Due  .  INFLUENZA VACCINE  10/16/2019  . FOOT EXAM  11/30/2019  . URINE MICROALBUMIN  11/30/2019  . HEMOGLOBIN A1C  02/15/2020  . OPHTHALMOLOGY EXAM  02/21/2020  . TETANUS/TDAP  02/19/2027  . COVID-19 Vaccine  Completed  . Hepatitis C Screening  Completed  . PNA vac Low Risk Adult  Completed    Health Maintenance  Health Maintenance Due  Topic Date Due  . INFLUENZA VACCINE  10/16/2019  . FOOT EXAM  11/30/2019  . URINE MICROALBUMIN  11/30/2019    Colorectal Cancer Screening: done 04/22/19. Repeat screening colonoscopy recommended, please follow up with GI.   Lung Cancer Screening: (Low Dose CT Chest recommended if Age 37-80 years, 30 pack-year currently smoking OR have quit w/in 15years.) does not qualify.    Additional Screening:  Hepatitis C Screening: does qualify; Completed 04/20/19  Vision Screening: Recommended annual ophthalmology exams for early detection of glaucoma and other disorders of the eye. Is the patient up to date with their annual eye exam?  Yes  Who is the provider or what is the name of the office in which the patient attends annual eye exams? [facility]  Dental Screening: Recommended annual dental exams for proper oral  hygiene  Community Resource Referral / Chronic Care Management: CRR required this visit?  No   CCM required this visit?  No      Plan:     I have personally reviewed and noted the following in the patient's chart:   . Medical and social history . Use of alcohol, tobacco or illicit drugs  . Current medications and supplements . Functional ability and status . Nutritional status . Physical activity . Advanced directives . List of other physicians . Hospitalizations, surgeries, and ER visits in previous 12 months . Vitals . Screenings to include cognitive, depression, and falls . Referrals and appointments  In addition, I have reviewed and discussed with patient certain preventive protocols, quality metrics, and best practice  recommendations. A written personalized care plan for preventive services as well as general preventive health recommendations were provided to patient.     Clemetine Marker, LPN   [id number]   Nurse Notes: none

## 2019-12-13 ENCOUNTER — Other Ambulatory Visit: Payer: Self-pay | Admitting: Family Medicine

## 2019-12-13 DIAGNOSIS — I10 Essential (primary) hypertension: Secondary | ICD-10-CM

## 2019-12-13 NOTE — Telephone Encounter (Signed)
Requested Prescriptions  Pending Prescriptions Disp Refills  . amLODipine (NORVASC) 10 MG tablet [Pharmacy Med Name: AMLODIPINE BESYLATE 10 MG Tablet] 90 tablet 0    Sig: TAKE 1 TABLET EVERY DAY     Cardiovascular:  Calcium Channel Blockers Passed - 12/13/2019 10:28 PM      Passed - Last BP in normal range    BP Readings from Last 1 Encounters:  09/23/19 126/80         Passed - Valid encounter within last 6 months    Recent Outpatient Visits          3 months ago Type 2 diabetes mellitus with microalbuminuria, without long-term current use of insulin [facility])   [facility] Steele Sizer, MD   7 months ago Hospital discharge follow-up   [facility] Steele Sizer, MD   8 months ago Type 2 diabetes mellitus with microalbuminuria, without long-term current use of insulin [facility])   [facility] Steele Sizer, MD   1 year ago Essential hypertension   [facility] [place], Drue Stager, MD   1 year ago Controlled type 2 diabetes mellitus with microalbuminuria, without long-term current use of insulin [facility])   [facility] Steele Sizer, MD      Future Appointments            In 1 week [place], [facility]   In 1 week Steele Sizer, MD [facility], [place]   In 1 month Gollan, Kathlene November, MD [facility], [place]   In 11 months  [facility], [facility]

## 2019-12-14 ENCOUNTER — Telehealth: Payer: Self-pay

## 2019-12-14 NOTE — Telephone Encounter (Signed)
  Chronic Care Management   Outreach Note  12/14/2019 Name: Jordan [name] MRN: [id number] DOB: Aug 08, 1940  Primary Care Provider: Steele Sizer, MD Reason for referral : Chronic Care Management    An unsuccessful telephone outreach was attempted today. [name] is enrolled in the chronic care management program.    Follow Up Plan:  A HIPAA compliant voice message was left today requesting a return call.     [facility]y [facility] [phone]

## 2019-12-19 NOTE — Progress Notes (Signed)
12/20/2019 2:17 PM   Jordan [name] June 03, 1940 [id number]  Referring provider: Steele Sizer, MD [address] [name],  [name] [address]  Chief Complaint  Patient presents with  . Nocturia    HPI: Patient is a 79 year old male with phimosis and BPH with LU TS who presents today for follow-up.     Phimosis Patient still unable to retract his foreskin.      BPH WITH LUTS  (prostate and/or bladder) His IPSS score today is 20, which is severe lower urinary tract symptomatology. He is unhappy with his quality life due to his urinary symptoms.  PVR is 60 mL.  His previous IPSS score was 16/3.  His previous PVR is 29 mL.    His complaints today are frequency and a weak urinary stream.    He states he was tested for sleep apnea, but he was found not to have it.  I have researched his past records and found a sleep study conducted on 08/15/2009.  This sleep study did find sleep apnea and recommended a sleep study with CPAP titration.    Patient denies any gross hematuria, dysuria or suprapubic/flank pain.  Patient denies any fevers, chills, nausea or vomiting.   Prostate volume 101 cc on contrast CT 04/2019   His brother has a history of non fatal prostate cancer.  His UA was benign.     IPSS    Row Name 12/20/19 1300         International Prostate Symptom Score   How often have you had the sensation of not emptying your bladder? More than half the time     How often have you had to urinate less than every two hours? About half the time     How often have you found you stopped and started again several times when you urinated? About half the time     How often have you found it difficult to postpone urination? Almost always     How often have you had to strain to start urination? Less than half the time     How many times did you typically get up at night to urinate? 3 Times     Total IPSS Score 20       Quality of Life due to urinary symptoms   If you  were to spend the rest of your life with your urinary condition just the way it is now how would you feel about that? Unhappy            Score:  1-7 Mild 8-19 Moderate 20-35 Severe  PMH: Past Medical History:  Diagnosis Date  . Aortic stenosis   . Chronic kidney disease, stage II (mild)   . Decreased libido   . Diabetes mellitus without complication ([place])   . Glaucoma   . Gout 2009  . Hernia 1791,5056  . Hyperlipidemia   . Hypertension   . Inguinal hernia without mention of obstruction or gangrene, unilateral or unspecified, (not specified as recurrent)   . Lumbago   . Obesity   . Unspecified constipation     Surgical History: Past Surgical History:  Procedure Laterality Date  . cataract surgery    . COLONOSCOPY  2012  . COLONOSCOPY N/A 04/22/2019   Procedure: COLONOSCOPY;  Surgeon: Mansouraty, Telford Nab., MD;  Location: [facility];  Service: Gastroenterology;  Laterality: N/A;  . EYE SURGERY  2011   cataract  . UMBILICAL HERNIA REPAIR  2011  Home Medications:  Allergies as of 12/20/2019      Reactions   Ace Inhibitors Other (See Comments)   angioedema      Medication List       Accurate as of December 20, 2019  2:17 PM. If you have any questions, ask your nurse or doctor.        Accu-Chek Aviva Plus test strip Generic drug: glucose blood 1 each by Other route 2 (two) times daily as needed for other. Use as instructed   Accu-Chek Aviva Plus w/Device Kit 1 each by Does not apply route 2 (two) times daily. Use as directed   Accu-Chek FastClix Lancets Misc 1 each by Subdermal route 2 (two) times daily.   allopurinol 300 MG tablet Commonly known as: ZYLOPRIM Take 1 tablet (300 mg total) by mouth daily.   amLODipine 10 MG tablet Commonly known as: NORVASC TAKE 1 TABLET EVERY DAY   aspirin EC 81 MG tablet Take 1 tablet (81 mg total) by mouth daily. Swallow whole.   atorvastatin 40 MG tablet Commonly known as: LIPITOR Take 1 tablet (40 mg total)  by mouth daily.   Ferrous Gluconate 324 (37.5 Fe) MG Tabs Take 1 tablet (324 mg total) by mouth daily.   FISH OIL PO Take 1 capsule by mouth 2 (two) times daily.   glipiZIDE 5 MG 24 hr tablet Commonly known as: GLUCOTROL XL Take 1 tablet (5 mg total) by mouth daily with breakfast.   latanoprost 0.005 % ophthalmic solution Commonly known as: XALATAN Place 1 drop into the left eye at bedtime.   metFORMIN 500 MG 24 hr tablet Commonly known as: GLUCOPHAGE-XR TAKE 2 TABLETS  DAILY WITH BREAKFAST.   niacin 100 MG tablet Take 100 mg by mouth at bedtime.   polyethylene glycol 17 g packet Commonly known as: MIRALAX / GLYCOLAX Take 17 g by mouth at bedtime.   spironolactone 50 MG tablet Commonly known as: ALDACTONE Take by mouth.   tadalafil 10 MG tablet Commonly known as: CIALIS Take 1-2 tablets (10-20 mg total) by mouth every three (3) days as needed for erectile dysfunction.   tamsulosin 0.4 MG Caps capsule Commonly known as: FLOMAX Take 1 capsule (0.4 mg total) by mouth daily.       Allergies:  Allergies  Allergen Reactions  . Ace Inhibitors Other (See Comments)    angioedema    Family History: Family History  Problem Relation Age of Onset  . Heart disease Father   . Hypertension Father   . Diabetes Sister   . Diabetes Brother   . Prostate cancer Brother   . Diabetes Sister   . Kidney disease Neg Hx   . Kidney cancer Neg Hx   . Bladder Cancer Neg Hx   . Colon cancer Neg Hx   . Esophageal cancer Neg Hx   . Inflammatory bowel disease Neg Hx   . Liver disease Neg Hx   . Pancreatic cancer Neg Hx   . Rectal cancer Neg Hx   . Stomach cancer Neg Hx     Social History:  reports that he quit smoking about 44 years ago. His smoking use included cigarettes. He started smoking about 54 years ago. He has a 10.00 pack-year smoking history. He has never used smokeless tobacco. He reports current alcohol use of about 1.0 standard drink of alcohol per week. He reports  that he does not use drugs.  ROS: For pertinent review of systems please refer to history of present illness  Physical Exam: BP 139/86   Pulse 86   Ht '5\' 9"'  (1.753 m)   Wt 300 lb (136.1 kg)   BMI 44.30 kg/m   Constitutional:  Well nourished. Alert and oriented, No acute distress. HEENT: [place] AT, mask in place.  Trachea midline Cardiovascular: No clubbing, cyanosis, or edema. Respiratory: Normal respiratory effort, no increased work of breathing. GU: No CVA tenderness.  No bladder fullness or masses.  Patient with uncircumcised phallus.  Foreskin could not be retracted.  Urethral meatus is patent.  No penile discharge. No penile lesions or rashes. Scrotum without lesions, cysts, rashes and/or edema.  Testicles are located scrotally bilaterally. No masses are appreciated in the testicles. Left and right epididymis are normal. Hydroceles are present bilaterally. Rectal: Patient with  normal sphincter tone. Anus and perineum without scarring or rashes. No rectal masses are appreciated. Prostate is approximately 60 +grams, could only palpate the apex, no nodules are appreciated. Seminal vesicles could not be palpated Skin: No rashes, bruises or suspicious lesions. Lymph: No inguinal adenopathy. Neurologic: Grossly intact, no focal deficits, moving all 4 extremities. Psychiatric: Normal mood and affect.  Laboratory Data: Lab Results  Component Value Date   WBC 6.1 05/03/2019   HGB 11.7 (L) 05/03/2019   HCT 35.2 (L) 05/03/2019   MCV 91.2 05/03/2019   PLT 250 05/03/2019    Lab Results  Component Value Date   CREATININE 0.96 09/23/2019    Lab Results  Component Value Date   HGBA1C 7.6 (A) 08/16/2019        Component Value Date/Time   CHOL 127 11/30/2018 0000   CHOL 131 08/27/2015 1158   HDL 46 11/30/2018 0000   HDL 40 08/27/2015 1158   CHOLHDL 2.8 11/30/2018 0000   VLDL 20 07/28/2016 1248   LDLCALC 68 11/30/2018 0000    Lab Results  Component Value Date   AST 19  05/03/2019   Lab Results  Component Value Date   ALT 12 05/03/2019   Urinalysis Component     Latest Ref Rng & Units 12/20/2019  Specific Gravity, UA     1.005 - 1.030 1.025  pH, UA     5.0 - 7.5 5.5  Color, UA     Yellow Yellow  Appearance Ur     Clear Clear  Leukocytes,UA     Negative Negative  Protein,UA     Negative/Trace Negative  Glucose, UA     Negative 2+ (A)  Ketones, UA     Negative Negative  RBC, UA     Negative 1+ (A)  Bilirubin, UA     Negative Negative  Urobilinogen, Ur     0.2 - 1.0 mg/dL 0.2  Nitrite, UA     Negative Negative  Microscopic Examination      See below:   Component     Latest Ref Rng & Units 12/20/2019  WBC, UA     0 - 5 /hpf 0-5  RBC     4.20 - 5.80 Million/uL 0-2  Epithelial Cells (non renal)     0 - 10 /hpf 0-10  Renal Epithel, UA     None seen /hpf 0-10 (A)  Bacteria, UA     None seen/Few None seen    I have reviewed the labs  Pertinent imaging CLINICAL DATA:  79 year old male with rectal bleeding. Concern for acute diverticulitis.  EXAM: CT ABDOMEN AND PELVIS WITH CONTRAST  TECHNIQUE: Multidetector CT imaging of the abdomen and pelvis was performed using the standard protocol following  bolus administration of intravenous contrast.  CONTRAST:  171m OMNIPAQUE IOHEXOL 300 MG/ML  SOLN  COMPARISON:  CT of the abdomen pelvis dated 10/28/2012.  FINDINGS: Lower chest: The visualized lung bases are clear. Three vessel coronary vascular calcification noted.  No intra-abdominal free air or free fluid.  Hepatobiliary: No focal liver abnormality is seen. No gallstones, gallbladder wall thickening, or biliary dilatation.  Pancreas: Unremarkable. No pancreatic ductal dilatation or surrounding inflammatory changes.  Spleen: Normal in size without focal abnormality.  Adrenals/Urinary Tract: The adrenal glands are unremarkable. There is a horseshoe kidney. Several renal cysts measuring up to 3.5 cm in the  inferior pole of the right kidney. Multiple additional subcentimeter hypodense lesions are too small to characterize. There is mild fullness of the left renal collecting system. No hydronephrosis on right. There is symmetric enhancement and excretion of contrast by both kidneys. The visualized ureters appear unremarkable. There is a small anterior bladder dome diverticula.  Stomach/Bowel: There is severe sigmoid diverticulosis without active inflammatory changes. Additional scattered colonic diverticula noted. There is no bowel obstruction or active inflammation. The appendix is normal. Postsurgical changes of the bowel with anastomotic sutures noted.  Vascular/Lymphatic: Advanced aortoiliac atherosclerotic disease. The IVC is unremarkable. No portal venous gas. There is no adenopathy.  Reproductive: Enlarged prostate gland measuring approximately 5.5 cm in transverse axial diameter.  Other: Postsurgical changes of ventral hernia repair. A chronic appearing collection in the midline subcutaneous soft tissues of the anterior abdominal wall along the hernia repair measures approximately 7.5 x 2.0 cm in greatest axial dimensions and 11 cm in craniocaudal length.  Musculoskeletal: Multilevel degenerative changes of the spine. No acute osseous pathology.  IMPRESSION: 1. No acute intra-abdominal or pelvic pathology. 2. Severe sigmoid and diffuse colonic diverticulosis. No bowel obstruction or active inflammation. Normal appendix. No definite evidence of active diverticular bleed by CT. 3. 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 4. 3 vessel coronary vascular calcification and advanced Aortic Atherosclerosis (ICD10-I70.0).   Electronically Signed   By: AAnner CreteM.D.   On: 04/20/2019 19:55  Results for WVIAN, FLUEGEL(MRN [id number] as of 12/20/2019 14:19  Ref. Range 12/20/2019 13:54  Scan Result Unknown 60   I have  independently reviewed the films.  See HPI.   Assessment & Plan:    1. Phimosis Not bothersome to patient Is not wanting a circumcision or dorsal slit at this time Advised to contact u[place]if he experiencing inability to urinate   2. BPH with LUTS IPSS score is 20/5, it is worsening Continue conservative management, avoiding bladder irritants and timed voiding's Most bothersome symptoms is/are weak urinary stream He will continue tamsulosin 0.4 mg daily Recommended the addition of finasteride to help shrink his prostate, but he declined stating he was already on enough medicine He return to clinic in 1 year for IPSS, PVR and exam  Return in about 1 year (around 12/19/2020) for IPSS, PVR and exam.  These notes generated with voice recognition software. I apologize for typographical errors.  SZara Council PA-C  [facility] [address]SLa JoyaBBlackhawk [place] [phone]

## 2019-12-20 ENCOUNTER — Other Ambulatory Visit: Payer: Self-pay

## 2019-12-20 ENCOUNTER — Encounter: Payer: Self-pay | Admitting: Urology

## 2019-12-20 ENCOUNTER — Ambulatory Visit: Payer: Medicare HMO | Admitting: Urology

## 2019-12-20 ENCOUNTER — Ambulatory Visit: Payer: Medicare PPO | Admitting: Urology

## 2019-12-20 VITALS — BP 139/86 | HR 86 | Ht 69.0 in | Wt 300.0 lb

## 2019-12-20 DIAGNOSIS — R351 Nocturia: Secondary | ICD-10-CM | POA: Diagnosis not present

## 2019-12-20 DIAGNOSIS — N471 Phimosis: Secondary | ICD-10-CM | POA: Diagnosis not present

## 2019-12-20 DIAGNOSIS — N401 Enlarged prostate with lower urinary tract symptoms: Secondary | ICD-10-CM

## 2019-12-20 DIAGNOSIS — N138 Other obstructive and reflux uropathy: Secondary | ICD-10-CM

## 2019-12-20 LAB — URINALYSIS, COMPLETE
Bilirubin, UA: NEGATIVE
Ketones, UA: NEGATIVE
Leukocytes,UA: NEGATIVE
Nitrite, UA: NEGATIVE
Protein,UA: NEGATIVE
Specific Gravity, UA: 1.025 (ref 1.005–1.030)
Urobilinogen, Ur: 0.2 mg/dL (ref 0.2–1.0)
pH, UA: 5.5 (ref 5.0–7.5)

## 2019-12-20 LAB — MICROSCOPIC EXAMINATION: Bacteria, UA: NONE SEEN

## 2019-12-20 LAB — BLADDER SCAN AMB NON-IMAGING: Scan Result: 60

## 2019-12-22 NOTE — Progress Notes (Signed)
Name: Jordan [name]   MRN: [id number]    DOB: October 22, 1940   Date:12/23/2019       Progress Note  Subjective  Chief Complaint  Chief Complaint  Patient presents with  . Follow-up    4 month follow up    HPI  DM with microalbuminuria , obesity and HTN and ED: he is back on Glipizide 68m ERand still on Metformin one dailyHgbA1C used to be above 9% but he has changed his diet, walking daily for about 30 minutes , and is still  taking medication as prescribed andA1Chas been stable in the mid 7 range.Viagra is working well and uses prn for ED.He cannot take ACE because of history of angioedema, bpis controlled with Norvasc.He denies polyphagia, polydipsia but has polyuria - also has BPHHe denies hypoglycemic episodeslately, he agreed in increasing dose of metformin to 1500 mg daily today   Chronic bronchitis: he used to smoke but quit many years ago, he still has a cough that is productive at times, coughs iin the mornings and has a pale yellow sputum no wheezing or SOB. He does not want any inhalers   [facility], on Flomax now   HTN: he is onNorvasc and bp is towards low end of normal, he denies chest pain or orthostatic changes . He has lower extremity edema that has been stable over the years   Hyperlipidemia: taking lipitor and denies side effects, no myalgia.Reviewed last las and LDL was down to 77 we will recheck labs today   Obesity :his weight has been stable over the past year, he states continues to avoid sweets and has been walking   Aortic Valve stenosis:seen byRyan Dunn , PA at Dr. G[facility]ivan [facility]ulloffice may 2020 , no syncope or chest pain, had echo normal EF. Unchanged  Atherosclerosis of aorta: he is on statin therapy, bp under control, discussed aspirin daily   History of anemia: secondary to rectal bleeding, last HCT has improved, no sob, pica, he is no longer taking iron supplementation. We will  recheck labs today   Gout: no recent episodes, taking allopurinol and not on diuretics - he had spironolactone on his list but states not taking it   Claudication: going on for months, used to walk for one hour but now after 30 minutes he needs to stop to rest because legs feels heavy and aching, once he stops he is able to walk again   Gait problems: he has OA, he states he feels down because he no longer can do things he enjoys, he has OA and also having claudication now, he feels like a walker may help with symptoms. Discussed scooter also, but he would like to try walker first, rx was written for patient today   Patient Active Problem List   Diagnosis Date Noted  . Diverticulosis of colon with hemorrhage 05/26/2019  . History of anemia 05/26/2019  . Chronic kidney disease (CKD) stage G3a/A1, moderately decreased glomerular filtration rate (GFR) between 45-59 mL/min/1.73 square meter and albuminuria creatinine ratio less than 30 mg/g (HCC) 05/23/2019  . Atherosclerosis of aorta ([place] 04/26/2019  . Rectal bleed 04/22/2019  . Rectal bleeding 04/20/2019  . Abnormal liver function 04/20/2019  . Chronic bronchitis ([facility] 07/30/2018  . Bilateral hydrocele 11/27/2015  . Mass, scrotum 11/27/2015  . Ventral hernia without obstruction or gangrene 11/27/2015  . Controlled type 2 diabetes mellitus with microalbuminuria ([place] 10/26/2015  . 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  Hyperlipidemia 11/07/2014  . Essential hypertension 11/07/2014  . Obesity, Class III, BMI 40-49.9 (morbid obesity) ([place]) 11/07/2014  . Gout 11/07/2014  . Colon cancer screening 11/07/2014  . 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  . Epistaxis 09/21/2014  . 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 Aortic valve stenosis 12/02/2012  . Constipation 10/26/2012  . Incisional hernia, without obstruction or gangrene 10/26/2012  . Left inguinal hernia     Past Surgical  History:  Procedure Laterality Date  . cataract surgery    . COLONOSCOPY  2012  . COLONOSCOPY N/A 04/22/2019   Procedure: COLONOSCOPY;  Surgeon: Mansouraty, Telford Nab., MD;  Location: [facility];  Service: Gastroenterology;  Laterality: N/A;  . EYE SURGERY  2011   cataract  . UMBILICAL HERNIA REPAIR  2011    Family History  Problem Relation Age of Onset  . Heart disease Father   . Hypertension Father   . Diabetes Sister   . Diabetes Brother   . Prostate cancer Brother   . Diabetes Sister   . Kidney disease Neg Hx   . Kidney cancer Neg Hx   . Bladder Cancer Neg Hx   . Colon cancer Neg Hx   . Esophageal cancer Neg Hx   . Inflammatory bowel disease Neg Hx   . Liver disease Neg Hx   . Pancreatic cancer Neg Hx   . Rectal cancer Neg Hx   . Stomach cancer Neg Hx     Social History   Tobacco Use  . Smoking status: Former Smoker    Packs/day: 1.00    Years: 10.00    Pack years: 10.00    Types: Cigarettes    Start date: 03/17/1965    Quit date: 11/27/1975    Years since quitting: 44.1  . Smokeless tobacco: Never Used  . Tobacco comment: quit 40 years  Substance Use Topics  . Alcohol use: Yes    Alcohol/week: 1.0 standard drink    Types: 1 Standard drinks or equivalent per week    Comment: socially - 1 x year     Current Outpatient Medications:  .  ACCU-CHEK FASTCLIX LANCETS MISC, 1 each by Subdermal route 2 (two) times daily., Disp: 102 each, Rfl: 2 .  allopurinol (ZYLOPRIM) 300 MG tablet, Take 1 tablet (300 mg total) by mouth daily., Disp: 90 tablet, Rfl: 1 .  amLODipine (NORVASC) 10 MG tablet, Take 1 tablet (10 mg total) by mouth daily., Disp: 90 tablet, Rfl: 1 .  aspirin EC 81 MG tablet, Take 1 tablet (81 mg total) by mouth daily. Swallow whole., Disp: , Rfl:  .  atorvastatin (LIPITOR) 40 MG tablet, Take 1 tablet (40 mg total) by mouth daily., Disp: 90 tablet, Rfl: 1 .  Blood Glucose Monitoring Suppl (ACCU-CHEK AVIVA PLUS) w/Device KIT, 1 each by Does not apply route  2 (two) times daily. Use as directed, Disp: 100 kit, Rfl: 12 .  glipiZIDE (GLUCOTROL XL) 5 MG 24 hr tablet, Take 1 tablet (5 mg total) by mouth daily with breakfast., Disp: 90 tablet, Rfl: 1 .  glucose blood (ACCU-CHEK AVIVA PLUS) test strip, 1 each by Other route 2 (two) times daily as needed for other. Use as instructed, Disp: 200 strip, Rfl: 2 .  latanoprost (XALATAN) 0.005 % ophthalmic solution, Place 1 drop into the left eye at bedtime. , Disp: , Rfl:  .  metFORMIN (GLUCOPHAGE-XR) 750 MG 24 hr tablet, Take 2 tablets (1,500 mg total) by mouth daily with breakfast., Disp: 180 tablet, Rfl: 1 .  niacin 100 MG tablet, Take 100 mg by mouth at bedtime., Disp: , Rfl:  .  Omega-3 Fatty Acids (FISH OIL PO), Take 1 capsule by mouth 2 (two) times daily. , Disp: , Rfl:  .  polyethylene glycol (MIRALAX / GLYCOLAX) packet, Take 17 g by mouth at bedtime. , Disp: , Rfl:  .  tadalafil (CIALIS) 10 MG tablet, Take 1-2 tablets (10-20 mg total) by mouth every three (3) days as needed for erectile dysfunction., Disp: 30 tablet, Rfl: 0 .  tamsulosin (FLOMAX) 0.4 MG CAPS capsule, Take 1 capsule (0.4 mg total) by mouth daily., Disp: 90 capsule, Rfl: 1  Allergies  Allergen Reactions  . Ace Inhibitors Other (See Comments)    angioedema    I personally reviewed active problem list, medication list, allergies, family history, social history, health maintenance with the patient/caregiver today.   ROS  Constitutional: Negative for fever or significant  weight change.  Respiratory: Negative for cough and shortness of breath.   Cardiovascular: Negative for chest pain or palpitations.  Gastrointestinal: Negative for abdominal pain, no bowel changes.  Musculoskeletal: positive  for gait problem but no joint swelling.  Skin: Negative for rash.  Neurological: Negative for dizziness or headache.  No other specific complaints in a complete review of systems (except as listed in HPI above).  Objective  Vitals:    12/23/19 1152  BP: 110/70  Pulse: 86  Resp: 16  Temp: 97.7 F (36.5 C)  SpO2: 100%  Weight: 296 lb 14.4 oz (134.7 kg)  Height: _0  (1.753 m)    Body mass index is 43.84 kg/m.  Physical Exam  Constitutional: Patient appears well-developed and well-nourished. Obese  No distress.  HEENT: head atraumatic, normocephalic, pupils equal and reactive to light, neck supple Cardiovascular: Normal rate, regular rhythm and normal heart sounds.  No murmur heard. No BLE edema. Muscular Skeletal: crepitus with extension of both knees Pulmonary/Chest: Effort normal and breath sounds normal. No respiratory distress. Abdominal: Soft.  There is no tenderness. Psychiatric: Patient has a normal mood and affect. behavior is normal. Judgment and thought content normal.  Recent Results (from the past 2160 hour(s))  Urinalysis, Complete     Status: Abnormal   Collection Time: 12/20/19  1:38 PM  Result Value Ref Range   Specific Gravity, UA 1.025 1.005 - 1.030   pH, UA 5.5 5.0 - 7.5   Color, UA Yellow Yellow   Appearance Ur Clear Clear   Leukocytes,UA Negative Negative   Protein,UA Negative Negative/Trace   Glucose, UA 2+ (A) Negative   Ketones, UA Negative Negative   RBC, UA 1+ (A) Negative   Bilirubin, UA Negative Negative   Urobilinogen, Ur 0.2 0.2 - 1.0 mg/dL   Nitrite, UA Negative Negative   Microscopic Examination See below:   Microscopic Examination     Status: Abnormal   Collection Time: 12/20/19  1:38 PM   Urine  Result Value Ref Range   WBC, UA 0-5 0 - 5 /hpf   RBC 0-2 0 - 2 /hpf   Epithelial Cells (non renal) 0-10 0 - 10 /hpf   Renal Epithel, UA 0-10 (A) None seen /hpf   Bacteria, UA None seen None seen/Few  Bladder Scan (Post Void Residual) in office     Status: None   Collection Time: 12/20/19  1:54 PM  Result Value Ref Range   Scan Result 60   POCT HgB A1C     Status: Abnormal   Collection Time: 12/23/19 11:59 AM  Result  Value Ref Range   Hemoglobin A1C 7.7 (A) 4.0 - 5.6  %   HbA1c POC (<> result, manual entry)     HbA1c, POC (prediabetic range)     HbA1c, POC (controlled diabetic range)      Diabetic Foot Exam: Diabetic Foot Exam - Simple   Simple Foot Form Diabetic Foot exam was performed with the following findings: Yes 12/23/2019  1:29 PM  Visual Inspection See comments: Yes Sensation Testing Intact to touch and monofilament testing bilaterally: Yes Pulse Check Posterior Tibialis and Dorsalis pulse intact bilaterally: Yes Comments Seeing podiatrist       PHQ2/9: Depression screen [facility] 2/9 12/23/2019 12/01/2019 10/21/2019 08/16/2019 04/01/2019  Decreased Interest 2 0 0 0 0  Down, Depressed, Hopeless 1 0 0 0 0  PHQ - 2 Score 3 0 0 0 0  Altered sleeping 2 - - 0 0  Tired, decreased energy 2 - - 0 0  Change in appetite 1 - - 0 0  Feeling bad or failure about yourself  1 - - 0 0  Trouble concentrating 1 - - 0 0  Moving slowly or fidgety/restless 0 - - 0 0  Suicidal thoughts 0 - - 0 0  PHQ-9 Score 10 - - 0 0  Difficult doing work/chores Somewhat difficult - - - -    phq 9 is positive, he refuses medication    Fall Risk: Fall Risk  12/23/2019 12/01/2019 10/21/2019 08/16/2019 04/01/2019  Falls in the past year? 0 0 0 0 0  Number falls in past yr: 0 0 - - 0  Injury with Fall? 0 0 - - 0  Risk for fall due to : - No Fall Risks - - -  Follow up - Falls prevention discussed Falls prevention discussed - -    Functional Status Survey: Is the patient deaf or have difficulty hearing?: No Does the patient have difficulty seeing, even when wearing glasses/contacts?: No Does the patient have difficulty concentrating, remembering, or making decisions?: Yes Does the patient have difficulty walking or climbing stairs?: Yes Does the patient have difficulty dressing or bathing?: No Does the patient have difficulty doing errands alone such as visiting a doctor's office or shopping?: No    Assessment & Plan  1. Type 2 diabetes mellitus with microalbuminuria,  without long-term current use of insulin (HCC)  - POCT HgB A1C - Microalbumin / creatinine urine ratio - COMPLETE METABOLIC PANEL WITH GFR  2. Need for immunization against influenza  - Flu Vaccine QUAD High Dose(Fluad)  3. Essential hypertension  - amLODipine (NORVASC) 10 MG tablet; Take 1 tablet (10 mg total) by mouth daily.  Dispense: 90 tablet; Refill: 1  4. Simple chronic bronchitis ([place])   5. Atherosclerosis of aorta (HCC)  - Lipid panel  6. Obesity, Class III, BMI 40-49.9 (morbid obesity) ([place])  Discussed with the patient the risk posed by an increased BMI. Discussed importance of portion control, calorie counting and at least 150 minutes of physical activity weekly. Avoid sweet beverages and drink more water. Eat at least 6 servings of fruit and vegetables daily   7. Benign prostatic hyperplasia with nocturia  - tamsulosin (FLOMAX) 0.4 MG CAPS capsule; Take 1 capsule (0.4 mg total) by mouth daily.  Dispense: 90 capsule; Refill: 1 - PSA  8. Hypertension associated with diabetes ([place])   9. Aortic valve stenosis, mild   10. Controlled gout  - allopurinol (ZYLOPRIM) 300 MG tablet; Take 1 tablet (300 mg total) by mouth daily.  Dispense:  90 tablet; Refill: 1 - Uric acid  11. Benign hypertension with chronic kidney disease, stage II  - VITAMIN D 25 Hydroxy (Vit-D Deficiency, Fractures)  12. Anemia, unspecified type  - CBC with Differential/Platelet - Iron, TIBC and Ferritin Panel  13. Claudication of both lower extremities ([place])  - Ambulatory referral to Vascular Surgery  14. Mixed hyperlipidemia  - atorvastatin (LIPITOR) 40 MG tablet; Take 1 tablet (40 mg total) by mouth daily.  Dispense: 90 tablet; Refill: 1  15. 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  Dispense: 90 tablet; Refill: 1 - metFORMIN (GLUCOPHAGE-XR) 750 MG 24  hr tablet; Take 2 tablets (1,500 mg total) by mouth daily with breakfast.  Dispense: 180 tablet; Refill: 1  16. ED (erectile dysfunction) of organic origin  - tadalafil (CIALIS) 10 MG tablet; Take 1-2 tablets (10-20 mg total) by mouth every three (3) days as needed for erectile dysfunction.  Dispense: 30 tablet; Refill: 0  17. 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

## 2019-12-23 ENCOUNTER — Ambulatory Visit (INDEPENDENT_AMBULATORY_CARE_PROVIDER_SITE_OTHER): Payer: Medicare HMO | Admitting: Family Medicine

## 2019-12-23 ENCOUNTER — Other Ambulatory Visit: Payer: Self-pay

## 2019-12-23 ENCOUNTER — Encounter: Payer: Self-pay | Admitting: Family Medicine

## 2019-12-23 VITALS — BP 110/70 | HR 86 | Temp 97.7°F | Resp 16 | Ht 69.0 in | Wt 296.9 lb

## 2019-12-23 DIAGNOSIS — I10 Essential (primary) hypertension: Secondary | ICD-10-CM

## 2019-12-23 DIAGNOSIS — I739 Peripheral vascular disease, unspecified: Secondary | ICD-10-CM

## 2019-12-23 DIAGNOSIS — J410 Simple chronic bronchitis: Secondary | ICD-10-CM | POA: Diagnosis not present

## 2019-12-23 DIAGNOSIS — N521 Erectile dysfunction due to diseases classified elsewhere: Secondary | ICD-10-CM

## 2019-12-23 DIAGNOSIS — R351 Nocturia: Secondary | ICD-10-CM

## 2019-12-23 DIAGNOSIS — E782 Mixed hyperlipidemia: Secondary | ICD-10-CM

## 2019-12-23 DIAGNOSIS — I350 Nonrheumatic aortic (valve) stenosis: Secondary | ICD-10-CM

## 2019-12-23 DIAGNOSIS — I700 Atherosclerosis of aorta: Secondary | ICD-10-CM | POA: Diagnosis not present

## 2019-12-23 DIAGNOSIS — M170 Bilateral primary osteoarthritis of knee: Secondary | ICD-10-CM

## 2019-12-23 DIAGNOSIS — Z23 Encounter for immunization: Secondary | ICD-10-CM

## 2019-12-23 DIAGNOSIS — N401 Enlarged prostate with lower urinary tract symptoms: Secondary | ICD-10-CM | POA: Diagnosis not present

## 2019-12-23 DIAGNOSIS — N529 Male erectile dysfunction, unspecified: Secondary | ICD-10-CM

## 2019-12-23 DIAGNOSIS — N182 Chronic kidney disease, stage 2 (mild): Secondary | ICD-10-CM | POA: Diagnosis not present

## 2019-12-23 DIAGNOSIS — E1159 Type 2 diabetes mellitus with other circulatory complications: Secondary | ICD-10-CM

## 2019-12-23 DIAGNOSIS — E1129 Type 2 diabetes mellitus with other diabetic kidney complication: Secondary | ICD-10-CM | POA: Diagnosis not present

## 2019-12-23 DIAGNOSIS — N1831 Chronic kidney disease, stage 3a: Secondary | ICD-10-CM | POA: Diagnosis not present

## 2019-12-23 DIAGNOSIS — R809 Proteinuria, unspecified: Secondary | ICD-10-CM

## 2019-12-23 DIAGNOSIS — I129 Hypertensive chronic kidney disease with stage 1 through stage 4 chronic kidney disease, or unspecified chronic kidney disease: Secondary | ICD-10-CM | POA: Diagnosis not present

## 2019-12-23 DIAGNOSIS — F341 Dysthymic disorder: Secondary | ICD-10-CM

## 2019-12-23 DIAGNOSIS — I152 Hypertension secondary to endocrine disorders: Secondary | ICD-10-CM

## 2019-12-23 DIAGNOSIS — D649 Anemia, unspecified: Secondary | ICD-10-CM

## 2019-12-23 DIAGNOSIS — E1140 Type 2 diabetes mellitus with diabetic neuropathy, unspecified: Secondary | ICD-10-CM

## 2019-12-23 DIAGNOSIS — M109 Gout, unspecified: Secondary | ICD-10-CM

## 2019-12-23 LAB — POCT GLYCOSYLATED HEMOGLOBIN (HGB A1C): Hemoglobin A1C: 7.7 % — AB (ref 4.0–5.6)

## 2019-12-23 MED ORDER — TAMSULOSIN HCL 0.4 MG PO CAPS: 0.4000 mg | ORAL_CAPSULE | Freq: Every day | ORAL | 1 refills | Status: DC

## 2019-12-23 MED ORDER — ATORVASTATIN CALCIUM 40 MG PO TABS: 40.0000 mg | ORAL_TABLET | Freq: Every day | ORAL | 1 refills | Status: DC

## 2019-12-23 MED ORDER — METFORMIN HCL ER 750 MG PO TB24: 1500.0000 mg | ORAL_TABLET | Freq: Every day | ORAL | 1 refills | Status: DC

## 2019-12-23 MED ORDER — TADALAFIL 10 MG PO TABS: 10.0000 mg | ORAL_TABLET | ORAL | 0 refills | Status: DC | PRN

## 2019-12-23 MED ORDER — GLIPIZIDE ER 5 MG PO TB24: 5.0000 mg | ORAL_TABLET | Freq: Every day | ORAL | 1 refills | Status: DC

## 2019-12-23 MED ORDER — AMLODIPINE BESYLATE 10 MG PO TABS: 10.0000 mg | ORAL_TABLET | Freq: Every day | ORAL | 1 refills | Status: DC

## 2019-12-23 MED ORDER — ALLOPURINOL 300 MG PO TABS: 300.0000 mg | ORAL_TABLET | Freq: Every day | ORAL | 1 refills | Status: DC

## 2019-12-24 LAB — CBC WITH DIFFERENTIAL/PLATELET
Absolute Monocytes: 352 cells/uL (ref 200–950)
Basophils Absolute: 31 cells/uL (ref 0–200)
Basophils Relative: 0.7 %
Eosinophils Absolute: 101 cells/uL (ref 15–500)
Eosinophils Relative: 2.3 %
HCT: 42.9 % (ref 38.5–50.0)
Hemoglobin: 13.8 g/dL (ref 13.2–17.1)
Lymphs Abs: 1258 cells/uL (ref 850–3900)
MCH: 28.6 pg (ref 27.0–33.0)
MCHC: 32.2 g/dL (ref 32.0–36.0)
MCV: 89 fL (ref 80.0–100.0)
MPV: 11.5 fL (ref 7.5–12.5)
Monocytes Relative: 8 %
Neutro Abs: 2658 cells/uL (ref 1500–7800)
Neutrophils Relative %: 60.4 %
Platelets: 214 10*3/uL (ref 140–400)
RBC: 4.82 10*6/uL (ref 4.20–5.80)
RDW: 14.8 % (ref 11.0–15.0)
Total Lymphocyte: 28.6 %
WBC: 4.4 10*3/uL (ref 3.8–10.8)

## 2019-12-24 LAB — LIPID PANEL
Cholesterol: 124 mg/dL (ref <200)
HDL: 47 mg/dL (ref >=40)
LDL Cholesterol (Calc): 60 mg/dL (calc)
Non-HDL Cholesterol (Calc): 77 mg/dL (calc) (ref <130)
Total CHOL/HDL Ratio: 2.6 (calc) (ref <5.0)
Triglycerides: 85 mg/dL (ref <150)

## 2019-12-24 LAB — COMPLETE METABOLIC PANEL WITH GFR
AG Ratio: 1.2 (calc) (ref 1.0–2.5)
ALT: 15 U/L (ref 9–46)
AST: 20 U/L (ref 10–35)
Albumin: 4.1 g/dL (ref 3.6–5.1)
Alkaline phosphatase (APISO): 59 U/L (ref 35–144)
BUN: 15 mg/dL (ref 7–25)
CO2: 28 mmol/L (ref 20–32)
Calcium: 9.7 mg/dL (ref 8.6–10.3)
Chloride: 103 mmol/L (ref 98–110)
Creat: 0.92 mg/dL (ref 0.70–1.18)
GFR, Est African American: 92 mL/min/{1.73_m2} (ref >=60)
GFR, Est Non African American: 79 mL/min/{1.73_m2} (ref >=60)
Globulin: 3.5 g/dL (calc) (ref 1.9–3.7)
Glucose, Bld: 169 mg/dL — ABNORMAL HIGH (ref 65–99)
Potassium: 5 mmol/L (ref 3.5–5.3)
Sodium: 137 mmol/L (ref 135–146)
Total Bilirubin: 0.8 mg/dL (ref 0.2–1.2)
Total Protein: 7.6 g/dL (ref 6.1–8.1)

## 2019-12-24 LAB — MICROALBUMIN / CREATININE URINE RATIO
Creatinine, Urine: 161 mg/dL (ref 20–320)
Microalb Creat Ratio: 14 mcg/mg creat (ref <30)
Microalb, Ur: 2.3 mg/dL

## 2019-12-24 LAB — IRON,TIBC AND FERRITIN PANEL
%SAT: 22 % (calc) (ref 20–48)
Ferritin: 44 ng/mL (ref 24–380)
Iron: 80 ug/dL (ref 50–180)
TIBC: 356 mcg/dL (calc) (ref 250–425)

## 2019-12-24 LAB — URIC ACID: Uric Acid, Serum: 4.3 mg/dL (ref 4.0–8.0)

## 2019-12-24 LAB — PSA: PSA: 1.68 ng/mL (ref <=4.0)

## 2019-12-24 LAB — VITAMIN D 25 HYDROXY (VIT D DEFICIENCY, FRACTURES): Vit D, 25-Hydroxy: 76 ng/mL (ref 30–100)

## 2019-12-28 ENCOUNTER — Ambulatory Visit: Payer: Self-pay

## 2019-12-28 DIAGNOSIS — E1129 Type 2 diabetes mellitus with other diabetic kidney complication: Secondary | ICD-10-CM

## 2019-12-28 NOTE — Chronic Care Management (AMB) (Signed)
Chronic Care Management   Follow Up Note   12/28/2019 Name: Jordan [name] MRN: [id number] DOB: 12-11-1940  Primary Care Provider: Steele Sizer, MD Reason for referral : Chronic Care Management   Jordan [name] is a 79 y.o. year old male who is a primary care patient of Jordan Sizer, MD. He is currently engaged with the chronic care management team. A routine telephonic outreach was conducted today.  Review of [name] status, including review of consultants reports, relevant labs and test results was conducted today. Collaboration with appropriate care team members was performed as part of the comprehensive evaluation and provision of chronic care management services.     SDOH (Social Determinants of Health) assessments performed: No    Outpatient Encounter Medications as of 12/28/2019  Medication Sig  . ACCU-CHEK FASTCLIX LANCETS MISC 1 each by Subdermal route 2 (two) times daily.  Marland Kitchen allopurinol (ZYLOPRIM) 300 MG tablet Take 1 tablet (300 mg total) by mouth daily.  Marland Kitchen amLODipine (NORVASC) 10 MG tablet Take 1 tablet (10 mg total) by mouth daily.  Marland Kitchen aspirin EC 81 MG tablet Take 1 tablet (81 mg total) by mouth daily. Swallow whole.  Marland Kitchen atorvastatin (LIPITOR) 40 MG tablet Take 1 tablet (40 mg total) by mouth daily.  . Blood Glucose Monitoring Suppl (ACCU-CHEK AVIVA PLUS) w/Device KIT 1 each by Does not apply route 2 (two) times daily. Use as directed  . glipiZIDE (GLUCOTROL XL) 5 MG 24 hr tablet Take 1 tablet (5 mg total) by mouth daily with breakfast.  . glucose blood (ACCU-CHEK AVIVA PLUS) test strip 1 each by Other route 2 (two) times daily as needed for other. Use as instructed  . latanoprost (XALATAN) 0.005 % ophthalmic solution Place 1 drop into the left eye at bedtime.   . metFORMIN (GLUCOPHAGE-XR) 750 MG 24 hr tablet Take 2 tablets (1,500 mg total) by mouth daily with breakfast.  . niacin 100 MG tablet Take 100 mg by mouth at bedtime.  . Omega-3 Fatty Acids (FISH  OIL PO) Take 1 capsule by mouth 2 (two) times daily.   . polyethylene glycol (MIRALAX / GLYCOLAX) packet Take 17 g by mouth at bedtime.   . tadalafil (CIALIS) 10 MG tablet Take 1-2 tablets (10-20 mg total) by mouth every three (3) days as needed for erectile dysfunction.  . tamsulosin (FLOMAX) 0.4 MG CAPS capsule Take 1 capsule (0.4 mg total) by mouth daily.   No facility-administered encounter medications on file as of 12/28/2019.       Goals Addressed            This Visit's Progress   . Chronic Disease Management       CARE PLAN ENTRY (see longitudinal plan of care for additional care plan information)  Current Barriers:  . Chronic Disease Management support and education needs related to Hypertension, Diabetes, Chronic Bronchitis, HLD and CKD.   Case Manager Clinical Goal(s):  Over the next 120 days, patient will: . Attend all medical appointments as scheduled. . Take all medications as prescribed.  . Monitor blood pressure and record readings. . Consistently monitor fasting blood glucose and maintain a log. . Follow safety measures to prevent falls and injuries.  Interventions:  . Inter-disciplinary care team collaboration (see longitudinal plan of care) . Discussed medications and blood glucose readings. Reports taking medications as prescribed. He is attempting to consistently monitor his blood glucose levels. Reports recent fasting readings have been elevated with a range of 140 to 190. FBS today was  154 mg/dl. Admits that elevated readings are likely due to dietary choices. Discussed nutritional intake and food options to improve compliance with a diabetic diet. Encouraged to continue monitoring and recording readings.    . Reviewed safety measures and discussed changes in activity level. Reports using recommended safety precautions. Denies falls. Reports that his activity tolerance has slightly declined. He still attempts to walk daily but for shorter lengths of time.  Reports he may require a disability placard. Denies falls. Will update team or leave a message for his provider regarding possible need for a rollator walker.   . Discussed plans for care management and follow up. Denies urgent concerns or care management needs. Will plan to outreach again in two months.   Patient Self Care Activities:  . Self administers medications . Attends scheduled provider appointments . Calls pharmacy for medication refills . Performs ADL's independently . Performs IADL's independently   Please see past updates related to this goal by clicking on the "Past Updates" button in the selected goal           PLAN A member of the care management team will follow up with Jordan [name] in two months.    Jordan [name] [facility] [phone]

## 2020-01-17 ENCOUNTER — Ambulatory Visit (INDEPENDENT_AMBULATORY_CARE_PROVIDER_SITE_OTHER): Payer: Medicare HMO | Admitting: Vascular Surgery

## 2020-01-17 ENCOUNTER — Other Ambulatory Visit: Payer: Self-pay

## 2020-01-17 ENCOUNTER — Encounter (INDEPENDENT_AMBULATORY_CARE_PROVIDER_SITE_OTHER): Payer: Self-pay | Admitting: Vascular Surgery

## 2020-01-17 VITALS — BP 153/81 | HR 90 | Resp 16 | Ht 69.0 in | Wt 294.0 lb

## 2020-01-17 DIAGNOSIS — E782 Mixed hyperlipidemia: Secondary | ICD-10-CM

## 2020-01-17 DIAGNOSIS — E1129 Type 2 diabetes mellitus with other diabetic kidney complication: Secondary | ICD-10-CM

## 2020-01-17 DIAGNOSIS — I739 Peripheral vascular disease, unspecified: Secondary | ICD-10-CM | POA: Diagnosis not present

## 2020-01-17 DIAGNOSIS — M7989 Other specified soft tissue disorders: Secondary | ICD-10-CM | POA: Diagnosis not present

## 2020-01-17 DIAGNOSIS — I10 Essential (primary) hypertension: Secondary | ICD-10-CM | POA: Diagnosis not present

## 2020-01-17 DIAGNOSIS — R809 Proteinuria, unspecified: Secondary | ICD-10-CM | POA: Diagnosis not present

## 2020-01-17 NOTE — Assessment & Plan Note (Signed)
blood glucose control important in reducing the progression of atherosclerotic disease. Also, involved in wound healing. On appropriate medications.  

## 2020-01-17 NOTE — Assessment & Plan Note (Signed)
Compression and elevation can help control the swelling.  Venous reflux study to be done in the near future at his convenience.

## 2020-01-17 NOTE — Progress Notes (Signed)
Patient ID: Jordan [name], male   DOB: 04-Jan-1941, 79 y.o.   MRN: [id number]  Chief Complaint  Patient presents with  . New Patient (Initial Visit)    ref Sowles claudication    HPI Jordan [name] is a 79 y.o. male.  I am asked to see the patient by Dr. Ancil Boozer for evaluation of leg pain and claudication.  Over the past couple of years, the patient has noticed more pain in his legs with walking.  He says he used to be able to walk over an hour without his legs hurting.  Now he can walk less than half of that time before having to stop.  The pain starts much quicker than that.  It is mostly in his calves.  It affects both legs about the same.  There is no clear cause or inciting event that started the symptoms.  Strenuous activity or walking up stairs or inclines bring on the pain quicker.  No open ulcerations or sores.  The pain does not wake him at night.     Past Medical History:  Diagnosis Date  . Aortic stenosis   . Chronic kidney disease, stage II (mild)   . Decreased libido   . Diabetes mellitus without complication ([place])   . Glaucoma   . Gout 2009  . Hernia 7116,5790  . Hyperlipidemia   . Hypertension   . Inguinal hernia without mention of obstruction or gangrene, unilateral or unspecified, (not specified as recurrent)   . Lumbago   . Obesity   . Unspecified constipation     Past Surgical History:  Procedure Laterality Date  . cataract surgery    . COLONOSCOPY  2012  . COLONOSCOPY N/A 04/22/2019   Procedure: COLONOSCOPY;  Surgeon: Mansouraty, Telford Nab., MD;  Location: [facility];  Service: Gastroenterology;  Laterality: N/A;  . EYE SURGERY  2011   cataract  . UMBILICAL HERNIA REPAIR  2011     Family History  Problem Relation Age of Onset  . Heart disease Father   . Hypertension Father   . Diabetes Sister   . Diabetes Brother   . Prostate cancer Brother   . Diabetes Sister   . Kidney disease Neg Hx   . Kidney cancer Neg Hx   . Bladder Cancer Neg Hx    . Colon cancer Neg Hx   . Esophageal cancer Neg Hx   . Inflammatory bowel disease Neg Hx   . Liver disease Neg Hx   . Pancreatic cancer Neg Hx   . Rectal cancer Neg Hx   . Stomach cancer Neg Hx      Social History   Tobacco Use  . Smoking status: Former Smoker    Packs/day: 1.00    Years: 10.00    Pack years: 10.00    Types: Cigarettes    Start date: 03/17/1965    Quit date: 11/27/1975    Years since quitting: 44.1  . Smokeless tobacco: Never Used  . Tobacco comment: quit 40 years  Vaping Use  . Vaping Use: Never used  Substance Use Topics  . Alcohol use: Yes    Alcohol/week: 1.0 standard drink    Types: 1 Standard drinks or equivalent per week    Comment: socially - 1 x year  . Drug use: No    Allergies  Allergen Reactions  . Ace Inhibitors Other (See Comments)    angioedema    Current Outpatient Medications  Medication Sig Dispense Refill  . ACCU-CHEK FASTCLIX  LANCETS MISC 1 each by Subdermal route 2 (two) times daily. 102 each 2  . allopurinol (ZYLOPRIM) 300 MG tablet Take 1 tablet (300 mg total) by mouth daily. 90 tablet 1  . amLODipine (NORVASC) 10 MG tablet Take 1 tablet (10 mg total) by mouth daily. 90 tablet 1  . aspirin EC 81 MG tablet Take 1 tablet (81 mg total) by mouth daily. Swallow whole.    Marland Kitchen atorvastatin (LIPITOR) 40 MG tablet Take 1 tablet (40 mg total) by mouth daily. 90 tablet 1  . Blood Glucose Monitoring Suppl (ACCU-CHEK AVIVA PLUS) w/Device KIT 1 each by Does not apply route 2 (two) times daily. Use as directed 100 kit 12  . glipiZIDE (GLUCOTROL XL) 5 MG 24 hr tablet Take 1 tablet (5 mg total) by mouth daily with breakfast. 90 tablet 1  . glucose blood (ACCU-CHEK AVIVA PLUS) test strip 1 each by Other route 2 (two) times daily as needed for other. Use as instructed 200 strip 2  . latanoprost (XALATAN) 0.005 % ophthalmic solution Place 1 drop into the left eye at bedtime.     . metFORMIN (GLUCOPHAGE-XR) 750 MG 24 hr tablet Take 2 tablets (1,500  mg total) by mouth daily with breakfast. 180 tablet 1  . niacin 100 MG tablet Take 100 mg by mouth at bedtime.    . Omega-3 Fatty Acids (FISH OIL PO) Take 1 capsule by mouth 2 (two) times daily.     . polyethylene glycol (MIRALAX / GLYCOLAX) packet Take 17 g by mouth at bedtime.     . tadalafil (CIALIS) 10 MG tablet Take 1-2 tablets (10-20 mg total) by mouth every three (3) days as needed for erectile dysfunction. 30 tablet 0  . tamsulosin (FLOMAX) 0.4 MG CAPS capsule Take 1 capsule (0.4 mg total) by mouth daily. 90 capsule 1   No current facility-administered medications for this visit.      REVIEW OF SYSTEMS (Negative unless checked)  Constitutional: '[]' Weight loss  '[]' Fever  '[]' Chills Cardiac: '[]' Chest pain   '[]' Chest pressure   '[]' Palpitations   '[]' Shortness of breath when laying flat   '[]' Shortness of breath at rest   '[]' Shortness of breath with exertion. Vascular:  '[x]' Pain in legs with walking   '[]' Pain in legs at rest   '[]' Pain in legs when laying flat   '[x]' Claudication   '[]' Pain in feet when walking  '[]' Pain in feet at rest  '[]' Pain in feet when laying flat   '[]' History of DVT   '[]' Phlebitis   '[]' Swelling in legs   '[]' Varicose veins   '[]' Non-healing ulcers Pulmonary:   '[]' Uses home oxygen   '[]' Productive cough   '[]' Hemoptysis   '[]' Wheeze  '[]' COPD   '[]' Asthma Neurologic:  '[]' Dizziness  '[]' Blackouts   '[]' Seizures   '[]' History of stroke   '[]' History of TIA  '[]' Aphasia   '[]' Temporary blindness   '[]' Dysphagia   '[]' Weakness or numbness in arms   '[]' Weakness or numbness in legs Musculoskeletal:  '[x]' Arthritis   '[]' Joint swelling   '[x]' Joint pain   '[x]' Low back pain Hematologic:  '[]' Easy bruising  '[]' Easy bleeding   '[]' Hypercoagulable state   '[]' Anemic  '[]' Hepatitis Gastrointestinal:  '[]' Blood in stool   '[]' Vomiting blood  '[]' Gastroesophageal reflux/heartburn   '[]' Abdominal pain Genitourinary:  '[x]' Chronic kidney disease   '[]' Difficult urination  '[]' Frequent urination  '[]' Burning with urination   '[]' Hematuria Skin:  '[]' Rashes   '[]' Ulcers    '[]' Wounds Psychological:  '[]' History of anxiety   '[]'  History of major depression.    Physical Exam BP (!) 153/81 (  BP Location: Right Arm)   Pulse 90   Resp 16   Ht '5\' 9"'  (1.753 m)   Wt 294 lb (133.4 kg)   BMI 43.42 kg/m  Gen:  WD/WN, NAD. Obese.  Head: [place]/AT, No temporalis wasting.  Ear/Nose/Throat: Hearing grossly intact, nares w/o erythema or drainage, oropharynx w/o Erythema/Exudate Eyes: Conjunctiva clear, sclera non-icteric  Neck: trachea midline.  No JVD.  Pulmonary:  Good air movement, respirations not labored, no use of accessory muscles  Cardiac: RRR, no JVD Vascular:  Vessel Right Left  Radial Palpable Palpable                          DP 1+ 1+  PT 1+ NP   Gastrointestinal:. No masses, surgical incisions, or scars. Musculoskeletal: M/S 5/5 throughout.  Extremities without ischemic changes.  No deformity or atrophy. Mild BLE edema. Walks with a cane.  Neurologic: Sensation grossly intact in extremities.  Symmetrical.  Speech is fluent. Motor exam as listed above. Psychiatric: Judgment intact, Mood & affect appropriate for pt's clinical situation. Dermatologic: No rashes or ulcers noted.  No cellulitis or open wounds.    Radiology No results found.  Labs Recent Results (from the past 2160 hour(s))  Urinalysis, Complete     Status: Abnormal   Collection Time: 12/20/19  1:38 PM  Result Value Ref Range   Specific Gravity, UA 1.025 1.005 - 1.030   pH, UA 5.5 5.0 - 7.5   Color, UA Yellow Yellow   Appearance Ur Clear Clear   Leukocytes,UA Negative Negative   Protein,UA Negative Negative/Trace   Glucose, UA 2+ (A) Negative   Ketones, UA Negative Negative   RBC, UA 1+ (A) Negative   Bilirubin, UA Negative Negative   Urobilinogen, Ur 0.2 0.2 - 1.0 mg/dL   Nitrite, UA Negative Negative   Microscopic Examination See below:   Microscopic Examination     Status: Abnormal   Collection Time: 12/20/19  1:38 PM   Urine  Result Value Ref Range   WBC, UA 0-5 0 - 5  /hpf   RBC 0-2 0 - 2 /hpf   Epithelial Cells (non renal) 0-10 0 - 10 /hpf   Renal Epithel, UA 0-10 (A) None seen /hpf   Bacteria, UA None seen None seen/Few  Bladder Scan (Post Void Residual) in office     Status: None   Collection Time: 12/20/19  1:54 PM  Result Value Ref Range   Scan Result 60   POCT HgB A1C     Status: Abnormal   Collection Time: 12/23/19 11:59 AM  Result Value Ref Range   Hemoglobin A1C 7.7 (A) 4.0 - 5.6 %   HbA1c POC (<> result, manual entry)     HbA1c, POC (prediabetic range)     HbA1c, POC (controlled diabetic range)    Lipid panel     Status: None   Collection Time: 12/23/19 12:41 PM  Result Value Ref Range   Cholesterol 124 <200 mg/dL   HDL 47 > OR = 40 mg/dL   Triglycerides 85 <150 mg/dL   LDL Cholesterol (Calc) 60 mg/dL (calc)    Comment: Reference range: <100 . Desirable range <100 mg/dL for primary prevention;   <70 mg/dL for patients with CHD or diabetic patients  with > or = 2 CHD risk factors. Marland Kitchen LDL-C is now calculated using the Martin-Hopkins  calculation, which is a validated novel method providing  better accuracy than the Friedewald equation in the  estimation of LDL-C.  Cresenciano Genre et al. Annamaria Helling. 5643;329(51): 2061-2068  (http://education.[facility].com/faq/FAQ164)    Total CHOL/HDL Ratio 2.6 <5.0 (calc)   Non-HDL Cholesterol (Calc) 77 <130 mg/dL (calc)    Comment: For patients with diabetes plus 1 major ASCVD risk  factor, treating to a non-HDL-C goal of <100 mg/dL  (LDL-C of <70 mg/dL) is considered a therapeutic  option.   Microalbumin / creatinine urine ratio     Status: None   Collection Time: 12/23/19 12:41 PM  Result Value Ref Range   Creatinine, Urine 161 20 - 320 mg/dL   Microalb, Ur 2.3 mg/dL    Comment: Reference Range Not established    Microalb Creat Ratio 14 <30 mcg/mg creat    Comment: . The ADA defines abnormalities in albumin excretion as follows: Marland Kitchen Albuminuria Category        Result (mcg/mg  creatinine) . Normal to Mildly increased   <30 Moderately increased         30-299  Severely increased           > OR = 300 . The ADA recommends that at least two of three specimens collected within a 3-6 month period be abnormal before considering a patient to be within a diagnostic category.   COMPLETE METABOLIC PANEL WITH GFR     Status: Abnormal   Collection Time: 12/23/19 12:41 PM  Result Value Ref Range   Glucose, Bld 169 (H) 65 - 99 mg/dL    Comment: .            Fasting reference interval . For someone without known diabetes, a glucose value >125 mg/dL indicates that they may have diabetes and this should be confirmed with a follow-up test. .    BUN 15 7 - 25 mg/dL   Creat 0.92 0.70 - 1.18 mg/dL    Comment: For patients >29 years of age, the reference limit for Creatinine is approximately 13% higher for people identified as African-American. .    GFR, Est Non African American 79 > OR = 60 mL/min/1.13m   GFR, Est African American 92 > OR = 60 mL/min/1.771m  BUN/Creatinine Ratio NOT APPLICABLE 6 - 22 (calc)   Sodium 137 135 - 146 mmol/L   Potassium 5.0 3.5 - 5.3 mmol/L   Chloride 103 98 - 110 mmol/L   CO2 28 20 - 32 mmol/L   Calcium 9.7 8.6 - 10.3 mg/dL   Total Protein 7.6 6.1 - 8.1 g/dL   Albumin 4.1 3.6 - 5.1 g/dL   Globulin 3.5 1.9 - 3.7 g/dL (calc)   AG Ratio 1.2 1.0 - 2.5 (calc)   Total Bilirubin 0.8 0.2 - 1.2 mg/dL   Alkaline phosphatase (APISO) 59 35 - 144 U/L   AST 20 10 - 35 U/L   ALT 15 9 - 46 U/L  CBC with Differential/Platelet     Status: None   Collection Time: 12/23/19 12:41 PM  Result Value Ref Range   WBC 4.4 3.8 - 10.8 Thousand/uL   RBC 4.82 4.20 - 5.80 Million/uL   Hemoglobin 13.8 13.2 - 17.1 g/dL   HCT 42.9 38 - 50 %   MCV 89.0 80.0 - 100.0 fL   MCH 28.6 27.0 - 33.0 pg   MCHC 32.2 32.0 - 36.0 g/dL   RDW 14.8 11.0 - 15.0 %   Platelets 214 140 - 400 Thousand/uL   MPV 11.5 7.5 - 12.5 fL   Neutro Abs 2,658 1,500 - 7,800 cells/uL    Lymphs  Abs 1,258 850 - 3,900 cells/uL   Absolute Monocytes 352 200 - 950 cells/uL   Eosinophils Absolute 101 15.0 - 500.0 cells/uL   Basophils Absolute 31 0.0 - 200.0 cells/uL   Neutrophils Relative % 60.4 %   Total Lymphocyte 28.6 %   Monocytes Relative 8.0 %   Eosinophils Relative 2.3 %   Basophils Relative 0.7 %  VITAMIN D 25 Hydroxy (Vit-D Deficiency, Fractures)     Status: None   Collection Time: 12/23/19 12:41 PM  Result Value Ref Range   Vit D, 25-Hydroxy 76 30 - 100 ng/mL    Comment: Vitamin D Status         25-OH Vitamin D: . Deficiency:                    <20 ng/mL Insufficiency:             20 - 29 ng/mL Optimal:                 > or = 30 ng/mL . For 25-OH Vitamin D testing on patients on  D2-supplementation and patients for whom quantitation  of D2 and D3 fractions is required, the [facility](TM) 25-OH VIT D, (D2,D3), LC/MS/MS is recommended: order  code [phone] (patients >61yr). See Note 1 . Note 1 . For additional information, please refer to  http://education.[facility].com/faq/FAQ199  (This link is being provided for informational/ educational purposes only.)   Iron, TIBC and Ferritin Panel     Status: None   Collection Time: 12/23/19 12:41 PM  Result Value Ref Range   Iron 80 50 - 180 mcg/dL   TIBC 356 250 - 425 mcg/dL (calc)   %SAT 22 20 - 48 % (calc)   Ferritin 44 24 - 380 ng/mL  PSA     Status: None   Collection Time: 12/23/19 12:41 PM  Result Value Ref Range   PSA 1.68 < OR = 4.0 ng/mL    Comment: The total PSA value from this assay system is  standardized against the WHO standard. The test  result will be approximately 20% lower when compared  to the equimolar-standardized total PSA (Beckman  Coulter). Comparison of serial PSA results should be  interpreted with this fact in mind. . This test was performed using the Siemens  chemiluminescent method. Values obtained from  different assay methods cannot be used interchangeably. PSA  levels, regardless of value, should not be interpreted as absolute evidence of the presence or absence of disease.   Uric acid     Status: None   Collection Time: 12/23/19 12:41 PM  Result Value Ref Range   Uric Acid, Serum 4.3 4.0 - 8.0 mg/dL    Comment: Therapeutic target for gout patients: <6.0 mg/dL .     Assessment/Plan:  Claudication ([facility]  Recommend:  The patient has atypical pain symptoms for pure atherosclerotic disease. However, on physical exam there is evidence of mixed venous and arterial disease, given the diminished pulses and the edema associated with venous changes of the legs.  Noninvasive studies including ABI's and venous ultrasound of the legs will be obtained and the patient will follow up with me to review these studies.  If the patient's vascular analysis is unrevealing, then I suspect the patient is c/o pseudoclaudication.  Patient should have an evaluation of his LS spine which I defer to the primary service.  The patient should continue walking and begin a more formal exercise program. The patient should continue his antiplatelet therapy and  aggressive treatment of the lipid abnormalities.  The patient should begin wearing graduated compression socks 15-20 mmHg strength to control edema.   Essential hypertension blood pressure control important in reducing the progression of atherosclerotic disease. On appropriate oral medications.   Controlled type 2 diabetes mellitus with microalbuminuria (HCC) blood glucose control important in reducing the progression of atherosclerotic disease. Also, involved in wound healing. On appropriate medications.   Hyperlipidemia lipid control important in reducing the progression of atherosclerotic disease. Continue statin therapy   Swelling of limb Compression and elevation can help control the swelling.  Venous reflux study to be done in the near future at his convenience.      Leotis Pain 01/17/2020, 12:09  PM   This note was created with Dragon medical transcription system.  Any errors from dictation are unintentional.

## 2020-01-17 NOTE — Patient Instructions (Signed)

## 2020-01-17 NOTE — Assessment & Plan Note (Signed)
blood pressure control important in reducing the progression of atherosclerotic disease. On appropriate oral medications.  

## 2020-01-17 NOTE — Assessment & Plan Note (Signed)
Recommend:  The patient has atypical pain symptoms for pure atherosclerotic disease. However, on physical exam there is evidence of mixed venous and arterial disease, given the diminished pulses and the edema associated with venous changes of the legs.  Noninvasive studies including ABI's and venous ultrasound of the legs will be obtained and the patient will follow up with me to review these studies.  If the patient's vascular analysis is unrevealing, then I suspect the patient is c/o pseudoclaudication.  Patient should have an evaluation of his LS spine which I defer to the primary service.  The patient should continue walking and begin a more formal exercise program. The patient should continue his antiplatelet therapy and aggressive treatment of the lipid abnormalities.  The patient should begin wearing graduated compression socks 15-20 mmHg strength to control edema.

## 2020-01-17 NOTE — Assessment & Plan Note (Signed)
lipid control important in reducing the progression of atherosclerotic disease. Continue statin therapy  

## 2020-01-19 DIAGNOSIS — R2689 Other abnormalities of gait and mobility: Secondary | ICD-10-CM | POA: Diagnosis not present

## 2020-01-23 NOTE — Progress Notes (Signed)
Cardiology Office Note  Date:  01/24/2020   ID:  Jordan [name], DOB 20-Jul-1940, MRN [id number]  PCP:  Jordan Sizer, MD   Chief Complaint  Patient presents with  . office visit    4 month F/U-No new cardiac concerns; Meds verbally reviewed with patient.     HPI:  Jordan [name] is a 79 year old gentleman with past medical history of Diabetes Smoked, age 80, 58 years Morbid obesity ARB/ACE causing angioedema Essential hypertension Hyperlipidemia Aortic valve stenosis, last evaluated 2014 Who presents for f/u of his aortic valve stenosis    Echo 08/2018, results discussed Severe calcifcation of the  aortic valve. Moderate stenosis of the aortic valve. Mean gradient 26 mm  Hg, peak gradient 45 mm Hg, Peak velocity 3.34 m/sec   On his prior office visit we will order repeat echocardiogram, this was not completed  Denies significant chest pain or shortness of breath on exertion  Weight continues to run high  Lab work reviewed with him in detail Total chol 124 LDL 60 HBA1C 7.7 CMP good  Uses a cane, Legs get "heavy" Going to vascular  [place] Denies any chest pain or shortness of breath on exertion No edema  BP with PMD last month was good/low Elevated on today's visit, was rushing   PMH:   has a past medical history of Aortic stenosis, Chronic kidney disease, stage II (mild), Decreased libido, Diabetes mellitus without complication ([place]), Glaucoma, Gout (2009), Hernia (0867,6195), Hyperlipidemia, Hypertension, Inguinal hernia without mention of obstruction or gangrene, unilateral or unspecified, (not specified as recurrent), Lumbago, Obesity, and Unspecified constipation.  PSH:    Past Surgical History:  Procedure Laterality Date  . cataract surgery    . COLONOSCOPY  2012  . COLONOSCOPY N/A 04/22/2019   Procedure: COLONOSCOPY;  Surgeon: Mansouraty, Telford Nab., MD;  Location: [facility];  Service: Gastroenterology;   Laterality: N/A;  . EYE SURGERY  2011   cataract  . UMBILICAL HERNIA REPAIR  2011    Current Outpatient Medications  Medication Sig Dispense Refill  . ACCU-CHEK FASTCLIX LANCETS MISC 1 each by Subdermal route 2 (two) times daily. 102 each 2  . allopurinol (ZYLOPRIM) 300 MG tablet Take 1 tablet (300 mg total) by mouth daily. 90 tablet 1  . amLODipine (NORVASC) 10 MG tablet Take 1 tablet (10 mg total) by mouth daily. 90 tablet 1  . aspirin EC 81 MG tablet Take 1 tablet (81 mg total) by mouth daily. Swallow whole.    Marland Kitchen atorvastatin (LIPITOR) 40 MG tablet Take 1 tablet (40 mg total) by mouth daily. 90 tablet 1  . Blood Glucose Monitoring Suppl (ACCU-CHEK AVIVA PLUS) w/Device KIT 1 each by Does not apply route 2 (two) times daily. Use as directed 100 kit 12  . glipiZIDE (GLUCOTROL XL) 5 MG 24 hr tablet Take 1 tablet (5 mg total) by mouth daily with breakfast. 90 tablet 1  . glucose blood (ACCU-CHEK AVIVA PLUS) test strip 1 each by Other route 2 (two) times daily as needed for other. Use as instructed 200 strip 2  . latanoprost (XALATAN) 0.005 % ophthalmic solution Place 1 drop into the left eye at bedtime.     . metFORMIN (GLUCOPHAGE-XR) 750 MG 24 hr tablet Take 2 tablets (1,500 mg total) by mouth daily with breakfast. 180 tablet 1  . niacin 100 MG tablet Take 100 mg by mouth at bedtime.    . Omega-3 Fatty Acids (FISH OIL PO) Take 1 capsule  by mouth 2 (two) times daily.     . polyethylene glycol (MIRALAX / GLYCOLAX) packet Take 17 g by mouth at bedtime.     . tadalafil (CIALIS) 10 MG tablet Take 1-2 tablets (10-20 mg total) by mouth every three (3) days as needed for erectile dysfunction. 30 tablet 0  . tamsulosin (FLOMAX) 0.4 MG CAPS capsule Take 1 capsule (0.4 mg total) by mouth daily. 90 capsule 1   No current facility-administered medications for this visit.     Allergies:   Ace inhibitors   Social History:  The patient  reports that he quit smoking about 44 years ago. His smoking use  included cigarettes. He started smoking about 54 years ago. He has a 10.00 pack-year smoking history. He has never used smokeless tobacco. He reports current alcohol use of about 1.0 standard drink of alcohol per week. He reports that he does not use drugs.   Family History:   family history includes Diabetes in his brother, sister, and sister; Heart disease in his father; Hypertension in his father; Prostate cancer in his brother.    Review of Systems: Review of Systems  Constitutional: Negative.        Weight gain  Respiratory: Negative.   Cardiovascular: Negative.   Gastrointestinal: Negative.   Musculoskeletal: Negative.   Neurological: Negative.   Psychiatric/Behavioral: Negative.   All other systems reviewed and are negative.    PHYSICAL EXAM: VS:  BP (!) 150/64 (BP Location: Right Arm, Patient Position: Sitting, Cuff Size: Large)   Pulse (!) 102   Ht _0  (1.753 m)   Wt 299 lb (135.6 kg)   SpO2 98%   BMI 44.15 kg/m  , BMI Body mass index is 44.15 kg/m. Constitutional:  oriented to person, place, and time. No distress.  Obese, sitting in a wheelchair HENT:  Head: Grossly normal Eyes:  no discharge. No scleral icterus.  Neck: No JVD, no carotid bruits  Cardiovascular: Regular rate and rhythm, 3/6 SEM RSB, Pulmonary/Chest: Clear to auscultation bilaterally, no wheezes or rails Abdominal: Soft.  no distension.  no tenderness.  Musculoskeletal: Normal range of motion Neurological:  normal muscle tone. Coordination normal. No atrophy Skin: Skin warm and dry Psychiatric: normal affect, pleasant  Recent Labs: 09/23/2019: Magnesium 1.7 12/23/2019: ALT 15; BUN 15; Creat 0.92; Hemoglobin 13.8; Platelets 214; Potassium 5.0; Sodium 137    Lipid Panel Lab Results  Component Value Date   CHOL 124 12/23/2019   HDL 47 12/23/2019   LDLCALC 60 12/23/2019   TRIG 85 12/23/2019      Wt Readings from Last 3 Encounters:  01/24/20 299 lb (135.6 kg)  01/17/20 294 lb (133.4 kg)   12/23/19 296 lb 14.4 oz (134.7 kg)      ASSESSMENT AND PLAN:  Essential hypertension - Plan: EKG 12-Lead Elevated today but well controlled last month with primary care Recommend he track his blood pressure at home We will follow for now  Aortic valve stenosis, etiology of cardiac valve disease unspecified - Moderate aortic valve stenosis June 2020 Repeat echocardiogram ordered Denies shortness of breath chest tightness on exertion  Mixed hyperlipidemia Cholesterol is at goal on the current lipid regimen. No changes to the medications were made.  Controlled type 2 diabetes mellitus with microalbuminuria, without long-term current use of insulin (HCC) Hemoglobin A1c 7.7 Stressed importance of watching his carbohydrates Numbers higher than prior year  Obesity, Class III, BMI 40-49.9 (morbid obesity) ([place]) Recommend he continue his walking program, calorie restriction  Total encounter time more than 25 minutes  Greater than 50% was spent in counseling and coordination of care with the patient    Orders Placed This Encounter  Procedures  . ECHOCARDIOGRAM COMPLETE     Signed, Esmond Plants, M.D., Ph.D. 01/24/2020  [place], [place]

## 2020-01-24 ENCOUNTER — Other Ambulatory Visit: Payer: Self-pay

## 2020-01-24 ENCOUNTER — Encounter: Payer: Self-pay | Admitting: Cardiovascular Disease

## 2020-01-24 ENCOUNTER — Ambulatory Visit: Payer: Medicare HMO | Admitting: Cardiovascular Disease

## 2020-01-24 VITALS — BP 150/64 | HR 102 | Ht 69.0 in | Wt 299.0 lb

## 2020-01-24 DIAGNOSIS — E782 Mixed hyperlipidemia: Secondary | ICD-10-CM

## 2020-01-24 DIAGNOSIS — I10 Essential (primary) hypertension: Secondary | ICD-10-CM | POA: Diagnosis not present

## 2020-01-24 DIAGNOSIS — I700 Atherosclerosis of aorta: Secondary | ICD-10-CM | POA: Diagnosis not present

## 2020-01-24 DIAGNOSIS — I350 Nonrheumatic aortic (valve) stenosis: Secondary | ICD-10-CM

## 2020-01-24 DIAGNOSIS — I77819 Aortic ectasia, unspecified site: Secondary | ICD-10-CM | POA: Diagnosis not present

## 2020-01-24 NOTE — Patient Instructions (Addendum)
Thank you for allowing the Chronic Care Management team to participate in your care.   Goals Addressed            This Visit's Progress   . Chronic Disease Management       CARE PLAN ENTRY (see longitudinal plan of care for additional care plan information)  Current Barriers:  . Chronic Disease Management support and education needs related to Hypertension, Diabetes, Chronic Bronchitis, HLD and CKD.   Case Manager Clinical Goal(s):  Over the next 120 days, patient will: . Attend all medical appointments as scheduled. . Take all medications as prescribed.  . Monitor blood pressure and record readings. . Consistently monitor fasting blood glucose and maintain a log. . Follow safety measures to prevent falls and injuries.  Interventions:  . Inter-disciplinary care team collaboration (see longitudinal plan of care) . Discussed medications and blood glucose readings. Reports taking medications as prescribed. He is attempting to consistently monitor his blood glucose levels. Reports recent fasting readings have been elevated with a range of 140 to 190. FBS today was 154 mg/dl. Admits that elevated readings are likely due to dietary choices. Discussed nutritional intake and food options to improve compliance with a diabetic diet. Encouraged to continue monitoring and recording readings.    . Reviewed safety measures and discussed changes in activity level. Reports using recommended safety precautions. Denies falls. Reports that his activity tolerance has slightly declined. He still attempts to walk daily but for shorter lengths of time. Reports he may require a disability placard. Denies falls. Will update team or leave a message for his provider regarding possible need for a rollator walker.   . Discussed plans for care management and follow up. Denies urgent concerns or care management needs. Will plan to outreach again in two months.   Patient Self Care Activities:  . Self administers  medications . Attends scheduled provider appointments . Calls pharmacy for medication refills . Performs ADL's independently . Performs IADL's independently   Please see past updates related to this goal by clicking on the "Past Updates" button in the selected goal        Jordan [name] verbalized understanding of the information discussed during the telephonic outreach today. Declined need for a mailed/printed copy of the instructions.    A member of the care management team will follow up with Jordan [name] in two months.    [facility] [phone]

## 2020-01-24 NOTE — Patient Instructions (Addendum)
Medication Instructions:  No changes  If you need a refill on your cardiac medications before your next appointment, please call your pharmacy.    Lab work: No new labs needed   If you have labs (blood work) drawn today and your tests are completely normal, you will receive your results only by: Marland Kitchen MyChart Message (if you have MyChart) OR . A paper copy in the mail If you have any lab test that is abnormal or we need to change your treatment, we will call you to review the results.   Testing/Procedures: Echo for aortic valve stenosis Your physician has requested that you have an echocardiogram. Echocardiography is a painless test that uses sound waves to create images of your heart. It provides your doctor with information about the size and shape of your heart and how well your heart's chambers and valves are working. This procedure takes approximately one hour. There are no restrictions for this procedure.     Follow-Up: At [facility], you and your health needs are our priority.  As part of our continuing mission to provide you with exceptional heart care, we have created designated Provider Care Teams.  These Care Teams include your primary Cardiologist (physician) and Advanced Practice Providers (APPs -  Physician Assistants and Nurse Practitioners) who all work together to provide you with the care you need, when you need it.  . You will need a follow up appointment in 12 months  . Providers on your designated Care Team:   . Murray Hodgkins, NP . Christell Faith, PA-C . Marrianne Mood, PA-C  Any Other Special Instructions Will Be Listed Below (If Applicable).  COVID-19 Vaccine Information can be found at: [email or web address].uk For questions related to vaccine distribution or appointments, please email [email or web address] or call [phone].

## 2020-01-27 ENCOUNTER — Ambulatory Visit (INDEPENDENT_AMBULATORY_CARE_PROVIDER_SITE_OTHER): Payer: Medicare HMO

## 2020-01-27 ENCOUNTER — Encounter (INDEPENDENT_AMBULATORY_CARE_PROVIDER_SITE_OTHER): Payer: Self-pay | Admitting: Nurse Practitioner

## 2020-01-27 ENCOUNTER — Other Ambulatory Visit: Payer: Self-pay

## 2020-01-27 ENCOUNTER — Ambulatory Visit (INDEPENDENT_AMBULATORY_CARE_PROVIDER_SITE_OTHER): Payer: Medicare HMO | Admitting: Nurse Practitioner

## 2020-01-27 VITALS — BP 130/71 | HR 85 | Resp 16 | Wt 296.0 lb

## 2020-01-27 DIAGNOSIS — I739 Peripheral vascular disease, unspecified: Secondary | ICD-10-CM | POA: Diagnosis not present

## 2020-01-27 DIAGNOSIS — R809 Proteinuria, unspecified: Secondary | ICD-10-CM

## 2020-01-27 DIAGNOSIS — M7989 Other specified soft tissue disorders: Secondary | ICD-10-CM | POA: Diagnosis not present

## 2020-01-27 DIAGNOSIS — I872 Venous insufficiency (chronic) (peripheral): Secondary | ICD-10-CM | POA: Diagnosis not present

## 2020-01-27 DIAGNOSIS — E782 Mixed hyperlipidemia: Secondary | ICD-10-CM

## 2020-01-27 DIAGNOSIS — I10 Essential (primary) hypertension: Secondary | ICD-10-CM | POA: Diagnosis not present

## 2020-01-27 DIAGNOSIS — E1129 Type 2 diabetes mellitus with other diabetic kidney complication: Secondary | ICD-10-CM

## 2020-01-29 ENCOUNTER — Encounter (INDEPENDENT_AMBULATORY_CARE_PROVIDER_SITE_OTHER): Payer: Self-pay | Admitting: Nurse Practitioner

## 2020-01-29 NOTE — Progress Notes (Signed)
Subjective:    Patient ID: Jordan [name], male    DOB: 03-Jan-1941, 79 y.o.   MRN: [id number] Chief Complaint  Patient presents with  . Follow-up    ultrasound follow up    Jordan [name] is a 79 y.o. male. The patient returns today for noninvasive studies related to lower extremity leg pain and claudication.  The patient notes that over the last year or so his legs get heavy and get tired every time he walks any significant distance.  Patient was previously able to walk for about an hour or so at a time without his legs hurting now he can walk less than half of that before his legs begin to hurt.  The pain mostly affects his calves and it is about the same bilaterally.  He notes that walking up inclines makes the pain occur sooner.  Denies any open wounds or ulcerations.  He denies any rest pain like symptoms.  Today noninvasive studies show an ABI 1.03 on the right and 0.92 on the left.  The patient has triphasic tibial artery waveforms with good toe waveforms bilaterally.  The patient has reflux in his right common femoral vein as well as the right great saphenous vein at the proximal thigh.  The left lower extremity has reflux at the great saphenous vein at the saphenofemoral junction extending to the proximal thigh.  No evidence of DVT or superficial venous thrombosis bilaterally.  The right lower extremity has a Baker's cyst measuring 3.21 cm x 1.06 cm x 2.66 cm.  The left lower extremity has a Baker's cyst measuring 4.62 cm x 2.74 cm x 4.42 cm.     Review of Systems  Cardiovascular: Positive for leg swelling.       Claudication  Musculoskeletal: Positive for arthralgias and myalgias.  Neurological: Positive for weakness.       Objective:   Physical Exam Vitals reviewed.  HENT:     Head: Normocephalic.  Cardiovascular:     Rate and Rhythm: Normal rate and regular rhythm.     Pulses:          Dorsalis pedis pulses are 1+ on the right side and 1+ on the left side.        Posterior tibial pulses are 1+ on the right side and detected w/ Doppler on the left side.  Pulmonary:     Effort: Pulmonary effort is normal.  Musculoskeletal:     Right lower leg: 1+ Edema present.     Left lower leg: 1+ Edema present.  Neurological:     Mental Status: He is alert and oriented to person, place, and time.     Motor: Weakness present.  Psychiatric:        Mood and Affect: Mood normal.        Behavior: Behavior normal.        Thought Content: Thought content normal.        Judgment: Judgment normal.     BP 130/71 (BP Location: Right Arm)   Pulse 85   Resp 16   Wt 296 lb (134.3 kg)   BMI 43.71 kg/m   Past Medical History:  Diagnosis Date  . Aortic stenosis   . Chronic kidney disease, stage II (mild)   . Decreased libido   . Diabetes mellitus without complication ([place])   . Glaucoma   . Gout 2009  . Hernia 5809,9833  . Hyperlipidemia   . Hypertension   . Inguinal hernia without mention of obstruction  or gangrene, unilateral or unspecified, (not specified as recurrent)   . Lumbago   . Obesity   . Unspecified constipation     Social History   Socioeconomic History  . Marital status: Widowed    Spouse name: Not on file  . Number of children: 2  . Years of education: Not on file  . Highest education level: Some college, no degree  Occupational History  . Occupation: retired     Comment: used to [employer]   Tobacco Use  . Smoking status: Former Smoker    Packs/day: 1.00    Years: 10.00    Pack years: 10.00    Types: Cigarettes    Start date: 03/17/1965    Quit date: 11/27/1975    Years since quitting: 44.2  . Smokeless tobacco: Never Used  . Tobacco comment: quit 40 years  Vaping Use  . Vaping Use: Never used  Substance and Sexual Activity  . Alcohol use: Yes    Alcohol/week: 1.0 standard drink    Types: 1 Standard drinks or equivalent per week    Comment: socially - 1 x year  . Drug use: No  . Sexual activity: Yes     Partners: Female  Other Topics Concern  . Not on file  Social History Narrative   Lives by himself in [place]   Two grown children, daughter in [place] and son in [place]   He goes to [place] to visit his sisters often    Social Determinants of Health   Financial Resource Strain: Low Risk   . Difficulty of Paying Living Expenses: Not very hard  Food Insecurity: No Food Insecurity  . Worried About Charity fundraiser in the Last Year: Never true  . Ran Out of Food in the Last Year: Never true  Transportation Needs: No Transportation Needs  . Lack of Transportation (Medical): No  . Lack of Transportation (Non-Medical): No  Physical Activity: Sufficiently Active  . Days of Exercise per Week: 5 days  . Minutes of Exercise per Session: 30 min  Stress: No Stress Concern Present  . Feeling of Stress : Not at all  Social Connections: Moderately Integrated  . Frequency of Communication with Friends and Family: More than three times a week  . Frequency of Social Gatherings with Friends and Family: More than three times a week  . Attends Religious Services: More than 4 times per year  . Active Member of Clubs or Organizations: Yes  . Attends Archivist Meetings: More than 4 times per year  . Marital Status: Widowed  Intimate Partner Violence: Not At Risk  . Fear of Current or Ex-Partner: No  . Emotionally Abused: No  . Physically Abused: No  . Sexually Abused: No    Past Surgical History:  Procedure Laterality Date  . cataract surgery    . COLONOSCOPY  2012  . COLONOSCOPY N/A 04/22/2019   Procedure: COLONOSCOPY;  Surgeon: Mansouraty, Telford Nab., MD;  Location: [facility];  Service: Gastroenterology;  Laterality: N/A;  . EYE SURGERY  2011   cataract  . UMBILICAL HERNIA REPAIR  2011    Family History  Problem Relation Age of Onset  . Heart disease Father   . Hypertension Father   . Diabetes Sister   . Diabetes Brother   . Prostate cancer Brother   . Diabetes  Sister   . Kidney disease Neg Hx   . Kidney cancer Neg Hx   . Bladder Cancer Neg Hx   .  Colon cancer Neg Hx   . Esophageal cancer Neg Hx   . Inflammatory bowel disease Neg Hx   . Liver disease Neg Hx   . Pancreatic cancer Neg Hx   . Rectal cancer Neg Hx   . Stomach cancer Neg Hx     Allergies  Allergen Reactions  . Ace Inhibitors Other (See Comments)    angioedema    CBC Latest Ref Rng & Units 12/23/2019 05/03/2019 04/22/2019  WBC 3.8 - 10.8 Thousand/uL 4.4 6.1 -  Hemoglobin 13.2 - 17.1 g/dL 13.8 11.7(L) 10.2(L)  Hematocrit 38 - 50 % 42.9 35.2(L) 32.0(L)  Platelets 140 - 400 Thousand/uL 214 250 -      CMP     Component Value Date/Time   NA 137 12/23/2019 1241   NA 138 09/23/2019 1427   K 5.0 12/23/2019 1241   CL 103 12/23/2019 1241   CO2 28 12/23/2019 1241   GLUCOSE 169 (H) 12/23/2019 1241   BUN 15 12/23/2019 1241   BUN 14 09/23/2019 1427   CREATININE 0.92 12/23/2019 1241   CALCIUM 9.7 12/23/2019 1241   PROT 7.6 12/23/2019 1241   PROT 7.8 08/27/2015 1158   ALBUMIN 3.6 04/21/2019 0543   ALBUMIN 3.9 08/27/2015 1158   AST 20 12/23/2019 1241   ALT 15 12/23/2019 1241   ALKPHOS 47 04/21/2019 0543   BILITOT 0.8 12/23/2019 1241   BILITOT 0.3 08/27/2015 1158   GFRNONAA 79 12/23/2019 1241   GFRAA 92 12/23/2019 1241     No results found.     Assessment & Plan:   1. Claudication [facility]) The patient's noninvasive studies today do not indicate significant peripheral arterial disease however further review of patient's recent CT of his abdomen pelvis in February of some indications of atherosclerotic disease within the iliac arteries however because this was not an angiogram his heart return to the level of disease.  Therefore we will have the patient return with an aortoiliac duplex to evaluate for possible stenosis within his iliac arteries.  However if body habitus makes it difficult to visualize iliac arteries via duplex we will obtain ABIs with toe lifts instead. - VAS  US AORTA/IVC/ILIACS; Future  2. Essential hypertension Continue antihypertensive medications as already ordered, these medications have been reviewed and there are no changes at this time.   3. Mixed hyperlipidemia Continue statin as ordered and reviewed, no changes at this time   4. Controlled type 2 diabetes mellitus with microalbuminuria, without long-term current use of insulin (HCC) Continue hypoglycemic medications as already ordered, these medications have been reviewed and there are no changes at this time.  Hgb A1C to be monitored as already arranged by primary service   5. Chronic venous insufficiency No surgery or intervention at this point in time.    I have had a long discussion with the patient regarding venous insufficiency and why it  causes symptoms. I have discussed with the patient the chronic skin changes that accompany venous insufficiency and the long term sequela such as infection and ulceration.  Patient will begin wearing graduated compression stockings class 1 (20-30 mmHg) or compression wraps on a daily basis a prescription was given. The patient will put the stockings on first thing in the morning and removing them in the evening. The patient is instructed specifically not to sleep in the stockings.    In addition, behavioral modification including several periods of elevation of the lower extremities during the day will be continued. I have demonstrated that proper elevation is  a position with the ankles at heart level.  The patient is instructed to begin routine exercise, especially walking on a daily basis  Patient should undergo duplex ultrasound of the venous system to ensure that DVT or reflux is not present.  Following the review of the ultrasound the patient will follow up in 2-3 months to reassess the degree of swelling and the control that graduated compression stockings or compression wraps  is offering.      Current Outpatient Medications on File  Prior to Visit  Medication Sig Dispense Refill  . ACCU-CHEK FASTCLIX LANCETS MISC 1 each by Subdermal route 2 (two) times daily. 102 each 2  . allopurinol (ZYLOPRIM) 300 MG tablet Take 1 tablet (300 mg total) by mouth daily. 90 tablet 1  . amLODipine (NORVASC) 10 MG tablet Take 1 tablet (10 mg total) by mouth daily. 90 tablet 1  . aspirin EC 81 MG tablet Take 1 tablet (81 mg total) by mouth daily. Swallow whole.    Marland Kitchen atorvastatin (LIPITOR) 40 MG tablet Take 1 tablet (40 mg total) by mouth daily. 90 tablet 1  . Blood Glucose Monitoring Suppl (ACCU-CHEK AVIVA PLUS) w/Device KIT 1 each by Does not apply route 2 (two) times daily. Use as directed 100 kit 12  . glipiZIDE (GLUCOTROL XL) 5 MG 24 hr tablet Take 1 tablet (5 mg total) by mouth daily with breakfast. 90 tablet 1  . glucose blood (ACCU-CHEK AVIVA PLUS) test strip 1 each by Other route 2 (two) times daily as needed for other. Use as instructed 200 strip 2  . latanoprost (XALATAN) 0.005 % ophthalmic solution Place 1 drop into the left eye at bedtime.     . metFORMIN (GLUCOPHAGE-XR) 750 MG 24 hr tablet Take 2 tablets (1,500 mg total) by mouth daily with breakfast. 180 tablet 1  . niacin 100 MG tablet Take 100 mg by mouth at bedtime.    . Omega-3 Fatty Acids (FISH OIL PO) Take 1 capsule by mouth 2 (two) times daily.     . polyethylene glycol (MIRALAX / GLYCOLAX) packet Take 17 g by mouth at bedtime.     . tadalafil (CIALIS) 10 MG tablet Take 1-2 tablets (10-20 mg total) by mouth every three (3) days as needed for erectile dysfunction. 30 tablet 0  . tamsulosin (FLOMAX) 0.4 MG CAPS capsule Take 1 capsule (0.4 mg total) by mouth daily. 90 capsule 1   No current facility-administered medications on file prior to visit.    There are no Patient Instructions on file for this visit. No follow-ups on file.   Kris Hartmann, NP

## 2020-02-15 ENCOUNTER — Ambulatory Visit (INDEPENDENT_AMBULATORY_CARE_PROVIDER_SITE_OTHER): Payer: Medicare HMO

## 2020-02-15 ENCOUNTER — Ambulatory Visit (INDEPENDENT_AMBULATORY_CARE_PROVIDER_SITE_OTHER): Payer: Medicare HMO | Admitting: Nurse Practitioner

## 2020-02-15 ENCOUNTER — Other Ambulatory Visit (INDEPENDENT_AMBULATORY_CARE_PROVIDER_SITE_OTHER): Payer: Self-pay | Admitting: Nurse Practitioner

## 2020-02-15 ENCOUNTER — Encounter (INDEPENDENT_AMBULATORY_CARE_PROVIDER_SITE_OTHER): Payer: Self-pay | Admitting: Nurse Practitioner

## 2020-02-15 ENCOUNTER — Other Ambulatory Visit: Payer: Self-pay

## 2020-02-15 VITALS — BP 131/67 | HR 81 | Resp 16 | Wt 296.8 lb

## 2020-02-15 DIAGNOSIS — I739 Peripheral vascular disease, unspecified: Secondary | ICD-10-CM

## 2020-02-15 DIAGNOSIS — E782 Mixed hyperlipidemia: Secondary | ICD-10-CM | POA: Diagnosis not present

## 2020-02-15 DIAGNOSIS — I10 Essential (primary) hypertension: Secondary | ICD-10-CM

## 2020-02-20 ENCOUNTER — Encounter (INDEPENDENT_AMBULATORY_CARE_PROVIDER_SITE_OTHER): Payer: Self-pay | Admitting: Nurse Practitioner

## 2020-02-20 NOTE — Progress Notes (Signed)
Subjective:    Patient ID: Jordan [name], male    DOB: 11-10-1940, 79 y.o.   MRN: [id number] Chief Complaint  Patient presents with  . Follow-up    ultrasound follow up    Jordan [name] is a 79 y.o. male. The patient returns today for noninvasive studies related to lower extremity leg pain and claudication.  The patient notes that over the last year or so his legs get heavy and get tired every time he walks any significant distance.  Patient was previously able to walk for about an hour or so at a time without his legs hurting now he can walk less than half of that before his legs begin to hurt.  The pain mostly affects his calves and it is about the same bilaterally.  He notes that walking up inclines makes the pain occur sooner.  Denies any open wounds or ulcerations.  He denies any rest pain like symptoms. Patient with previous noninvasive studies which showed mild PAD however previous noninvasive studies were suggestive of possible aortoiliac disease. In order to assess for fixed lesion the patient did ABIs with toe lifts determine if there was a drastic change in his perfusion post exercise.  The right lower extremity had an ABI of 1.16 with a left of 1.01. Post exercise the pressures increased indicative of no significant decrease in perfusion. The patient had biphasic tibial artery waveforms throughout.   Review of Systems  Cardiovascular: Positive for leg swelling.  Musculoskeletal: Positive for arthralgias and gait problem.  All other systems reviewed and are negative.      Objective:   Physical Exam Vitals reviewed.  HENT:     Head: Normocephalic.  Cardiovascular:     Rate and Rhythm: Normal rate.     Pulses: Decreased pulses.  Pulmonary:     Effort: Pulmonary effort is normal.  Neurological:     Mental Status: He is alert and oriented to person, place, and time.     Motor: Weakness present.     Gait: Gait abnormal.  Psychiatric:        Mood and Affect: Mood  normal.        Behavior: Behavior normal.        Thought Content: Thought content normal.        Judgment: Judgment normal.     BP 131/67 (BP Location: Right Arm)   Pulse 81   Resp 16   Wt 296 lb 12.8 oz (134.6 kg)   BMI 43.83 kg/m   Past Medical History:  Diagnosis Date  . Aortic stenosis   . Chronic kidney disease, stage II (mild)   . Decreased libido   . Diabetes mellitus without complication ([place])   . Glaucoma   . Gout 2009  . Hernia 0277,4128  . Hyperlipidemia   . Hypertension   . Inguinal hernia without mention of obstruction or gangrene, unilateral or unspecified, (not specified as recurrent)   . Lumbago   . Obesity   . Unspecified constipation     Social History   Socioeconomic History  . Marital status: Widowed    Spouse name: Not on file  . Number of children: 2  . Years of education: Not on file  . Highest education level: Some college, no degree  Occupational History  . Occupation: retired     Comment: used to [employer]   Tobacco Use  . Smoking status: Former Smoker    Packs/day: 1.00    Years: 10.00  Pack years: 10.00    Types: Cigarettes    Start date: 03/17/1965    Quit date: 11/27/1975    Years since quitting: 44.2  . Smokeless tobacco: Never Used  . Tobacco comment: quit 40 years  Vaping Use  . Vaping Use: Never used  Substance and Sexual Activity  . Alcohol use: Yes    Alcohol/week: 1.0 standard drink    Types: 1 Standard drinks or equivalent per week    Comment: socially - 1 x year  . Drug use: No  . Sexual activity: Yes    Partners: Female  Other Topics Concern  . Not on file  Social History Narrative   Lives by himself in [place]   Two grown children, daughter in [place] and son in [place]   He goes to [place] to visit his sisters often    Social Determinants of Health   Financial Resource Strain: Low Risk   . Difficulty of Paying Living Expenses: Not very hard  Food Insecurity: No Food Insecurity  .  Worried About Charity fundraiser in the Last Year: Never true  . Ran Out of Food in the Last Year: Never true  Transportation Needs: No Transportation Needs  . Lack of Transportation (Medical): No  . Lack of Transportation (Non-Medical): No  Physical Activity: Sufficiently Active  . Days of Exercise per Week: 5 days  . Minutes of Exercise per Session: 30 min  Stress: No Stress Concern Present  . Feeling of Stress : Not at all  Social Connections: Moderately Integrated  . Frequency of Communication with Friends and Family: More than three times a week  . Frequency of Social Gatherings with Friends and Family: More than three times a week  . Attends Religious Services: More than 4 times per year  . Active Member of Clubs or Organizations: Yes  . Attends Archivist Meetings: More than 4 times per year  . Marital Status: Widowed  Intimate Partner Violence: Not At Risk  . Fear of Current or Ex-Partner: No  . Emotionally Abused: No  . Physically Abused: No  . Sexually Abused: No    Past Surgical History:  Procedure Laterality Date  . cataract surgery    . COLONOSCOPY  2012  . COLONOSCOPY N/A 04/22/2019   Procedure: COLONOSCOPY;  Surgeon: Mansouraty, Telford Nab., MD;  Location: [facility];  Service: Gastroenterology;  Laterality: N/A;  . EYE SURGERY  2011   cataract  . UMBILICAL HERNIA REPAIR  2011    Family History  Problem Relation Age of Onset  . Heart disease Father   . Hypertension Father   . Diabetes Sister   . Diabetes Brother   . Prostate cancer Brother   . Diabetes Sister   . Kidney disease Neg Hx   . Kidney cancer Neg Hx   . Bladder Cancer Neg Hx   . Colon cancer Neg Hx   . Esophageal cancer Neg Hx   . Inflammatory bowel disease Neg Hx   . Liver disease Neg Hx   . Pancreatic cancer Neg Hx   . Rectal cancer Neg Hx   . Stomach cancer Neg Hx     Allergies  Allergen Reactions  . Ace Inhibitors Other (See Comments)    angioedema    CBC Latest Ref  Rng & Units 12/23/2019 05/03/2019 04/22/2019  WBC 3.8 - 10.8 Thousand/uL 4.4 6.1 -  Hemoglobin 13.2 - 17.1 g/dL 13.8 11.7(L) 10.2(L)  Hematocrit 38 - 50 % 42.9 35.2(L) 32.0(L)  Platelets  140 - 400 Thousand/uL 214 250 -      CMP     Component Value Date/Time   NA 137 12/23/2019 1241   NA 138 09/23/2019 1427   K 5.0 12/23/2019 1241   CL 103 12/23/2019 1241   CO2 28 12/23/2019 1241   GLUCOSE 169 (H) 12/23/2019 1241   BUN 15 12/23/2019 1241   BUN 14 09/23/2019 1427   CREATININE 0.92 12/23/2019 1241   CALCIUM 9.7 12/23/2019 1241   PROT 7.6 12/23/2019 1241   PROT 7.8 08/27/2015 1158   ALBUMIN 3.6 04/21/2019 0543   ALBUMIN 3.9 08/27/2015 1158   AST 20 12/23/2019 1241   ALT 15 12/23/2019 1241   ALKPHOS 47 04/21/2019 0543   BILITOT 0.8 12/23/2019 1241   BILITOT 0.3 08/27/2015 1158   GFRNONAA 79 12/23/2019 1241   GFRAA 92 12/23/2019 1241     VAS [name] ABI WITH/WO TBI  Result Date: 01/31/2020 LOWER EXTREMITY DOPPLER STUDY Indications: Rest pain.  Performing Technologist: Charlane Ferretti RT (R)(VS)  Examination Guidelines: A complete evaluation includes at minimum, Doppler waveform signals and systolic blood pressure reading at the level of bilateral brachial, anterior tibial, and posterior tibial arteries, when vessel segments are accessible. Bilateral testing is considered an integral part of a complete examination. Photoelectric Plethysmograph (PPG) waveforms and toe systolic pressure readings are included as required and additional duplex testing as needed. Limited examinations for reoccurring indications may be performed as noted.  ABI Findings: +---------+------------------+-----+---------+--------+ Right    Rt Pressure (mmHg)IndexWaveform Comment  +---------+------------------+-----+---------+--------+ Brachial 162                                      +---------+------------------+-----+---------+--------+ ATA      167               1.03 triphasic          +---------+------------------+-----+---------+--------+ PTA      165               1.02 triphasic         +---------+------------------+-----+---------+--------+ Great Toe116               0.72 Normal            +---------+------------------+-----+---------+--------+ +---------+------------------+-----+---------+-------+ Left     Lt Pressure (mmHg)IndexWaveform Comment +---------+------------------+-----+---------+-------+ Brachial 159                                     +---------+------------------+-----+---------+-------+ ATA      132               0.81 triphasic        +---------+------------------+-----+---------+-------+ PTA      149               0.92 triphasic        +---------+------------------+-----+---------+-------+ Great Toe143               0.88 Normal           +---------+------------------+-----+---------+-------+  Summary: Right: Resting right ankle-brachial index is within normal range. No evidence of significant right lower extremity arterial disease. The right toe-brachial index is normal. Left: Resting left ankle-brachial index indicates mild left lower extremity arterial disease. The left toe-brachial index is normal. *See table(s) above for measurements and observations.  Electronically signed by Leotis Pain MD on 01/31/2020 at 8:26:34  AM.   Final        Assessment & Plan:   1. Claudication [facility]) The patient does have some evidence of minimal peripheral arterial disease, based on the previous noninvasive studies as well as the noninvasive studies today it is unlikely that the cause of his claudication-like symptoms is due to decreased perfusion. Patient is advised to follow-up with PCP for further work-up related to lower extremity claudication. I suspect there may be some spinal involvement. Otherwise, we will see the patient back in the office in 1 year to repeat noninvasive studies.  2. Essential hypertension Continue antihypertensive  medications as already ordered, these medications have been reviewed and there are no changes at this time.   3. Mixed hyperlipidemia Continue statin as ordered and reviewed, no changes at this time    Current Outpatient Medications on File Prior to Visit  Medication Sig Dispense Refill  . ACCU-CHEK FASTCLIX LANCETS MISC 1 each by Subdermal route 2 (two) times daily. 102 each 2  . allopurinol (ZYLOPRIM) 300 MG tablet Take 1 tablet (300 mg total) by mouth daily. 90 tablet 1  . amLODipine (NORVASC) 10 MG tablet Take 1 tablet (10 mg total) by mouth daily. 90 tablet 1  . aspirin EC 81 MG tablet Take 1 tablet (81 mg total) by mouth daily. Swallow whole.    Marland Kitchen atorvastatin (LIPITOR) 40 MG tablet Take 1 tablet (40 mg total) by mouth daily. 90 tablet 1  . Blood Glucose Monitoring Suppl (ACCU-CHEK AVIVA PLUS) w/Device KIT 1 each by Does not apply route 2 (two) times daily. Use as directed 100 kit 12  . glipiZIDE (GLUCOTROL XL) 5 MG 24 hr tablet Take 1 tablet (5 mg total) by mouth daily with breakfast. 90 tablet 1  . glucose blood (ACCU-CHEK AVIVA PLUS) test strip 1 each by Other route 2 (two) times daily as needed for other. Use as instructed 200 strip 2  . latanoprost (XALATAN) 0.005 % ophthalmic solution Place 1 drop into the left eye at bedtime.     . metFORMIN (GLUCOPHAGE-XR) 750 MG 24 hr tablet Take 2 tablets (1,500 mg total) by mouth daily with breakfast. 180 tablet 1  . niacin 100 MG tablet Take 100 mg by mouth at bedtime.    . Omega-3 Fatty Acids (FISH OIL PO) Take 1 capsule by mouth 2 (two) times daily.     . polyethylene glycol (MIRALAX / GLYCOLAX) packet Take 17 g by mouth at bedtime.     . tadalafil (CIALIS) 10 MG tablet Take 1-2 tablets (10-20 mg total) by mouth every three (3) days as needed for erectile dysfunction. 30 tablet 0  . tamsulosin (FLOMAX) 0.4 MG CAPS capsule Take 1 capsule (0.4 mg total) by mouth daily. 90 capsule 1   No current facility-administered medications on file  prior to visit.    There are no Patient Instructions on file for this visit. No follow-ups on file.   Kris Hartmann, NP

## 2020-02-22 DIAGNOSIS — E119 Type 2 diabetes mellitus without complications: Secondary | ICD-10-CM | POA: Diagnosis not present

## 2020-02-22 LAB — HM DIABETES EYE EXAM

## 2020-02-23 ENCOUNTER — Encounter: Payer: Self-pay | Admitting: Podiatry

## 2020-02-23 ENCOUNTER — Ambulatory Visit: Payer: Medicare HMO | Admitting: Podiatry

## 2020-02-23 ENCOUNTER — Other Ambulatory Visit: Payer: Self-pay

## 2020-02-23 DIAGNOSIS — E1142 Type 2 diabetes mellitus with diabetic polyneuropathy: Secondary | ICD-10-CM

## 2020-02-23 DIAGNOSIS — L84 Corns and callosities: Secondary | ICD-10-CM

## 2020-02-23 DIAGNOSIS — B351 Tinea unguium: Secondary | ICD-10-CM | POA: Diagnosis not present

## 2020-02-23 DIAGNOSIS — N1831 Chronic kidney disease, stage 3a: Secondary | ICD-10-CM

## 2020-02-23 DIAGNOSIS — M79676 Pain in unspecified toe(s): Secondary | ICD-10-CM | POA: Diagnosis not present

## 2020-02-23 NOTE — Progress Notes (Addendum)
Complaint:  Visit Type: Patient returns to my office for risk foot care.  This patient requires this care by a professional since this patient will be at a high risk due to having diabetes.  This patient is unable to cut his own nails since he cannot reach his nails.  This patient presents for at risk foot care today.  He also states that he has a painful corn on the second toe right foot.  Podiatric Exam: Vascular: dorsalis pedis and posterior tibial pulses are palpable bilateral. Capillary return is immediate. Temperature gradient is WNL. Skin turgor WNL  Sensorium: Normal Semmes Weinstein monofilament test. Normal tactile sensation bilaterally. Nail Exam: Pt has thick disfigured discolored nails with subungual debris noted bilateral entire nail hallux through fifth toenails.  Nails are thick painful and deformed 1-5  B/L. Ulcer Exam: There is no evidence of ulcer or pre-ulcerative changes or infection. Orthopedic Exam: Muscle tone and strength are WNL. No limitations in general ROM. No crepitus or effusions noted. HAV  B/L. Hammer toes second  B/L Skin: No Porokeratosis. No infection or ulcers.  Corn second toe medial aspect @ PIPJ. Right foot.  Diagnosis:  Onychomycosis, , Pain in right toe, pain in left toes, Corn/callus  Treatment & Plan Procedures and Treatment: Consent by patient was obtained for treatment procedures. The patient understood the discussion of treatment and procedures well. All questions were answered thoroughly reviewed. Debridement of mycotic and hypertrophic toenails, 1 through 5 bilateral and clearing of subungual debris. No ulceration, no infection noted.   Debride corn second toe right foot with # 15 blade. Told this patient to return for periodic foot evaluation to help reduce potential of at risk complications.  Gave him excessive pads due to corn. Return Visit-Office Procedure: Patient instructed to return to the office for a follow up visit 3 months for continued  evaluation and treatment.    Gardiner Barefoot DPM

## 2020-03-12 ENCOUNTER — Ambulatory Visit: Payer: Self-pay

## 2020-03-12 DIAGNOSIS — E1129 Type 2 diabetes mellitus with other diabetic kidney complication: Secondary | ICD-10-CM

## 2020-03-12 NOTE — Chronic Care Management (AMB) (Signed)
Chronic Care Management   Follow Up Note   03/12/2020 Name: Jordan [name] MRN: [id number] DOB: [birth year]  Primary Care Provider: Steele Sizer, MD Reason for referral : Chronic Care Management  Jordan [name] is a 79 y.o. year old male who is a primary care patient of Steele Sizer, MD. A brief telephonic outreach was conducted today.   Review of [name] status, including review of consultants reports, relevant labs and test results was conducted today. Collaboration with appropriate care team members was performed as part of the comprehensive evaluation and provision of chronic care management services.    SDOH (Social Determinants of Health) assessments performed: No     Outpatient Encounter Medications as of 03/12/2020  Medication Sig  . ACCU-CHEK FASTCLIX LANCETS MISC 1 each by Subdermal route 2 (two) times daily.  Marland Kitchen allopurinol (ZYLOPRIM) 300 MG tablet Take 1 tablet (300 mg total) by mouth daily.  Marland Kitchen amLODipine (NORVASC) 10 MG tablet Take 1 tablet (10 mg total) by mouth daily.  Marland Kitchen aspirin EC 81 MG tablet Take 1 tablet (81 mg total) by mouth daily. Swallow whole.  Marland Kitchen atorvastatin (LIPITOR) 40 MG tablet Take 1 tablet (40 mg total) by mouth daily.  . Blood Glucose Monitoring Suppl (ACCU-CHEK AVIVA PLUS) w/Device KIT 1 each by Does not apply route 2 (two) times daily. Use as directed  . glipiZIDE (GLUCOTROL XL) 5 MG 24 hr tablet Take 1 tablet (5 mg total) by mouth daily with breakfast.  . glucose blood (ACCU-CHEK AVIVA PLUS) test strip 1 each by Other route 2 (two) times daily as needed for other. Use as instructed  . latanoprost (XALATAN) 0.005 % ophthalmic solution Place 1 drop into the left eye at bedtime.   . metFORMIN (GLUCOPHAGE-XR) 750 MG 24 hr tablet Take 2 tablets (1,500 mg total) by mouth daily with breakfast.  . niacin 100 MG tablet Take 100 mg by mouth at bedtime.  . Omega-3 Fatty Acids (FISH OIL PO) Take 1 capsule by mouth 2 (two) times daily.   .  polyethylene glycol (MIRALAX / GLYCOLAX) packet Take 17 g by mouth at bedtime.   . tadalafil (CIALIS) 10 MG tablet Take 1-2 tablets (10-20 mg total) by mouth every three (3) days as needed for erectile dysfunction.  . tamsulosin (FLOMAX) 0.4 MG CAPS capsule Take 1 capsule (0.4 mg total) by mouth daily.   No facility-administered encounter medications on file as of 03/12/2020.      Goals Addressed            This Visit's Progress   . COMPLETED: Chronic Disease Management       CARE PLAN ENTRY (see longitudinal plan of care for additional care plan information)  Current Barriers:  . Chronic Disease Management support and education needs related to Hypertension, Diabetes, Chronic Bronchitis, HLD and CKD.   Case Manager Clinical Goal(s):  Over the next 120 days, patient will: . Attend all medical appointments as scheduled. . Take all medications as prescribed.  . Monitor blood pressure and record readings. . Consistently monitor fasting blood glucose and maintain a log. . Follow safety measures to prevent falls and injuries.  Interventions:  . Inter-disciplinary care team collaboration (see longitudinal plan of care)  . Discussed care management needs. [name] reports doing well today. Reports taking medications and monitoring his blood glucose as advised. Reports most blood glucose readings have been within range. He feels that he is doing well with diabetes self-management. Denies need for additional resources. During our previous  outreach we discussed his activity tolerance along with safety measures to prevent falls. He states that he is ambulating well. Denies urgent concerns or changes in care management needs. Agreed to notify his provider or call if additional outreach is needed.   Patient Self Care Activities:  . Self administers medications . Attends scheduled provider appointments . Calls pharmacy for medication refills . Performs ADL's independently . Performs  IADL's independently   Please see past updates related to this goal by clicking on the "Past Updates" button in the selected goal             PLAN [name] has met his care management goals. He is scheduled to be evaluated by his PCP on 04/24/20. He agreed to notify his provider or call if additional outreach is needed.     [facility] Care Management [facility] [phone]

## 2020-03-20 ENCOUNTER — Other Ambulatory Visit: Payer: Self-pay

## 2020-03-20 ENCOUNTER — Ambulatory Visit (INDEPENDENT_AMBULATORY_CARE_PROVIDER_SITE_OTHER): Payer: Medicare HMO

## 2020-03-20 DIAGNOSIS — I350 Nonrheumatic aortic (valve) stenosis: Secondary | ICD-10-CM

## 2020-03-20 LAB — ECHOCARDIOGRAM COMPLETE
AR max vel: 1.18 cm2
AV Area VTI: 1.12 cm2
AV Area mean vel: 0.9 cm2
AV Mean grad: 47 mmHg
AV Peak grad: 63 mmHg
Ao pk vel: 3.97 m/s
Area-P 1/2: 2.91 cm2
S' Lateral: 3.48 cm

## 2020-03-20 MED ORDER — PERFLUTREN LIPID MICROSPHERE
1.0000 mL | INTRAVENOUS | Status: AC | PRN
  Administered 2020-03-20: 2 mL via INTRAVENOUS

## 2020-03-20 NOTE — Patient Instructions (Signed)
Thank you for allowing the Chronic Care Management team to participate in your care.   Goals Addressed            This Visit's Progress    COMPLETED: Chronic Disease Management       CARE PLAN ENTRY (see longitudinal plan of care for additional care plan information)  Current Barriers:   Chronic Disease Management support and education needs related to Hypertension, Diabetes, Chronic Bronchitis, HLD and CKD.   Case Manager Clinical Goal(s):  Over the next 120 days, patient will:  Attend all medical appointments as scheduled.  Take all medications as prescribed.   Monitor blood pressure and record readings.  Consistently monitor fasting blood glucose and maintain a log.  Follow safety measures to prevent falls and injuries.  Interventions:   Inter-disciplinary care team collaboration (see longitudinal plan of care)   Discussed care management needs. Jordan [name] reports doing well today. Reports taking medications and monitoring his blood glucose as advised. Reports most blood glucose readings have been within range. He feels that he is doing well with diabetes self-management. Denies need for additional resources. During our previous outreach we discussed his activity tolerance along with safety measures to prevent falls. He states that he is ambulating well. Denies urgent concerns or changes in care management needs. Agreed to notify his provider or call if additional outreach is needed.   Patient Self Care Activities:   Self administers medications  Attends scheduled provider appointments  Calls pharmacy for medication refills  Performs ADL's independently  Performs IADL's independently   Please see past updates related to this goal by clicking on the "Past Updates" button in the selected goal          Jordan [name] verbalized understanding of the information discussed during the telephonic outreach today. Declined need for printed/mail information. He has  met his care management goals. He is scheduled to be evaluated by his PCP on 04/24/20. He agreed to notify his provider or call if additional outreach is needed.     [facility] Care Management [facility] [phone]

## 2020-03-26 ENCOUNTER — Telehealth: Payer: Self-pay

## 2020-03-26 NOTE — Telephone Encounter (Signed)
Left detail message on VM of pt's recent echo results okay by DPR, Dr. Rockey Situ advised  "Recent echocardiogram suggestive of worsening aortic valve stenosis  Would recommend a transesophageal echo to determine if we need a more aggressive approach for his aortic valve stenosis such as surgery or if we can wait  If he would like an office visit to discuss new echocardiogram findings this can also be scheduled" Advised to call office for any concerns or questions or to make a follow-up appt to discuss his results further, also to determine if a transesophageal echo needs to be schedule.

## 2020-03-27 ENCOUNTER — Other Ambulatory Visit: Payer: Self-pay

## 2020-03-27 ENCOUNTER — Ambulatory Visit: Payer: Medicare HMO | Admitting: Cardiovascular Disease

## 2020-03-27 ENCOUNTER — Encounter: Payer: Self-pay | Admitting: Cardiovascular Disease

## 2020-03-27 VITALS — BP 130/70 | HR 88 | Ht 69.0 in | Wt 297.0 lb

## 2020-03-27 DIAGNOSIS — N1831 Chronic kidney disease, stage 3a: Secondary | ICD-10-CM

## 2020-03-27 DIAGNOSIS — R809 Proteinuria, unspecified: Secondary | ICD-10-CM

## 2020-03-27 DIAGNOSIS — I350 Nonrheumatic aortic (valve) stenosis: Secondary | ICD-10-CM | POA: Diagnosis not present

## 2020-03-27 DIAGNOSIS — I700 Atherosclerosis of aorta: Secondary | ICD-10-CM

## 2020-03-27 DIAGNOSIS — E1129 Type 2 diabetes mellitus with other diabetic kidney complication: Secondary | ICD-10-CM | POA: Diagnosis not present

## 2020-03-27 DIAGNOSIS — I10 Essential (primary) hypertension: Secondary | ICD-10-CM

## 2020-03-27 NOTE — Patient Instructions (Signed)
TEE in March to April, We will call you to schedule at [facility]  Medication Instructions:  No changes  If you need a refill on your cardiac medications before your next appointment, please call your pharmacy.    Lab work: No new labs needed   If you have labs (blood work) drawn today and your tests are completely normal, you will receive your results only by: Marland Kitchen MyChart Message (if you have MyChart) OR . A paper copy in the mail If you have any lab test that is abnormal or we need to change your treatment, we will call you to review the results.   Testing/Procedures: No new testing needed   Follow-Up: At [facility], you and your health needs are our priority.  As part of our continuing mission to provide you with exceptional heart care, we have created designated Provider Care Teams.  These Care Teams include your primary Cardiologist (physician) and Advanced Practice Providers (APPs -  Physician Assistants and Nurse Practitioners) who all work together to provide you with the care you need, when you need it.  . You will need a follow up appointment in 6 months  . Providers on your designated Care Team:   . Murray Hodgkins, NP . Christell Faith, PA-C . Marrianne Mood, PA-C  Any Other Special Instructions Will Be Listed Below (If Applicable).  COVID-19 Vaccine Information can be found at: [email or web address] For questions related to vaccine distribution or appointments, please email [email or web address] or call [phone].

## 2020-03-27 NOTE — Progress Notes (Signed)
Cardiology Office Note  Date:  03/27/2020   ID:  Jordan [name], DOB 1940-09-10, MRN [id number]  PCP:  Steele Sizer, MD   Chief Complaint  Patient presents with  . Follow-up    F/U after echo    HPI:  Jordan [name] is a 80 year old gentleman with past medical history of Diabetes Smoked, age 27, 20 years Morbid obesity ARB/ACE causing angioedema Essential hypertension Hyperlipidemia Aortic valve stenosis, last evaluated 2014 Who presents for f/u of his aortic valve stenosis   He presents today for discussion of his aortic valve stenosis Presents with wheelchair, cane Not very active at baseline No significant chest pain or shortness of breath on exertion Denies significant leg swelling  Echocardiogram January 2022 Ejection fraction 50 to 55%, severe aortic valve stenosis with mean gradient 47 mmHg.  Heavily calcified Aortic valve area VTI 1.12 cm Peak gradient 63 mmHg V-max 397 cm  For comparison, echo from June 2020 reviewed  Severe calcifcation of the  aortic valve. Moderate stenosis of the aortic valve. Mean gradient 26 mm  Hg, peak gradient 45 mm Hg, Peak velocity 3.34 m/sec   Lab work reviewed with him in detail Total chol 124 LDL 60 HBA1C 7.7  EKG personally reviewed by myself on todays visit Shows normal sinus rhythm right bundle branch block rate 88 bpm  PMH:   has a past medical history of Aortic stenosis, Chronic kidney disease, stage II (mild), Decreased libido, Diabetes mellitus without complication ([place]), Glaucoma, Gout (2009), Hernia (4098,1191), Hyperlipidemia, Hypertension, Inguinal hernia without mention of obstruction or gangrene, unilateral or unspecified, (not specified as recurrent), Lumbago, Obesity, and Unspecified constipation.  PSH:    Past Surgical History:  Procedure Laterality Date  . cataract surgery    . COLONOSCOPY  2012  . COLONOSCOPY N/A 04/22/2019   Procedure: COLONOSCOPY;  Surgeon: Mansouraty, Telford Nab., MD;   Location: [facility];  Service: Gastroenterology;  Laterality: N/A;  . EYE SURGERY  2011   cataract  . UMBILICAL HERNIA REPAIR  2011    Current Outpatient Medications  Medication Sig Dispense Refill  . ACCU-CHEK FASTCLIX LANCETS MISC 1 each by Subdermal route 2 (two) times daily. 102 each 2  . allopurinol (ZYLOPRIM) 300 MG tablet Take 1 tablet (300 mg total) by mouth daily. 90 tablet 1  . amLODipine (NORVASC) 10 MG tablet Take 1 tablet (10 mg total) by mouth daily. 90 tablet 1  . aspirin EC 81 MG tablet Take 1 tablet (81 mg total) by mouth daily. Swallow whole.    Marland Kitchen atorvastatin (LIPITOR) 40 MG tablet Take 1 tablet (40 mg total) by mouth daily. 90 tablet 1  . Blood Glucose Monitoring Suppl (ACCU-CHEK AVIVA PLUS) w/Device KIT 1 each by Does not apply route 2 (two) times daily. Use as directed 100 kit 12  . glipiZIDE (GLUCOTROL XL) 5 MG 24 hr tablet Take 1 tablet (5 mg total) by mouth daily with breakfast. 90 tablet 1  . glucose blood (ACCU-CHEK AVIVA PLUS) test strip 1 each by Other route 2 (two) times daily as needed for other. Use as instructed 200 strip 2  . latanoprost (XALATAN) 0.005 % ophthalmic solution Place 1 drop into the left eye at bedtime.     . metFORMIN (GLUCOPHAGE-XR) 750 MG 24 hr tablet Take 2 tablets (1,500 mg total) by mouth daily with breakfast. 180 tablet 1  . niacin 100 MG tablet Take 100 mg by mouth at bedtime.    . Omega-3 Fatty Acids (FISH OIL PO) Take 1  capsule by mouth 2 (two) times daily.     . polyethylene glycol (MIRALAX / GLYCOLAX) packet Take 17 g by mouth at bedtime.     . tadalafil (CIALIS) 10 MG tablet Take 1-2 tablets (10-20 mg total) by mouth every three (3) days as needed for erectile dysfunction. 30 tablet 0  . tamsulosin (FLOMAX) 0.4 MG CAPS capsule Take 1 capsule (0.4 mg total) by mouth daily. 90 capsule 1   No current facility-administered medications for this visit.     Allergies:   Ace inhibitors   Social History:  The patient  reports that  he quit smoking about 44 years ago. His smoking use included cigarettes. He started smoking about 55 years ago. He has a 10.00 pack-year smoking history. He has never used smokeless tobacco. He reports current alcohol use of about 1.0 standard drink of alcohol per week. He reports that he does not use drugs.   Family History:   family history includes Diabetes in his brother, sister, and sister; Heart disease in his father; Hypertension in his father; Prostate cancer in his brother.    Review of Systems: Review of Systems  Constitutional: Negative.   HENT: Negative.   Respiratory: Negative.   Cardiovascular: Negative.   Gastrointestinal: Negative.   Musculoskeletal: Negative.        Leg weakness  Neurological: Negative.   Psychiatric/Behavioral: Negative.   All other systems reviewed and are negative.   PHYSICAL EXAM: VS:  BP 130/70 (BP Location: Right Arm, Patient Position: Sitting, Cuff Size: Large)   Pulse 88   Ht '5\' 9"'  (1.753 m)   Wt 297 lb (134.7 kg)   SpO2 98%   BMI 43.86 kg/m  , BMI Body mass index is 43.86 kg/m. Presenting in a wheelchair Constitutional:  oriented to person, place, and time. No distress.  HENT:  Head: Grossly normal Eyes:  no discharge. No scleral icterus.  Neck: No JVD, no carotid bruits  Cardiovascular: Regular rate and rhythm, no murmurs appreciated Pulmonary/Chest: Clear to auscultation bilaterally, no wheezes or rails Abdominal: Soft.  no distension.  no tenderness.  Musculoskeletal: Normal range of motion Neurological:  normal muscle tone. Coordination normal. No atrophy Skin: Skin warm and dry Psychiatric: normal affect, pleasant  Recent Labs: 09/23/2019: Magnesium 1.7 12/23/2019: ALT 15; BUN 15; Creat 0.92; Hemoglobin 13.8; Platelets 214; Potassium 5.0; Sodium 137    Lipid Panel Lab Results  Component Value Date   CHOL 124 12/23/2019   HDL 47 12/23/2019   LDLCALC 60 12/23/2019   TRIG 85 12/23/2019      Wt Readings from Last 3  Encounters:  03/27/20 297 lb (134.7 kg)  02/15/20 296 lb 12.8 oz (134.6 kg)  01/27/20 296 lb (134.3 kg)     ASSESSMENT AND PLAN:  Essential hypertension - Plan: EKG 12-Lead Blood pressure is well controlled on today's visit. No changes made to the medications.  Aortic valve stenosis, etiology of cardiac valve disease unspecified - Moderate aortic valve stenosis June 2020 Severe stenosis January 2022 Reports feeling asymptomatic, discussed first treatment options with him Suggested either TEE versus repeat echocardiogram 6 months time He prefers TEE after COVID in March or April We will call him to schedule at that time  Mixed hyperlipidemia Cholesterol is at goal on the current lipid regimen. No changes to the medications were made.  Controlled type 2 diabetes mellitus with microalbuminuria, without long-term current use of insulin (HCC) Hemoglobin A1c 7.7 Recommended low carbohydrate diet, smaller portions  Obesity, Class III, BMI  40-49.9 (morbid obesity) ([place]) Recommend he continue his walking program, calorie restriction    Total encounter time more than 25 minutes  Greater than 50% was spent in counseling and coordination of care with the patient    Orders Placed This Encounter  Procedures  . EKG 12-Lead     Signed, Esmond Plants, M.D., Ph.D. 03/27/2020  [place], [place]

## 2020-04-23 NOTE — Progress Notes (Signed)
Name: Jordan [name]   MRN: [id number]    DOB: 1940/12/30   Date:04/24/2020       Progress Note  Subjective  Chief Complaint  Follow up   HPI   DM with microalbuminuria , obesity and HTN and ED: he is back on Glipizide 81m ERand still on Metformin one dailyHgbA1C used to be above 9% but he has changed his diet, walking daily [place] about [place] minutes [place] the [place]. His A1C iis at goal today at 7.2 % Cialis  is working well and uses prn for ED.He cannot take ACE because of history of angioedema, bpis controlled with Norvasc.He denies polyphagia, polydipsia but has polyuria - also has BPHHe denies hypoglycemic episodeslately, fasting sugar around 140's, lowest 125   Chronic bronchitis: he used to smoke but quit many years ago, he still has a cough that is productive at times, coughs iin the mornings and has a pale yellow sputum, he denies  wheezing  He has some sob with activity but stable over the years.  He does not want any inhalers   [facility], on Flomax now . He still has nocturia 2-3 times per night   HTN: he is compliant with mediations, bp is at goal, no chest pain or dizziness.  He has lower extremity edema that has been stable over the years   Hyperlipidemia: taking lipitor and denies side effects, no myalgia.Reviewed last last LDL and it was at goal at 60, continue current medications   Obesity :his weight has been stable over the past year, he states continues to avoid sweets and has been walking , unchanged   Aortic Valve stenosis:under the care of cardiologist,  no syncope or chest pain, had echo normal EF. 50-55 %, Echo showed ascending aorta dilation.   Atherosclerosis of aorta: he is on statin therapy, taking aspirin 81 mg daily and no side effects   History of anemia: secondary to rectal bleeding, last HCT back to normal, no recent episode of rectal bleeding   Gout:taking allopurinol and not on diuretics  -no flares in a long time   Claudication: going on for months, used to walk for one hour but now after 30 minutes he needs to stop to rest because legs feels heavy and aching, once he stops he is able to walk again . He was seen by vascular surgeon and felt likely secondary from lumbar spine problems. Discussed risk and benefits of therapy for lumbar spine stenosis and since symptoms only when walking he will continue physical activity and stop to take breaks as needed   Gait problems: he has OA, he states he feels down because he no longer can do things he enjoys, he has OA and also having claudication now, he states he got a walker with a bench but is not using at this time, he states he will wait until he needs to use it   Patient Active Problem List   Diagnosis Date Noted  . 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[place] 04/24/2020  . Claudication ([place] 01/17/2020  . Swelling of limb 01/17/2020  . Diverticulosis of colon with hemorrhage 05/26/2019  . History of anemia 05/26/2019  . Chronic kidney disease (CKD) stage G3a/A1, moderately decreased glomerular filtration rate (GFR) between 45-59 mL/min/1.73 square meter and albuminuria creatinine ratio less than 30 mg/g (HCC) 05/23/2019  . Atherosclerosis of aorta ([place] 04/26/2019  . Rectal bleed 04/22/2019  . Rectal bleeding 04/20/2019  . Abnormal liver function 04/20/2019  . Chronic bronchitis (  [place]) 07/30/2018  . Bilateral hydrocele 11/27/2015  . Mass, scrotum 11/27/2015  . Ventral hernia without obstruction or gangrene 11/27/2015  . Controlled type 2 diabetes mellitus with microalbuminuria ([place]) 10/26/2015  . 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 Hyperlipidemia 11/07/2014  . Essential hypertension 11/07/2014  . Obesity, Class III, BMI 40-49.9 (morbid obesity) ([place]) 11/07/2014  . Gout 11/07/2014  . Colon cancer screening 11/07/2014  . 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  .  Epistaxis 09/21/2014  . 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 Aortic valve stenosis 12/02/2012  . Constipation 10/26/2012  . Incisional hernia, without obstruction or gangrene 10/26/2012  . Left inguinal hernia     Past Surgical History:  Procedure Laterality Date  . cataract surgery    . COLONOSCOPY  2012  . COLONOSCOPY N/A 04/22/2019   Procedure: COLONOSCOPY;  Surgeon: Mansouraty, Telford Nab., MD;  Location: [facility];  Service: Gastroenterology;  Laterality: N/A;  . EYE SURGERY  2011   cataract  . UMBILICAL HERNIA REPAIR  2011    Family History  Problem Relation Age of Onset  . Heart disease Father   . Hypertension Father   . Diabetes Sister   . Diabetes Brother   . Prostate cancer Brother   . Diabetes Sister   . Kidney disease Neg Hx   . Kidney cancer Neg Hx   . Bladder Cancer Neg Hx   . Colon cancer Neg Hx   . Esophageal cancer Neg Hx   . Inflammatory bowel disease Neg Hx   . Liver disease Neg Hx   . Pancreatic cancer Neg Hx   . Rectal cancer Neg Hx   . Stomach cancer Neg Hx     Social History   Tobacco Use  . Smoking status: Former Smoker    Packs/day: 1.00    Years: 10.00    Pack years: 10.00    Types: Cigarettes    Start date: 03/17/1965    Quit date: 11/27/1975    Years since quitting: 44.4  . Smokeless tobacco: Never Used  . Tobacco comment: quit 40 years  Substance Use Topics  . Alcohol use: Yes    Alcohol/week: 1.0 standard drink    Types: 1 Standard drinks or equivalent per week    Comment: socially - 1 x year     Current Outpatient Medications:  .  ACCU-CHEK FASTCLIX LANCETS MISC, 1 each by Subdermal route 2 (two) times daily., Disp: 102 each, Rfl: 2 .  allopurinol (ZYLOPRIM) 300 MG tablet, Take 1 tablet (300 mg total) by mouth daily., Disp: 90 tablet, Rfl: 1 .  amLODipine (NORVASC) 10 MG tablet, Take 1 tablet (10 mg total) by mouth daily., Disp: 90 tablet, Rfl: 1 .  aspirin EC 81 MG tablet, Take 1 tablet (81 mg total) by mouth daily. Swallow whole.,  Disp: , Rfl:  .  atorvastatin (LIPITOR) 40 MG tablet, Take 1 tablet (40 mg total) by mouth daily., Disp: 90 tablet, Rfl: 1 .  Blood Glucose Monitoring Suppl (ACCU-CHEK AVIVA PLUS) w/Device KIT, 1 each by Does not apply route 2 (two) times daily. Use as directed, Disp: 100 kit, Rfl: 12 .  glipiZIDE (GLUCOTROL XL) 5 MG 24 hr tablet, Take 1 tablet (5 mg total) by mouth daily with breakfast., Disp: 90 tablet, Rfl: 1 .  glucose blood (ACCU-CHEK AVIVA PLUS) test strip, 1 each by Other route 2 (two) times daily as needed for other. Use as instructed, Disp: 200 strip, Rfl: 2 .  latanoprost (XALATAN)  0.005 % ophthalmic solution, Place 1 drop into the left eye at bedtime. , Disp: , Rfl:  .  metFORMIN (GLUCOPHAGE-XR) 750 MG 24 hr tablet, Take 2 tablets (1,500 mg total) by mouth daily with breakfast., Disp: 180 tablet, Rfl: 1 .  niacin 100 MG tablet, Take 100 mg by mouth at bedtime., Disp: , Rfl:  .  Omega-3 Fatty Acids (FISH OIL PO), Take 1 capsule by mouth 2 (two) times daily. , Disp: , Rfl:  .  polyethylene glycol (MIRALAX / GLYCOLAX) packet, Take 17 g by mouth at bedtime. , Disp: , Rfl:  .  tadalafil (CIALIS) 10 MG tablet, Take 1-2 tablets (10-20 mg total) by mouth every three (3) days as needed for erectile dysfunction., Disp: 30 tablet, Rfl: 0 .  tamsulosin (FLOMAX) 0.4 MG CAPS capsule, Take 1 capsule (0.4 mg total) by mouth daily., Disp: 90 capsule, Rfl: 1  Allergies  Allergen Reactions  . Ace Inhibitors Other (See Comments)    angioedema    I personally reviewed active problem list, medication list, allergies, family history, social history, health maintenance with the patient/caregiver today.   ROS  Constitutional: Negative for fever or weight change.  Respiratory: Negative for cough and shortness of breath.   Cardiovascular: Negative for chest pain or palpitations.  Gastrointestinal: Negative for abdominal pain, no bowel changes.  Musculoskeletal: Negative for gait problem or joint  swelling.  Skin: Negative for rash.  Neurological: Negative for dizziness or headache.  No other specific complaints in a complete review of systems (except as listed in HPI above).  Objective  Vitals:   04/24/20 1135  BP: 132/62  Pulse: 65  Resp: 16  Temp: 97.9 F (36.6 C)  TempSrc: Oral  SpO2: 97%  Weight: 296 lb (134.3 kg)  Height: _0  (1.753 m)    Body mass index is 43.71 kg/m.  Physical Exam  Constitutional: Patient appears well-developed and well-nourished. Obese  No distress.  HEENT: head atraumatic, normocephalic, pupils equal and reactive to light, neck supple Cardiovascular: Normal rate, regular rhythm and normal heart sounds.  3/6 SEM on right 2nd intercostal space . Trace  BLE edema. Pulmonary/Chest: Effort normal and breath sounds normal. No respiratory distress. Abdominal: Soft.  There is no tenderness. Muscular Skeletal: crepitus during extension of both knees  Psychiatric: Patient has a normal mood and affect. behavior is normal. Judgment and thought content normal.  Recent Results (from the past 2160 hour(s))  HM DIABETES EYE EXAM     Status: None   Collection Time: 02/22/20 12:00 AM  Result Value Ref Range   HM Diabetic Eye Exam No Retinopathy No Retinopathy  ECHOCARDIOGRAM COMPLETE     Status: None   Collection Time: 03/20/20  2:12 PM  Result Value Ref Range   AR max vel 1.18 cm2   AV Peak grad 63.0 mmHg   Ao pk vel 3.97 m/s   Area-P 1/2 2.91 cm2   AV Area VTI 1.12 cm2   AV Mean grad 47.0 mmHg   AV Area mean vel 0.90 cm2   S' Lateral 3.48 cm  POCT HgB A1C     Status: Abnormal   Collection Time: 04/24/20 11:45 AM  Result Value Ref Range   Hemoglobin A1C 7.2 (A) 4.0 - 5.6 %   HbA1c POC (<> result, manual entry)     HbA1c, POC (prediabetic range)     HbA1c, POC (controlled diabetic range)       PHQ2/9: Depression screen [facility] 2/9 04/24/2020 12/23/2019 12/01/2019 10/21/2019 08/16/2019  Decreased Interest 0 2 0 0 0  Down, Depressed, Hopeless 0 1 0 0  0  PHQ - 2 Score 0 3 0 0 0  Altered sleeping 1 2 - - 0  Tired, decreased energy 1 2 - - 0  Change in appetite 0 1 - - 0  Feeling bad or failure about yourself  0 1 - - 0  Trouble concentrating 0 1 - - 0  Moving slowly or fidgety/restless 0 0 - - 0  Suicidal thoughts 0 0 - - 0  PHQ-9 Score 2 10 - - 0  Difficult doing work/chores - Somewhat difficult - - -    phq 9 is negative   Fall Risk: Fall Risk  04/24/2020 12/23/2019 12/01/2019 10/21/2019 08/16/2019  Falls in the past year? 0 0 0 0 0  Number falls in past yr: 0 0 0 - -  Injury with Fall? 0 0 0 - -  Risk for fall due to : - - No Fall Risks - -  Follow up - - Falls prevention discussed Falls prevention discussed -     Functional Status Survey: Is the patient deaf or have difficulty hearing?: No Does the patient have difficulty seeing, even when wearing glasses/contacts?: No Does the patient have difficulty concentrating, remembering, or making decisions?: Yes Does the patient have difficulty walking or climbing stairs?: Yes Does the patient have difficulty dressing or bathing?: No Does the patient have difficulty doing errands alone such as visiting a doctor's office or shopping?: No    Assessment & Plan  1. Type 2 diabetes mellitus with microalbuminuria, without long-term current use of insulin (HCC)  - POCT HgB A1C  2. Claudication of both lower extremities (HCC)  Negative vascular evaluation  3. Benign prostatic hyperplasia with nocturia  - tamsulosin (FLOMAX) 0.4 MG CAPS capsule; Take 1 capsule (0.4 mg total) by mouth daily.  Dispense: 90 capsule; Refill: 1  4. Simple chronic bronchitis ([place])   5. Atherosclerosis of aorta ([place])  On statin therapy   6. Essential hypertension  - amLODipine (NORVASC) 10 MG tablet; Take 1 tablet (10 mg total) by mouth daily.  Dispense: 90 tablet; Refill: 1  7. Obesity, Class III, BMI 40-49.9 (morbid obesity) ([place])  Discussed with the patient the risk posed by an increased BMI.  Discussed importance of portion control, calorie counting and at least 150 minutes of physical activity weekly. Avoid sweet beverages and drink more water. Eat at least 6 servings of fruit and vegetables daily   8. Aortic valve stenosis, etiology of cardiac valve disease unspecified  Under the care of cardiologist   9. Hypertension associated with diabetes ([place])   10. Controlled type 2 diabetes mellitus with microalbuminuria, without long-term current use of insulin (HCC)  Microalbuminuria resolved with diabetes and HTN control  11. 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HCC)  Per recent echo   12. Controlled gout  - allopurinol (ZYLOPRIM) 300 MG tablet; Take 1 tablet (300 mg total) by mouth daily.  Dispense: 90 tablet; Refill: 1  13. Mixed hyperlipidemia  - atorvastatin (LIPITOR) 40 MG tablet; Take 1 tablet (40 mg total) by mouth daily.  Dispense: 90 tablet; Refill: 1  14. 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  Dispense: 90 tablet; Refill: 1 - metFORMIN (GLUCOPHAGE-XR) 750 MG 24 hr tablet; Take 2 tablets (1,500 mg total) by mouth daily with breakfast.  Dispense:  180 tablet; Refill: 1  15. ED (erectile dysfunction) of organic origin  - tadalafil (CIALIS) 10 MG tablet; Take 1-2 tablets (10-20 mg total) by mouth every three (3) days as needed for erectile dysfunction.  Dispense: 30 tablet; Refill: 0

## 2020-04-24 ENCOUNTER — Ambulatory Visit (INDEPENDENT_AMBULATORY_CARE_PROVIDER_SITE_OTHER): Payer: Medicare HMO | Admitting: Family Medicine

## 2020-04-24 ENCOUNTER — Other Ambulatory Visit: Payer: Self-pay

## 2020-04-24 ENCOUNTER — Encounter: Payer: Self-pay | Admitting: Family Medicine

## 2020-04-24 VITALS — BP 132/62 | HR 65 | Temp 97.9°F | Resp 16 | Ht 69.0 in | Wt 296.0 lb

## 2020-04-24 DIAGNOSIS — E782 Mixed hyperlipidemia: Secondary | ICD-10-CM

## 2020-04-24 DIAGNOSIS — E1159 Type 2 diabetes mellitus with other circulatory complications: Secondary | ICD-10-CM | POA: Diagnosis not present

## 2020-04-24 DIAGNOSIS — N529 Male erectile dysfunction, unspecified: Secondary | ICD-10-CM

## 2020-04-24 DIAGNOSIS — E1129 Type 2 diabetes mellitus with other diabetic kidney complication: Secondary | ICD-10-CM

## 2020-04-24 DIAGNOSIS — I10 Essential (primary) hypertension: Secondary | ICD-10-CM

## 2020-04-24 DIAGNOSIS — I350 Nonrheumatic aortic (valve) stenosis: Secondary | ICD-10-CM

## 2020-04-24 DIAGNOSIS — J410 Simple chronic bronchitis: Secondary | ICD-10-CM | POA: Diagnosis not present

## 2020-04-24 DIAGNOSIS — I739 Peripheral vascular disease, unspecified: Secondary | ICD-10-CM | POA: Diagnosis not present

## 2020-04-24 DIAGNOSIS — E1140 Type 2 diabetes mellitus with diabetic neuropathy, unspecified: Secondary | ICD-10-CM

## 2020-04-24 DIAGNOSIS — R809 Proteinuria, unspecified: Secondary | ICD-10-CM | POA: Diagnosis not present

## 2020-04-24 DIAGNOSIS — N401 Enlarged prostate with lower urinary tract symptoms: Secondary | ICD-10-CM

## 2020-04-24 DIAGNOSIS — I152 Hypertension secondary to endocrine disorders: Secondary | ICD-10-CM

## 2020-04-24 DIAGNOSIS — M109 Gout, unspecified: Secondary | ICD-10-CM

## 2020-04-24 DIAGNOSIS — I700 Atherosclerosis of aorta: Secondary | ICD-10-CM

## 2020-04-24 DIAGNOSIS — I77810 Thoracic aortic ectasia: Secondary | ICD-10-CM

## 2020-04-24 DIAGNOSIS — E66813 Obesity, class 3: Secondary | ICD-10-CM

## 2020-04-24 DIAGNOSIS — R351 Nocturia: Secondary | ICD-10-CM

## 2020-04-24 DIAGNOSIS — N521 Erectile dysfunction due to diseases classified elsewhere: Secondary | ICD-10-CM

## 2020-04-24 LAB — POCT GLYCOSYLATED HEMOGLOBIN (HGB A1C): Hemoglobin A1C: 7.2 % — AB (ref 4.0–5.6)

## 2020-04-24 MED ORDER — TAMSULOSIN HCL 0.4 MG PO CAPS
0.4000 mg | ORAL_CAPSULE | Freq: Every day | ORAL | 1 refills | Status: DC
Start: 2020-04-24 — End: 2020-11-13

## 2020-04-24 MED ORDER — METFORMIN HCL ER 750 MG PO TB24: 1500.0000 mg | ORAL_TABLET | Freq: Every day | ORAL | 1 refills | Status: DC

## 2020-04-24 MED ORDER — ATORVASTATIN CALCIUM 40 MG PO TABS: 40.0000 mg | ORAL_TABLET | Freq: Every day | ORAL | 1 refills | Status: DC

## 2020-04-24 MED ORDER — ALLOPURINOL 300 MG PO TABS: 300.0000 mg | ORAL_TABLET | Freq: Every day | ORAL | 1 refills | Status: DC

## 2020-04-24 MED ORDER — GLIPIZIDE ER 5 MG PO TB24: 5.0000 mg | ORAL_TABLET | Freq: Every day | ORAL | 1 refills | Status: DC

## 2020-04-24 MED ORDER — TADALAFIL 10 MG PO TABS
10.0000 mg | ORAL_TABLET | ORAL | 0 refills | Status: DC | PRN
Start: 2020-04-24 — End: 2021-04-17

## 2020-04-24 MED ORDER — AMLODIPINE BESYLATE 10 MG PO TABS: 10.0000 mg | ORAL_TABLET | Freq: Every day | ORAL | 1 refills | Status: DC

## 2020-05-24 ENCOUNTER — Ambulatory Visit: Payer: Medicare HMO | Admitting: Podiatry

## 2020-05-24 ENCOUNTER — Other Ambulatory Visit: Payer: Self-pay

## 2020-05-24 ENCOUNTER — Encounter: Payer: Self-pay | Admitting: Podiatry

## 2020-05-24 DIAGNOSIS — E1142 Type 2 diabetes mellitus with diabetic polyneuropathy: Secondary | ICD-10-CM

## 2020-05-24 DIAGNOSIS — B351 Tinea unguium: Secondary | ICD-10-CM | POA: Diagnosis not present

## 2020-05-24 DIAGNOSIS — M79676 Pain in unspecified toe(s): Secondary | ICD-10-CM

## 2020-05-24 DIAGNOSIS — N1831 Chronic kidney disease, stage 3a: Secondary | ICD-10-CM

## 2020-05-24 DIAGNOSIS — L84 Corns and callosities: Secondary | ICD-10-CM

## 2020-05-24 NOTE — Progress Notes (Signed)
Complaint:  Visit Type: Patient returns to my office for risk foot care.  This patient requires this care by a professional since this patient will be at a high risk due to having diabetes.  This patient is unable to cut his own nails since he cannot reach his nails.  This patient presents for at risk foot care today.  He also states that he has a painful corn on the second toe right foot.  Podiatric Exam: Vascular: dorsalis pedis and posterior tibial pulses are palpable bilateral. Capillary return is immediate. Temperature gradient is WNL. Skin turgor WNL  Sensorium: Normal Semmes Weinstein monofilament test. Normal tactile sensation bilaterally. Nail Exam: Pt has thick disfigured discolored nails with subungual debris noted bilateral entire nail hallux through fifth toenails.  Nails are thick painful and deformed 1-5  B/L. Ulcer Exam: There is no evidence of ulcer or pre-ulcerative changes or infection. Orthopedic Exam: Muscle tone and strength are WNL. No limitations in general ROM. No crepitus or effusions noted. HAV  B/L. Hammer toes second  B/L Skin: No Porokeratosis. No infection or ulcers.  Corn second toe medial aspect @ PIPJ. Right foot.  Diagnosis:  Onychomycosis, , Pain in right toe, pain in left toes, Corn/callus  Treatment & Plan Procedures and Treatment: Consent by patient was obtained for treatment procedures. The patient understood the discussion of treatment and procedures well. All questions were answered thoroughly reviewed. Debridement of mycotic and hypertrophic toenails, 1 through 5 bilateral and clearing of subungual debris. No ulceration, no infection noted.   Debride corn second toe right foot with # 15 blade. Told this patient to return for periodic foot evaluation to help reduce potential of at risk complications.  Gave him  pads due to corn. Return Visit-Office Procedure: Patient instructed to return to the office for a follow up visit 3 months for continued evaluation  and treatment.    Gardiner Barefoot DPM

## 2020-07-30 ENCOUNTER — Telehealth: Payer: Self-pay

## 2020-07-30 NOTE — Telephone Encounter (Signed)
Attempted to reach out to pt, unable to get in contact, LDM on VM (DPR approved), Dr Rockey Situ suggesting  Needs tee for severe aortic valve stenosis  When he is ready   Advised pt to call back to schedule a f/u appt and have his TEE schedule at that time if he is still inquiring this procedure as discuss at last office visit in Jan 2022.

## 2020-08-01 ENCOUNTER — Telehealth: Payer: Self-pay

## 2020-08-01 NOTE — Telephone Encounter (Signed)
Attempted to reach out to pt again, still unable to get in contact via home phone or mobile phone on file, LDM on VM (DPR approved), Dr Rockey Situ suggesting  Needs tee for severe aortic valve stenosis  When he is ready   Advised pt to call back to schedule a f/u appt and have his TEE schedule at that time if he is still inquiring this procedure as discuss at last office visit in Jan 2022.

## 2020-08-03 NOTE — Telephone Encounter (Signed)
Could not be reached left VM

## 2020-08-15 ENCOUNTER — Encounter: Payer: Self-pay | Admitting: Family Medicine

## 2020-08-15 ENCOUNTER — Other Ambulatory Visit: Payer: Self-pay

## 2020-08-15 ENCOUNTER — Ambulatory Visit (INDEPENDENT_AMBULATORY_CARE_PROVIDER_SITE_OTHER): Payer: Medicare HMO | Admitting: Family Medicine

## 2020-08-15 ENCOUNTER — Telehealth: Payer: Self-pay

## 2020-08-15 VITALS — BP 128/68 | HR 96 | Temp 97.9°F | Resp 16 | Ht 69.0 in | Wt 294.0 lb

## 2020-08-15 DIAGNOSIS — R351 Nocturia: Secondary | ICD-10-CM

## 2020-08-15 DIAGNOSIS — I739 Peripheral vascular disease, unspecified: Secondary | ICD-10-CM

## 2020-08-15 DIAGNOSIS — R809 Proteinuria, unspecified: Secondary | ICD-10-CM | POA: Diagnosis not present

## 2020-08-15 DIAGNOSIS — I10 Essential (primary) hypertension: Secondary | ICD-10-CM

## 2020-08-15 DIAGNOSIS — I77810 Thoracic aortic ectasia: Secondary | ICD-10-CM

## 2020-08-15 DIAGNOSIS — I700 Atherosclerosis of aorta: Secondary | ICD-10-CM | POA: Diagnosis not present

## 2020-08-15 DIAGNOSIS — J410 Simple chronic bronchitis: Secondary | ICD-10-CM | POA: Diagnosis not present

## 2020-08-15 DIAGNOSIS — E1159 Type 2 diabetes mellitus with other circulatory complications: Secondary | ICD-10-CM | POA: Diagnosis not present

## 2020-08-15 DIAGNOSIS — N401 Enlarged prostate with lower urinary tract symptoms: Secondary | ICD-10-CM

## 2020-08-15 DIAGNOSIS — I152 Hypertension secondary to endocrine disorders: Secondary | ICD-10-CM

## 2020-08-15 DIAGNOSIS — M109 Gout, unspecified: Secondary | ICD-10-CM | POA: Diagnosis not present

## 2020-08-15 DIAGNOSIS — E1129 Type 2 diabetes mellitus with other diabetic kidney complication: Secondary | ICD-10-CM | POA: Diagnosis not present

## 2020-08-15 DIAGNOSIS — M170 Bilateral primary osteoarthritis of knee: Secondary | ICD-10-CM

## 2020-08-15 LAB — POCT GLYCOSYLATED HEMOGLOBIN (HGB A1C): Hemoglobin A1C: 7.2 % — AB (ref 4.0–5.6)

## 2020-08-15 NOTE — Telephone Encounter (Signed)
3rd attempt via this RN and one attempt by office clerk,  still unable to get in contact via home phone or his mobile phone that is current on file, LDM on VM (DPR approved), Dr Rockey Situ suggesting  Needs tee for severe aortic valve stenosis  When he is ready   Advised pt to call back to schedule a f/u appt and have his TEE schedule at that time if he is still inquiring this procedure as discuss at last office visit in Jan 2022.  Will send letter in the mail with the address on file as an appointment reminder and have been trying to make contact.

## 2020-08-15 NOTE — Progress Notes (Signed)
Name: Jordan [name]   MRN: [id number]    DOB: 12-18-1940   Date:08/15/2020       Progress Note  Subjective  Chief Complaint  Follow Up  HPI  DM with microalbuminuria , obesity and HTN and ED: he is back on Glipizide 30m ERand still on Metformin one dailyHgbA1C used to be above 9% but he has changed his diet, walking daily [place] about [place] the mall. His A1C has been stable past two times at 7.2 %  Cialis  is working well and uses prn for ED, he states sometimes but other times does not work, advised to discuss it with [facility].He cannot take ACE because of history of angioedema, bpis controlled with Norvasc.He denies polyphagia, polydipsia but has polyuria - also has BPHHe denies hypoglycemic episodeslately, fasting sugar around 140's when he gets up in the mornings.   Chronic bronchitis: he used to smoke but quit many years ago, he still has a cough that is productive at times, coughs iin the mornings and has a pale yellow sputum, he denies  wheezing  He has some sob with activity but stable over the years.  He does not want any inhalers . He states no significant variation with weather changes   [facility], on Flomax now . He still has nocturia 2-3 times per night , stable.   HTN: he is compliant with mediations, bp is at goal, no chest pain or dizziness.  He has lower extremity edema but still stable. He takes norvasc and may be part of his problem  Hyperlipidemia: taking lipitor and denies side effects, no myalgia.Reviewed last last LDL and it was at goal at 60. He is compliant   Obesity :his weight has been stable over the past year, down two pounds today He has been walking and being more careful with his diet   Aortic Valve stenosis:under the care of cardiologist,  no syncope or chest pain, had echo normal EF. 50-55 %, Echo showed ascending aorta dilation. Denies orthopnea  Atherosclerosis of aorta: he is  on statin therapy, taking aspirin 81 mg daily and no side effects . Unchanged   History of anemia: secondary to rectal bleeding, last HCT back to normal, no recent episode of rectal bleeding . Unchanged   Gout:taking allopurinol and not on diuretics -no flares in a long time . Last uric acid was normal   Claudication: going on for months, used to walk for one hour but now after 30 minutes he needs to stop to rest because legs feels heavy and aching, once he stops he is able to walk again . He was seen by vascular surgeon and felt likely secondary from lumbar spine problems. Discussed risk and benefits of therapy for lumbar spine stenosis and since symptoms only when walking he will continue physical activity and stop to take breaks as needed . He states symptoms have been stable   Gait problems: he has OA, he states symptoms have been stable, he uses a cane most of the time, occasionally uses a walker, he is still able to walk daily and is not feeling upset about not being able to be as active.    Patient Active Problem List   Diagnosis Date Noted  . 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[place] 04/24/2020  . Claudication ([place] 01/17/2020  . Diverticulosis of colon with hemorrhage 05/26/2019  . History of anemia 05/26/2019  . Atherosclerosis of aorta ([place] 04/26/2019  . Rectal bleed 04/22/2019  .  Rectal bleeding 04/20/2019  . Chronic bronchitis ([place]) 07/30/2018  . Bilateral hydrocele 11/27/2015  . Ventral hernia without obstruction or gangrene 11/27/2015  . Controlled type 2 diabetes mellitus with microalbuminuria ([place]) 10/26/2015  . 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 Hyperlipidemia 11/07/2014  . Essential hypertension 11/07/2014  . Obesity, Class III, BMI 40-49.9 (morbid obesity) ([place]) 11/07/2014  . Gout 11/07/2014  . 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  . Epistaxis 09/21/2014  . 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 Aortic valve  stenosis 12/02/2012  . Constipation 10/26/2012  . Incisional hernia, without obstruction or gangrene 10/26/2012  . Left inguinal hernia     Past Surgical History:  Procedure Laterality Date  . cataract surgery    . COLONOSCOPY  2012  . COLONOSCOPY N/A 04/22/2019   Procedure: COLONOSCOPY;  Surgeon: Mansouraty, Telford Nab., MD;  Location: [facility];  Service: Gastroenterology;  Laterality: N/A;  . EYE SURGERY  2011   cataract  . UMBILICAL HERNIA REPAIR  2011    Family History  Problem Relation Age of Onset  . Heart disease Father   . Hypertension Father   . Diabetes Sister   . Diabetes Brother   . Prostate cancer Brother   . Diabetes Sister   . Kidney disease Neg Hx   . Kidney cancer Neg Hx   . Bladder Cancer Neg Hx   . Colon cancer Neg Hx   . Esophageal cancer Neg Hx   . Inflammatory bowel disease Neg Hx   . Liver disease Neg Hx   . Pancreatic cancer Neg Hx   . Rectal cancer Neg Hx   . Stomach cancer Neg Hx     Social History   Tobacco Use  . Smoking status: Former Smoker    Packs/day: 1.00    Years: 10.00    Pack years: 10.00    Types: Cigarettes    Start date: 03/17/1965    Quit date: 11/27/1975    Years since quitting: 44.7  . Smokeless tobacco: Never Used  . Tobacco comment: quit 40 years  Substance Use Topics  . Alcohol use: Yes    Alcohol/week: 1.0 standard drink    Types: 1 Standard drinks or equivalent per week    Comment: socially - 1 x year     Current Outpatient Medications:  .  ACCU-CHEK FASTCLIX LANCETS MISC, 1 each by Subdermal route 2 (two) times daily., Disp: 102 each, Rfl: 2 .  allopurinol (ZYLOPRIM) 300 MG tablet, Take 1 tablet (300 mg total) by mouth daily., Disp: 90 tablet, Rfl: 1 .  amLODipine (NORVASC) 10 MG tablet, Take 1 tablet (10 mg total) by mouth daily., Disp: 90 tablet, Rfl: 1 .  aspirin EC 81 MG tablet, Take 1 tablet (81 mg total) by mouth daily. Swallow whole., Disp: , Rfl:  .  atorvastatin (LIPITOR) 40 MG tablet, Take 1  tablet (40 mg total) by mouth daily., Disp: 90 tablet, Rfl: 1 .  Blood Glucose Monitoring Suppl (ACCU-CHEK AVIVA PLUS) w/Device KIT, 1 each by Does not apply route 2 (two) times daily. Use as directed, Disp: 100 kit, Rfl: 12 .  glipiZIDE (GLUCOTROL XL) 5 MG 24 hr tablet, Take 1 tablet (5 mg total) by mouth daily with breakfast., Disp: 90 tablet, Rfl: 1 .  glucose blood (ACCU-CHEK AVIVA PLUS) test strip, 1 each by Other route 2 (two) times daily as needed for other. Use as instructed, Disp: 200 strip, Rfl: 2 .  latanoprost (XALATAN) 0.005 % ophthalmic solution,  Place 1 drop into the left eye at bedtime. , Disp: , Rfl:  .  metFORMIN (GLUCOPHAGE-XR) 750 MG 24 hr tablet, Take 2 tablets (1,500 mg total) by mouth daily with breakfast., Disp: 180 tablet, Rfl: 1 .  Omega-3 Fatty Acids (FISH OIL PO), Take 1 capsule by mouth 2 (two) times daily. , Disp: , Rfl:  .  polyethylene glycol (MIRALAX / GLYCOLAX) packet, Take 17 g by mouth at bedtime. , Disp: , Rfl:  .  tadalafil (CIALIS) 10 MG tablet, Take 1-2 tablets (10-20 mg total) by mouth every three (3) days as needed for erectile dysfunction., Disp: 30 tablet, Rfl: 0 .  tamsulosin (FLOMAX) 0.4 MG CAPS capsule, Take 1 capsule (0.4 mg total) by mouth daily., Disp: 90 capsule, Rfl: 1  Allergies  Allergen Reactions  . Ace Inhibitors Other (See Comments)    angioedema    I personally reviewed active problem list, medication list, allergies, family history, social history, health maintenance with the patient/caregiver today.   ROS  Constitutional: Negative for fever or weight change.  Respiratory: Negative for cough and shortness of breath.   Cardiovascular: Negative for chest pain or palpitations.  Gastrointestinal: Negative for abdominal pain, no bowel changes.  Musculoskeletal: Negative for gait problem or joint swelling.  Skin: Negative for rash.  Neurological: Negative for dizziness or headache.  No other specific complaints in a complete review of  systems (except as listed in HPI above)   Objective  Vitals:   08/15/20 1448  BP: 128/68  Pulse: 96  Resp: 16  Temp: 97.9 F (36.6 C)  TempSrc: Oral  SpO2: 98%  Weight: 294 lb (133.4 kg)  Height: '5\' 9"'  (1.753 m)    Body mass index is 43.42 kg/m.  Physical Exam Constitutional: Patient appears well-developed and well-nourished. ObeseNo distress.  HEENT: head atraumatic, normocephalic, pupils equal and reactive to light,  neck supple Cardiovascular: Normal rate, regular rhythm and normal heart sounds.  No murmur heard. No BLE edema. Pulmonary/Chest: Effort normal and breath sounds normal. No respiratory distress. Abdominal: Soft.  There is no tenderness. Psychiatric: Patient has a normal mood and affect. behavior is normal. Judgment and thought content normal. Muscular skeletal: using a cane, crepitus with extension of both knees   Recent Results (from the past 2160 hour(s))  POCT HgB A1C     Status: Abnormal   Collection Time: 08/15/20  2:56 PM  Result Value Ref Range   Hemoglobin A1C 7.2 (A) 4.0 - 5.6 %   HbA1c POC (<> result, manual entry)     HbA1c, POC (prediabetic range)     HbA1c, POC (controlled diabetic range)      PHQ2/9: Depression screen [facility] 2/9 08/15/2020 04/24/2020 12/23/2019 12/01/2019 10/21/2019  Decreased Interest 2 0 2 0 0  Down, Depressed, Hopeless 1 0 1 0 0  PHQ - 2 Score 3 0 3 0 0  Altered sleeping '1 1 2 ' - -  Tired, decreased energy 0 1 2 - -  Change in appetite 0 0 1 - -  Feeling bad or failure about yourself  0 0 1 - -  Trouble concentrating 0 0 1 - -  Moving slowly or fidgety/restless 0 0 0 - -  Suicidal thoughts 0 0 0 - -  PHQ-9 Score '4 2 10 ' - -  Difficult doing work/chores - - Somewhat difficult - -  Some recent data might be hidden    phq 9 is positive   Fall Risk: Fall Risk  08/15/2020 04/24/2020 12/23/2019 12/01/2019  10/21/2019  Falls in the past year? 0 0 0 0 0  Number falls in past yr: 0 0 0 0 -  Injury with Fall? 0 0 0 0 -  Risk for fall  due to : - - - No Fall Risks -  Follow up - - - Falls prevention discussed Falls prevention discussed     Functional Status Survey: Is the patient deaf or have difficulty hearing?: Yes Does the patient have difficulty seeing, even when wearing glasses/contacts?: No Does the patient have difficulty concentrating, remembering, or making decisions?: No Does the patient have difficulty walking or climbing stairs?: Yes Does the patient have difficulty dressing or bathing?: No Does the patient have difficulty doing errands alone such as visiting a doctor's office or shopping?: No    Assessment & Plan  1. Type 2 diabetes mellitus with microalbuminuria, without long-term current use of insulin (HCC)  - POCT HgB A1C  2. Simple chronic bronchitis (HCC)  Stable   3. Obesity, Class III, BMI 40-49.9 (morbid obesity) ([place])  Discussed with the patient the risk posed by an increased BMI. Discussed importance of portion control, calorie counting and at least 150 minutes of physical activity weekly. Avoid sweet beverages and drink more water. Eat at least 6 servings of fruit and vegetables daily   4. Atherosclerosis of aorta ([place])   5. Benign prostatic hyperplasia with nocturia   6. Hypertension associated with diabetes ([place])  Under control   7. 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HCC)  Continue statin therapy and aspirin   8. Controlled gout   9. Primary osteoarthritis of both knees   10. Essential hypertension   11. Claudication of both lower extremities ([place])

## 2020-08-20 ENCOUNTER — Ambulatory Visit: Payer: Medicare HMO | Admitting: Podiatry

## 2020-08-23 ENCOUNTER — Ambulatory Visit: Payer: Medicare HMO | Admitting: Podiatry

## 2020-08-23 ENCOUNTER — Other Ambulatory Visit: Payer: Self-pay

## 2020-08-23 ENCOUNTER — Encounter: Payer: Self-pay | Admitting: Podiatry

## 2020-08-23 DIAGNOSIS — M79676 Pain in unspecified toe(s): Secondary | ICD-10-CM | POA: Diagnosis not present

## 2020-08-23 DIAGNOSIS — E1142 Type 2 diabetes mellitus with diabetic polyneuropathy: Secondary | ICD-10-CM

## 2020-08-23 DIAGNOSIS — N1831 Chronic kidney disease, stage 3a: Secondary | ICD-10-CM

## 2020-08-23 DIAGNOSIS — B351 Tinea unguium: Secondary | ICD-10-CM | POA: Diagnosis not present

## 2020-08-23 DIAGNOSIS — L84 Corns and callosities: Secondary | ICD-10-CM

## 2020-08-23 NOTE — Progress Notes (Signed)
Complaint:  Visit Type: Patient returns to my office for risk foot care.  This patient requires this care by a professional since this patient will be at a high risk due to having diabetes.  This patient is unable to cut his own nails since he cannot reach his nails.  This patient presents for at risk foot care today.  He also states that he has a painful corn on the second toe right foot.  Podiatric Exam: Vascular: dorsalis pedis and posterior tibial pulses are  weakly palpable bilateral. Capillary return is immediate. Temperature gradient is WNL. Skin turgor WNL  Sensorium: Normal Semmes Weinstein monofilament test. Normal tactile sensation bilaterally. Nail Exam: Pt has thick disfigured discolored nails with subungual debris noted bilateral entire nail hallux through fifth toenails.  Nails are thick painful and deformed 1-5  B/L. Ulcer Exam: There is no evidence of ulcer or pre-ulcerative changes or infection. Orthopedic Exam: Muscle tone and strength are WNL. No limitations in general ROM. No crepitus or effusions noted. HAV  B/L. Hammer toes second  B/L Skin: No Porokeratosis. No infection or ulcers.  Corn second toe medial aspect @ PIPJ. Right foot.  Diagnosis:  Onychomycosis, , Pain in right toe, pain in left toes, Corn/callus  Treatment & Plan Procedures and Treatment: Consent by patient was obtained for treatment procedures. The patient understood the discussion of treatment and procedures well. All questions were answered thoroughly reviewed. Debridement of mycotic and hypertrophic toenails, 1 through 5 bilateral and clearing of subungual debris. No ulceration, no infection noted.   Told this patient to return for periodic foot evaluation to help reduce potential of at risk complications.  Gave him  pads due to corn. Return Visit-Office Procedure: Patient instructed to return to the office for a follow up visit 3 months for continued evaluation and treatment.    Gardiner Barefoot DPM

## 2020-08-24 DIAGNOSIS — Z23 Encounter for immunization: Secondary | ICD-10-CM | POA: Diagnosis not present

## 2020-08-29 ENCOUNTER — Ambulatory Visit: Payer: Medicare HMO | Admitting: Family Medicine

## 2020-09-05 ENCOUNTER — Other Ambulatory Visit: Payer: Self-pay | Admitting: Family Medicine

## 2020-09-05 DIAGNOSIS — N521 Erectile dysfunction due to diseases classified elsewhere: Secondary | ICD-10-CM

## 2020-09-05 NOTE — Telephone Encounter (Signed)
   Notes to clinic:  this script has expired  Review for new script   Requested Prescriptions  Pending Prescriptions Disp Refills   ACCU-CHEK AVIVA PLUS test strip [Pharmacy Med Name: ACCU-CHEK AVIVA PLUS   Strip] 200 strip 2    Sig: TEST TWO TIMES DAILY AS NEEDED AS DIRECTED      Endocrinology: Diabetes - Testing Supplies Passed - 09/05/2020  3:01 PM      Passed - Valid encounter within last 12 months    Recent Outpatient Visits           3 weeks ago Type 2 diabetes mellitus with microalbuminuria, without long-term current use of insulin [facility])   [facility] [place], Drue Stager, MD   4 months ago Type 2 diabetes mellitus with microalbuminuria, without long-term current use of insulin [facility])   [facility] [place], Drue Stager, MD   8 months ago Type 2 diabetes mellitus with microalbuminuria, without long-term current use of insulin [facility])   [facility] [place], Drue Stager, MD   1 year ago Type 2 diabetes mellitus with microalbuminuria, without long-term current use of insulin [facility])   [facility] Steele Sizer, MD   1 year ago Hospital discharge follow-up   [facility] Steele Sizer, MD       Future Appointments             In 3 months  [facility], [employer]   In 3 months Steele Sizer, MD [facility], [place]   In 3 months [place], [facility]

## 2020-10-22 DIAGNOSIS — H52223 Regular astigmatism, bilateral: Secondary | ICD-10-CM | POA: Diagnosis not present

## 2020-10-22 DIAGNOSIS — H524 Presbyopia: Secondary | ICD-10-CM | POA: Diagnosis not present

## 2020-11-12 ENCOUNTER — Other Ambulatory Visit: Payer: Self-pay | Admitting: Family Medicine

## 2020-11-12 DIAGNOSIS — E782 Mixed hyperlipidemia: Secondary | ICD-10-CM

## 2020-11-12 DIAGNOSIS — R351 Nocturia: Secondary | ICD-10-CM

## 2020-11-12 DIAGNOSIS — N521 Erectile dysfunction due to diseases classified elsewhere: Secondary | ICD-10-CM

## 2020-11-12 DIAGNOSIS — M109 Gout, unspecified: Secondary | ICD-10-CM

## 2020-11-12 DIAGNOSIS — N401 Enlarged prostate with lower urinary tract symptoms: Secondary | ICD-10-CM

## 2020-11-12 DIAGNOSIS — I10 Essential (primary) hypertension: Secondary | ICD-10-CM

## 2020-11-12 DIAGNOSIS — E1140 Type 2 diabetes mellitus with diabetic neuropathy, unspecified: Secondary | ICD-10-CM

## 2020-11-13 ENCOUNTER — Other Ambulatory Visit: Payer: Self-pay

## 2020-11-13 DIAGNOSIS — N521 Erectile dysfunction due to diseases classified elsewhere: Secondary | ICD-10-CM

## 2020-11-13 DIAGNOSIS — E1140 Type 2 diabetes mellitus with diabetic neuropathy, unspecified: Secondary | ICD-10-CM

## 2020-11-13 MED ORDER — ACCU-CHEK FASTCLIX LANCETS MISC: 1.0000 | Freq: Two times a day (BID) | 2 refills | Status: DC

## 2020-11-15 ENCOUNTER — Ambulatory Visit: Payer: Medicare HMO | Admitting: Podiatry

## 2020-11-15 ENCOUNTER — Other Ambulatory Visit: Payer: Self-pay

## 2020-11-15 ENCOUNTER — Encounter: Payer: Self-pay | Admitting: Podiatry

## 2020-11-15 DIAGNOSIS — L84 Corns and callosities: Secondary | ICD-10-CM

## 2020-11-15 DIAGNOSIS — M79676 Pain in unspecified toe(s): Secondary | ICD-10-CM | POA: Diagnosis not present

## 2020-11-15 DIAGNOSIS — N1831 Chronic kidney disease, stage 3a: Secondary | ICD-10-CM | POA: Diagnosis not present

## 2020-11-15 DIAGNOSIS — I739 Peripheral vascular disease, unspecified: Secondary | ICD-10-CM

## 2020-11-15 DIAGNOSIS — B351 Tinea unguium: Secondary | ICD-10-CM | POA: Diagnosis not present

## 2020-11-15 DIAGNOSIS — E1142 Type 2 diabetes mellitus with diabetic polyneuropathy: Secondary | ICD-10-CM

## 2020-11-15 NOTE — Progress Notes (Addendum)
Complaint:  Visit Type: Patient returns to my office for risk foot care.  This patient requires this care by a professional since this patient will be at a high risk due to having diabetes.  This patient is unable to cut his own nails since he cannot reach his nails.  This patient presents for at risk foot care today.  He also states that he has a painful corn on the second toe right foot.  Podiatric Exam: Vascular: dorsalis pedis and posterior tibial pulses are  weakly palpable bilateral. Capillary return is immediate. Temperature gradient is WNL. Skin turgor WNL  Sensorium: Normal Semmes Weinstein monofilament test. Normal tactile sensation bilaterally. Nail Exam: Pt has thick disfigured discolored nails with subungual debris noted bilateral entire nail hallux through fifth toenails.  Nails are thick painful and deformed 1-5  B/L. Ulcer Exam: There is no evidence of ulcer or pre-ulcerative changes or infection. Orthopedic Exam: Muscle tone and strength are WNL. No limitations in general ROM. No crepitus or effusions noted. HAV  B/L. Hammer toes second  B/L  Palpable pain under IPJ left hallux. Skin: No Porokeratosis. No infection or ulcers.  Corn second toe medial aspect @ PIPJ. Right foot.  Diagnosis:  Onychomycosis, , Pain in right toe, pain in left toes, Corn/callus  Treatment & Plan Procedures and Treatment: Consent by patient was obtained for treatment procedures. The patient understood the discussion of treatment and procedures well. All questions were answered thoroughly reviewed. Debridement of mycotic and hypertrophic toenails, 1 through 5 bilateral and clearing of subungual debris. No ulceration, no infection noted.   Told this patient to return for periodic foot evaluation to help reduce potential of at risk complications.  Gave him  pads due to corn.  He was upset I only had two pads to give him instead of three. Return Visit-Office Procedure: Patient instructed to return to the office  for a follow up visit 3 months for continued evaluation and treatment.    Gardiner Barefoot DPM

## 2020-12-04 ENCOUNTER — Ambulatory Visit: Payer: Medicare HMO

## 2020-12-05 ENCOUNTER — Telehealth: Payer: Self-pay | Admitting: Family Medicine

## 2020-12-05 NOTE — Telephone Encounter (Signed)
Copied from [place] [id number]. Topic: Medicare AWV >> Dec 05, 2020 12:31 PM Cher Nakai R wrote: Reason for CRM:  Left message for patient to call back and schedule Medicare Annual Wellness Visit (AWV) in office.   If unable to come into the office for AWV,  please offer to do virtually or by telephone.  Last AWV: 12/01/2019  Please schedule at anytime with [place].  40 minute appointment  Any questions, please contact me at [phone]

## 2020-12-10 ENCOUNTER — Telehealth: Payer: Self-pay | Admitting: Family Medicine

## 2020-12-10 NOTE — Telephone Encounter (Signed)
Copied from [place] [id number]. Topic: Medicare AWV >> Dec 10, 2020  1:35 PM Cher Nakai R wrote: Reason for CRM:  Left message for patient to call back and schedule Medicare Annual Wellness Visit (AWV) in office.   If unable to come into the office for AWV,  please offer to do virtually or by telephone.  Last AWV: 12/01/2019  Please schedule at anytime with [place].  40 minute appointment  Any questions, please contact me at [phone]

## 2020-12-17 NOTE — Progress Notes (Deleted)
Name: Jordan [name]   MRN: [id number]    DOB: [birth year]   Date:12/17/2020       Progress Note  Subjective  Chief Complaint  Follow Up  HPI  DM with microalbuminuria , obesity and HTN and  ED: he is back on Glipizide 29m ER and still on Metformin one daily  HgbA1C used to be above 9% but he has changed his diet, walking [place] the mall. His A1C has been stable past two times at 7.2 %    Cialis  is working well and uses prn for ED, he states sometimes but other times does not work, advised to discuss it with [facility]. He cannot take ACE because of history of angioedema, bp is controlled with Norvasc.  He denies polyphagia, polydipsia but has polyuria - also has BPH He denies hypoglycemic episodes lately, fasting sugar around 140's when he gets up in the mornings.    Chronic bronchitis: he used to smoke but quit many years ago, he still has a cough that is productive at times, coughs iin the mornings and has a pale yellow sputum, he denies  wheezing  He has some sob with activity but stable over the years.  He does not want any inhalers . He states no significant variation with weather changes    BPH: [place] are [place], on Flomax now . He still has nocturia 2-3 times per night , stable.    HTN:  he is compliant with mediations, bp is at goal, no chest pain or dizziness.  He has lower extremity edema but still stable. He takes norvasc and may be part of his problem   Hyperlipidemia: taking lipitor and denies side effects, no myalgia. Reviewed last last LDL and it was at goal at 60. He is compliant    Obesity : his weight has been stable over the past year, down two pounds today He has been walking and being more careful with his diet    Aortic Valve stenosis: under the care of cardiologist,   no syncope or chest pain, had echo normal EF. 50-55 %, Echo showed ascending aorta dilation. Denies orthopnea   Atherosclerosis of aorta: he  is on statin therapy, taking aspirin 81 mg daily and no side effects . Unchanged    History of anemia: secondary to rectal bleeding, last HCT back to normal, no recent episode of rectal bleeding . Unchanged    Gout:taking allopurinol and not on diuretics -no flares in a long time . Last uric acid was normal   Claudication: going on for months, used to walk for one hour but now after 30 minutes he needs to stop to rest because legs feels heavy and aching, once he stops he is able to walk again . He was seen by vascular surgeon and felt likely secondary from lumbar spine problems. Discussed risk and benefits of therapy for lumbar spine stenosis and since symptoms only when walking he will continue physical activity and stop to take breaks as needed . He states symptoms have been stable   Gait problems: he has OA, he states symptoms have been stable, he uses a cane most of the time, occasionally uses a walker, he is still able to walk daily and is not feeling upset about not being able to be as active.   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[place] 04/24/2020   Claudication ([place] 01/17/2020  Diverticulosis of colon with hemorrhage 05/26/2019   History of anemia 05/26/2019   Atherosclerosis of aorta ([place]) 04/26/2019   Rectal bleed 04/22/2019   Rectal bleeding 04/20/2019   Chronic bronchitis (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   Aortic valve stenosis 12/02/2012    Constipation 10/26/2012   Incisional hernia, without obstruction or gangrene 10/26/2012   Left inguinal hernia     Past Surgical History:  Procedure Laterality Date   cataract surgery     COLONOSCOPY  2012   COLONOSCOPY N/A 04/22/2019   Procedure: COLONOSCOPY;  Surgeon: Irving Copas., MD;  Location: [facility];  Service: Gastroenterology;  Laterality: N/A;   EYE SURGERY  2011   cataract   UMBILICAL HERNIA REPAIR  2011    Family History  Problem Relation Age of Onset   Heart disease Father    Hypertension Father    Diabetes Sister    Diabetes Brother    Prostate cancer Brother    Diabetes Sister    Kidney disease Neg Hx    Kidney cancer Neg Hx    Bladder Cancer Neg Hx    Colon cancer Neg Hx    Esophageal cancer Neg Hx    Inflammatory bowel disease Neg Hx    Liver disease Neg Hx    Pancreatic cancer Neg Hx    Rectal cancer Neg Hx    Stomach cancer Neg Hx     Social History   Tobacco Use   Smoking status: Former    Packs/day: 1.00    Years: 10.00    Pack years: 10.00    Types: Cigarettes    Start date: 03/17/1965    Quit date: 11/27/1975    Years since quitting: 45.0   Smokeless tobacco: Never   Tobacco comments:    quit 40 years  Substance Use Topics   Alcohol use: Yes    Alcohol/week: 1.0 standard drink    Types: 1 Standard drinks or equivalent per week    Comment: socially - 1 x year     Current Outpatient Medications:    ACCU-CHEK AVIVA PLUS test strip, TEST TWO TIMES DAILY AS NEEDED AS DIRECTED, Disp: 200 strip, Rfl: 2   Accu-Chek FastClix Lancets MISC, 1 each by Subdermal route 2 (two) times daily., Disp: 102 each, Rfl: 2   allopurinol (ZYLOPRIM) 300 MG tablet, TAKE 1 TABLET EVERY DAY, Disp: 90 tablet, Rfl: 1   amLODipine (NORVASC) 10 MG tablet, TAKE 1 TABLET EVERY DAY, Disp: 90 tablet, Rfl: 1   aspirin EC 81 MG tablet, Take 1 tablet (81 mg total) by mouth daily. Swallow whole., Disp: , Rfl:    atorvastatin (LIPITOR) 40 MG tablet, TAKE 1  TABLET EVERY DAY, Disp: 90 tablet, Rfl: 1   Blood Glucose Monitoring Suppl (ACCU-CHEK AVIVA PLUS) w/Device KIT, 1 each by Does not apply route 2 (two) times daily. Use as directed, Disp: 100 kit, Rfl: 12   glipiZIDE (GLUCOTROL XL) 5 MG 24 hr tablet, TAKE 1 TABLET (5 MG TOTAL) BY MOUTH DAILY WITH BREAKFAST., Disp: 90 tablet, Rfl: 1   latanoprost (  XALATAN) 0.005 % ophthalmic solution, Place 1 drop into the left eye at bedtime. , Disp: , Rfl:    metFORMIN (GLUCOPHAGE-XR) 750 MG 24 hr tablet, TAKE 2 TABLETS (1,500 MG TOTAL) BY MOUTH DAILY WITH BREAKFAST., Disp: 180 tablet, Rfl: 1   Omega-3 Fatty Acids (FISH OIL PO), Take 1 capsule by mouth 2 (two) times daily. , Disp: , Rfl:    polyethylene glycol (MIRALAX / GLYCOLAX) packet, Take 17 g by mouth at bedtime. , Disp: , Rfl:    tadalafil (CIALIS) 10 MG tablet, Take 1-2 tablets (10-20 mg total) by mouth every three (3) days as needed for erectile dysfunction., Disp: 30 tablet, Rfl: 0   tamsulosin (FLOMAX) 0.4 MG CAPS capsule, TAKE 1 CAPSULE EVERY DAY, Disp: 90 capsule, Rfl: 1  Allergies  Allergen Reactions   Ace Inhibitors Other (See Comments)    angioedema    I personally reviewed {Reviewed:14835} with the patient/caregiver today.   ROS  ***  Objective  There were no vitals filed for this visit.  There is no height or weight on file to calculate BMI.  Physical Exam ***  No results found for this or any previous visit (from the past 2160 hour(s)).  Diabetic Foot Exam: Diabetic Foot Exam - Simple   No data filed    ***  PHQ2/9: Depression screen [facility] 2/9 08/15/2020 04/24/2020 12/23/2019 12/01/2019 10/21/2019  Decreased Interest 2 0 2 0 0  Down, Depressed, Hopeless 1 0 1 0 0  PHQ - 2 Score 3 0 3 0 0  Altered sleeping '1 1 2 ' - -  Tired, decreased energy 0 1 2 - -  Change in appetite 0 0 1 - -  Feeling bad or failure about yourself  0 0 1 - -  Trouble concentrating 0 0 1 - -  Moving slowly or fidgety/restless 0 0 0 - -  Suicidal thoughts  0 0 0 - -  PHQ-9 Score '4 2 10 ' - -  Difficult doing work/chores - - Somewhat difficult - -  Some recent data might be hidden    phq 9 is {gen pos [id number]} ***  Fall Risk: Fall Risk  08/15/2020 04/24/2020 12/23/2019 12/01/2019 10/21/2019  Falls in the past year? 0 0 0 0 0  Number falls in past yr: 0 0 0 0 -  Injury with Fall? 0 0 0 0 -  Risk for fall due to : - - - No Fall Risks -  Follow up - - - Falls prevention discussed Falls prevention discussed   ***   Functional Status Survey:   ***   Assessment & Plan  *** There are no diagnoses linked to this encounter.

## 2020-12-18 ENCOUNTER — Ambulatory Visit: Payer: Medicare HMO | Admitting: Family Medicine

## 2020-12-18 DIAGNOSIS — E1129 Type 2 diabetes mellitus with other diabetic kidney complication: Secondary | ICD-10-CM

## 2020-12-18 NOTE — Progress Notes (Signed)
12/19/2020 3:02 PM   Jordan [name] 01/19/1941 [id number]  Referring provider: Steele Sizer, MD [address] [place],  [place] [address]  Chief Complaint  Patient presents with   Benign Prostatic Hypertrophy    Urological history: 1.  Phimosis -Unable to retract foreskin  2. BPH with LU TS -prostate volume 101 cc on contrast CT 04/2019 -I PSS 22/2 -PVR 10 mL -managed with tamsulosin 0.4 mg daily   3. Family history of prostate cancer -brother has a history of non fatal prostate cancer.  HPI: Jordan [name] is a 80 y.o. male who presents today for yearly visit.  He continues to have nocturia x 4.  He drinks swigs of water during the night to get the "bad taste" in his mouth.  Patient denies any modifying or aggravating factors.  Patient denies any gross hematuria, dysuria or suprapubic/flank pain.  Patient denies any fevers, chills, nausea or vomiting.     IPSS     Row Name 12/19/20 1400         International Prostate Symptom Score   How often have you had the sensation of not emptying your bladder? More than half the time     How often have you had to urinate less than every two hours? About half the time     How often have you found you stopped and started again several times when you urinated? More than half the time     How often have you found it difficult to postpone urination? Almost always     How often have you had a weak urinary stream? Less than half the time     How often have you had to strain to start urination? Not at All     How many times did you typically get up at night to urinate? 4 Times     Total IPSS Score 22           Quality of Life due to urinary symptoms   If you were to spend the rest of your life with your urinary condition just the way it is now how would you feel about that? Mostly Satisfied               Score:  1-7 Mild 8-19 Moderate 20-35 Severe  PMH: Past Medical History:  Diagnosis Date    Aortic stenosis    Chronic kidney disease, stage II (mild)    Decreased libido    Diabetes mellitus without complication ([place])    Glaucoma    Gout 2009   Hernia 9767,3419   Hyperlipidemia    Hypertension    Inguinal hernia without mention of obstruction or gangrene, unilateral or unspecified, (not specified as recurrent)    Lumbago    Obesity    Unspecified constipation     Surgical History: Past Surgical History:  Procedure Laterality Date   cataract surgery     COLONOSCOPY  2012   COLONOSCOPY N/A 04/22/2019   Procedure: COLONOSCOPY;  Surgeon: Mansouraty, Telford Nab., MD;  Location: [facility];  Service: Gastroenterology;  Laterality: N/A;   EYE SURGERY  2011   cataract   UMBILICAL HERNIA REPAIR  2011    Home Medications:  Allergies as of 12/19/2020       Reactions   Ace Inhibitors Other (See Comments)   angioedema        Medication List        Accurate as of December 19, 2020  3:02 PM. If  you have any questions, ask your nurse or doctor.          Accu-Chek Aviva Plus test strip Generic drug: glucose blood TEST TWO TIMES DAILY AS NEEDED AS DIRECTED   Accu-Chek Aviva Plus w/Device Kit 1 each by Does not apply route 2 (two) times daily. Use as directed   Accu-Chek FastClix Lancets Misc 1 each by Subdermal route 2 (two) times daily.   allopurinol 300 MG tablet Commonly known as: ZYLOPRIM TAKE 1 TABLET EVERY DAY   amLODipine 10 MG tablet Commonly known as: NORVASC TAKE 1 TABLET EVERY DAY   aspirin EC 81 MG tablet Take 1 tablet (81 mg total) by mouth daily. Swallow whole.   atorvastatin 40 MG tablet Commonly known as: LIPITOR TAKE 1 TABLET EVERY DAY   FISH OIL PO Take 1 capsule by mouth 2 (two) times daily.   glipiZIDE 5 MG 24 hr tablet Commonly known as: GLUCOTROL XL TAKE 1 TABLET (5 MG TOTAL) BY MOUTH DAILY WITH BREAKFAST.   latanoprost 0.005 % ophthalmic solution Commonly known as: XALATAN Place 1 drop into the left eye at bedtime.    metFORMIN 750 MG 24 hr tablet Commonly known as: GLUCOPHAGE-XR TAKE 2 TABLETS (1,500 MG TOTAL) BY MOUTH DAILY WITH BREAKFAST.   polyethylene glycol 17 g packet Commonly known as: MIRALAX / GLYCOLAX Take 17 g by mouth at bedtime.   tadalafil 10 MG tablet Commonly known as: CIALIS Take 1-2 tablets (10-20 mg total) by mouth every three (3) days as needed for erectile dysfunction.   tamsulosin 0.4 MG Caps capsule Commonly known as: FLOMAX TAKE 1 CAPSULE EVERY DAY        Allergies:  Allergies  Allergen Reactions   Ace Inhibitors Other (See Comments)    angioedema    Family History: Family History  Problem Relation Age of Onset   Heart disease Father    Hypertension Father    Diabetes Sister    Diabetes Brother    Prostate cancer Brother    Diabetes Sister    Kidney disease Neg Hx    Kidney cancer Neg Hx    Bladder Cancer Neg Hx    Colon cancer Neg Hx    Esophageal cancer Neg Hx    Inflammatory bowel disease Neg Hx    Liver disease Neg Hx    Pancreatic cancer Neg Hx    Rectal cancer Neg Hx    Stomach cancer Neg Hx     Social History:  reports that he quit smoking about 45 years ago. His smoking use included cigarettes. He started smoking about 55 years ago. He has a 10.00 pack-year smoking history. He has never used smokeless tobacco. He reports current alcohol use of about 1.0 standard drink per week. He reports that he does not use drugs.  ROS: For pertinent review of systems please refer to history of present illness  Physical Exam: BP 137/86   Pulse 99   Ht $R'5\' 9"'mA$  (1.753 m)   Wt 298 lb (135.2 kg)   BMI 44.01 kg/m   Constitutional:  Well nourished. Alert and oriented, No acute distress. HEENT: [place] AT, mask in place.  Trachea midline Cardiovascular: No clubbing, cyanosis, or edema. Respiratory: Normal respiratory effort, no increased work of breathing. GU: No CVA tenderness.  No bladder fullness or masses.  Patient with uncircumcised phallus. Fore skin  could not be easily retracted  Urethral meatus is patent.  No penile discharge. No penile lesions or rashes. Scrotum without lesions, cysts, rashes and/or  edema.  Testicles are located scrotally bilaterally. No masses are appreciated in the testicles. Left and right epididymis are normal. Rectal: Patient with  normal sphincter tone. Anus and perineum without scarring or rashes. No rectal masses are appreciated. Prostate is approximately 50 + grams, could only palpate the apex and the midportion of the gland, no nodules are appreciated. Seminal vesicles could not be palpated Neurologic: Grossly intact, no focal deficits, moving all 4 extremities. Psychiatric: Normal mood and affect.   Laboratory Data: Lab Results  Component Value Date   HGBA1C 7.2 (A) 08/15/2020  I have reviewed the labs  Pertinent imaging  Results for MILLEDGE, GERDING (MRN [id number]) as of 12/19/2020 14:47  Ref. Range 12/19/2020 14:18  Scan Result Unknown 74m    Assessment & Plan:    1. Phimosis -not bothersome to the patient -does not interfere with urination  2. BPH with LUTS -DRE benign -PVR < 300 cc -symptoms - nocturia -likely due to untreated sleep apnea and consuming water during the night -Continue tamsulosin 0.4 mg daily    Return in about 1 year (around 12/19/2021) for IPSS and PVR.  These notes generated with voice recognition software. I apologize for typographical errors.  SZara Council PA-C  [facility] [address]inden[place]ouglass [place] [phone]

## 2020-12-19 ENCOUNTER — Encounter: Payer: Self-pay | Admitting: Urology

## 2020-12-19 ENCOUNTER — Other Ambulatory Visit: Payer: Self-pay

## 2020-12-19 ENCOUNTER — Ambulatory Visit: Payer: Medicare HMO | Admitting: Urology

## 2020-12-19 VITALS — BP 137/86 | HR 99 | Ht 69.0 in | Wt 298.0 lb

## 2020-12-19 DIAGNOSIS — N401 Enlarged prostate with lower urinary tract symptoms: Secondary | ICD-10-CM

## 2020-12-19 DIAGNOSIS — N471 Phimosis: Secondary | ICD-10-CM | POA: Diagnosis not present

## 2020-12-19 DIAGNOSIS — N138 Other obstructive and reflux uropathy: Secondary | ICD-10-CM | POA: Diagnosis not present

## 2020-12-19 LAB — BLADDER SCAN AMB NON-IMAGING

## 2020-12-27 DIAGNOSIS — Z23 Encounter for immunization: Secondary | ICD-10-CM | POA: Diagnosis not present

## 2021-01-03 ENCOUNTER — Ambulatory Visit: Payer: Medicare HMO

## 2021-01-16 ENCOUNTER — Other Ambulatory Visit: Payer: Self-pay

## 2021-01-16 ENCOUNTER — Ambulatory Visit (INDEPENDENT_AMBULATORY_CARE_PROVIDER_SITE_OTHER): Payer: Medicare HMO | Admitting: Family Medicine

## 2021-01-16 ENCOUNTER — Encounter: Payer: Self-pay | Admitting: Family Medicine

## 2021-01-16 VITALS — BP 124/72 | HR 88 | Temp 97.8°F | Resp 16 | Ht 69.0 in | Wt 296.0 lb

## 2021-01-16 DIAGNOSIS — I77810 Thoracic aortic ectasia: Secondary | ICD-10-CM | POA: Diagnosis not present

## 2021-01-16 DIAGNOSIS — J410 Simple chronic bronchitis: Secondary | ICD-10-CM | POA: Diagnosis not present

## 2021-01-16 DIAGNOSIS — I152 Hypertension secondary to endocrine disorders: Secondary | ICD-10-CM

## 2021-01-16 DIAGNOSIS — I700 Atherosclerosis of aorta: Secondary | ICD-10-CM | POA: Diagnosis not present

## 2021-01-16 DIAGNOSIS — Z23 Encounter for immunization: Secondary | ICD-10-CM

## 2021-01-16 DIAGNOSIS — I739 Peripheral vascular disease, unspecified: Secondary | ICD-10-CM

## 2021-01-16 DIAGNOSIS — I350 Nonrheumatic aortic (valve) stenosis: Secondary | ICD-10-CM | POA: Diagnosis not present

## 2021-01-16 DIAGNOSIS — E1129 Type 2 diabetes mellitus with other diabetic kidney complication: Secondary | ICD-10-CM | POA: Diagnosis not present

## 2021-01-16 DIAGNOSIS — I10 Essential (primary) hypertension: Secondary | ICD-10-CM

## 2021-01-16 DIAGNOSIS — K625 Hemorrhage of anus and rectum: Secondary | ICD-10-CM | POA: Diagnosis not present

## 2021-01-16 DIAGNOSIS — K649 Unspecified hemorrhoids: Secondary | ICD-10-CM

## 2021-01-16 DIAGNOSIS — M109 Gout, unspecified: Secondary | ICD-10-CM

## 2021-01-16 DIAGNOSIS — N401 Enlarged prostate with lower urinary tract symptoms: Secondary | ICD-10-CM

## 2021-01-16 DIAGNOSIS — E1159 Type 2 diabetes mellitus with other circulatory complications: Secondary | ICD-10-CM | POA: Diagnosis not present

## 2021-01-16 DIAGNOSIS — R809 Proteinuria, unspecified: Secondary | ICD-10-CM | POA: Diagnosis not present

## 2021-01-16 DIAGNOSIS — R351 Nocturia: Secondary | ICD-10-CM

## 2021-01-16 LAB — POCT GLYCOSYLATED HEMOGLOBIN (HGB A1C): Hemoglobin A1C: 6.9 % — AB (ref 4.0–5.6)

## 2021-01-16 NOTE — Progress Notes (Signed)
Name: Jordan [name]   MRN: [id number]    DOB: Sep 18, 1940   Date:01/16/2021       Progress Note  Subjective  Chief Complaint  Follow Up  HPI  DM with microalbuminuria , obesity, dyslipidemia and HTN and  ED: he is back on Glipizide 50m ER and still on Metformin one daily  HgbA1C used to be above 9% but he has changed his diet, walking 5 days a week for 30 minutes ( with breaks every 10 minutes)  [facility]Marland KitchenHis A1C has been stable last two times at  7.2 %  and today at 6.9 %   Cialis  is working well and uses prn for ED, he states sometimes but other times does not work, he forgot to discuss it with Urologist  He cannot take ACE because of history of angioedema, bp is controlled with Norvasc.  He denies polyphagia, polydipsia but has polyuria - also has BPH He denies hypoglycemic episodes lately, fasting sugar around 140's most of the time    Chronic bronchitis: he used to smoke but quit many years ago, he still has a cough that is productive at times, coughs in the mornings and has a pale yellow sputum, he denies  wheezing  He has some sob with activity that seems a little worse latelyHe does not want any inhalers . He states no significant variation with weather changes    [place] Fl[place] now . He still has nocturia 2-3 times per night . Unchanged    HTN:  he is compliant with mediations, bp is at goal, no chest pain , he denies orthostatic changes or dizzines .  He has lower extremity edema but still stable.    Hyperlipidemia: taking lipitor and denies side effects, no myalgia. Reviewed last last LDL and it was at goal at 60. He is due for repeat labs today    Obesity : his weight has been stable over the past year, down two more pounds today He has been walking and being more careful with his diet    Aortic Valve stenosis: under the care of cardiologist - Dr. GRockey Situ   no syncope or chest pain, had echo normal EF. 50-55 %,  Echo showed ascending aorta dilation. He has noticed increase in SOB over the past couple of months and on last Echo Dr. GRockey Situsuggested to have a repeat transesophageal echo and I will refer him back to him since stenosis was worse    Atherosclerosis of aorta: he is on statin therapy, taking aspirin 81 mg daily and no side effects .We will recheck labs today    History of anemia: secondary to rectal bleeding, last HCT back to normal, he noticed some blood in toilet bowel a couple of weeks ago after a bowel movement, it lasted about one week. He did not reached out to his GI   Gout:taking allopurinol and not on diuretics -no flares in a long time . Last uric acid was normal   Claudication: going on for months, used to walk for one hour but now after 30 minutes he needs to stop to rest because legs feels heavy and aching, once he stops he is able to walk again . He was seen by vascular surgeon and felt likely secondary from lumbar spine problems. Discussed risk and benefits of therapy for lumbar spine stenosis and since symptoms only when walking he will continue physical activity and stop to  take breaks as needed . 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 ([place]) 11/07/2014   Hyperlipidemia 11/07/2014   Essential hypertension 11/07/2014   Obesity, Class III, BMI 40-49.9 (morbid obesity) ([place]) 11/07/2014   Gout 11/07/2014   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   Epistaxis 09/21/2014   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   Aortic valve stenosis 12/02/2012   Constipation 10/26/2012   Incisional hernia, without obstruction or gangrene 10/26/2012   Left inguinal hernia     Past Surgical History:  Procedure Laterality Date   cataract surgery     COLONOSCOPY  2012   COLONOSCOPY N/A 04/22/2019   Procedure: COLONOSCOPY;  Surgeon: Irving Copas., MD;  Location: [facility];  Service: Gastroenterology;  Laterality: N/A;   EYE SURGERY  2011   cataract   UMBILICAL HERNIA REPAIR  2011    Family History  Problem Relation Age of Onset   Heart disease Father    Hypertension Father    Diabetes Sister    Diabetes Brother    Prostate cancer Brother    Diabetes Sister    Kidney disease Neg Hx    Kidney cancer Neg Hx    Bladder Cancer Neg Hx    Colon cancer Neg Hx    Esophageal cancer Neg Hx    Inflammatory bowel disease Neg Hx    Liver disease Neg Hx    Pancreatic cancer Neg Hx    Rectal cancer Neg Hx    Stomach cancer Neg Hx     Social History   Tobacco Use   Smoking status: Former    Packs/day: 1.00    Years: 10.00    Pack years: 10.00    Types: Cigarettes    Start date: 03/17/1965    Quit date: 11/27/1975    Years since quitting: 45.1   Smokeless tobacco: Never   Tobacco comments:    quit 40 years  Substance Use Topics   Alcohol use: Yes    Alcohol/week: 1.0 standard drink    Types: 1 Standard drinks or equivalent per week    Comment: socially - 1 x year     Current Outpatient Medications:    ACCU-CHEK AVIVA PLUS test strip, TEST TWO TIMES DAILY AS NEEDED AS DIRECTED, Disp: 200 strip, Rfl: 2   Accu-Chek FastClix Lancets MISC, 1 each by  Subdermal route 2 (two) times daily., Disp: 102 each, Rfl: 2   allopurinol (ZYLOPRIM) 300 MG tablet, TAKE 1 TABLET EVERY DAY, Disp: 90 tablet, Rfl: 1   amLODipine (NORVASC) 10 MG tablet, TAKE 1 TABLET EVERY DAY, Disp: 90 tablet, Rfl: 1   aspirin EC 81 MG tablet, Take 1 tablet (81 mg total)  by mouth daily. Swallow whole., Disp: , Rfl:    atorvastatin (LIPITOR) 40 MG tablet, TAKE 1 TABLET EVERY DAY, Disp: 90 tablet, Rfl: 1   Blood Glucose Monitoring Suppl (ACCU-CHEK AVIVA PLUS) w/Device KIT, 1 each by Does not apply route 2 (two) times daily. Use as directed, Disp: 100 kit, Rfl: 12   glipiZIDE (GLUCOTROL XL) 5 MG 24 hr tablet, TAKE 1 TABLET (5 MG TOTAL) BY MOUTH DAILY WITH BREAKFAST., Disp: 90 tablet, Rfl: 1   latanoprost (XALATAN) 0.005 % ophthalmic solution, Place 1 drop into the left eye at bedtime. , Disp: , Rfl:    metFORMIN (GLUCOPHAGE-XR) 750 MG 24 hr tablet, TAKE 2 TABLETS (1,500 MG TOTAL) BY MOUTH DAILY WITH BREAKFAST., Disp: 180 tablet, Rfl: 1   Omega-3 Fatty Acids (FISH OIL PO), Take 1 capsule by mouth 2 (two) times daily. , Disp: , Rfl:    polyethylene glycol (MIRALAX / GLYCOLAX) packet, Take 17 g by mouth at bedtime. , Disp: , Rfl:    tadalafil (CIALIS) 10 MG tablet, Take 1-2 tablets (10-20 mg total) by mouth every three (3) days as needed for erectile dysfunction., Disp: 30 tablet, Rfl: 0   tamsulosin (FLOMAX) 0.4 MG CAPS capsule, TAKE 1 CAPSULE EVERY DAY, Disp: 90 capsule, Rfl: 1  Allergies  Allergen Reactions   Ace Inhibitors Other (See Comments)    angioedema    I personally reviewed active problem list, medication list, allergies, family history, social history, health maintenance with the patient/caregiver today.   ROS  Constitutional: Negative for fever or weight change.  Respiratory: positive  for cough and  increase of shortness of breath.   Cardiovascular: Negative for chest pain or palpitations.  Gastrointestinal: Negative for abdominal pain, no bowel changes.   Musculoskeletal: Positive  for gait problem but no  joint swelling.  Skin: Negative for rash.  Neurological: Negative for dizziness or headache.  No other specific complaints in a complete review of systems (except as listed in HPI above).   Objective  Vitals:   01/16/21 1045  BP: 124/72  Pulse: 88  Resp: 16  Temp: 97.8 F (36.6 C)  SpO2: 96%  Weight: 296 lb (134.3 kg)  Height: _0  (1.753 m)    Body mass index is 43.71 kg/m.  Physical Exam  Constitutional: Patient appears well-developed and well-nourished. Obese  No distress.  HEENT: head atraumatic, normocephalic, pupils equal and reactive to light, neck supple Cardiovascular: Normal rate, regular rhythm and normal heart sounds.  No murmur heard. No BLE edema. Muscular Skeletal: crepitus with extension of both knees, no effusions  Pulmonary/Chest: Effort normal and breath sounds normal. No respiratory distress. Abdominal: Soft.  There is no tenderness. Psychiatric: Patient has a normal mood and affect. behavior is normal. Judgment and thought content normal.    Diabetic Foot Exam: Diabetic Foot Exam - Simple   Simple Foot Form Visual Inspection No deformities, no ulcerations, no other skin breakdown bilaterally: Yes Sensation Testing Intact to touch and monofilament testing bilaterally: Yes Pulse Check Posterior Tibialis and Dorsalis pulse intact bilaterally: Yes Comments      PHQ2/9: Depression screen [facility] 2/9 01/16/2021 08/15/2020 04/24/2020 12/23/2019 12/01/2019  Decreased Interest 0 2 0 2 0  Down, Depressed, Hopeless 0 1 0 1 0  PHQ - 2 Score 0 3 0 3 0  Altered sleeping 0 _1 -  Tired, decreased energy 3 0 1 2 -  Change in appetite 0 0 0 1 -  Feeling bad or failure about yourself  0 0 0 1 -  Trouble concentrating 0 0 0 1 -  Moving slowly or fidgety/restless 0 0 0 0 -  Suicidal thoughts 0 0 0 0 -  PHQ-9 Score _0 -  Difficult doing work/chores - - - Somewhat difficult -  Some recent data might be  hidden    phq 9 is negative   Fall Risk: Fall Risk  01/16/2021 08/15/2020 04/24/2020 12/23/2019 12/01/2019  Falls in the past year? 0 0 0 0 0  Number falls in past yr: 0 0 0 0 0  Injury with Fall? 0 0 0 0 0  Risk for fall due to : No Fall Risks - - - No Fall Risks  Follow up Falls prevention discussed - - - Falls prevention discussed      Functional Status Survey: Is the patient deaf or have difficulty hearing?: Yes Does the patient have difficulty seeing, even when wearing glasses/contacts?: No Does the patient have difficulty concentrating, remembering, or making decisions?: No Does the patient have difficulty walking or climbing stairs?: Yes Does the patient have difficulty dressing or bathing?: No Does the patient have difficulty doing errands alone such as visiting a doctor's office or shopping?: No    Assessment & Plan  1. Type 2 diabetes mellitus with microalbuminuria, without long-term current use of insulin (HCC)  - POCT HgB A1C - HM Diabetes Foot Exam - Microalbumin / creatinine urine ratio  2. Atherosclerosis of aorta (HCC)  - Lipid panel  3. 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HCC)   4. Simple chronic bronchitis (HCC)   5. Obesity, Class III, BMI 40-49.9 (morbid obesity) ([place])  Discussed with the patient the risk posed by an increased BMI. Discussed importance of portion control, calorie counting and at least 150 minutes of physical activity weekly. Avoid sweet beverages and drink more water. Eat at least 6 servings of fruit and vegetables daily    6. Hypertension associated with diabetes ([place])  - COMPLETE METABOLIC PANEL WITH GFR  7. Need for immunization against influenza  - Flu Vaccine QUAD High Dose(Fluad)  8. Claudication of both lower extremities ([place])   9. Aortic valve stenosis, etiology of cardiac valve disease unspecified  - Ambulatory referral to Cardiology  10. Essential hypertension   11. Controlled gout   12. Benign prostatic hyperplasia  with nocturia   13. Rectal bleeding  - CBC with Differential/Platelet - Iron, TIBC and Ferritin Panel - Ambulatory referral to Gastroenterology  14. Hemorrhoids, unspecified hemorrhoid type  - Ambulatory referral to Gastroenterology

## 2021-01-17 LAB — CBC WITH DIFFERENTIAL/PLATELET
Absolute Monocytes: 440 cells/uL (ref 200–950)
Basophils Absolute: 31 cells/uL (ref 0–200)
Basophils Relative: 0.5 %
Eosinophils Absolute: 37 cells/uL (ref 15–500)
Eosinophils Relative: 0.6 %
HCT: 30.3 % — ABNORMAL LOW (ref 38.5–50.0)
Hemoglobin: 10.1 g/dL — ABNORMAL LOW (ref 13.2–17.1)
Lymphs Abs: 806 cells/uL — ABNORMAL LOW (ref 850–3900)
MCH: 29.8 pg (ref 27.0–33.0)
MCHC: 33.3 g/dL (ref 32.0–36.0)
MCV: 89.4 fL (ref 80.0–100.0)
MPV: 11.3 fL (ref 7.5–12.5)
Monocytes Relative: 7.1 %
Neutro Abs: 4886 cells/uL (ref 1500–7800)
Neutrophils Relative %: 78.8 %
Platelets: 237 10*3/uL (ref 140–400)
RBC: 3.39 10*6/uL — ABNORMAL LOW (ref 4.20–5.80)
RDW: 15.8 % — ABNORMAL HIGH (ref 11.0–15.0)
Total Lymphocyte: 13 %
WBC: 6.2 10*3/uL (ref 3.8–10.8)

## 2021-01-17 LAB — COMPLETE METABOLIC PANEL WITH GFR
AG Ratio: 1.3 (calc) (ref 1.0–2.5)
ALT: 11 U/L (ref 9–46)
AST: 18 U/L (ref 10–35)
Albumin: 4 g/dL (ref 3.6–5.1)
Alkaline phosphatase (APISO): 49 U/L (ref 35–144)
BUN: 13 mg/dL (ref 7–25)
CO2: 23 mmol/L (ref 20–32)
Calcium: 9.7 mg/dL (ref 8.6–10.3)
Chloride: 102 mmol/L (ref 98–110)
Creat: 0.93 mg/dL (ref 0.70–1.28)
Globulin: 3.2 g/dL (calc) (ref 1.9–3.7)
Glucose, Bld: 173 mg/dL — ABNORMAL HIGH (ref 65–99)
Potassium: 4.7 mmol/L (ref 3.5–5.3)
Sodium: 134 mmol/L — ABNORMAL LOW (ref 135–146)
Total Bilirubin: 0.7 mg/dL (ref 0.2–1.2)
Total Protein: 7.2 g/dL (ref 6.1–8.1)
eGFR: 84 mL/min/{1.73_m2} (ref >=60)

## 2021-01-17 LAB — LIPID PANEL
Cholesterol: 122 mg/dL (ref <200)
HDL: 49 mg/dL (ref >=40)
LDL Cholesterol (Calc): 58 mg/dL (calc)
Non-HDL Cholesterol (Calc): 73 mg/dL (calc) (ref <130)
Total CHOL/HDL Ratio: 2.5 (calc) (ref <5.0)
Triglycerides: 73 mg/dL (ref <150)

## 2021-01-17 LAB — MICROALBUMIN / CREATININE URINE RATIO
Creatinine, Urine: 150 mg/dL (ref 20–320)
Microalb Creat Ratio: 31 mcg/mg creat — ABNORMAL HIGH (ref <30)
Microalb, Ur: 4.6 mg/dL

## 2021-01-17 LAB — IRON,TIBC AND FERRITIN PANEL
%SAT: 12 % (calc) — ABNORMAL LOW (ref 20–48)
Ferritin: 16 ng/mL — ABNORMAL LOW (ref 24–380)
Iron: 42 ug/dL — ABNORMAL LOW (ref 50–180)
TIBC: 346 mcg/dL (calc) (ref 250–425)

## 2021-02-05 ENCOUNTER — Ambulatory Visit (INDEPENDENT_AMBULATORY_CARE_PROVIDER_SITE_OTHER): Payer: Medicare HMO

## 2021-02-05 DIAGNOSIS — Z0000 Encounter for general adult medical examination without abnormal findings: Secondary | ICD-10-CM | POA: Diagnosis not present

## 2021-02-05 NOTE — Patient Instructions (Signed)
[name] , Thank you for taking time to come for your Medicare Wellness Visit. I appreciate your ongoing commitment to your health goals. Please review the following plan we discussed and let me know if I can assist you in the future.   Screening recommendations/referrals: Colonoscopy: no longer required Recommended yearly ophthalmology/optometry visit for glaucoma screening and checkup Recommended yearly dental visit for hygiene and checkup  Vaccinations: Influenza vaccine: done 01/16/21 Pneumococcal vaccine: done 11/30/18 Tdap vaccine: done 02/18/17 Shingles vaccine: Shingrix discussed. Please contact your pharmacy for coverage information.  Covid-19:  done 04/07/19, 05/16/19 7 12/16/19; please bring a copy of your vaccine record to your next appt for booster information   Advanced directives: Advance directive discussed with you today. Even though you declined this today please call our office should you change your mind and we can give you the proper paperwork for you to fill out.   Conditions/risks identified: Recommend continuing fall prevention in the home  Next appointment: Follow up in one year for your annual wellness visit.   Preventive Care 11 Years and Older, Male Preventive care refers to lifestyle choices and visits with your health care provider that can promote health and wellness. What does preventive care include? A yearly physical exam. This is also called an annual well check. Dental exams once or twice a year. Routine eye exams. Ask your health care provider how often you should have your eyes checked. Personal lifestyle choices, including: Daily care of your teeth and gums. Regular physical activity. Eating a healthy diet. Avoiding tobacco and drug use. Limiting alcohol use. Practicing safe sex. Taking low doses of aspirin every day. Taking vitamin and mineral supplements as recommended by your health care provider. What happens during an annual well  check? The services and screenings done by your health care provider during your annual well check will depend on your age, overall health, lifestyle risk factors, and family history of disease. Counseling  Your health care provider may ask you questions about your: Alcohol use. Tobacco use. Drug use. Emotional well-being. Home and relationship well-being. Sexual activity. Eating habits. History of falls. Memory and ability to understand (cognition). Work and work Statistician. Screening  You may have the following tests or measurements: Height, weight, and BMI. Blood pressure. Lipid and cholesterol levels. These may be checked every 5 years, or more frequently if you are over 8 years old. Skin check. Lung cancer screening. You may have this screening every year starting at age 69 if you have a 30-pack-year history of smoking and currently smoke or have quit within the past 15 years. Fecal occult blood test (FOBT) of the stool. You may have this test every year starting at age 58. Flexible sigmoidoscopy or colonoscopy. You may have a sigmoidoscopy every 5 years or a colonoscopy every 10 years starting at age 50. Prostate cancer screening. Recommendations will vary depending on your family history and other risks. Hepatitis C blood test. Hepatitis B blood test. Sexually transmitted disease (STD) testing. Diabetes screening. This is done by checking your blood sugar (glucose) after you have not eaten for a while (fasting). You may have this done every 1-3 years. Abdominal aortic aneurysm (AAA) screening. You may need this if you are a current or former smoker. Osteoporosis. You may be screened starting at age 52 if you are at high risk. Talk with your health care provider about your test results, treatment options, and if necessary, the need for more tests. Vaccines  Your health care provider may  recommend certain vaccines, such as: Influenza vaccine. This is recommended every  year. Tetanus, diphtheria, and acellular pertussis (Tdap, Td) vaccine. You may need a Td booster every 10 years. Zoster vaccine. You may need this after age 53. Pneumococcal 13-valent conjugate (PCV13) vaccine. One dose is recommended after age 50. Pneumococcal polysaccharide (PPSV23) vaccine. One dose is recommended after age 79. Talk to your health care provider about which screenings and vaccines you need and how often you need them. This information is not intended to replace advice given to you by your health care provider. Make sure you discuss any questions you have with your health care provider. Document Released: 03/30/2015 Document Revised: 11/21/2015 Document Reviewed: 01/02/2015 Elsevier Interactive Patient Education  2017 [employer] Prevention in the Home Falls can cause injuries. They can happen to people of all ages. There are many things you can do to make your home safe and to help prevent falls. What can I do on the outside of my home? Regularly fix the edges of walkways and driveways and fix any cracks. Remove anything that might make you trip as you walk through a door, such as a raised step or threshold. Trim any bushes or trees on the path to your home. Use bright outdoor lighting. Clear any walking paths of anything that might make someone trip, such as rocks or tools. Regularly check to see if handrails are loose or broken. Make sure that both sides of any steps have handrails. Any raised decks and porches should have guardrails on the edges. Have any leaves, snow, or ice cleared regularly. Use sand or salt on walking paths during winter. Clean up any spills in your garage right away. This includes oil or grease spills. What can I do in the bathroom? Use night lights. Install grab bars by the toilet and in the tub and shower. Do not use towel bars as grab bars. Use non-skid mats or decals in the tub or shower. If you need to sit down in the shower, use a  plastic, non-slip stool. Keep the floor dry. Clean up any water that spills on the floor as soon as it happens. Remove soap buildup in the tub or shower regularly. Attach bath mats securely with double-sided non-slip rug tape. Do not have throw rugs and other things on the floor that can make you trip. What can I do in the bedroom? Use night lights. Make sure that you have a light by your bed that is easy to reach. Do not use any sheets or blankets that are too big for your bed. They should not hang down onto the floor. Have a firm chair that has side arms. You can use this for support while you get dressed. Do not have throw rugs and other things on the floor that can make you trip. What can I do in the kitchen? Clean up any spills right away. Avoid walking on wet floors. Keep items that you use a lot in easy-to-reach places. If you need to reach something above you, use a strong step stool that has a grab bar. Keep electrical cords out of the way. Do not use floor polish or wax that makes floors slippery. If you must use wax, use non-skid floor wax. Do not have throw rugs and other things on the floor that can make you trip. What can I do with my stairs? Do not leave any items on the stairs. Make sure that there are handrails on both sides of  the stairs and use them. Fix handrails that are broken or loose. Make sure that handrails are as long as the stairways. Check any carpeting to make sure that it is firmly attached to the stairs. Fix any carpet that is loose or worn. Avoid having throw rugs at the top or bottom of the stairs. If you do have throw rugs, attach them to the floor with carpet tape. Make sure that you have a light switch at the top of the stairs and the bottom of the stairs. If you do not have them, ask someone to add them for you. What else can I do to help prevent falls? Wear shoes that: Do not have high heels. Have rubber bottoms. Are comfortable and fit you  well. Are closed at the toe. Do not wear sandals. If you use a stepladder: Make sure that it is fully opened. Do not climb a closed stepladder. Make sure that both sides of the stepladder are locked into place. Ask someone to hold it for you, if possible. Clearly mark and make sure that you can see: Any grab bars or handrails. First and last steps. Where the edge of each step is. Use tools that help you move around (mobility aids) if they are needed. These include: Canes. Walkers. Scooters. Crutches. Turn on the lights when you go into a dark area. Replace any light bulbs as soon as they burn out. Set up your furniture so you have a clear path. Avoid moving your furniture around. If any of your floors are uneven, fix them. If there are any pets around you, be aware of where they are. Review your medicines with your doctor. Some medicines can make you feel dizzy. This can increase your chance of falling. Ask your doctor what other things that you can do to help prevent falls. This information is not intended to replace advice given to you by your health care provider. Make sure you discuss any questions you have with your health care provider. Document Released: 12/28/2008 Document Revised: 08/09/2015 Document Reviewed: 04/07/2014 Elsevier Interactive Patient Education  2017 [employer].

## 2021-02-05 NOTE — Progress Notes (Signed)
Subjective:   JOHNOTHAN BASCOMB is a 80 y.o. male who presents for Medicare Annual/Subsequent preventive examination.  Virtual Visit via Telephone Note  I connected with  [name] on 02/05/21 at 10:00 AM EST by telephone and verified that I am speaking with the correct person using two identifiers.  Location: Patient: home Provider: [place] Persons participating in the virtual visit: [place]   I discussed the limitations, risks, security and privacy concerns of performing an evaluation and management service by telephone and the availability of in person appointments. The patient expressed understanding and agreed to proceed.  Interactive audio and video telecommunications were attempted between this nurse and patient, however failed, due to patient having technical difficulties OR patient did not have access to video capability.  We continued and completed visit with audio only.  Some vital signs may be absent or patient reported.   Clemetine Marker, LPN   Review of Systems     Cardiac Risk Factors include: advanced age (>87mn, >>14women);diabetes mellitus;dyslipidemia;male gender;hypertension;obesity (BMI >30kg/m2)     Objective:    There were no vitals filed for this visit. There is no height or weight on file to calculate BMI.  Advanced Directives 02/05/2021 12/01/2019 04/20/2019 11/30/2018 11/19/2016 08/19/2016 07/28/2016  Does Patient Have a Medical Advance Directive? No No No No Yes No Yes  Type of Advance Directive - - - - HPress photographerLiving will - HHancockLiving will  Does patient want to make changes to medical advance directive? - - - - - - -  Copy of [place] Chart? - - - - No - copy requested - No - copy requested  Would patient like information on creating a medical advance directive? No - Patient declined Yes (MAU/Ambulatory/Procedural Areas - Information given) No - Patient declined Yes  (MAU/Ambulatory/Procedural Areas - Information given) - No - Patient declined -    Current Medications (verified) Outpatient Encounter Medications as of 02/05/2021  Medication Sig   ACCU-CHEK AVIVA PLUS test strip TEST TWO TIMES DAILY AS NEEDED AS DIRECTED   Accu-Chek FastClix Lancets MISC 1 each by Subdermal route 2 (two) times daily.   allopurinol (ZYLOPRIM) 300 MG tablet TAKE 1 TABLET EVERY DAY   amLODipine (NORVASC) 10 MG tablet TAKE 1 TABLET EVERY DAY   aspirin EC 81 MG tablet Take 1 tablet (81 mg total) by mouth daily. Swallow whole.   atorvastatin (LIPITOR) 40 MG tablet TAKE 1 TABLET EVERY DAY   Blood Glucose Monitoring Suppl (ACCU-CHEK AVIVA PLUS) w/Device KIT 1 each by Does not apply route 2 (two) times daily. Use as directed   glipiZIDE (GLUCOTROL XL) 5 MG 24 hr tablet TAKE 1 TABLET (5 MG TOTAL) BY MOUTH DAILY WITH BREAKFAST.   latanoprost (XALATAN) 0.005 % ophthalmic solution Place 1 drop into the left eye at bedtime.    metFORMIN (GLUCOPHAGE-XR) 750 MG 24 hr tablet TAKE 2 TABLETS (1,500 MG TOTAL) BY MOUTH DAILY WITH BREAKFAST.   Omega-3 Fatty Acids (FISH OIL PO) Take 1 capsule by mouth 2 (two) times daily.    polyethylene glycol (MIRALAX / GLYCOLAX) packet Take 17 g by mouth at bedtime.    tadalafil (CIALIS) 10 MG tablet Take 1-2 tablets (10-20 mg total) by mouth every three (3) days as needed for erectile dysfunction.   tamsulosin (FLOMAX) 0.4 MG CAPS capsule TAKE 1 CAPSULE EVERY DAY   No facility-administered encounter medications on file as of 02/05/2021.    Allergies (verified)  Ace inhibitors   History: Past Medical History:  Diagnosis Date   Aortic stenosis    Chronic kidney disease, stage II (mild)    Decreased libido    Diabetes mellitus without complication ([place])    Glaucoma    Gout 2009   Hernia [id number]   Hyperlipidemia    Hypertension    Inguinal hernia without mention of obstruction or gangrene, unilateral or unspecified, (not specified as recurrent)     Lumbago    Obesity    Unspecified constipation    Past Surgical History:  Procedure Laterality Date   cataract surgery     COLONOSCOPY  2012   COLONOSCOPY N/A 04/22/2019   Procedure: COLONOSCOPY;  Surgeon: Mansouraty, Telford Nab., MD;  Location: [facility];  Service: Gastroenterology;  Laterality: N/A;   EYE SURGERY  2011   cataract   UMBILICAL HERNIA REPAIR  2011   Family History  Problem Relation Age of Onset   Heart disease Father    Hypertension Father    Diabetes Sister    Diabetes Brother    Prostate cancer Brother    Diabetes Sister    Kidney disease Neg Hx    Kidney cancer Neg Hx    Bladder Cancer Neg Hx    Colon cancer Neg Hx    Esophageal cancer Neg Hx    Inflammatory bowel disease Neg Hx    Liver disease Neg Hx    Pancreatic cancer Neg Hx    Rectal cancer Neg Hx    Stomach cancer Neg Hx    Social History   Socioeconomic History   Marital status: Widowed    Spouse name: Not on file   Number of children: 2   Years of education: Not on file   Highest education level: Some college, no degree  Occupational History   Occupation: retired     Comment: used to [employer]   Tobacco Use   Smoking status: Former    Packs/day: 1.00    Years: 10.00    Pack years: 10.00    Types: Cigarettes    Start date: 03/17/1965    Quit date: 11/27/1975    Years since quitting: 45.2   Smokeless tobacco: Never   Tobacco comments:    quit 40 years  Vaping Use   Vaping Use: Never used  Substance and Sexual Activity   Alcohol use: Yes    Alcohol/week: 1.0 standard drink    Types: 1 Standard drinks or equivalent per week    Comment: socially - 1 x year   Drug use: No   Sexual activity: Yes    Partners: Female  Other Topics Concern   Not on file  Social History Narrative   Lives by himself in [place]   Two grown children, daughter in [place] and son in [place]   He goes to [place] to visit his sisters often    Social Determinants of Adult nurse Strain: Low Risk    Difficulty of Paying Living Expenses: Not hard at all  Food Insecurity: No Food Insecurity   Worried About Charity fundraiser in the Last Year: Never true   Arboriculturist in the Last Year: Never true  Transportation Needs: No Transportation Needs   Lack of Transportation (Medical): No   Lack of Transportation (Non-Medical): No  Physical Activity: Sufficiently Active   Days of Exercise per Week: 5 days   Minutes of Exercise per Session: 30 min  Stress: No Stress Concern Present  Feeling of Stress : Not at all  Social Connections: Moderately Integrated   Frequency of Communication with Friends and Family: More than three times a week   Frequency of Social Gatherings with Friends and Family: More than three times a week   Attends Religious Services: More than 4 times per year   Active Member of [employer] or Organizations: Yes   Attends Archivist Meetings: More than 4 times per year   Marital Status: Widowed    Tobacco Counseling Counseling given: Not Answered Tobacco comments: quit 40 years   Clinical Intake:  Pre-visit preparation completed: Yes  Pain : No/denies pain     Nutritional Risks: None Diabetes: Yes CBG done?: No Did pt. bring in CBG monitor from home?: No  How often do you need to have someone help you when you read instructions, pamphlets, or other written materials from your doctor or pharmacy?: 1 - Never  Nutrition Risk Assessment:  Has the patient had any N/V/D within the last 2 months?  No  Does the patient have any non-healing wounds?  No  Has the patient had any unintentional weight loss or weight gain?  No   Diabetes:  Is the patient diabetic?  Yes  If diabetic, was a CBG obtained today?  No  Did the patient bring in their glucometer from home?  No  How often do you monitor your CBG's? daily.   Financial Strains and Diabetes Management:  Are you having any financial strains with the device,  your supplies or your medication? No .  Does the patient want to be seen by Chronic Care Management for management of their diabetes?  No  Would the patient like to be referred to a Nutritionist or for Diabetic Management?  No   Diabetic Exams:  Diabetic Eye Exam: Completed 02/22/20 negative retinopathy.   Diabetic Foot Exam: Completed 01/16/21.  Interpreter Needed?: No  Information entered by :: Clemetine Marker LPN   Activities of Daily Living In your present state of health, do you have any difficulty performing the following activities: 02/05/2021 01/16/2021  Hearing? Tempie Donning  Vision? N N  Difficulty concentrating or making decisions? N N  Walking or climbing stairs? Y Y  Dressing or bathing? N N  Doing errands, shopping? N N  Preparing Food and eating ? N -  Using the Toilet? N -  In the past six months, have you accidently leaked urine? N -  Do you have problems with loss of bowel control? N -  Managing your Medications? N -  Managing your Finances? N -  Housekeeping or managing your Housekeeping? N -  Some recent data might be hidden    Patient Care Team: Steele Sizer, MD as PCP - General (Family Medicine) Nori Riis PA-C as Physician Assistant (Urology) Minna Merritts, MD as Consulting Physician (Cardiology) Lucky Cowboy Erskine Squibb, MD as Referring Physician (Vascular Surgery) Gardiner Barefoot, DPM as Consulting Physician (Podiatry)  Indicate any recent Medical Services you may have received from other than [facility] providers in the past year (date may be approximate).     Assessment:   This is a routine wellness examination for Jordan [name].  Hearing/Vision screen Hearing Screening - Comments:: Pt states mild hearing difficulty, declines hearing aids Vision Screening - Comments:: Annual vision screen[facility]  Dietary issues and exercise activities discussed: Current Exercise Habits: Home exercise routine, Type of exercise: walking, Time  (Minutes): 30, Frequency (Times/Week): 5, Weekly Exercise (Minutes/Week): 150, Intensity:  Mild, Exercise limited by: orthopedic condition(s)   Goals Addressed             This Visit's Progress    Patient Stated   On track    Lower A1c to less than 7.0       Depression Screen PHQ 2/9 Scores 02/05/2021 01/16/2021 08/15/2020 04/24/2020 12/23/2019 12/01/2019 10/21/2019  PHQ - 2 Score 0 0 3 0 3 0 0  PHQ- 9 Score '3 3 4 2 10 ' - -    Fall Risk Fall Risk  02/05/2021 01/16/2021 08/15/2020 04/24/2020 12/23/2019  Falls in the past year? 0 0 0 0 0  Number falls in past yr: 0 0 0 0 0  Injury with Fall? 0 0 0 0 0  Risk for fall due to : No Fall Risks No Fall Risks - - -  Follow up Falls prevention discussed Falls prevention discussed - - -    FALL RISK PREVENTION PERTAINING TO THE HOME:  Any stairs in or around the home? Yes  If so, are there any without handrails? No  Home free of loose throw rugs in walkways, pet beds, electrical cords, etc? Yes  Adequate lighting in your home to reduce risk of falls? Yes   ASSISTIVE DEVICES UTILIZED TO PREVENT FALLS:  Life alert? No  Use of a cane, walker or w/c? Yes  Grab bars in the bathroom? Yes  Shower chair or bench in shower? No  Elevated toilet seat or a handicapped toilet? Yes   TIMED UP AND GO:  Was the test performed? No . Telephonic visit.   Cognitive Function: Normal cognitive status assessed by direct observation by this Nurse Health Advisor. No abnormalities found.       6CIT Screen 11/30/2018  What Year? 0 points  What month? 0 points  What time? 0 points  Count back from 20 0 points  Months in reverse 0 points  Repeat phrase 6 points  Total Score 6    Immunizations Immunization History  Administered Date(s) Administered   Fluad Quad(high Dose 65+) 12/23/2019, 01/16/2021   Influenza, High Dose Seasonal PF 11/27/2015, 11/19/2016, 11/25/2017   Influenza,inj,Quad PF,6+ Mos 11/09/2014, 11/30/2018   PFIZER(Purple Top)SARS-COV-2  Vaccination 04/07/2019, 05/16/2019, 12/16/2019   Pneumococcal Conjugate-13 11/19/2016   Pneumococcal Polysaccharide-23 11/30/2018   Tdap 02/18/2017    TDAP status: Up to date  Flu Vaccine status: Up to date  Pneumococcal vaccine status: Up to date  Covid-19 vaccine status: Completed vaccines  Qualifies for Shingles Vaccine? Yes   Zostavax completed No   Shingrix Completed?: No.    Education has been provided regarding the importance of this vaccine. Patient has been advised to call insurance company to determine out of pocket expense if they have not yet received this vaccine. Advised may also receive vaccine at local pharmacy or Health Dept. Verbalized acceptance and understanding.  Screening Tests Health Maintenance  Topic Date Due   Zoster Vaccines- Shingrix (1 of 2) Never done   COVID-19 Vaccine (4 - Booster for Pfizer series) 02/10/2020   OPHTHALMOLOGY EXAM  02/21/2021   HEMOGLOBIN A1C  07/16/2021   FOOT EXAM  01/16/2022   URINE MICROALBUMIN  01/16/2022   TETANUS/TDAP  02/19/2027   Pneumonia Vaccine 30+ Years old  Completed   INFLUENZA VACCINE  Completed   Hepatitis C Screening  Completed   HPV VACCINES  Aged Out    Health Maintenance  Health Maintenance Due  Topic Date Due   Zoster Vaccines- Shingrix (1 of 2) Never done  COVID-19 Vaccine (4 - Booster for Pfizer series) 02/10/2020    Colorectal cancer screening: No longer required.   Lung Cancer Screening: (Low Dose CT Chest recommended if Age 52-80 years, 30 pack-year currently smoking OR have quit w/in 15years.) does not qualify.   Additional Screening:  Hepatitis C Screening: does qualify; Completed 04/20/19  Vision Screening: Recommended annual ophthalmology exams for early detection of glaucoma and other disorders of the eye. Is the patient up to date with their annual eye exam?  Yes  Who is the provider or what is the name of the office in which the patient attends annual eye exams? [facility].    Dental Screening: Recommended annual dental exams for proper oral hygiene  Community Resource Referral / Chronic Care Management: CRR required this visit?  No   CCM required this visit?  No      Plan:     I have personally reviewed and noted the following in the patient's chart:   Medical and social history Use of alcohol, tobacco or illicit drugs  Current medications and supplements including opioid prescriptions. Patient is not currently taking opioid prescriptions. Functional ability and status Nutritional status Physical activity Advanced directives List of other physicians Hospitalizations, surgeries, and ER visits in previous 12 months Vitals Screenings to include cognitive, depression, and falls Referrals and appointments  In addition, I have reviewed and discussed with patient certain preventive protocols, quality metrics, and best practice recommendations. A written personalized care plan for preventive services as well as general preventive health recommendations were provided to patient.     Clemetine Marker, LPN   06/00/4599   Nurse Notes: none

## 2021-02-11 NOTE — Progress Notes (Signed)
Cardiology Office Note  Date:  02/12/2021   ID:  Jordan [name], DOB [birth year], MRN [id number]  PCP:  Steele Sizer, MD   Chief Complaint  Patient presents with   Follow-up    Patient c/o fatigue, shortness of breath & LE edema. Medications reviewed by the patient verbally.     HPI:  Jordan [name] is a 80 year old gentleman with past medical history of Diabetes Smoked, age 79, 60 years Morbid obesity ARB/ACE causing angioedema Essential hypertension Hyperlipidemia Severe aortic valve stenosis,  Who presents for f/u of his aortic valve stenosis  Echo 03/2020, prior study reviewed with him The aortic valve has an indeterminant number of cusps. There is  moderate calcification of the aortic valve. There is severe thickening of  the aortic valve. Aortic valve regurgitation is not visualized. Severe  aortic valve stenosis. Aortic valve mean  gradient measures 47.0 mmHg.   We recommended TEE, he was reluctant at that time  Presents today with worsening shortness of breath, chest tightness Not very active at baseline, presents in a wheelchair, reports that he uses a cane at home  Interested in pursuing work-up for his aortic valve stenosis  Echo June 2020 Severe calcifcation of the  aortic valve. Moderate stenosis of the aortic valve. Mean gradient 26 mm  Hg, peak gradient 45 mm Hg, Peak velocity 3.34 m/sec   Lab work reviewed with him in detail Total chol 124 LDL 60 HBA1C 7.7  EKG personally reviewed by myself on todays visit Shows normal sinus rhythm right bundle branch block rate 92 bpm  PMH:   has a past medical history of Aortic stenosis, Chronic kidney disease, stage II (mild), Decreased libido, Diabetes mellitus without complication ([place]), Glaucoma, Gout (2009), Hernia (5366,4403), Hyperlipidemia, Hypertension, Inguinal hernia without mention of obstruction or gangrene, unilateral or unspecified, (not specified as recurrent), Lumbago, Obesity, and Unspecified  constipation.  PSH:    Past Surgical History:  Procedure Laterality Date   cataract surgery     COLONOSCOPY  2012   COLONOSCOPY N/A 04/22/2019   Procedure: COLONOSCOPY;  Surgeon: Mansouraty, [place] Nab., MD;  Location: [facility];  Service: Gastroenterology;  Laterality: N/A;   EYE SURGERY  2011   cataract   UMBILICAL HERNIA REPAIR  2011    Current Outpatient Medications  Medication Sig Dispense Refill   ACCU-CHEK AVIVA PLUS test strip TEST TWO TIMES DAILY AS NEEDED AS DIRECTED 200 strip 2   Accu-Chek FastClix Lancets MISC 1 each by Subdermal route 2 (two) times daily. 102 each 2   allopurinol (ZYLOPRIM) 300 MG tablet TAKE 1 TABLET EVERY DAY 90 tablet 1   amLODipine (NORVASC) 10 MG tablet TAKE 1 TABLET EVERY DAY 90 tablet 1   aspirin EC 81 MG tablet Take 1 tablet (81 mg total) by mouth daily. Swallow whole.     atorvastatin (LIPITOR) 40 MG tablet TAKE 1 TABLET EVERY DAY 90 tablet 1   Blood Glucose Monitoring Suppl (ACCU-CHEK AVIVA PLUS) w/Device KIT 1 each by Does not apply route 2 (two) times daily. Use as directed 100 kit 12   glipiZIDE (GLUCOTROL XL) 5 MG 24 hr tablet TAKE 1 TABLET (5 MG TOTAL) BY MOUTH DAILY WITH BREAKFAST. 90 tablet 1   latanoprost (XALATAN) 0.005 % ophthalmic solution Place 1 drop into the left eye at bedtime.      metFORMIN (GLUCOPHAGE-XR) 750 MG 24 hr tablet TAKE 2 TABLETS (1,500 MG TOTAL) BY MOUTH DAILY WITH BREAKFAST. 180 tablet 1   Omega-3 Fatty Acids (FISH OIL  PO) Take 1 capsule by mouth 2 (two) times daily.      polyethylene glycol (MIRALAX / GLYCOLAX) packet Take 17 g by mouth at bedtime.      tadalafil (CIALIS) 10 MG tablet Take 1-2 tablets (10-20 mg total) by mouth every three (3) days as needed for erectile dysfunction. 30 tablet 0   tamsulosin (FLOMAX) 0.4 MG CAPS capsule TAKE 1 CAPSULE EVERY DAY 90 capsule 1   No current facility-administered medications for this visit.     Allergies:   Ace inhibitors   Social History:  The patient  reports  that he quit smoking about 45 years ago. His smoking use included cigarettes. He started smoking about 55 years ago. He has a 10.00 pack-year smoking history. He has never used smokeless tobacco. He reports current alcohol use of about 1.0 standard drink per week. He reports that he does not use drugs.   Family History:   family history includes Diabetes in his brother, sister, and sister; Heart disease in his father; Hypertension in his father; Prostate cancer in his brother.    Review of Systems: Review of Systems  Constitutional: Negative.   HENT: Negative.    Respiratory: Negative.    Cardiovascular: Negative.   Gastrointestinal: Negative.   Musculoskeletal: Negative.        Leg weakness  Neurological: Negative.   Psychiatric/Behavioral: Negative.    All other systems reviewed and are negative.  PHYSICAL EXAM: VS:  BP (!) 110/58 (BP Location: Left Arm, Patient Position: Sitting, Cuff Size: Large)   Pulse 92   Ht '5\' 9"'  (1.753 m)   Wt 298 lb 8 oz (135.4 kg)   SpO2 99%   BMI 44.08 kg/m  , BMI Body mass index is 44.08 kg/m. Presenting in a wheelchair Constitutional:  oriented to person, place, and time. No distress.  HENT:  Head: Grossly normal Eyes:  no discharge. No scleral icterus.  Neck: No JVD, no carotid bruits  Cardiovascular: Regular rate and rhythm, 3/6 systolic ejection murmur appreciated right sternal border Pulmonary/Chest: Clear to auscultation bilaterally, no wheezes or rails Abdominal: Soft.  no distension.  no tenderness.  Musculoskeletal: Normal range of motion Neurological:  normal muscle tone. Coordination normal. No atrophy Skin: Skin warm and dry Psychiatric: normal affect, pleasant  Recent Labs: 01/16/2021: ALT 11; BUN 13; Creat 0.93; Hemoglobin 10.1; Platelets 237; Potassium 4.7; Sodium 134    Lipid Panel Lab Results  Component Value Date   CHOL 122 01/16/2021   HDL 49 01/16/2021   LDLCALC 58 01/16/2021   TRIG 73 01/16/2021      Wt Readings  from Last 3 Encounters:  02/12/21 298 lb 8 oz (135.4 kg)  01/16/21 296 lb (134.3 kg)  12/19/20 298 lb (135.2 kg)     ASSESSMENT AND PLAN:  Essential hypertension - Plan: EKG 12-Lead Blood pressure is well controlled on today's visit. No changes made to the medications.  Aortic valve stenosis, severe Moderate aortic valve stenosis June 2020 Severe stenosis January 2022 We have recommended right and left heart catheterization, Will hope to cross the aortic valve to estimate degree of aortic valve stenosis He is interested in pursuing work-up for TAVR If needed would be willing to undergo TEE Following catheterization we will look to refer to structural heart team in [place] I have reviewed the risks, indications, and alternatives to cardiac catheterization, possible angioplasty, and stenting with the patient. Risks include but are not limited to bleeding, infection, vascular injury, stroke, myocardial infection, arrhythmia, kidney injury,  radiation-related injury in the case of prolonged fluoroscopy use, emergency cardiac surgery, and death. The patient understands the risks of serious complication is 1-2 in 5974 with diagnostic cardiac cath and 1-2% or less with angioplasty/stenting.    Mixed hyperlipidemia Cholesterol is at goal on the current lipid regimen. No changes to the medications were made.  Controlled type 2 diabetes mellitus with microalbuminuria, without long-term current use of insulin (HCC) Hemoglobin A1c 6.9, down from 7.7 Low carbohydrate diet recommended  Obesity, Class III, BMI 40-49.9 (morbid obesity) ([place]) Recommend regular walking program for leg weakness, weight loss    Total encounter time more than 35 minutes  Greater than 50% was spent in counseling and coordination of care with the patient    Orders Placed This Encounter  Procedures   EKG 12-Lead     Signed, Esmond Plants, M.D., Ph.D. 02/12/2021  [place],  [place] [phone]

## 2021-02-11 NOTE — H&P (View-Only) (Signed)
Cardiology Office Note  Date:  02/12/2021   ID:  Jordan [name], DOB 03/01/1941, MRN [id number]  PCP:  Steele Sizer, MD   Chief Complaint  Patient presents with   Follow-up    Patient c/o fatigue, shortness of breath & LE edema. Medications reviewed by the patient verbally.     HPI:  [name] is a 80 year old gentleman with past medical history of Diabetes Smoked, age 24, 64 years Morbid obesity ARB/ACE causing angioedema Essential hypertension Hyperlipidemia Severe aortic valve stenosis,  Who presents for f/u of his aortic valve stenosis  Echo 03/2020, prior study reviewed with him The aortic valve has an indeterminant number of cusps. There is  moderate calcification of the aortic valve. There is severe thickening of  the aortic valve. Aortic valve regurgitation is not visualized. Severe  aortic valve stenosis. Aortic valve mean  gradient measures 47.0 mmHg.   We recommended TEE, he was reluctant at that time  Presents today with worsening shortness of breath, chest tightness Not very active at baseline, presents in a wheelchair, reports that he uses a cane at home  Interested in pursuing work-up for his aortic valve stenosis  Echo June 2020 Severe calcifcation of the  aortic valve. Moderate stenosis of the aortic valve. Mean gradient 26 mm  Hg, peak gradient 45 mm Hg, Peak velocity 3.34 m/sec   Lab work reviewed with him in detail Total chol 124 LDL 60 HBA1C 7.7  EKG personally reviewed by myself on todays visit Shows normal sinus rhythm right bundle branch block rate 92 bpm  PMH:   has a past medical history of Aortic stenosis, Chronic kidney disease, stage II (mild), Decreased libido, Diabetes mellitus without complication ([place]), Glaucoma, Gout (2009), Hernia (4098,1191), Hyperlipidemia, Hypertension, Inguinal hernia without mention of obstruction or gangrene, unilateral or unspecified, (not specified as recurrent), Lumbago, Obesity, and Unspecified  constipation.  PSH:    Past Surgical History:  Procedure Laterality Date   cataract surgery     COLONOSCOPY  2012   COLONOSCOPY N/A 04/22/2019   Procedure: COLONOSCOPY;  Surgeon: Mansouraty, Telford Nab., MD;  Location: [facility];  Service: Gastroenterology;  Laterality: N/A;   EYE SURGERY  2011   cataract   UMBILICAL HERNIA REPAIR  2011    Current Outpatient Medications  Medication Sig Dispense Refill   ACCU-CHEK AVIVA PLUS test strip TEST TWO TIMES DAILY AS NEEDED AS DIRECTED 200 strip 2   Accu-Chek FastClix Lancets MISC 1 each by Subdermal route 2 (two) times daily. 102 each 2   allopurinol (ZYLOPRIM) 300 MG tablet TAKE 1 TABLET EVERY DAY 90 tablet 1   amLODipine (NORVASC) 10 MG tablet TAKE 1 TABLET EVERY DAY 90 tablet 1   aspirin EC 81 MG tablet Take 1 tablet (81 mg total) by mouth daily. Swallow whole.     atorvastatin (LIPITOR) 40 MG tablet TAKE 1 TABLET EVERY DAY 90 tablet 1   Blood Glucose Monitoring Suppl (ACCU-CHEK AVIVA PLUS) w/Device KIT 1 each by Does not apply route 2 (two) times daily. Use as directed 100 kit 12   glipiZIDE (GLUCOTROL XL) 5 MG 24 hr tablet TAKE 1 TABLET (5 MG TOTAL) BY MOUTH DAILY WITH BREAKFAST. 90 tablet 1   latanoprost (XALATAN) 0.005 % ophthalmic solution Place 1 drop into the left eye at bedtime.      metFORMIN (GLUCOPHAGE-XR) 750 MG 24 hr tablet TAKE 2 TABLETS (1,500 MG TOTAL) BY MOUTH DAILY WITH BREAKFAST. 180 tablet 1   Omega-3 Fatty Acids (FISH OIL  PO) Take 1 capsule by mouth 2 (two) times daily.      polyethylene glycol (MIRALAX / GLYCOLAX) packet Take 17 g by mouth at bedtime.      tadalafil (CIALIS) 10 MG tablet Take 1-2 tablets (10-20 mg total) by mouth every three (3) days as needed for erectile dysfunction. 30 tablet 0   tamsulosin (FLOMAX) 0.4 MG CAPS capsule TAKE 1 CAPSULE EVERY DAY 90 capsule 1   No current facility-administered medications for this visit.     Allergies:   Ace inhibitors   Social History:  The patient  reports  that he quit smoking about 45 years ago. His smoking use included cigarettes. He started smoking about 55 years ago. He has a 10.00 pack-year smoking history. He has never used smokeless tobacco. He reports current alcohol use of about 1.0 standard drink per week. He reports that he does not use drugs.   Family History:   family history includes Diabetes in his brother, sister, and sister; Heart disease in his father; Hypertension in his father; Prostate cancer in his brother.    Review of Systems: Review of Systems  Constitutional: Negative.   HENT: Negative.    Respiratory: Negative.    Cardiovascular: Negative.   Gastrointestinal: Negative.   Musculoskeletal: Negative.        Leg weakness  Neurological: Negative.   Psychiatric/Behavioral: Negative.    All other systems reviewed and are negative.  PHYSICAL EXAM: VS:  BP (!) 110/58 (BP Location: Left Arm, Patient Position: Sitting, Cuff Size: Large)   Pulse 92   Ht '5\' 9"'  (1.753 m)   Wt 298 lb 8 oz (135.4 kg)   SpO2 99%   BMI 44.08 kg/m  , BMI Body mass index is 44.08 kg/m. Presenting in a wheelchair Constitutional:  oriented to person, place, and time. No distress.  HENT:  Head: Grossly normal Eyes:  no discharge. No scleral icterus.  Neck: No JVD, no carotid bruits  Cardiovascular: Regular rate and rhythm, 3/6 systolic ejection murmur appreciated right sternal border Pulmonary/Chest: Clear to auscultation bilaterally, no wheezes or rails Abdominal: Soft.  no distension.  no tenderness.  Musculoskeletal: Normal range of motion Neurological:  normal muscle tone. Coordination normal. No atrophy Skin: Skin warm and dry Psychiatric: normal affect, pleasant  Recent Labs: 01/16/2021: ALT 11; BUN 13; Creat 0.93; Hemoglobin 10.1; Platelets 237; Potassium 4.7; Sodium 134    Lipid Panel Lab Results  Component Value Date   CHOL 122 01/16/2021   HDL 49 01/16/2021   LDLCALC 58 01/16/2021   TRIG 73 01/16/2021      Wt Readings  from Last 3 Encounters:  02/12/21 298 lb 8 oz (135.4 kg)  01/16/21 296 lb (134.3 kg)  12/19/20 298 lb (135.2 kg)     ASSESSMENT AND PLAN:  Essential hypertension - Plan: EKG 12-Lead Blood pressure is well controlled on today's visit. No changes made to the medications.  Aortic valve stenosis, severe Moderate aortic valve stenosis June 2020 Severe stenosis January 2022 We have recommended right and left heart catheterization, Will hope to cross the aortic valve to estimate degree of aortic valve stenosis He is interested in pursuing work-up for TAVR If needed would be willing to undergo TEE Following catheterization we will look to refer to structural heart team in [place] I have reviewed the risks, indications, and alternatives to cardiac catheterization, possible angioplasty, and stenting with the patient. Risks include but are not limited to bleeding, infection, vascular injury, stroke, myocardial infection, arrhythmia, kidney injury,  radiation-related injury in the case of prolonged fluoroscopy use, emergency cardiac surgery, and death. The patient understands the risks of serious complication is 1-2 in 8867 with diagnostic cardiac cath and 1-2% or less with angioplasty/stenting.    Mixed hyperlipidemia Cholesterol is at goal on the current lipid regimen. No changes to the medications were made.  Controlled type 2 diabetes mellitus with microalbuminuria, without long-term current use of insulin (HCC) Hemoglobin A1c 6.9, down from 7.7 Low carbohydrate diet recommended  Obesity, Class III, BMI 40-49.9 (morbid obesity) ([place]) Recommend regular walking program for leg weakness, weight loss    Total encounter time more than 35 minutes  Greater than 50% was spent in counseling and coordination of care with the patient    Orders Placed This Encounter  Procedures   EKG 12-Lead     Signed, Esmond Plants, M.D., Ph.D. 02/12/2021  [place],  [place] [phone]

## 2021-02-12 ENCOUNTER — Ambulatory Visit: Payer: Medicare HMO | Admitting: Cardiovascular Disease

## 2021-02-12 ENCOUNTER — Encounter: Payer: Self-pay | Admitting: Cardiovascular Disease

## 2021-02-12 ENCOUNTER — Other Ambulatory Visit: Payer: Self-pay

## 2021-02-12 VITALS — BP 110/58 | HR 92 | Ht 69.0 in | Wt 298.5 lb

## 2021-02-12 DIAGNOSIS — E1129 Type 2 diabetes mellitus with other diabetic kidney complication: Secondary | ICD-10-CM | POA: Diagnosis not present

## 2021-02-12 DIAGNOSIS — N1831 Chronic kidney disease, stage 3a: Secondary | ICD-10-CM

## 2021-02-12 DIAGNOSIS — I350 Nonrheumatic aortic (valve) stenosis: Secondary | ICD-10-CM | POA: Diagnosis not present

## 2021-02-12 DIAGNOSIS — Z01810 Encounter for preprocedural cardiovascular examination: Secondary | ICD-10-CM

## 2021-02-12 DIAGNOSIS — R809 Proteinuria, unspecified: Secondary | ICD-10-CM

## 2021-02-12 DIAGNOSIS — I10 Essential (primary) hypertension: Secondary | ICD-10-CM | POA: Diagnosis not present

## 2021-02-12 DIAGNOSIS — E782 Mixed hyperlipidemia: Secondary | ICD-10-CM | POA: Diagnosis not present

## 2021-02-12 DIAGNOSIS — Z8679 Personal history of other diseases of the circulatory system: Secondary | ICD-10-CM | POA: Insufficient documentation

## 2021-02-12 DIAGNOSIS — I700 Atherosclerosis of aorta: Secondary | ICD-10-CM | POA: Diagnosis not present

## 2021-02-12 DIAGNOSIS — I77819 Aortic ectasia, unspecified site: Secondary | ICD-10-CM

## 2021-02-12 NOTE — Patient Instructions (Signed)
Medication Instructions:  No changes  If you need a refill on your cardiac medications before your next appointment, please call your pharmacy.   Lab work: CBC & BMP  Testing/Procedures: Heart Cath [facility]) w/Dr. End 02/15/2021 (Friday) 12 pm  Follow-Up: At [facility], you and your health needs are our priority.  As part of our continuing mission to provide you with exceptional heart care, we have created designated Provider Care Teams.  These Care Teams include your primary Cardiologist (physician) and Advanced Practice Providers (APPs -  Physician Assistants and Nurse Practitioners) who all work together to provide you with the care you need, when you need it.  You will need a follow up appointment in 6 months  Providers on your designated Care Team:   Murray Hodgkins, NP Christell Faith, PA-C Cadence Kathlen Mody, [place]  COVID-19 Vaccine Information can be found at: ShippingScam.co.uk For questions related to vaccine distribution or appointments, please email vaccine@[email or web address] .[email or web address] or call [phone])[phone].   [facility] Cardiac Cath  Instructions  You are scheduled for a Cardiac Cath on: 02/15/2021 Please arrive at 12:00 pm on the day of your  procedure Your start time is 1:00 PM Please expect a call from our [place] to pre-register you Do not eat/drink anything after midnight Someone will need to drive you home It is recommended someone be with you for the first  24 hours after your procedure Wear clothes that are easy to get on/off and wear slip on shoes if possible  Please bring a current list of all medications with you  _X__ You may take all of your medications the morning of your procedure with enough water to swallow safely Take an 81 mg Aspirin   _X__ DO NOT take these medications before your procedure: Hold metformin 24 hrs prior to procedure and 48 hours after the procedure. May take metformin after  the 48 hours.   For your safety and being able to monitor your condition, we request that they you do not wear nail polish, gel polish, artificial nails, or any other type of covering on natural nails including finger and toenails. If you have artificial nails, gel coating, etc.that requires to be removed by a nail saloon please have this removed prior to procedure. Your procedure may need to be canceled/ delayed if the performing provider/anesthesia team feels like the patient is unable to be adequately monitored.  Day of your procedure:  Arrive at the [facility] entrance.  Free valet service is available.  After entering the [place] please check-in at the registration desk (1st desk on your right) to receive your armband. After receiving your armband someone will escort you to the cardiac cath/special procedures waiting area.  The usual length of stay after your procedure is about 2 to 3 hours.  This can vary.  If you have any questions, please call our office at [phone], or you may call the cardiac cath lab at [facility] directly at [phone]  Your physician has requested that you have a cardiac catheterization. Cardiac catheterization is used to diagnose and/or treat various heart conditions. Doctors may recommend this procedure for a number of different reasons. The most common reason is to evaluate chest pain. Chest pain can be a symptom of coronary artery disease (CAD), and cardiac catheterization can show whether plaque is narrowing or blocking your heart's arteries. This procedure is also used to evaluate the valves, as well as measure the blood flow and oxygen levels in different parts of your heart.  For further information please visit HugeFiesta.tn. Please follow instruction sheet, as given.

## 2021-02-13 ENCOUNTER — Other Ambulatory Visit: Payer: Self-pay | Admitting: Cardiovascular Disease

## 2021-02-13 DIAGNOSIS — I209 Angina pectoris, unspecified: Secondary | ICD-10-CM

## 2021-02-13 LAB — BASIC METABOLIC PANEL
BUN/Creatinine Ratio: 15 (ref 10–24)
BUN: 15 mg/dL (ref 8–27)
CO2: 18 mmol/L — ABNORMAL LOW (ref 20–29)
Calcium: 9.5 mg/dL (ref 8.6–10.2)
Chloride: 103 mmol/L (ref 96–106)
Creatinine, Ser: 0.99 mg/dL (ref 0.76–1.27)
Glucose: 289 mg/dL — ABNORMAL HIGH (ref 70–99)
Potassium: 4.5 mmol/L (ref 3.5–5.2)
Sodium: 134 mmol/L (ref 134–144)
eGFR: 77 mL/min/{1.73_m2} (ref >59)

## 2021-02-13 LAB — CBC
Hematocrit: 33.9 % — ABNORMAL LOW (ref 37.5–51.0)
Hemoglobin: 10.7 g/dL — ABNORMAL LOW (ref 13.0–17.7)
MCH: 27.5 pg (ref 26.6–33.0)
MCHC: 31.6 g/dL (ref 31.5–35.7)
MCV: 87 fL (ref 79–97)
Platelets: 247 10*3/uL (ref 150–450)
RBC: 3.89 x10E6/uL — ABNORMAL LOW (ref 4.14–5.80)
RDW: 15.2 % (ref 11.6–15.4)
WBC: 5.4 10*3/uL (ref 3.4–10.8)

## 2021-02-15 ENCOUNTER — Other Ambulatory Visit: Payer: Self-pay

## 2021-02-15 ENCOUNTER — Encounter: Payer: Self-pay | Admitting: Internal Medicine

## 2021-02-15 ENCOUNTER — Encounter
Admission: RE | Disposition: A | Discharge status: Home / Self Care | Payer: Self-pay | Source: Home / Self Care | Attending: Internal Medicine

## 2021-02-15 ENCOUNTER — Ambulatory Visit
Admission: RE | Admit: 2021-02-15 | Discharge: 2021-02-15 | Disposition: A | Discharge status: Home / Self Care | Payer: Medicare HMO | Attending: Internal Medicine | Admitting: Internal Medicine

## 2021-02-15 DIAGNOSIS — I2510 Atherosclerotic heart disease of native coronary artery without angina pectoris: Secondary | ICD-10-CM

## 2021-02-15 DIAGNOSIS — I129 Hypertensive chronic kidney disease with stage 1 through stage 4 chronic kidney disease, or unspecified chronic kidney disease: Secondary | ICD-10-CM | POA: Diagnosis not present

## 2021-02-15 DIAGNOSIS — E1122 Type 2 diabetes mellitus with diabetic chronic kidney disease: Secondary | ICD-10-CM | POA: Diagnosis not present

## 2021-02-15 DIAGNOSIS — E782 Mixed hyperlipidemia: Secondary | ICD-10-CM | POA: Insufficient documentation

## 2021-02-15 DIAGNOSIS — Z8679 Personal history of other diseases of the circulatory system: Secondary | ICD-10-CM | POA: Insufficient documentation

## 2021-02-15 DIAGNOSIS — I350 Nonrheumatic aortic (valve) stenosis: Secondary | ICD-10-CM | POA: Diagnosis not present

## 2021-02-15 DIAGNOSIS — N521 Erectile dysfunction due to diseases classified elsewhere: Secondary | ICD-10-CM

## 2021-02-15 DIAGNOSIS — Z87891 Personal history of nicotine dependence: Secondary | ICD-10-CM | POA: Diagnosis not present

## 2021-02-15 DIAGNOSIS — N182 Chronic kidney disease, stage 2 (mild): Secondary | ICD-10-CM | POA: Diagnosis not present

## 2021-02-15 DIAGNOSIS — E1140 Type 2 diabetes mellitus with diabetic neuropathy, unspecified: Secondary | ICD-10-CM

## 2021-02-15 DIAGNOSIS — I209 Angina pectoris, unspecified: Secondary | ICD-10-CM

## 2021-02-15 DIAGNOSIS — Z6841 Body Mass Index (BMI) 40.0 and over, adult: Secondary | ICD-10-CM | POA: Diagnosis not present

## 2021-02-15 HISTORY — PX: RIGHT HEART CATH AND CORONARY ANGIOGRAPHY: CATH118264

## 2021-02-15 LAB — GLUCOSE, CAPILLARY
Glucose-Capillary: 186 mg/dL — ABNORMAL HIGH (ref 70–99)
Glucose-Capillary: 126 mg/dL — ABNORMAL HIGH (ref 70–99)

## 2021-02-15 SURGERY — RIGHT HEART CATH AND CORONARY ANGIOGRAPHY
Anesthesia: Moderate Sedation | Laterality: Bilateral

## 2021-02-15 SURGERY — RIGHT AND LEFT HEART CATH
Anesthesia: Moderate Sedation

## 2021-02-15 MED ORDER — ASPIRIN 81 MG PO CHEW: 81.0000 mg | CHEWABLE_TABLET | ORAL | Status: DC

## 2021-02-15 MED ORDER — ACETAMINOPHEN 325 MG PO TABS: 650.0000 mg | ORAL_TABLET | ORAL | Status: DC | PRN

## 2021-02-15 MED ORDER — MIDAZOLAM HCL 2 MG/2ML IJ SOLN
INTRAMUSCULAR | Status: DC | PRN
  Administered 2021-02-15: .5 mg via INTRAVENOUS

## 2021-02-15 MED ORDER — VERAPAMIL HCL 2.5 MG/ML IV SOLN
INTRAVENOUS | Status: AC
  Filled 2021-02-15: qty 2

## 2021-02-15 MED ORDER — HEPARIN SODIUM (PORCINE) 1000 UNIT/ML IJ SOLN
INTRAMUSCULAR | Status: AC
  Filled 2021-02-15: qty 10

## 2021-02-15 MED ORDER — SODIUM CHLORIDE 0.9% FLUSH: 3.0000 mL | Freq: Two times a day (BID) | INTRAVENOUS | Status: DC

## 2021-02-15 MED ORDER — FUROSEMIDE 20 MG PO TABS: 20.0000 mg | ORAL_TABLET | Freq: Every day | ORAL | 5 refills | Status: DC

## 2021-02-15 MED ORDER — SODIUM CHLORIDE 0.9 % WEIGHT BASED INFUSION
3.0000 mL/kg/h | INTRAVENOUS | Status: AC
  Administered 2021-02-15: 3 mL/kg/h via INTRAVENOUS

## 2021-02-15 MED ORDER — IOHEXOL 350 MG/ML SOLN
INTRAVENOUS | Status: DC | PRN
  Administered 2021-02-15: 30 mL

## 2021-02-15 MED ORDER — ONDANSETRON HCL 4 MG/2ML IJ SOLN: 4.0000 mg | Freq: Four times a day (QID) | INTRAMUSCULAR | Status: DC | PRN

## 2021-02-15 MED ORDER — HYDRALAZINE HCL 20 MG/ML IJ SOLN: 10.0000 mg | INTRAMUSCULAR | Status: DC | PRN

## 2021-02-15 MED ORDER — HEPARIN (PORCINE) IN NACL 1000-0.9 UT/500ML-% IV SOLN
INTRAVENOUS | Status: AC
  Filled 2021-02-15: qty 1000

## 2021-02-15 MED ORDER — SODIUM CHLORIDE 0.9 % IV SOLN: 250.0000 mL | INTRAVENOUS | Status: DC | PRN

## 2021-02-15 MED ORDER — METFORMIN HCL ER 750 MG PO TB24: 1500.0000 mg | ORAL_TABLET | Freq: Every day | ORAL | 1 refills | Status: DC

## 2021-02-15 MED ORDER — SODIUM CHLORIDE 0.9% FLUSH: 3.0000 mL | INTRAVENOUS | Status: DC | PRN

## 2021-02-15 MED ORDER — SODIUM CHLORIDE 0.9 % WEIGHT BASED INFUSION
1.0000 mL/kg/h | INTRAVENOUS | Status: DC
  Administered 2021-02-15: 20 mL/h via INTRAVENOUS

## 2021-02-15 MED ORDER — HEPARIN SODIUM (PORCINE) 1000 UNIT/ML IJ SOLN
INTRAMUSCULAR | Status: DC | PRN
  Administered 2021-02-15: 5000 [IU] via INTRAVENOUS

## 2021-02-15 MED ORDER — FENTANYL CITRATE (PF) 100 MCG/2ML IJ SOLN
INTRAMUSCULAR | Status: AC
  Filled 2021-02-15: qty 2

## 2021-02-15 MED ORDER — LIDOCAINE HCL (PF) 1 % IJ SOLN
INTRAMUSCULAR | Status: DC | PRN
  Administered 2021-02-15 (×2): 2 mL

## 2021-02-15 MED ORDER — LABETALOL HCL 5 MG/ML IV SOLN: 10.0000 mg | INTRAVENOUS | Status: DC | PRN

## 2021-02-15 MED ORDER — HEPARIN (PORCINE) IN NACL 1000-0.9 UT/500ML-% IV SOLN
INTRAVENOUS | Status: DC | PRN
  Administered 2021-02-15 (×2): 500 mL

## 2021-02-15 MED ORDER — MIDAZOLAM HCL 2 MG/2ML IJ SOLN
INTRAMUSCULAR | Status: AC
  Filled 2021-02-15: qty 2

## 2021-02-15 MED ORDER — LIDOCAINE HCL 1 % IJ SOLN
INTRAMUSCULAR | Status: AC
  Filled 2021-02-15: qty 20

## 2021-02-15 MED ORDER — VERAPAMIL HCL 2.5 MG/ML IV SOLN
INTRAVENOUS | Status: DC | PRN
  Administered 2021-02-15: 2.5 mg via INTRA_ARTERIAL

## 2021-02-15 MED ORDER — FENTANYL CITRATE (PF) 100 MCG/2ML IJ SOLN
INTRAMUSCULAR | Status: DC | PRN
  Administered 2021-02-15: 12.5 ug via INTRAVENOUS

## 2021-02-15 SURGICAL SUPPLY — 12 items
CATH BALLN WEDGE 5F 110CM (CATHETERS) ×2 IMPLANT
CATH INFINITI 5FR JK (CATHETERS) ×2 IMPLANT
DEVICE RAD TR BAND REGULAR (VASCULAR PRODUCTS) ×2 IMPLANT
DRAPE BRACHIAL (DRAPES) ×4 IMPLANT
GLIDESHEATH SLEND SS 6F .021 (SHEATH) ×2 IMPLANT
GUIDEWIRE INQWIRE 1.5J.035X260 (WIRE) ×1 IMPLANT
INQWIRE 1.5J .035X260CM (WIRE) ×2
PACK CARDIAC CATH (CUSTOM PROCEDURE TRAY) ×2 IMPLANT
PROTECTION STATION PRESSURIZED (MISCELLANEOUS) ×2
SET ATX SIMPLICITY (MISCELLANEOUS) ×2 IMPLANT
SHEATH GLIDE SLENDER 4/5FR (SHEATH) ×2 IMPLANT
STATION PROTECTION PRESSURIZED (MISCELLANEOUS) ×1 IMPLANT

## 2021-02-15 NOTE — Discharge Instructions (Signed)
Dr Gwenyth Ober office will call you next week with follow up appointment.

## 2021-02-15 NOTE — Interval H&P Note (Signed)
History and Physical Interval Note:  02/15/2021 1:26 PM  Jordan [name]  has presented today for surgery, with the diagnosis of severe aortic stenosis  The various methods of treatment have been discussed with the patient and family. After consideration of risks, benefits and other options for treatment, the patient has consented to  Procedure(s): RIGHT/LEFT HEART CATH AND CORONARY ANGIOGRAPHY (Bilateral) as a surgical intervention.  The patient's history has been reviewed, patient examined, no change in status, stable for surgery.  I have reviewed the patient's chart and labs.  Questions were answered to the patient's satisfaction.    Cath Lab Visit (complete for each Cath Lab visit)  Clinical Evaluation Leading to the Procedure:   ACS: No.  Non-ACS:    Anginal/Heart Failure Classification: NYHA class II  Anti-ischemic medical therapy: Minimal Therapy (1 class of medications)  Non-Invasive Test Results: No non-invasive testing performed  Prior CABG: No previous CABG  Leoda Smithhart

## 2021-02-18 ENCOUNTER — Encounter: Payer: Self-pay | Admitting: Internal Medicine

## 2021-02-18 ENCOUNTER — Other Ambulatory Visit: Payer: Self-pay

## 2021-02-18 DIAGNOSIS — I700 Atherosclerosis of aorta: Secondary | ICD-10-CM

## 2021-02-18 DIAGNOSIS — I350 Nonrheumatic aortic (valve) stenosis: Secondary | ICD-10-CM

## 2021-02-18 DIAGNOSIS — I77810 Thoracic aortic ectasia: Secondary | ICD-10-CM

## 2021-02-18 DIAGNOSIS — Z9889 Other specified postprocedural states: Secondary | ICD-10-CM

## 2021-02-18 NOTE — Progress Notes (Signed)
Per Dr. Rockey Situ staff message  Can we please place a referral to TAVR clinic in [facility]  Aortic valve stenosis, severe  Had catheterization completed February 15, 2021, nonobstructive disease  Thx  TGollan   Order placed for referral to [place] office to thoracic sx

## 2021-02-19 ENCOUNTER — Encounter (INDEPENDENT_AMBULATORY_CARE_PROVIDER_SITE_OTHER): Payer: Self-pay | Admitting: Vascular Surgery

## 2021-02-19 ENCOUNTER — Other Ambulatory Visit (INDEPENDENT_AMBULATORY_CARE_PROVIDER_SITE_OTHER): Payer: Self-pay | Admitting: Nurse Practitioner

## 2021-02-19 ENCOUNTER — Other Ambulatory Visit: Payer: Self-pay

## 2021-02-19 ENCOUNTER — Ambulatory Visit (INDEPENDENT_AMBULATORY_CARE_PROVIDER_SITE_OTHER): Payer: Medicare HMO | Admitting: Vascular Surgery

## 2021-02-19 ENCOUNTER — Ambulatory Visit (INDEPENDENT_AMBULATORY_CARE_PROVIDER_SITE_OTHER): Payer: Medicare HMO

## 2021-02-19 VITALS — BP 118/74 | HR 89 | Resp 16 | Wt 297.8 lb

## 2021-02-19 DIAGNOSIS — I739 Peripheral vascular disease, unspecified: Secondary | ICD-10-CM | POA: Diagnosis not present

## 2021-02-19 DIAGNOSIS — N521 Erectile dysfunction due to diseases classified elsewhere: Secondary | ICD-10-CM

## 2021-02-19 DIAGNOSIS — E1140 Type 2 diabetes mellitus with diabetic neuropathy, unspecified: Secondary | ICD-10-CM | POA: Diagnosis not present

## 2021-02-19 DIAGNOSIS — I10 Essential (primary) hypertension: Secondary | ICD-10-CM | POA: Diagnosis not present

## 2021-02-19 DIAGNOSIS — E782 Mixed hyperlipidemia: Secondary | ICD-10-CM | POA: Diagnosis not present

## 2021-02-19 NOTE — Progress Notes (Signed)
MRN : [id number]  Jordan [name] is a 80 y.o. (October 14, 1940) male who presents with chief complaint of  Chief Complaint  Patient presents with   Follow-up    Ultrasound follow up  .  History of Present Illness: Patient returns today in follow up of his PAD and leg swelling.  He is doing quite well.  No new complaints.  No significant issues or problems at current.  No disabling claudication, rest pain, or ulceration.  His swelling is under reasonably good control but he does still have some mild swelling regularly. ABIs are in the normal range today with good waveforms.  Current Outpatient Medications  Medication Sig Dispense Refill   ACCU-CHEK AVIVA PLUS test strip TEST TWO TIMES DAILY AS NEEDED AS DIRECTED 200 strip 2   Accu-Chek FastClix Lancets MISC 1 each by Subdermal route 2 (two) times daily. 102 each 2   allopurinol (ZYLOPRIM) 300 MG tablet TAKE 1 TABLET EVERY DAY 90 tablet 1   amLODipine (NORVASC) 10 MG tablet TAKE 1 TABLET EVERY DAY 90 tablet 1   aspirin EC 81 MG tablet Take 1 tablet (81 mg total) by mouth daily. Swallow whole. (Patient taking differently: Take 81 mg by mouth at bedtime. Swallow whole.)     atorvastatin (LIPITOR) 40 MG tablet TAKE 1 TABLET EVERY DAY (Patient taking differently: Take 40 mg by mouth at bedtime.) 90 tablet 1   Blood Glucose Monitoring Suppl (ACCU-CHEK AVIVA PLUS) w/Device KIT 1 each by Does not apply route 2 (two) times daily. Use as directed 100 kit 12   Cholecalciferol (VITAMIN D3 PO) Take 1 tablet by mouth every Monday, Wednesday, and Friday.     furosemide (LASIX) 20 MG tablet Take 1 tablet (20 mg total) by mouth daily. 30 tablet 5   glipiZIDE (GLUCOTROL XL) 5 MG 24 hr tablet TAKE 1 TABLET (5 MG TOTAL) BY MOUTH DAILY WITH BREAKFAST. 90 tablet 1   latanoprost (XALATAN) 0.005 % ophthalmic solution Place 1 drop into the left eye at bedtime.      metFORMIN (GLUCOPHAGE-XR) 750 MG 24 hr tablet Take 2 tablets (1,500 mg total) by mouth daily with  breakfast. 180 tablet 1   Omega-3 Fatty Acids (FISH OIL PO) Take 1 capsule by mouth 2 (two) times daily.      polyethylene glycol (MIRALAX / GLYCOLAX) packet Take 17 g by mouth at bedtime.      tadalafil (CIALIS) 10 MG tablet Take 1-2 tablets (10-20 mg total) by mouth every three (3) days as needed for erectile dysfunction. 30 tablet 0   tamsulosin (FLOMAX) 0.4 MG CAPS capsule TAKE 1 CAPSULE EVERY DAY 90 capsule 1   No current facility-administered medications for this visit.    Past Medical History:  Diagnosis Date   Aortic stenosis    Chronic kidney disease, stage II (mild)    Decreased libido    Diabetes mellitus without complication ([place])    Glaucoma    Gout 2009   Hernia 1410,3013   Hyperlipidemia    Hypertension    Inguinal hernia without mention of obstruction or gangrene, unilateral or unspecified, (not specified as recurrent)    Lumbago    Obesity    Unspecified constipation     Past Surgical History:  Procedure Laterality Date   cataract surgery     COLONOSCOPY  2012   COLONOSCOPY N/A 04/22/2019   Procedure: COLONOSCOPY;  Surgeon: Mansouraty, Telford Nab., MD;  Location: [facility];  Service: Gastroenterology;  Laterality: N/A;   EYE  SURGERY  2011   cataract   RIGHT HEART CATH AND CORONARY ANGIOGRAPHY Bilateral 02/15/2021   Procedure: RIGHT HEART CATH AND CORONARY ANGIOGRAPHY;  Surgeon: Nelva Bush, MD;  Location: [facility];  Service: Cardiovascular;  Laterality: Bilateral;   UMBILICAL HERNIA REPAIR  2011     Social History   Tobacco Use   Smoking status: Former    Packs/day: 1.00    Years: 10.00    Pack years: 10.00    Types: Cigarettes    Start date: 03/17/1965    Quit date: 11/27/1975    Years since quitting: 45.2   Smokeless tobacco: Never   Tobacco comments:    quit 40 years  Vaping Use   Vaping Use: Never used  Substance Use Topics   Alcohol use: Yes    Alcohol/week: 1.0 standard drink    Types: 1 Standard drinks or equivalent per  week    Comment: socially - 1 x year   Drug use: No      Family History  Problem Relation Age of Onset   Heart disease Father    Hypertension Father    Diabetes Sister    Diabetes Brother    Prostate cancer Brother    Diabetes Sister    Kidney disease Neg Hx    Kidney cancer Neg Hx    Bladder Cancer Neg Hx    Colon cancer Neg Hx    Esophageal cancer Neg Hx    Inflammatory bowel disease Neg Hx    Liver disease Neg Hx    Pancreatic cancer Neg Hx    Rectal cancer Neg Hx    Stomach cancer Neg Hx      Allergies  Allergen Reactions   Ace Inhibitors Other (See Comments)    angioedema    REVIEW OF SYSTEMS (Negative unless checked)   Constitutional: '[]' Weight loss  '[]' Fever  '[]' Chills Cardiac: '[]' Chest pain   '[]' Chest pressure   '[]' Palpitations   '[]' Shortness of breath when laying flat   '[]' Shortness of breath at rest   '[]' Shortness of breath with exertion. Vascular:  '[x]' Pain in legs with walking   '[]' Pain in legs at rest   '[]' Pain in legs when laying flat   '[x]' Claudication   '[]' Pain in feet when walking  '[]' Pain in feet at rest  '[]' Pain in feet when laying flat   '[]' History of DVT   '[]' Phlebitis   '[]' Swelling in legs   '[]' Varicose veins   '[]' Non-healing ulcers Pulmonary:   '[]' Uses home oxygen   '[]' Productive cough   '[]' Hemoptysis   '[]' Wheeze  '[]' COPD   '[]' Asthma Neurologic:  '[]' Dizziness  '[]' Blackouts   '[]' Seizures   '[]' History of stroke   '[]' History of TIA  '[]' Aphasia   '[]' Temporary blindness   '[]' Dysphagia   '[]' Weakness or numbness in arms   '[]' Weakness or numbness in legs Musculoskeletal:  '[x]' Arthritis   '[]' Joint swelling   '[x]' Joint pain   '[x]' Low back pain Hematologic:  '[]' Easy bruising  '[]' Easy bleeding   '[]' Hypercoagulable state   '[]' Anemic  '[]' Hepatitis Gastrointestinal:  '[]' Blood in stool   '[]' Vomiting blood  '[]' Gastroesophageal reflux/heartburn   '[]' Abdominal pain Genitourinary:  '[x]' Chronic kidney disease   '[]' Difficult urination  '[]' Frequent urination  '[]' Burning with urination   '[]' Hematuria Skin:  '[]' Rashes   '[]' Ulcers    '[]' Wounds Psychological:  '[]' History of anxiety   '[]'  History of major depression.  Physical Examination  BP 118/74 (BP Location: Left Arm)   Pulse 89   Resp 16   Wt 297 lb 12.8 oz (135.1 kg)  BMI 43.98 kg/m  Gen:  WD/WN, NAD Head: [place]/AT, No temporalis wasting. Ear/Nose/Throat: Hearing grossly intact, nares w/o erythema or drainage Eyes: Conjunctiva clear. Sclera non-icteric Neck: Supple.  Trachea midline Pulmonary:  Good air movement, no use of accessory muscles.  Cardiac: RRR, no JVD Vascular:  Vessel Right Left  Radial Palpable Palpable                          PT Palpable Palpable  DP Palpable Palpable   Gastrointestinal: soft, non-tender/non-distended. No guarding/reflex.  Musculoskeletal: M/S 5/5 throughout.  No deformity or atrophy.  Trace bilateral lower extremity edema.  Walks with a cane Neurologic: Sensation grossly intact in extremities.  Symmetrical.  Speech is fluent.  Psychiatric: Judgment intact, Mood & affect appropriate for pt's clinical situation. Dermatologic: No rashes or ulcers noted.  No cellulitis or open wounds.      Labs Recent Results (from the past 2160 hour(s))  Bladder Scan (Post Void Residual) in office     Status: None   Collection Time: 12/19/20  2:18 PM  Result Value Ref Range   Scan Result 35m   POCT HgB A1C     Status: Abnormal   Collection Time: 01/16/21 10:55 AM  Result Value Ref Range   Hemoglobin A1C 6.9 (A) 4.0 - 5.6 %   HbA1c POC (<> result, manual entry)     HbA1c, POC (prediabetic range)     HbA1c, POC (controlled diabetic range)    Lipid panel     Status: None   Collection Time: 01/16/21 11:48 AM  Result Value Ref Range   Cholesterol 122 <200 mg/dL   HDL 49 > OR = 40 mg/dL   Triglycerides 73 <150 mg/dL   LDL Cholesterol (Calc) 58 mg/dL (calc)    Comment: Reference range: <100 . Desirable range <100 mg/dL for primary prevention;   <70 mg/dL for patients with CHD or diabetic patients  with > or = 2 CHD risk  factors. .Marland KitchenLDL-C is now calculated using the Martin-Hopkins  calculation, which is a validated novel method providing  better accuracy than the Friedewald equation in the  estimation of LDL-C.  MCresenciano Genreet al. JAnnamaria Helling 21610;960(45: 2061-2068  (http://education.[employer].com/faq/FAQ164)    Total CHOL/HDL Ratio 2.5 <5.0 (calc)   Non-HDL Cholesterol (Calc) 73 <130 mg/dL (calc)    Comment: For patients with diabetes plus 1 major ASCVD risk  factor, treating to a non-HDL-C goal of <100 mg/dL  (LDL-C of <70 mg/dL) is considered a therapeutic  option.   CBC with Differential/Platelet     Status: Abnormal   Collection Time: 01/16/21 11:48 AM  Result Value Ref Range   WBC 6.2 3.8 - 10.8 Thousand/uL   RBC 3.39 (L) 4.20 - 5.80 Million/uL   Hemoglobin 10.1 (L) 13.2 - 17.1 g/dL   HCT 30.3 (L) 38.5 - 50.0 %   MCV 89.4 80.0 - 100.0 fL   MCH 29.8 27.0 - 33.0 pg   MCHC 33.3 32.0 - 36.0 g/dL   RDW 15.8 (H) 11.0 - 15.0 %   Platelets 237 140 - 400 Thousand/uL   MPV 11.3 7.5 - 12.5 fL   Neutro Abs 4,886 1,500 - 7,800 cells/uL   Lymphs Abs 806 (L) 850 - 3,900 cells/uL   Absolute Monocytes 440 200 - 950 cells/uL   Eosinophils Absolute 37 15 - 500 cells/uL   Basophils Absolute 31 0 - 200 cells/uL   Neutrophils Relative % 78.8 %   Total Lymphocyte 13.0 %  Monocytes Relative 7.1 %   Eosinophils Relative 0.6 %   Basophils Relative 0.5 %  COMPLETE METABOLIC PANEL WITH GFR     Status: Abnormal   Collection Time: 01/16/21 11:48 AM  Result Value Ref Range   Glucose, Bld 173 (H) 65 - 99 mg/dL    Comment: .            Fasting reference interval . For someone without known diabetes, a glucose value >125 mg/dL indicates that they may have diabetes and this should be confirmed with a follow-up test. .    BUN 13 7 - 25 mg/dL   Creat 0.93 0.70 - 1.28 mg/dL   eGFR 84 > OR = 60 mL/min/1.73m    Comment: The eGFR is based on the CKD-EPI 2021 equation. To calculate  the new eGFR from a previous  Creatinine or Cystatin C result, go to https://www.kidney.org/professionals/ kdoqi/gfr%5Fcalculator    BUN/Creatinine Ratio NOT APPLICABLE 6 - 22 (calc)   Sodium 134 (L) 135 - 146 mmol/L   Potassium 4.7 3.5 - 5.3 mmol/L   Chloride 102 98 - 110 mmol/L   CO2 23 20 - 32 mmol/L   Calcium 9.7 8.6 - 10.3 mg/dL   Total Protein 7.2 6.1 - 8.1 g/dL   Albumin 4.0 3.6 - 5.1 g/dL   Globulin 3.2 1.9 - 3.7 g/dL (calc)   AG Ratio 1.3 1.0 - 2.5 (calc)   Total Bilirubin 0.7 0.2 - 1.2 mg/dL   Alkaline phosphatase (APISO) 49 35 - 144 U/L   AST 18 10 - 35 U/L   ALT 11 9 - 46 U/L  Iron, TIBC and Ferritin Panel     Status: Abnormal   Collection Time: 01/16/21 11:48 AM  Result Value Ref Range   Iron 42 (L) 50 - 180 mcg/dL   TIBC 346 250 - 425 mcg/dL (calc)   %SAT 12 (L) 20 - 48 % (calc)   Ferritin 16 (L) 24 - 380 ng/mL  Microalbumin / creatinine urine ratio     Status: Abnormal   Collection Time: 01/16/21 11:48 AM  Result Value Ref Range   Creatinine, Urine 150 20 - 320 mg/dL   Microalb, Ur 4.6 mg/dL    Comment: Reference Range Not established    Microalb Creat Ratio 31 (H) <30 mcg/mg creat    Comment: . The ADA defines abnormalities in albumin excretion as follows: .Marland KitchenAlbuminuria Category        Result (mcg/mg creatinine) . Normal to Mildly increased   <30 Moderately increased         30-299  Severely increased           > OR = 300 . The ADA recommends that at least two of three specimens collected within a 3-6 month period be abnormal before considering a patient to be within a diagnostic category.   Basic metabolic panel     Status: Abnormal   Collection Time: 02/12/21  3:41 PM  Result Value Ref Range   Glucose 289 (H) 70 - 99 mg/dL   BUN 15 8 - 27 mg/dL   Creatinine, Ser 0.99 0.76 - 1.27 mg/dL   eGFR 77 >59 mL/min/1.73   BUN/Creatinine Ratio 15 10 - 24   Sodium 134 134 - 144 mmol/L   Potassium 4.5 3.5 - 5.2 mmol/L   Chloride 103 96 - 106 mmol/L   CO2 18 (L) 20 - 29 mmol/L    Calcium 9.5 8.6 - 10.2 mg/dL  CBC     Status:  Abnormal   Collection Time: 02/12/21  3:41 PM  Result Value Ref Range   WBC 5.4 3.4 - 10.8 x10E3/uL   RBC 3.89 (L) 4.14 - 5.80 x10E6/uL   Hemoglobin 10.7 (L) 13.0 - 17.7 g/dL   Hematocrit 33.9 (L) 37.5 - 51.0 %   MCV 87 79 - 97 fL   MCH 27.5 26.6 - 33.0 pg   MCHC 31.6 31.5 - 35.7 g/dL   RDW 15.2 11.6 - 15.4 %   Platelets 247 150 - 450 x10E3/uL  Glucose, capillary     Status: Abnormal   Collection Time: 02/15/21 12:26 PM  Result Value Ref Range   Glucose-Capillary 186 (H) 70 - 99 mg/dL    Comment: Glucose reference range applies only to samples taken after fasting for at least 8 hours.  Glucose, capillary     Status: Abnormal   Collection Time: 02/15/21  3:24 PM  Result Value Ref Range   Glucose-Capillary 126 (H) 70 - 99 mg/dL    Comment: Glucose reference range applies only to samples taken after fasting for at least 8 hours.    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  Otherwise, there is mild-moderate, non-obstructive coronary artery disease, as detailed below. Mildly elevated left heart and pulmonary artery pressures (PCWP 22 mmHg, mean PAP 26 mmHg). Severely elevated right heart filling pressure (mean RAP 16 mmHg, RVEDP 16 mmHg). Normal Fick cardiac output/index (CO 6.1 L/min, CI 2.5 L/min/m^2). Recommendations: Medical therapy of coronary artery disease, including aggressive secondary prevention. Consider gentle diuresis; will start furosemide 20 mg PO daily at discharge. Ongoing workup of severe aortic stenosis per Dr. Rockey Situ.  Consider referral to structural heart clinic for consideration of TAVR. Nelva Bush, MD [facility]   Assessment/Plan    Controlled type 2 diabetes mellitus with microalbuminuria (HCC) blood glucose control important in reducing the progression of atherosclerotic  disease. Also, involved in wound healing. On appropriate medications.     Hyperlipidemia lipid control important in reducing the progression of atherosclerotic disease. Continue statin therapy     Swelling of limb Currently quite mild.  Can evaluate the venous system if symptoms worsen in the future.  Essential hypertension blood pressure control important in reducing the progression of atherosclerotic disease. On appropriate oral medications.   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 Also, involved in wound healing. On appropriate medications.   Hyperlipidemia lipid control important in reducing the progression of atherosclerotic disease. Continue statin therapy   PAD (peripheral artery disease) (HCC) ABIs are in the normal range today with good waveforms.  Mild PAD without significant arterial insufficiency at current.  No change in medical regimen.  Can check annually.    Leotis Pain, MD  02/19/2021 4:00 PM    This note was created with Dragon medical transcription system.  Any errors from dictation are purely unintentional

## 2021-02-19 NOTE — Assessment & Plan Note (Signed)
ABIs are in the normal range today with good waveforms.  Mild PAD without significant arterial insufficiency at current.  No change in medical regimen.  Can check annually.

## 2021-02-19 NOTE — Assessment & Plan Note (Signed)
lipid control important in reducing the progression of atherosclerotic disease. Continue statin therapy  

## 2021-02-19 NOTE — Assessment & Plan Note (Signed)
blood glucose control important in reducing the progression of atherosclerotic disease. Also, involved in wound healing. On appropriate medications.  

## 2021-02-19 NOTE — Assessment & Plan Note (Signed)
blood pressure control important in reducing the progression of atherosclerotic disease. On appropriate oral medications.  

## 2021-02-21 ENCOUNTER — Ambulatory Visit: Payer: Medicare HMO | Admitting: Podiatry

## 2021-02-21 ENCOUNTER — Other Ambulatory Visit: Payer: Self-pay

## 2021-02-21 ENCOUNTER — Encounter: Payer: Self-pay | Admitting: Podiatry

## 2021-02-21 ENCOUNTER — Encounter: Payer: Self-pay | Admitting: Cardiology

## 2021-02-21 DIAGNOSIS — M79676 Pain in unspecified toe(s): Secondary | ICD-10-CM

## 2021-02-21 DIAGNOSIS — E1142 Type 2 diabetes mellitus with diabetic polyneuropathy: Secondary | ICD-10-CM

## 2021-02-21 DIAGNOSIS — L84 Corns and callosities: Secondary | ICD-10-CM | POA: Diagnosis not present

## 2021-02-21 DIAGNOSIS — I739 Peripheral vascular disease, unspecified: Secondary | ICD-10-CM | POA: Diagnosis not present

## 2021-02-21 DIAGNOSIS — N1831 Chronic kidney disease, stage 3a: Secondary | ICD-10-CM | POA: Diagnosis not present

## 2021-02-21 DIAGNOSIS — B351 Tinea unguium: Secondary | ICD-10-CM

## 2021-02-21 DIAGNOSIS — I350 Nonrheumatic aortic (valve) stenosis: Secondary | ICD-10-CM

## 2021-02-21 NOTE — Progress Notes (Signed)
Complaint:  Visit Type: Patient returns to my office for risk foot care.  This patient requires this care by a professional since this patient will be at a high risk due to having diabetes.  This patient is unable to cut his own nails since he cannot reach his nails.  This patient presents for at risk foot care today.  He also states that he has a painful corn on the second toe right foot.  Podiatric Exam: Vascular: dorsalis pedis and posterior tibial pulses are  weakly palpable bilateral. Capillary return is immediate. Temperature gradient is WNL. Skin turgor WNL  Sensorium: Normal Semmes Weinstein monofilament test. Normal tactile sensation bilaterally. Nail Exam: Pt has thick disfigured discolored nails with subungual debris noted bilateral entire nail hallux through fifth toenails.  Nails are thick painful and deformed 1-5  B/L. Ulcer Exam: There is no evidence of ulcer or pre-ulcerative changes or infection. Orthopedic Exam: Muscle tone and strength are WNL. No limitations in general ROM. No crepitus or effusions noted. HAV  B/L. Hammer toes second  B/L Skin: No Porokeratosis. No infection or ulcers.  Corn second toe medial aspect @ PIPJ. Right foot.  Diagnosis:  Onychomycosis, , Pain in right toe, pain in left toes, Corn/callus  Treatment & Plan Procedures and Treatment: Consent by patient was obtained for treatment procedures. The patient understood the discussion of treatment and procedures well. All questions were answered thoroughly reviewed. Debridement of mycotic and hypertrophic toenails, 1 through 5 bilateral and clearing of subungual debris. No ulceration, no infection noted.   Told this patient to return for periodic foot evaluation to help reduce potential of at risk complications.  Gave him  pads due to corn. Return Visit-Office Procedure: Patient instructed to return to the office for a follow up visit 3 months for continued evaluation and treatment.    Gardiner Barefoot DPM

## 2021-03-15 ENCOUNTER — Other Ambulatory Visit: Payer: Self-pay

## 2021-03-15 ENCOUNTER — Ambulatory Visit (HOSPITAL_COMMUNITY): Payer: Medicare HMO | Attending: Cardiovascular Disease

## 2021-03-15 ENCOUNTER — Encounter: Payer: Self-pay | Admitting: Cardiovascular Disease

## 2021-03-15 ENCOUNTER — Ambulatory Visit: Payer: Medicare HMO | Admitting: Cardiovascular Disease

## 2021-03-15 VITALS — BP 122/68 | HR 92 | Ht 69.0 in | Wt 295.0 lb

## 2021-03-15 DIAGNOSIS — I350 Nonrheumatic aortic (valve) stenosis: Secondary | ICD-10-CM | POA: Diagnosis not present

## 2021-03-15 LAB — ECHOCARDIOGRAM COMPLETE
AR max vel: 1.44 cm2
AV Area VTI: 1.37 cm2
AV Area mean vel: 1.39 cm2
AV Mean grad: 35 mmHg
AV Peak grad: 54.5 mmHg
Ao pk vel: 3.69 m/s
Area-P 1/2: 3.93 cm2
S' Lateral: 2.9 cm

## 2021-03-15 NOTE — Patient Instructions (Signed)
Medication Instructions:  Your physician recommends that you continue on your current medications as directed. Please refer to the Current Medication list given to you today.  *If you need a refill on your cardiac medications before your next appointment, please call your pharmacy*   Lab Work: None today  If you have labs (blood work) drawn today and your tests are completely normal, you will receive your results only by: [place] (if you have MyChart) OR A paper copy in the mail If you have any lab test that is abnormal or we need to change your treatment, we will call you to review the results.   Testing/Procedures: TAVR CT-someone from the structural heart team will contact you.    1}

## 2021-03-15 NOTE — Progress Notes (Signed)
Pre Surgical Assessment: 5 M Walk Test  62M=16.37ft  5 Meter Walk Test- trial 1: 11 seconds 5 Meter Walk Test- trial 2: 9 seconds 5 Meter Walk Test- trial 3: 10 seconds 5 Meter Walk Test Average: 10.09 seconds

## 2021-03-15 NOTE — Progress Notes (Signed)
Cardiology Office Note:    Date:  03/15/2021   ID:  Jordan [name], DOB 05/16/1940, MRN [id number]  PCP:  Steele Sizer, MD   [facility] Providers Cardiologist:  None     Referring MD: Steele Sizer, MD   Chief Complaint  Patient presents with   Aortic Stenosis         History of Present Illness:    Jordan [name] is a 80 y.o. male referred by Dr. Rockey Situ for evaluation of aortic stenosis.  The patient has been followed for aortic stenosis for several years, but his most recent echoes have shown progression now into the severe range.  His medical history is pertinent for the presence of diabetes, hypertension, and morbid obesity.  Over the past year, the patient has developed progressive exertional dyspnea without chest pain or pressure.  He denies lightheadedness or syncope.  He is now short of breath with low-level activities, previously able to walk without difficulty.  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  He retired at age 26.  He is originally from [place].  He is limited by leg weakness and knee problems.  He ambulates with a cane.  Reports that he has had abdominal hernia surgeries, but really has not had any other significant surgeries or hospitalizations.  The patient's diabetes is managed with oral hypoglycemic agents.  His most recent echocardiogram in January 2022 demonstrated low normal LVEF of 50 to 55% with severe aortic stenosis, mean transaortic gradient of 47 mmHg, peak gradient 79 mmHg, and dimensionless index 0.22.  Past Medical History:  Diagnosis Date   Aortic stenosis    Chronic kidney disease, stage II (mild)    Decreased libido    Diabetes mellitus without complication ([place])    Glaucoma    Gout 2009   Hernia 2025,4270   Hyperlipidemia    Hypertension    Inguinal hernia without mention of obstruction or gangrene, unilateral or unspecified, (not specified as recurrent)    Lumbago    Obesity    Unspecified  constipation     Past Surgical History:  Procedure Laterality Date   cataract surgery     COLONOSCOPY  2012   COLONOSCOPY N/A 04/22/2019   Procedure: COLONOSCOPY;  Surgeon: Mansouraty, Telford Nab., MD;  Location: [facility];  Service: Gastroenterology;  Laterality: N/A;   EYE SURGERY  2011   cataract   RIGHT HEART CATH AND CORONARY ANGIOGRAPHY Bilateral 02/15/2021   Procedure: RIGHT HEART CATH AND CORONARY ANGIOGRAPHY;  Surgeon: Nelva Bush, MD;  Location: [facility];  Service: Cardiovascular;  Laterality: Bilateral;   UMBILICAL HERNIA REPAIR  2011    Current Medications: Current Meds  Medication Sig   ACCU-CHEK AVIVA PLUS test strip TEST TWO TIMES DAILY AS NEEDED AS DIRECTED   Accu-Chek FastClix Lancets MISC 1 each by Subdermal route 2 (two) times daily.   allopurinol (ZYLOPRIM) 300 MG tablet TAKE 1 TABLET EVERY DAY   amLODipine (NORVASC) 10 MG tablet TAKE 1 TABLET EVERY DAY   aspirin EC 81 MG tablet Take 1 tablet (81 mg total) by mouth daily. Swallow whole. (Patient taking differently: Take 81 mg by mouth at bedtime. Swallow whole.)   atorvastatin (LIPITOR) 40 MG tablet TAKE 1 TABLET EVERY DAY (Patient taking differently: Take 40 mg by mouth at bedtime.)   Blood Glucose Monitoring Suppl (ACCU-CHEK AVIVA PLUS) w/Device KIT 1 each by Does not apply route 2 (two) times daily. Use as directed  Cholecalciferol (VITAMIN D3 PO) Take 1 tablet by mouth every Monday, Wednesday, and Friday.   furosemide (LASIX) 20 MG tablet Take 1 tablet (20 mg total) by mouth daily.   glipiZIDE (GLUCOTROL XL) 5 MG 24 hr tablet TAKE 1 TABLET (5 MG TOTAL) BY MOUTH DAILY WITH BREAKFAST.   latanoprost (XALATAN) 0.005 % ophthalmic solution Place 1 drop into the left eye at bedtime.    metFORMIN (GLUCOPHAGE-XR) 750 MG 24 hr tablet Take 2 tablets (1,500 mg total) by mouth daily with breakfast.   Omega-3 Fatty Acids (FISH OIL PO) Take 1 capsule by mouth 2 (two) times daily.    polyethylene glycol  (MIRALAX / GLYCOLAX) packet Take 17 g by mouth at bedtime.    tadalafil (CIALIS) 10 MG tablet Take 1-2 tablets (10-20 mg total) by mouth every three (3) days as needed for erectile dysfunction.   tamsulosin (FLOMAX) 0.4 MG CAPS capsule TAKE 1 CAPSULE EVERY DAY     Allergies:   Ace inhibitors   Social History   Socioeconomic History   Marital status: Widowed    Spouse name: Not on file   Number of children: 2   Years of education: Not on file   Highest education level: Some college, no degree  Occupational History   Occupation: retired     Comment: used to [employer]   Tobacco Use   Smoking status: Former    Packs/day: 1.00    Years: 10.00    Pack years: 10.00    Types: Cigarettes    Start date: 03/17/1965    Quit date: 11/27/1975    Years since quitting: 45.3   Smokeless tobacco: Never   Tobacco comments:    quit 40 years  Vaping Use   Vaping Use: Never used  Substance and Sexual Activity   Alcohol use: Yes    Alcohol/week: 1.0 standard drink    Types: 1 Standard drinks or equivalent per week    Comment: socially - 1 x year   Drug use: No   Sexual activity: Yes    Partners: Female  Other Topics Concern   Not on file  Social History Narrative   Lives by himself in [place]   Two grown children, daughter in [place] and son in [place]   He goes to [place] to visit his sisters often    Social Determinants of Radio broadcast assistant Strain: Low Risk    Difficulty of Paying Living Expenses: Not hard at all  Food Insecurity: No Food Insecurity   Worried About Charity fundraiser in the Last Year: Never true   Arboriculturist in the Last Year: Never true  Transportation Needs: No Transportation Needs   Lack of Transportation (Medical): No   Lack of Transportation (Non-Medical): No  Physical Activity: Sufficiently Active   Days of Exercise per Week: 5 days   Minutes of Exercise per Session: 30 min  Stress: No Stress Concern Present   Feeling of  Stress : Not at all  Social Connections: Moderately Integrated   Frequency of Communication with Friends and Family: More than three times a week   Frequency of Social Gatherings with Friends and Family: More than three times a week   Attends Religious Services: More than 4 times per year   Active Member of [employer] or Organizations: Yes   Attends Archivist Meetings: More than 4 times per year   Marital Status: Widowed     Family History: The patient's family history includes  Diabetes in his brother, sister, and sister; Heart disease in his father; Hypertension in his father; Prostate cancer in his brother. There is no history of Kidney disease, Kidney cancer, Bladder Cancer, Colon cancer, Esophageal cancer, Inflammatory bowel disease, Liver disease, Pancreatic cancer, Rectal cancer, or Stomach cancer.  ROS:   Please see the history of present illness.    All other systems reviewed and are negative.  EKGs/Labs/Other Studies Reviewed:    The following studies were reviewed today: Cardiac Cath 02/15/21: Conclusions: 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  Otherwise, there is mild-moderate, non-obstructive coronary artery disease, as detailed below. Mildly elevated left heart and pulmonary artery pressures (PCWP 22 mmHg, mean PAP 26 mmHg). Severely elevated right heart filling pressure (mean RAP 16 mmHg, RVEDP 16 mmHg). Normal Fick cardiac output/index (CO 6.1 L/min, CI 2.5 L/min/m^2).   Recommendations: Medical therapy of coronary artery disease, including aggressive secondary prevention. Consider gentle diuresis; will start furosemide 20 mg PO daily at discharge. Ongoing workup of severe aortic stenosis per Dr. Rockey Situ.  Consider referral to structural heart clinic for consideration of TAVR.  EKG:  EKG is not ordered today.  The ekg from 02/12/2021 shows normal sinus rhythm with incomplete right bundle branch  block.  Recent Labs: 01/16/2021: ALT 11 02/12/2021: BUN 15; Creatinine, Ser 0.99; Hemoglobin 10.7; Platelets 247; Potassium 4.5; Sodium 134  Recent Lipid Panel    Component Value Date/Time   CHOL 122 01/16/2021 1148   CHOL 131 08/27/2015 1158   TRIG 73 01/16/2021 1148   HDL 49 01/16/2021 1148   HDL 40 08/27/2015 1158   CHOLHDL 2.5 01/16/2021 1148   VLDL 20 07/28/2016 1248   LDLCALC 58 01/16/2021 1148     Risk Assessment/Calculations:     STS risk calculator: Aortic Valve Replacement (Isolated AVR): Risk of Mortality: 1.950% Renal Failure: 4.243% Permanent Stroke: 0.950% Prolonged Ventilation: 10.738% DSW Infection: 0.257% Reoperation: 3.172% Morbidity or Mortality: 15.540% Short Length of Stay: 25.728% Long Length of Stay: 8.076%       Physical Exam:    VS:  BP 122/68    Pulse 92    Ht _0  (1.753 m)    Wt 295 lb (133.8 kg)    SpO2 98%    BMI 43.56 kg/m     Wt Readings from Last 3 Encounters:  03/15/21 295 lb (133.8 kg)  02/19/21 297 lb 12.8 oz (135.1 kg)  02/15/21 298 lb 8.1 oz (135.4 kg)     GEN: Morbidly obese African-American male in no acute distress HEENT: Normal NECK: No JVD; No carotid bruits LYMPHATICS: No lymphadenopathy CARDIAC: RRR, 3/6 harsh crescendo decrescendo murmur at the right upper sternal border, distant heart sounds RESPIRATORY:  Clear to auscultation without rales, wheezing or rhonchi  ABDOMEN: Soft, non-tender, non-distended MUSCULOSKELETAL: Trace bilateral pretibial edema; No deformity  SKIN: Warm and dry NEUROLOGIC:  Alert and oriented x 3 PSYCHIATRIC:  Normal affect   ASSESSMENT:    1. Nonrheumatic aortic valve stenosis    PLAN:    In order of problems listed above:  The patient has severe, symptomatic, stage D1 aortic stenosis with New York Heart Association functional class III symptoms of progressive exertional dyspnea/chronic diastolic heart failure.  The patient's echocardiogram is reviewed and demonstrates low  normal LVEF with severe calcification and restriction of the aortic valve leaflets.  His acoustic windows are difficult.  I reviewed his images from his January 2022 echo when echo contrast was used.  This showed  peak and mean transvalvular gradients of 79 and 47 mmHg, respectively.  Today's echo images are reviewed with the formal interpretation currently pending.  The transvalvular gradients are lower but the acoustic windows are very poor.  His exam, progressive symptoms, and previous echo study are all consistent and diagnostic of severe aortic stenosis.  I have reviewed the natural history of aortic stenosis with the patient today.  We discussed complications of the disease process.  We reviewed potential treatment options including palliative medical therapy, conventional surgery, and TAVR.  I have recommended the patient undergo a gated cardiac CTA study and a CTA of the chest, abdomen, and pelvis for further evaluation.  As long as his anatomy is amenable to TAVR, this would be the preferred treatment option for him in the context of his advanced age, morbid obesity, degenerative arthritis with reduced functional capacity, and diabetes.  Once his CTA studies are completed, he will be referred for formal cardiac surgical consultation as part of a multidisciplinary evaluation.      Medication Adjustments/Labs and Tests Ordered: Current medicines are reviewed at length with the patient today.  Concerns regarding medicines are outlined above.  No orders of the defined types were placed in this encounter.  No orders of the defined types were placed in this encounter.   Patient Instructions  Medication Instructions:  Your physician recommends that you continue on your current medications as directed. Please refer to the Current Medication list given to you today.  *If you need a refill on your cardiac medications before your next appointment, please call your pharmacy*   Lab Work: None today   If you have labs (blood work) drawn today and your tests are completely normal, you will receive your results only by: [place] (if you have MyChart) OR A paper copy in the mail If you have any lab test that is abnormal or we need to change your treatment, we will call you to review the results.   Testing/Procedures: TAVR CT-someone from the structural heart team will contact you.    1}       Signed, Sherren Mocha, MD  03/15/2021 5:05 PM    [place]

## 2021-03-19 ENCOUNTER — Other Ambulatory Visit: Payer: Self-pay

## 2021-03-19 ENCOUNTER — Encounter: Payer: Self-pay | Admitting: Gastroenterology

## 2021-03-19 ENCOUNTER — Ambulatory Visit (INDEPENDENT_AMBULATORY_CARE_PROVIDER_SITE_OTHER): Payer: Medicare HMO | Admitting: Gastroenterology

## 2021-03-19 VITALS — BP 147/81 | HR 94 | Temp 98.3°F | Ht 69.0 in | Wt 299.0 lb

## 2021-03-19 DIAGNOSIS — K625 Hemorrhage of anus and rectum: Secondary | ICD-10-CM | POA: Diagnosis not present

## 2021-03-19 DIAGNOSIS — I350 Nonrheumatic aortic (valve) stenosis: Secondary | ICD-10-CM

## 2021-03-19 NOTE — Progress Notes (Signed)
Gastroenterology Consultation  Referring Provider:     Steele Sizer, MD Primary Care Physician:  Steele Sizer, MD Primary Gastroenterologist:  Dr. Allen Norris     Reason for Consultation:     Rectal bleeding        HPI:   Jordan [name] is a 81 y.o. y/o male referred for consultation & management of rectal bleeding by Dr. Ancil Boozer, Drue Stager, MD. This patient comes to see me after being seen for GI bleeding in [facility] and having a colonoscopy and February 2021 by Dr. Rush Landmark.  That time the patient had a poor prep with the reporting of a redundant colon and fair visualization.  The patient was found to have diverticulosis and internal hemorrhoids.  The bleeding was reported on the report to likely have been diverticular in nature.  The patient has been seen in November by his primary care provider and had reported that a few weeks prior he had 1 week bright red blood per rectum.  The patient states he has had no further signs of any bleeding.  He also denies any unexplained weight loss fevers chills nausea vomiting black stools or bloody stools.  Past Medical History:  Diagnosis Date   Aortic stenosis    Chronic kidney disease, stage II (mild)    Decreased libido    Diabetes mellitus without complication ([place])    Glaucoma    Gout 2009   Hernia 5732,2025   Hyperlipidemia    Hypertension    Inguinal hernia without mention of obstruction or gangrene, unilateral or unspecified, (not specified as recurrent)    Lumbago    Obesity    Unspecified constipation     Past Surgical History:  Procedure Laterality Date   cataract surgery     COLONOSCOPY  2012   COLONOSCOPY N/A 04/22/2019   Procedure: COLONOSCOPY;  Surgeon: Mansouraty, Telford Nab., MD;  Location: [facility];  Service: Gastroenterology;  Laterality: N/A;   EYE SURGERY  2011   cataract   RIGHT HEART CATH AND CORONARY ANGIOGRAPHY Bilateral 02/15/2021   Procedure: RIGHT HEART CATH AND CORONARY ANGIOGRAPHY;  Surgeon: Nelva Bush, MD;  Location: [facility];  Service: Cardiovascular;  Laterality: Bilateral;   UMBILICAL HERNIA REPAIR  2011    Prior to Admission medications   Medication Sig Start Date End Date Taking? Authorizing Provider  ACCU-CHEK AVIVA PLUS test strip TEST TWO TIMES DAILY AS NEEDED AS DIRECTED 09/10/20   Steele Sizer, MD  Accu-Chek FastClix Lancets MISC 1 each by Subdermal route 2 (two) times daily. 11/13/20   Steele Sizer, MD  allopurinol (ZYLOPRIM) 300 MG tablet TAKE 1 TABLET EVERY DAY 11/13/20   Ancil Boozer, Drue Stager, MD  amLODipine (NORVASC) 10 MG tablet TAKE 1 TABLET EVERY DAY 11/13/20   Steele Sizer, MD  aspirin EC 81 MG tablet Take 1 tablet (81 mg total) by mouth daily. Swallow whole. Patient taking differently: Take 81 mg by mouth at bedtime. Swallow whole. 09/23/19   Loel Dubonnet, NP  atorvastatin (LIPITOR) 40 MG tablet TAKE 1 TABLET EVERY DAY Patient taking differently: Take 40 mg by mouth at bedtime. 11/13/20   Steele Sizer, MD  Blood Glucose Monitoring Suppl (ACCU-CHEK AVIVA PLUS) w/Device KIT 1 each by Does not apply route 2 (two) times daily. Use as directed 04/01/19   Steele Sizer, MD  Cholecalciferol (VITAMIN D3 PO) Take 1 tablet by mouth every Monday, Wednesday, and Friday.    [provider]  furosemide (LASIX) 20 MG tablet Take 1 tablet (20  mg total) by mouth daily. 02/15/21 02/15/22  End, Harrell Gave, MD  glipiZIDE (GLUCOTROL XL) 5 MG 24 hr tablet TAKE 1 TABLET (5 MG TOTAL) BY MOUTH DAILY WITH BREAKFAST. 11/13/20   Steele Sizer, MD  latanoprost (XALATAN) 0.005 % ophthalmic solution Place 1 drop into the left eye at bedtime.  06/18/15   [provider]  metFORMIN (GLUCOPHAGE-XR) 750 MG 24 hr tablet Take 2 tablets (1,500 mg total) by mouth daily with breakfast. 02/18/21   End, Harrell Gave, MD  Omega-3 Fatty Acids (FISH OIL PO) Take 1 capsule by mouth 2 (two) times daily.     [provider]  polyethylene glycol (MIRALAX / GLYCOLAX)  packet Take 17 g by mouth at bedtime.     [provider]  tadalafil (CIALIS) 10 MG tablet Take 1-2 tablets (10-20 mg total) by mouth every three (3) days as needed for erectile dysfunction. 04/24/20   Steele Sizer, MD  tamsulosin (FLOMAX) 0.4 MG CAPS capsule TAKE 1 CAPSULE EVERY DAY 11/13/20   Steele Sizer, MD    Family History  Problem Relation Age of Onset   Heart disease Father    Hypertension Father    Diabetes Sister    Diabetes Brother    Prostate cancer Brother    Diabetes Sister    Kidney disease Neg Hx    Kidney cancer Neg Hx    Bladder Cancer Neg Hx    Colon cancer Neg Hx    Esophageal cancer Neg Hx    Inflammatory bowel disease Neg Hx    Liver disease Neg Hx    Pancreatic cancer Neg Hx    Rectal cancer Neg Hx    Stomach cancer Neg Hx      Social History   Tobacco Use   Smoking status: Former    Packs/day: 1.00    Years: 10.00    Pack years: 10.00    Types: Cigarettes    Start date: 03/17/1965    Quit date: 11/27/1975    Years since quitting: 45.3   Smokeless tobacco: Never   Tobacco comments:    quit 40 years  Vaping Use   Vaping Use: Never used  Substance Use Topics   Alcohol use: Yes    Alcohol/week: 1.0 standard drink    Types: 1 Standard drinks or equivalent per week    Comment: socially - 1 x year   Drug use: No    Allergies as of 03/19/2021 - Review Complete 03/15/2021  Allergen Reaction Noted   Ace inhibitors Other (See Comments) 10/26/2015    Review of Systems:    All systems reviewed and negative except where noted in HPI.   Physical Exam:  There were no vitals taken for this visit. No LMP for male patient. General:   Alert,  Well-developed, well-nourished, pleasant and cooperative in NAD Head:  Normocephalic and atraumatic. Eyes:  Sclera clear, no icterus.   Conjunctiva pink. Ears:  Normal auditory acuity. Neck:  Supple; no masses or thyromegaly. Lungs:  Respirations even and unlabored.  Clear throughout to auscultation.    No wheezes, crackles, or rhonchi. No acute distress. Heart:  Regular rate and rhythm; no murmurs, clicks, rubs, or gallops. Abdomen:  Normal bowel sounds.  No bruits.  Soft, non-tender and non-distended without masses, hepatosplenomegaly or hernias noted.  No guarding or rebound tenderness.  Negative Carnett sign.   Rectal:  Deferred.  Pulses:  Normal pulses noted. Extremities:  No clubbing or edema.  No cyanosis. Neurologic:  Alert and oriented x3;  grossly normal neurologically. Skin:  Intact without significant lesions or rashes.  No jaundice. Lymph Nodes:  No significant cervical adenopathy. Psych:  Alert and cooperative. Normal mood and affect.  Imaging Studies: VAS [name] ABI WITH/WO TBI  Result Date: 02/22/2021  LOWER EXTREMITY DOPPLER STUDY Patient Name:  Jordan [name]  Date of Exam:   02/19/2021 Medical Rec #: [id number]          Accession #:    [id number] Date of Birth: 09-22-1940          Patient Gender: M Patient Age:   26 years Exam Location:  [facility] Procedure:      VAS [name] ABI WITH/WO TBI Referring Phys: Eulogio Ditch --------------------------------------------------------------------------------  Indications: Claudication.  Performing Technologist: Blondell Reveal RT, RDMS, RVT  Examination Guidelines: A complete evaluation includes at minimum, Doppler waveform signals and systolic blood pressure reading at the level of bilateral brachial, anterior tibial, and posterior tibial arteries, when vessel segments are accessible. Bilateral testing is considered an integral part of a complete examination. Photoelectric Plethysmograph (PPG) waveforms and toe systolic pressure readings are included as required and additional duplex testing as needed. Limited examinations for reoccurring indications may be performed as noted.  ABI Findings: +---------+------------------+-----+--------+--------+  Right     Rt Pressure (mmHg) Index Waveform Comment    +---------+------------------+-----+--------+--------+  Brachial  146                                         +---------+------------------+-----+--------+--------+  ATA       153                1.05  biphasic           +---------+------------------+-----+--------+--------+  PTA       173                1.18  biphasic           +---------+------------------+-----+--------+--------+  Great Toe 111                0.76  Normal             +---------+------------------+-----+--------+--------+ +---------+------------------+-----+--------+-------+  Left      Lt Pressure (mmHg) Index Waveform Comment  +---------+------------------+-----+--------+-------+  Brachial  138                                        +---------+------------------+-----+--------+-------+  ATA       144                0.99  biphasic          +---------+------------------+-----+--------+-------+  PTA       153                1.05  biphasic          +---------+------------------+-----+--------+-------+  Great Toe 122                0.84  Normal            +---------+------------------+-----+--------+-------+ +-------+-----------+-----------+------------+------------+  ABI/TBI Today's ABI Today's TBI Previous ABI Previous TBI  +-------+-----------+-----------+------------+------------+  Right   1.18        0.76        1.16         not done      +-------+-----------+-----------+------------+------------+  Left    1.05        0.84        1.06         not done      +-------+-----------+-----------+------------+------------+ Bilateral ABIs and TBIs appear essentially unchanged compared to prior study on 02/15/20.  Summary: Bilateral: Bilateral ankle-brachial indexes are within normal range. No evidence of significant lower extremity arterial disease. Bilateral toe-brachial indexes are within normal range.  *See table(s) above for measurements and observations. Electronically signed by Leotis Pain MD on 02/22/2021 at 9:03:55 AM.    Final    ECHOCARDIOGRAM  COMPLETE  Result Date: 03/15/2021    ECHOCARDIOGRAM REPORT   Patient Name:   Jordan [name] Date of Exam: 03/15/2021 Medical Rec #:  [id number]         Height:       69.0 in Accession #:    [id number]        Weight:       297.8 lb Date of Birth:  [birth year]         BSA:          2.447 m Patient Age:    29 years          BP:           118/74 mmHg Patient Gender: M                 HR:           84 bpm. Exam Location:  [place] Procedure: 2D Echo, Cardiac Doppler and Color Doppler Indications:    I35.9 Aortic stenosis  History:        Patient has prior history of Echocardiogram examinations. PAD,                 Aortic Valve Disease; Risk Factors:Diabetes, Dyslipidemia and                 Morbid obesity. Previous echo revealed LVEF 55% sevre AS with                 mean of 47.  Sonographer:    Lenard Galloway BA, RDCS Referring Phys: [place]  1. Left ventricular ejection fraction, by estimation, is 50 to 55%. Left ventricular ejection fraction by PLAX is 63 %. The left ventricle has low normal function. The left ventricle has no regional wall motion abnormalities. Left ventricular diastolic parameters are consistent with Grade I diastolic dysfunction (impaired relaxation). Elevated left ventricular end-diastolic pressure.  2. Right ventricular systolic function is normal. The right ventricular size is normal.  3. The mitral valve is normal in structure. No evidence of mitral valve regurgitation. No evidence of mitral stenosis.  4. 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 The aortic valve is calcified. There is moderate calcification of the aortic valve. There is moderate thickening of the aortic valve. Aortic valve regurgitation is not visualized. Moderate aortic valve stenosis. Aortic valve area, by VTI measures 1.37 cm. Aortic valve mean gradient measures 35.0 mmHg. Aortic valve Vmax measures 3.69 m/s.  5. Aortic dilatation noted. 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  6. The inferior vena cava is normal in size with greater than 50% respiratory variability, suggesting right atrial pressure of 3 mmHg. FINDINGS  Left Ventricle: Left ventricular ejection fraction, by estimation, is 50 to 55%. Left ventricular ejection fraction by PLAX is 63 %. The  left ventricle has low normal function. The left ventricle has no regional wall motion abnormalities. The left ventricular internal cavity size was normal in size. There is no left ventricular hypertrophy. Left ventricular diastolic parameters are consistent with Grade I diastolic dysfunction (impaired relaxation). Elevated left ventricular end-diastolic pressure. Right Ventricle: The right ventricular size is normal. No increase in right ventricular wall thickness. Right ventricular systolic function is normal. Left Atrium: Left atrial size was normal in size. Right Atrium: Right atrial size was normal in size. Pericardium: There is no evidence of pericardial effusion. Mitral Valve: The mitral valve is normal in structure. No evidence of mitral valve regurgitation. No evidence of mitral valve stenosis. Tricuspid Valve: The tricuspid valve is normal in structure. Tricuspid valve regurgitation is trivial. No evidence of tricuspid stenosis. 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 The aortic valve is calcified. There is moderate calcification of the aortic valve. There is moderate thickening of the aortic valve. Aortic valve regurgitation is not visualized. Moderate aortic stenosis is present. Aortic valve mean gradient measures 35.0 mmHg. Aortic valve peak gradient measures 54.5 mmHg. Aortic valve area, by VTI measures 1.37 cm. Pulmonic Valve: The pulmonic valve was normal in structure. Pulmonic valve regurgitation is not visualized. No evidence of pulmonic stenosis. Aorta: Aortic dilatation noted. There is mild dilatation of the aortic  root, measuring 39 mm. There is mild dilatation of the ascending aorta, measuring 41 mm. Venous: The inferior vena cava is normal in size with greater than 50% respiratory variability, suggesting right atrial pressure of 3 mmHg. IAS/Shunts: No atrial level shunt detected by color flow Doppler.  LEFT VENTRICLE PLAX 2D LV EF:         Left            Diastology                ventricular     LV e' medial:    3.81 cm/s                ejection        LV E/e' medial:  16.4                fraction by     LV e' lateral:   6.42 cm/s                PLAX is 63      LV E/e' lateral: 9.8                %. LVIDd:         4.40 cm LVIDs:         2.90 cm LV PW:         1.40 cm LV IVS:        1.40 cm LVOT diam:     2.70 cm LV SV:         104 LV SV Index:   43 LVOT Area:     5.73 cm  RIGHT VENTRICLE RV Basal diam:  3.40 cm RV Mid diam:    4.00 cm RV S prime:     13.50 cm/s TAPSE (M-mode): 2.1 cm LEFT ATRIUM             Index        RIGHT ATRIUM           Index LA diam:        4.70 cm 1.92 cm/m   RA Area:     15.10 cm LA Vol (  A2C):   68.0 ml 27.79 ml/m  RA Volume:   33.00 ml  13.49 ml/m LA Vol (A4C):   33.9 ml 13.85 ml/m LA Biplane Vol: 51.3 ml 20.97 ml/m  AORTIC VALVE AV Area (Vmax):    1.44 cm AV Area (Vmean):   1.39 cm AV Area (VTI):     1.37 cm AV Vmax:           369.00 cm/s AV Vmean:          273.000 cm/s AV VTI:            0.762 m AV Peak Grad:      54.5 mmHg AV Mean Grad:      35.0 mmHg LVOT Vmax:         92.50 cm/s LVOT Vmean:        66.400 cm/s LVOT VTI:          0.182 m LVOT/AV VTI ratio: 0.24  AORTA Ao Root diam: 3.90 cm Ao Asc diam:  4.10 cm MITRAL VALVE                TRICUSPID VALVE MV Area (PHT): 3.93 cm     TR Peak grad:   18.7 mmHg MV Decel Time: 193 msec     TR Vmax:        216.00 cm/s MV E velocity: 62.60 cm/s MV A velocity: 107.00 cm/s  SHUNTS MV E/A ratio:  0.59         Systemic VTI:  0.18 m                             Systemic Diam: 2.70 cm Skeet Latch MD Electronically signed by Skeet Latch  MD Signature Date/Time: 03/15/2021/6:50:02 PM    Final     Assessment and Plan:   Jordan [name] is a 81 y.o. y/o male who comes in today with a history of rectal bleeding with admission to the hospital on a colonoscopy showing diverticulosis and internal hemorrhoids.  The patient has not had any further bleeding since October.  The patient has no worry symptoms and had a colonoscopy in February 2021.  The patient has been told that his rectal bleeding is likely either diverticuli or internal hemorrhoids like when he was in the hospital.  I do not recommend any further treatment unless his symptoms recur.  The patient has been explained the plan and agrees with it.    Lucilla Lame, MD. Marval Regal    Note: This dictation was prepared with Dragon dictation along with smaller phrase technology. Any transcriptional errors that result from this process are unintentional.

## 2021-03-22 ENCOUNTER — Ambulatory Visit: Payer: Medicare HMO | Admitting: Cardiovascular Disease

## 2021-03-22 ENCOUNTER — Encounter: Payer: Self-pay | Admitting: Cardiovascular Disease

## 2021-03-22 ENCOUNTER — Other Ambulatory Visit (INDEPENDENT_AMBULATORY_CARE_PROVIDER_SITE_OTHER): Payer: Medicare HMO

## 2021-03-22 ENCOUNTER — Other Ambulatory Visit: Payer: Self-pay

## 2021-03-22 VITALS — BP 110/72 | HR 89 | Ht 69.0 in | Wt 296.0 lb

## 2021-03-22 DIAGNOSIS — Z79899 Other long term (current) drug therapy: Secondary | ICD-10-CM

## 2021-03-22 DIAGNOSIS — Z01810 Encounter for preprocedural cardiovascular examination: Secondary | ICD-10-CM | POA: Diagnosis not present

## 2021-03-22 MED ORDER — METOPROLOL TARTRATE 100 MG PO TABS: ORAL_TABLET | ORAL | 0 refills | Status: DC

## 2021-03-22 NOTE — Patient Instructions (Addendum)
You are scheduled for CT scans on Tuesday, March 26, 2021 at [facility] (Main Entrance A, [address] Parking). Please check-in at Admitting and then proceed to Radiology (first floor) for check in at 10:00 AM.    Your CT scans (of chest/abdomen/pelvis/heart) will begin at 10:30 AM. After CT scans are complete you are okay to eat.   Pre procedure instructions for CT scan on 03/26/21. Please follow these instructions carefully (unless otherwise directed):  Hold all erectile dysfunction medications at least 72 hours prior to test. (Cialis)  On the Day/Night Before the Test: Drink plenty of water. Do not consume any caffeinated/decaffeinated beverages or chocolate 12 hours prior to your test. Do not take any antihistamines 12 hours prior to your test.  On the Day of the Test: Drink plenty of water. Do not drink any water within one hour of the test. Do not eat any food 4 hours prior to the test, nothing after 6:30 AM.  You may take your regular medications prior to the test with the following exception. Do not take Furosemide, Glipizide and Metformin the morning of test.  Please take Metoprolol Tartrate 100mg  at 8:30 AM the morning of test with a few sips of water. A prescription will be sent to your pharmacy.   After the Test: Drink plenty of water. After receiving IV contrast, you may experience a mild flushed feeling. This is normal. On occasion, you may experience a mild rash up to 24 hours after the test. This is not dangerous. If this occurs, you can take Benadryl 25 mg and increase your fluid intake. If you experience trouble breathing, this can be serious. If it is severe call 911 IMMEDIATELY. If it is mild, please call our office. Do NOT take Metformin for 48 hours (2 days) after completing test.  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  [place], [address], [place], [place] [address], [phone] ([place]) You are scheduled for surgical evaluation with Dr Cyndia Bent on Wednesday, April 10, 2021 at 9:30 AM.  Please arrive at 9:15 AM for check-in.    Medication Instructions:  No changes  If you need a refill on your cardiac medications before your next appointment, please call your pharmacy.   Lab work: No new labs needed  Testing/Procedures: No new testing needed  Follow-Up: At [facility], you and your health needs are our priority.  As part of our continuing mission to provide you with exceptional heart care, we have created designated Provider Care Teams.  These Care Teams include your primary Cardiologist (physician) and Advanced Practice Providers (APPs -  Physician Assistants and Nurse Practitioners) who all work together to provide you with the care you need, when you need it.  You will need a follow up appointment in 3 months  Providers on your designated Care Team:   Murray Hodgkins, NP Christell Faith, PA-C Cadence Kathlen Mody, [place]  COVID-19 Vaccine Information can be found at: [email or web address] For questions related to vaccine distribution or appointments, please email [email or web address] or call [phone].

## 2021-03-22 NOTE — Progress Notes (Signed)
Cardiology Office Note  Date:  03/22/2021   ID:  WINTHROP SHANNAHAN, DOB [birth year], MRN [id number]  PCP:  Jordan Sizer, MD   Chief Complaint  Patient presents with   Follow up cardiac cath     "Doing well." Medications reviewed by the patient verbally.     HPI:  [name] is a 81 year old gentleman with past medical history of Diabetes Smoked, age 56, 60 years Morbid obesity ARB/ACE causing angioedema Essential hypertension Hyperlipidemia Severe aortic valve stenosis,  Who presents for f/u of his aortic valve stenosis  In follow-up today reports he is going through the protocol in preparation for possible TAVR  Scheduled for cardiac CTA next week  Presents in a wheelchair Denies chest pain, no significant leg edema Continued shortness of breath on exertion, some chest tightness Cardiac catheterization February 15, 2021 results discussed with him in detail  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  Otherwise, there is mild-moderate, non-obstructive coronary artery disease, as detailed below. Mildly elevated left heart and pulmonary artery pressures (PCWP 22 mmHg, mean PAP 26 mmHg). Severely elevated right heart filling pressure (mean RAP 16 mmHg, RVEDP 16 mmHg). Normal Fick cardiac output/index (CO 6.1 L/min, CI 2.5 L/min/m^2).   Echo 03/2020, prior study reviewed with him The aortic valve has an indeterminant number of cusps. There is  moderate calcification of the aortic valve. There is severe thickening of  the aortic valve. Aortic valve regurgitation is not visualized. Severe  aortic valve stenosis. Aortic valve mean  gradient measures 47.0 mmHg.    Lab work reviewed with him in detail Total chol 124 LDL 60 HBA1C 7.7  EKG personally reviewed by myself on todays visit Shows normal sinus rhythm right bundle branch block rate 89 bpm  PMH:   has a past medical history of Aortic stenosis, Chronic  kidney disease, stage II (mild), Decreased libido, Diabetes mellitus without complication (Jordan [name]), Glaucoma, Gout (2009), Hernia (0254,2706), Hyperlipidemia, Hypertension, Inguinal hernia without mention of obstruction or gangrene, unilateral or unspecified, (not specified as recurrent), Lumbago, Obesity, and Unspecified constipation.  PSH:    Past Surgical History:  Procedure Laterality Date   cataract surgery     COLONOSCOPY  2012   COLONOSCOPY N/A 04/22/2019   Procedure: COLONOSCOPY;  Surgeon: Mansouraty, Telford Nab., MD;  Location: [facility];  Service: Gastroenterology;  Laterality: N/A;   EYE SURGERY  2011   cataract   RIGHT HEART CATH AND CORONARY ANGIOGRAPHY Bilateral 02/15/2021   Procedure: RIGHT HEART CATH AND CORONARY ANGIOGRAPHY;  Surgeon: Nelva Bush, MD;  Location: [facility];  Service: Cardiovascular;  Laterality: Bilateral;   UMBILICAL HERNIA REPAIR  2011    Current Outpatient Medications  Medication Sig Dispense Refill   ACCU-CHEK AVIVA PLUS test strip TEST TWO TIMES DAILY AS NEEDED AS DIRECTED 200 strip 2   Accu-Chek FastClix Lancets MISC 1 each by Subdermal route 2 (two) times daily. 102 each 2   allopurinol (ZYLOPRIM) 300 MG tablet TAKE 1 TABLET EVERY DAY 90 tablet 1   amLODipine (NORVASC) 10 MG tablet TAKE 1 TABLET EVERY DAY 90 tablet 1   aspirin EC 81 MG tablet Take 1 tablet (81 mg total) by mouth daily. Swallow whole. (Patient taking differently: Take 81 mg by mouth at bedtime. Swallow whole.)     atorvastatin (LIPITOR) 40 MG tablet TAKE 1 TABLET EVERY DAY (Patient taking differently: Take 40 mg by mouth at bedtime.) 90 tablet 1   Blood Glucose Monitoring  Suppl (ACCU-CHEK AVIVA PLUS) w/Device KIT 1 each by Does not apply route 2 (two) times daily. Use as directed 100 kit 12   Cholecalciferol (VITAMIN D3 PO) Take 1 tablet by mouth every Monday, Wednesday, and Friday.     furosemide (LASIX) 20 MG tablet Take 1 tablet (20 mg total) by mouth daily. 30 tablet 5    glipiZIDE (GLUCOTROL XL) 5 MG 24 hr tablet TAKE 1 TABLET (5 MG TOTAL) BY MOUTH DAILY WITH BREAKFAST. 90 tablet 1   latanoprost (XALATAN) 0.005 % ophthalmic solution Place 1 drop into the left eye at bedtime.      metFORMIN (GLUCOPHAGE-XR) 750 MG 24 hr tablet Take 2 tablets (1,500 mg total) by mouth daily with breakfast. 180 tablet 1   metoprolol tartrate (LOPRESSOR) 100 MG tablet Take as directed prior to 1/10 CT Scan 1 tablet 0   Omega-3 Fatty Acids (FISH OIL PO) Take 1 capsule by mouth 2 (two) times daily.      polyethylene glycol (MIRALAX / GLYCOLAX) packet Take 17 g by mouth at bedtime.      tadalafil (CIALIS) 10 MG tablet Take 1-2 tablets (10-20 mg total) by mouth every three (3) days as needed for erectile dysfunction. 30 tablet 0   tamsulosin (FLOMAX) 0.4 MG CAPS capsule TAKE 1 CAPSULE EVERY DAY 90 capsule 1   No current facility-administered medications for this visit.     Allergies:   Ace inhibitors   Social History:  The patient  reports that he quit smoking about 45 years ago. His smoking use included cigarettes. He started smoking about 56 years ago. He has a 10.00 pack-year smoking history. He has never used smokeless tobacco. He reports current alcohol use of about 1.0 standard drink per week. He reports that he does not use drugs.   Family History:   family history includes Diabetes in his brother, sister, and sister; Heart disease in his father; Hypertension in his father; Prostate cancer in his brother.    Review of Systems: Review of Systems  Constitutional: Negative.   HENT: Negative.    Respiratory:  Positive for shortness of breath.   Cardiovascular: Negative.   Gastrointestinal: Negative.   Musculoskeletal: Negative.        Leg weakness  Neurological: Negative.   Psychiatric/Behavioral: Negative.    All other systems reviewed and are negative.  PHYSICAL EXAM: VS:  BP 110/72 (BP Location: Left Wrist, Patient Position: Sitting, Cuff Size: Normal)    Pulse 89     Ht _0  (1.753 m)    Wt 296 lb (134.3 kg)    SpO2 98%    BMI 43.71 kg/m  , BMI Body mass index is 43.71 kg/m. Constitutional:  oriented to person, place, and time. No distress.  In wheelchair, carries a cane HENT:  Head: Grossly normal Eyes:  no discharge. No scleral icterus.  Neck: No JVD, no carotid bruits  Cardiovascular: Regular rate and rhythm, 3/6 systolic ejection murmur right sternal border Pulmonary/Chest: Clear to auscultation bilaterally, no wheezes or rails Abdominal: Soft.  no distension.  no tenderness.  Musculoskeletal: Normal range of motion Neurological:  normal muscle tone. Coordination normal. No atrophy Skin: Skin warm and dry Psychiatric: normal affect, Jordan   Recent Labs: 01/16/2021: ALT 11 02/12/2021: BUN 15; Creatinine, Ser 0.99; Hemoglobin 10.7; Platelets 247; Potassium 4.5; Sodium 134    Lipid Panel Lab Results  Component Value Date   CHOL 122 01/16/2021   HDL 49 01/16/2021   LDLCALC 58 01/16/2021   TRIG  73 01/16/2021      Wt Readings from Last 3 Encounters:  03/22/21 296 lb (134.3 kg)  03/19/21 299 lb (135.6 kg)  03/15/21 295 lb (133.8 kg)     ASSESSMENT AND PLAN:  Aortic valve stenosis, severe Moderate aortic valve stenosis June 2020 Severe stenosis January 2022 Recent cardiac catheterization, nonobstructive disease Cardiac CTA scheduled next week, has seen Dr. Burt Knack Has follow-up with Dr. Cyndia Bent We have gone through the protocol/instructions for cardiac CTA next week, he has instructions, metoprolol to be taken morning of CT  Essential hypertension - Plan: EKG 12-Lead Blood pressure is well controlled on today's visit. No changes made to the medications.  Mixed hyperlipidemia Cholesterol is at goal on the current lipid regimen. No changes to the medications were made.  Controlled type 2 diabetes mellitus with microalbuminuria, without long-term current use of insulin (HCC) A1c typically 6-7, Sedentary Will help after TAVR we  can encourage weight loss, exercise program  Obesity, Class III, BMI 40-49.9 (morbid obesity) (HCC) Walking as tolerated for now, consider rehab after valve procedure if needed (cardiac rehab)   Total encounter time more than 25 minutes  Greater than 50% was spent in counseling and coordination of care with the patient    No orders of the defined types were placed in this encounter.    Signed, Esmond Plants, M.D., Ph.D. 03/22/2021  [place], [place]

## 2021-03-25 ENCOUNTER — Ambulatory Visit: Payer: Medicare HMO | Admitting: Cardiovascular Disease

## 2021-03-26 ENCOUNTER — Ambulatory Visit (HOSPITAL_COMMUNITY): Payer: Medicare HMO

## 2021-03-26 ENCOUNTER — Other Ambulatory Visit: Payer: Self-pay

## 2021-03-26 MED ORDER — METOPROLOL TARTRATE 100 MG PO TABS: ORAL_TABLET | ORAL | 0 refills | Status: DC

## 2021-03-27 LAB — BASIC METABOLIC PANEL
BUN/Creatinine Ratio: 20 (ref 10–24)
BUN: 20 mg/dL (ref 8–27)
CO2: 18 mmol/L — ABNORMAL LOW (ref 20–29)
Calcium: 9.9 mg/dL (ref 8.6–10.2)
Chloride: 100 mmol/L (ref 96–106)
Creatinine, Ser: 1.02 mg/dL (ref 0.76–1.27)
Glucose: 153 mg/dL — ABNORMAL HIGH (ref 70–99)
Potassium: 4.5 mmol/L (ref 3.5–5.2)
Sodium: 138 mmol/L (ref 134–144)
eGFR: 75 mL/min/{1.73_m2} (ref >59)

## 2021-04-08 ENCOUNTER — Other Ambulatory Visit: Payer: Self-pay

## 2021-04-08 ENCOUNTER — Encounter (HOSPITAL_COMMUNITY): Payer: Self-pay

## 2021-04-08 ENCOUNTER — Ambulatory Visit (HOSPITAL_COMMUNITY)
Admission: RE | Admit: 2021-04-08 | Discharge: 2021-04-08 | Disposition: A | Discharge status: Home / Self Care | Payer: Medicare HMO | Source: Ambulatory Visit | Attending: Cardiovascular Disease | Admitting: Cardiovascular Disease

## 2021-04-08 DIAGNOSIS — I350 Nonrheumatic aortic (valve) stenosis: Secondary | ICD-10-CM | POA: Diagnosis not present

## 2021-04-08 DIAGNOSIS — K5730 Diverticulosis of large intestine without perforation or abscess without bleeding: Secondary | ICD-10-CM | POA: Diagnosis not present

## 2021-04-08 DIAGNOSIS — I700 Atherosclerosis of aorta: Secondary | ICD-10-CM | POA: Diagnosis not present

## 2021-04-08 DIAGNOSIS — I2510 Atherosclerotic heart disease of native coronary artery without angina pectoris: Secondary | ICD-10-CM | POA: Diagnosis not present

## 2021-04-08 MED ORDER — IOHEXOL 350 MG/ML SOLN
100.0000 mL | Freq: Once | INTRAVENOUS | Status: AC | PRN
  Administered 2021-04-08: 100 mL via INTRAVENOUS

## 2021-04-10 ENCOUNTER — Encounter: Payer: Self-pay | Admitting: Surgery

## 2021-04-10 ENCOUNTER — Other Ambulatory Visit: Payer: Self-pay

## 2021-04-10 ENCOUNTER — Institutional Professional Consult (permissible substitution): Payer: Medicare HMO | Admitting: Surgery

## 2021-04-10 VITALS — BP 146/83 | HR 87 | Resp 20 | Ht 69.0 in | Wt 296.0 lb

## 2021-04-10 DIAGNOSIS — I350 Nonrheumatic aortic (valve) stenosis: Secondary | ICD-10-CM

## 2021-04-10 DIAGNOSIS — R9389 Abnormal findings on diagnostic imaging of other specified body structures: Secondary | ICD-10-CM

## 2021-04-10 NOTE — Progress Notes (Signed)
Patient ID: Jordan [name], male   DOB: Sep 15, 1940, 81 y.o.   MRN: [id number]  [facility]IDISCIPLIN[facility] VALVE CLINIC        [place]       , [id number]             [phone]          CARDIOTHORACIC SURGERY CONSULTATION REPORT  PCP is Steele Sizer, MD Referring Provider is Sherren Mocha, MD Primary Cardiologist is Esmond Plants, MD  Reason for consultation:  Severe aortic stenosis  HPI:  The patient is a 81 year old gentleman with history of hypertension, hyperlipidemia, type 2 diabetes, gout, stage II chronic kidney disease, morbid obesity and aortic stenosis that has been followed with echocardiogram.  A 2D echocardiogram in January 2022 showed an indeterminate number of cusps.  There was moderate calcification and severe thickening with restricted mobility.  The mean gradient was 47 mmHg with a peak gradient of 63 mmHg consistent with severe stenosis.  Left ventricular ejection fraction was 50 to 55% with mild LVH and grade 1 diastolic dysfunction.  He reports that over the past year he has developed progressive exertional shortness of breath and fatigue.  He has had no chest pain or pressure.  He denies any dizziness or syncope.  He has had no orthopnea or PND.  Denies peripheral edema.  Cardiac catheterization on 02/15/2021 showed severe single-vessel coronary disease with chronic total occlusion of a small caliber second marginal branch.  Otherwise there was mild to moderate nonobstructive coronary disease.  There were mildly elevated left heart and pulmonary pressures with a wedge of 22 and a mean PA pressure of 26 mmHg.  His most recent echocardiogram on 03/15/2021 showed a mean gradient across aortic valve of 35 mmHg with a peak of 54.5 mmHg.  The valve appeared functionally bicuspid with moderate calcification and thickening and restricted mobility.  Left ventricular ejection fraction was 50 to 55%.  The patient is widowed  and here today with his sister.  He ambulates with a cane due to leg weakness and knee problems.    Past Medical History:  Diagnosis Date   Aortic stenosis    Chronic kidney disease, stage II (mild)    Decreased libido    Diabetes mellitus without complication ([place])    Glaucoma    Gout 2009   Hernia [id number]   Hyperlipidemia    Hypertension    Inguinal hernia without mention of obstruction or gangrene, unilateral or unspecified, (not specified as recurrent)    Lumbago    Obesity    Unspecified constipation     Past Surgical History:  Procedure Laterality Date   cataract surgery     COLONOSCOPY  2012   COLONOSCOPY N/A 04/22/2019   Procedure: COLONOSCOPY;  Surgeon: Mansouraty, Telford Nab., MD;  Location: [facility];  Service: Gastroenterology;  Laterality: N/A;   EYE SURGERY  2011   cataract   RIGHT HEART CATH AND CORONARY ANGIOGRAPHY Bilateral 02/15/2021   Procedure: RIGHT HEART CATH AND CORONARY ANGIOGRAPHY;  Surgeon: Nelva Bush, MD;  Location: [facility];  Service: Cardiovascular;  Laterality: Bilateral;   UMBILICAL HERNIA REPAIR  2011    Family History  Problem Relation Age of Onset   Heart disease Father    Hypertension Father    Diabetes Sister    Diabetes Brother    Prostate cancer Brother    Diabetes Sister    Kidney disease Neg Hx  Kidney cancer Neg Hx    Bladder Cancer Neg Hx    Colon cancer Neg Hx    Esophageal cancer Neg Hx    Inflammatory bowel disease Neg Hx    Liver disease Neg Hx    Pancreatic cancer Neg Hx    Rectal cancer Neg Hx    Stomach cancer Neg Hx     Social History   Socioeconomic History   Marital status: Widowed    Spouse name: Not on file   Number of children: 2   Years of education: Not on file   Highest education level: Some college, no degree  Occupational History   Occupation: retired     Comment: used to [employer]   Tobacco Use   Smoking status: Former    Packs/day: 1.00    Years: 10.00     Pack years: 10.00    Types: Cigarettes    Start date: 03/17/1965    Quit date: 11/27/1975    Years since quitting: 45.4   Smokeless tobacco: Never   Tobacco comments:    quit 40 years  Vaping Use   Vaping Use: Never used  Substance and Sexual Activity   Alcohol use: Yes    Alcohol/week: 1.0 standard drink    Types: 1 Standard drinks or equivalent per week    Comment: socially - 1 x year   Drug use: No   Sexual activity: Yes    Partners: Female  Other Topics Concern   Not on file  Social History Narrative   Lives by himself in [place]   Two grown children, daughter in [place] and son in [place]   He goes to [place] to visit his sisters often    Social Determinants of Radio broadcast assistant Strain: Low Risk    Difficulty of Paying Living Expenses: Not hard at all  Food Insecurity: No Food Insecurity   Worried About Charity fundraiser in the Last Year: Never true   Arboriculturist in the Last Year: Never true  Transportation Needs: No Transportation Needs   Lack of Transportation (Medical): No   Lack of Transportation (Non-Medical): No  Physical Activity: Sufficiently Active   Days of Exercise per Week: 5 days   Minutes of Exercise per Session: 30 min  Stress: No Stress Concern Present   Feeling of Stress : Not at all  Social Connections: Moderately Integrated   Frequency of Communication with Friends and Family: More than three times a week   Frequency of Social Gatherings with Friends and Family: More than three times a week   Attends Religious Services: More than 4 times per year   Active Member of [employer] or Organizations: Yes   Attends Archivist Meetings: More than 4 times per year   Marital Status: Widowed  Human resources officer Violence: Not At Risk   Fear of Current or Ex-Partner: No   Emotionally Abused: No   Physically Abused: No   Sexually Abused: No    Prior to Admission medications   Medication Sig Start Date End Date Taking? Authorizing  Provider  ACCU-CHEK AVIVA PLUS test strip TEST TWO TIMES DAILY AS NEEDED AS DIRECTED 09/10/20  Yes Sowles, Drue Stager, MD  Accu-Chek FastClix Lancets MISC 1 each by Subdermal route 2 (two) times daily. 11/13/20  Yes Sowles, Drue Stager, MD  allopurinol (ZYLOPRIM) 300 MG tablet TAKE 1 TABLET EVERY DAY 11/13/20  Yes Sowles, Drue Stager, MD  amLODipine (NORVASC) 10 MG tablet TAKE 1 TABLET EVERY DAY 11/13/20  Yes Steele Sizer, MD  aspirin EC 81 MG tablet Take 1 tablet (81 mg total) by mouth daily. Swallow whole. Patient taking differently: Take 81 mg by mouth at bedtime. Swallow whole. 09/23/19  Yes Loel Dubonnet, NP  atorvastatin (LIPITOR) 40 MG tablet TAKE 1 TABLET EVERY DAY Patient taking differently: Take 40 mg by mouth at bedtime. 11/13/20  Yes Sowles, Drue Stager, MD  Blood Glucose Monitoring Suppl (ACCU-CHEK AVIVA PLUS) w/Device KIT 1 each by Does not apply route 2 (two) times daily. Use as directed 04/01/19  Yes Sowles, Drue Stager, MD  Cholecalciferol (VITAMIN D3 PO) Take 1 tablet by mouth every Monday, Wednesday, and Friday.   Yes [provider]  furosemide (LASIX) 20 MG tablet Take 1 tablet (20 mg total) by mouth daily. 02/15/21 02/15/22 Yes End, Harrell Gave, MD  glipiZIDE (GLUCOTROL XL) 5 MG 24 hr tablet TAKE 1 TABLET (5 MG TOTAL) BY MOUTH DAILY WITH BREAKFAST. 11/13/20  Yes Sowles, Drue Stager, MD  latanoprost (XALATAN) 0.005 % ophthalmic solution Place 1 drop into the left eye at bedtime.  06/18/15  Yes [provider]  metFORMIN (GLUCOPHAGE-XR) 750 MG 24 hr tablet Take 2 tablets (1,500 mg total) by mouth daily with breakfast. 02/18/21  Yes End, Harrell Gave, MD  metoprolol tartrate (LOPRESSOR) 100 MG tablet Take as directed prior to 1/23 CT Scan 03/26/21  Yes Sherren Mocha, MD  Omega-3 Fatty Acids (FISH OIL PO) Take 1 capsule by mouth 2 (two) times daily.    Yes [provider]  polyethylene glycol (MIRALAX / GLYCOLAX) packet Take 17 g by mouth at bedtime.    Yes [provider]   tadalafil (CIALIS) 10 MG tablet Take 1-2 tablets (10-20 mg total) by mouth every three (3) days as needed for erectile dysfunction. 04/24/20  Yes Sowles, Drue Stager, MD  tamsulosin (FLOMAX) 0.4 MG CAPS capsule TAKE 1 CAPSULE EVERY DAY 11/13/20  Yes Steele Sizer, MD    Current Outpatient Medications  Medication Sig Dispense Refill   ACCU-CHEK AVIVA PLUS test strip TEST TWO TIMES DAILY AS NEEDED AS DIRECTED 200 strip 2   Accu-Chek FastClix Lancets MISC 1 each by Subdermal route 2 (two) times daily. 102 each 2   allopurinol (ZYLOPRIM) 300 MG tablet TAKE 1 TABLET EVERY DAY 90 tablet 1   amLODipine (NORVASC) 10 MG tablet TAKE 1 TABLET EVERY DAY 90 tablet 1   aspirin EC 81 MG tablet Take 1 tablet (81 mg total) by mouth daily. Swallow whole. (Patient taking differently: Take 81 mg by mouth at bedtime. Swallow whole.)     atorvastatin (LIPITOR) 40 MG tablet TAKE 1 TABLET EVERY DAY (Patient taking differently: Take 40 mg by mouth at bedtime.) 90 tablet 1   Blood Glucose Monitoring Suppl (ACCU-CHEK AVIVA PLUS) w/Device KIT 1 each by Does not apply route 2 (two) times daily. Use as directed 100 kit 12   Cholecalciferol (VITAMIN D3 PO) Take 1 tablet by mouth every Monday, Wednesday, and Friday.     furosemide (LASIX) 20 MG tablet Take 1 tablet (20 mg total) by mouth daily. 30 tablet 5   glipiZIDE (GLUCOTROL XL) 5 MG 24 hr tablet TAKE 1 TABLET (5 MG TOTAL) BY MOUTH DAILY WITH BREAKFAST. 90 tablet 1   latanoprost (XALATAN) 0.005 % ophthalmic solution Place 1 drop into the left eye at bedtime.      metFORMIN (GLUCOPHAGE-XR) 750 MG 24 hr tablet Take 2 tablets (1,500 mg total) by mouth daily with breakfast. 180 tablet 1   metoprolol tartrate (LOPRESSOR) 100 MG tablet  Take as directed prior to 1/23 CT Scan 1 tablet 0   Omega-3 Fatty Acids (FISH OIL PO) Take 1 capsule by mouth 2 (two) times daily.      polyethylene glycol (MIRALAX / GLYCOLAX) packet Take 17 g by mouth at bedtime.      tadalafil (CIALIS) 10 MG  tablet Take 1-2 tablets (10-20 mg total) by mouth every three (3) days as needed for erectile dysfunction. 30 tablet 0   tamsulosin (FLOMAX) 0.4 MG CAPS capsule TAKE 1 CAPSULE EVERY DAY 90 capsule 1   No current facility-administered medications for this visit.    Allergies  Allergen Reactions   Ace Inhibitors Other (See Comments)    angioedema      Review of Systems:   General:  normal appetite, + decreased energy, no weight gain, no weight loss, no fever  Cardiac:  no chest pain with exertion, no chest pain at rest, + SOB with mild exertion, no resting SOB, no PND, no orthopnea, no palpitations, no arrhythmia, no atrial fibrillation, no LE edema, no dizzy spells, no syncope  Respiratory:  + exertional shortness of breath, no home oxygen, no productive cough, no dry cough, no bronchitis, no wheezing, no hemoptysis, no asthma, no pain with inspiration or cough, no sleep apnea, no CPAP at night  GI:   no difficulty swallowing, no reflux, no frequent heartburn, no hiatal hernia, no abdominal pain, no constipation, no diarrhea, no hematochezia, no hematemesis, no melena  GU:   no dysuria,  + frequency, no urinary tract infection, no hematuria, no enlarged prostate, no kidney stones, + kidney disease  Vascular:  no pain suggestive of claudication, no pain in feet, no leg cramps, no varicose veins, no DVT, no non-healing foot ulcer  Neuro:   no stroke, no TIA's, no seizures, no headaches, no temporary blindness one eye,  no slurred speech, no peripheral neuropathy, no chronic pain, + instability of gait, no memory/cognitive dysfunction  Musculoskeletal: +  arthritis - primarily involving the knees, no joint swelling, no myalgias, + difficulty walking, + decreased mobility   Skin:   no rash, no itching, no skin infections, no pressure sores or ulcerations  Psych:   no anxiety, no depression, no nervousness, no unusual recent stress  Eyes:   no blurry vision, no floaters, no recent vision changes,  + wears glasses  ENT:   no hearing loss, no loose or painful teeth, no dentures, last saw dentist last year  Hematologic:  no easy bruising, no abnormal bleeding, no clotting disorder, no frequent epistaxis  Endocrine:  + diabetes, does  check CBG's at home     Physical Exam:   BP (!) 146/83 (BP Location: Right Arm, Patient Position: Sitting)    Pulse 87    Resp 20    Ht '5\' 9"'  (1.753 m)    Wt 296 lb (134.3 kg)    SpO2 98% Comment: RA   BMI 43.71 kg/m   General:  Elderly, well-appearing  HEENT:  Unremarkable, NCAT, PERLA, EOMI  Neck:   no JVD, no bruits, no adenopathy   Chest:   clear to auscultation, symmetrical breath sounds, no wheezes, no rhonchi   CV:   RRR, 3/6 systolic murmur RSB, no diastolic murmur  Abdomen:  soft, non-tender, no masses   Extremities:  warm, well-perfused, pulses palpable in ankles, no lower extremity edema  Rectal/GU  Deferred  Neuro:   Grossly non-focal and symmetrical throughout  Skin:   Clean and dry, no rashes, no breakdown  Diagnostic Tests:  ECHOCARDIOGRAM REPORT         Patient Name:   Jordan [name] Date of Exam: 03/15/2021  Medical Rec #:  [id number]         Height:       69.0 in  Accession #:    [id number]        Weight:       297.8 lb  Date of Birth:  [birth year]         BSA:          2.447 m  Patient Age:    63 years          BP:           118/74 mmHg  Patient Gender: M                 HR:           84 bpm.  Exam Location:  [facility]   Procedure: 2D Echo, Cardiac Doppler and Color Doppler   Indications:    I35.9 Aortic stenosis     History:        Patient has prior history of Echocardiogram examinations.  PAD,                  Aortic Valve Disease; Risk Factors:Diabetes, Dyslipidemia  and                  Morbid obesity. Previous echo revealed LVEF 55% sevre AS  with                  mean of 47.     Sonographer:    Lenard Galloway BA, RDCS  Referring Phys: [place]     1. Left ventricular ejection  fraction, by estimation, is 50 to 55%. Left  ventricular ejection fraction by PLAX is 63 %. The left ventricle has low  normal function. The left ventricle has no regional wall motion  abnormalities. Left ventricular diastolic  parameters are consistent with Grade I diastolic dysfunction (impaired  relaxation). Elevated left ventricular end-diastolic pressure.   2. Right ventricular systolic function is normal. The right ventricular  size is normal.   3. The mitral valve is normal in structure. No evidence of mitral valve  regurgitation. No evidence of mitral stenosis.   4. 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 The aortic valve is  calcified. There is moderate calcification of the aortic valve. There is  moderate thickening of the aortic valve. Aortic valve regurgitation is not  visualized. Moderate aortic valve  stenosis. Aortic valve area, by VTI measures 1.37 cm. Aortic valve mean  gradient measures 35.0 mmHg. Aortic valve Vmax measures 3.69 m/s.   5. Aortic dilatation noted. There is mild dilatation of the aortic root,  measuring 39 mm. There is mild dilatation of the ascending aorta,  measuring 41 mm.   6. The inferior vena cava is normal in size with greater than 50%  respiratory variability, suggesting right atrial pressure of 3 mmHg.   FINDINGS   Left Ventricle: Left ventricular ejection fraction, by estimation, is 50  to 55%. Left ventricular ejection fraction by PLAX is 63 %. The left  ventricle has low normal function. The left ventricle has no regional wall  motion abnormalities. The left  ventricular internal cavity size was normal in size. There is no left  ventricular hypertrophy. Left ventricular diastolic parameters are  consistent with Grade I diastolic dysfunction (impaired relaxation).  Elevated left ventricular end-diastolic  pressure.   Right Ventricle: The right ventricular size is normal. No increase in  right ventricular wall thickness. Right  ventricular systolic function is  normal.   Left Atrium: Left atrial size was normal in size.   Right Atrium: Right atrial size was normal in size.   Pericardium: There is no evidence of pericardial effusion.   Mitral Valve: The mitral valve is normal in structure. No evidence of  mitral valve regurgitation. No evidence of mitral valve stenosis.   Tricuspid Valve: The tricuspid valve is normal in structure. Tricuspid  valve regurgitation is trivial. No evidence of tricuspid stenosis.   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 The aortic valve  is calcified. There is moderate calcification of the aortic valve. There  is moderate thickening of the aortic valve. Aortic valve regurgitation is  not visualized. Moderate aortic  stenosis is present. Aortic valve mean gradient measures 35.0 mmHg. Aortic  valve peak gradient measures 54.5 mmHg. Aortic valve area, by VTI measures  1.37 cm.   Pulmonic Valve: The pulmonic valve was normal in structure. Pulmonic valve  regurgitation is not visualized. No evidence of pulmonic stenosis.   Aorta: Aortic dilatation noted. There is mild dilatation of the aortic  root, measuring 39 mm. There is mild dilatation of the ascending aorta,  measuring 41 mm.   Venous: The inferior vena cava is normal in size with greater than 50%  respiratory variability, suggesting right atrial pressure of 3 mmHg.   IAS/Shunts: No atrial level shunt detected by color flow Doppler.      LEFT VENTRICLE  PLAX 2D  LV EF:         Left            Diastology                 ventricular     LV e' medial:    3.81 cm/s                 ejection        LV E/e' medial:  16.4                 fraction by     LV e' lateral:   6.42 cm/s                 PLAX is 63      LV E/e' lateral: 9.8                 %.  LVIDd:         4.40 cm  LVIDs:         2.90 cm  LV PW:         1.40 cm  LV IVS:        1.40 cm  LVOT diam:     2.70 cm  LV SV:         104  LV SV Index:    43  LVOT Area:     5.73 cm      RIGHT VENTRICLE  RV Basal diam:  3.40 cm  RV Mid diam:    4.00 cm  RV S prime:     13.50 cm/s  TAPSE (M-mode): 2.1 cm   LEFT ATRIUM             Index        RIGHT ATRIUM           Index  LA  diam:        4.70 cm 1.92 cm/m   RA Area:     15.10 cm  LA Vol (A2C):   68.0 ml 27.79 ml/m  RA Volume:   33.00 ml  13.49 ml/m  LA Vol (A4C):   33.9 ml 13.85 ml/m  LA Biplane Vol: 51.3 ml 20.97 ml/m   AORTIC VALVE  AV Area (Vmax):    1.44 cm  AV Area (Vmean):   1.39 cm  AV Area (VTI):     1.37 cm  AV Vmax:           369.00 cm/s  AV Vmean:          273.000 cm/s  AV VTI:            0.762 m  AV Peak Grad:      54.5 mmHg  AV Mean Grad:      35.0 mmHg  LVOT Vmax:         92.50 cm/s  LVOT Vmean:        66.400 cm/s  LVOT VTI:          0.182 m  LVOT/AV VTI ratio: 0.24     AORTA  Ao Root diam: 3.90 cm  Ao Asc diam:  4.10 cm   MITRAL VALVE                TRICUSPID VALVE  MV Area (PHT): 3.93 cm     TR Peak grad:   18.7 mmHg  MV Decel Time: 193 msec     TR Vmax:        216.00 cm/s  MV E velocity: 62.60 cm/s  MV A velocity: 107.00 cm/s  SHUNTS  MV E/A ratio:  0.59         Systemic VTI:  0.18 m                              Systemic Diam: 2.70 cm   Skeet Latch MD  Electronically signed by Skeet Latch MD  Signature Date/Time: 03/15/2021/6:50:02 PM         Final     Physicians Panel Physicians Referring Physician Case Authorizing Physician  End, Harrell Gave, MD (Primary)    Procedures RIGHT HEART CATH AND CORONARY ANGIOGRAPHY  Conclusion Conclusions:  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 Otherwise, there is mild-moderate, non-obstructive coronary artery disease, as detailed below. Mildly elevated left heart and pulmonary artery pressures (PCWP 22 mmHg, mean PAP 26 mmHg). Severely elevated right heart filling pressure (mean RAP 16 mmHg, RVEDP 16 mmHg). Normal Fick  cardiac output/index (CO 6.1 L/min, CI 2.5 L/min/m^2). Recommendations:  Medical therapy of coronary artery disease, including aggressive secondary prevention. Consider gentle diuresis; will start furosemide 20 mg PO daily at discharge. Ongoing workup of severe aortic stenosis per Dr. Rockey Situ. Consider referral to structural heart clinic for consideration of TAVR. Nelva Bush, MD  [facility]  Recommendations Antiplatelet/Anticoag Recommend Aspirin 62m daily for moderate CAD.   Discharge Date In the absence of any other complications or medical issues, we expect the patient to be ready for discharge from a cath perspective on 02/15/2021.  Indications Severe aortic valve stenosis [I35.0 (ICD-10-CM)]  Procedural Details Technical Details Indication: 81y.o. year-old man with history of severe aortic stenosis (mean gradient 47 mmHg by echo), hypertension, hyperlipidemia, type 2 diabetes mellitus, and chronic kidney disease stage II, presenting for evaluation of  exertional dyspnea in the setting of severe aortic stenosis.  Heart Failure (start of procedure): NYHA class II  GFR: >60 ml/min  Procedure: The risks, benefits, complications, treatment options, and expected outcomes were discussed with the patient. The patient and/or family concurred with the proposed plan, giving informed consent. The right wrist and elbow were prepped and draped in a sterile fashion. 1% lidocaine was used for local anesthesia. A previously placed antecubital vein IV was exchanged for a 61F slender Glidesheath using modified Seldinger technique. Right heart catheterization was performed by advancing a 61F balloon-tipped catheter through the right heart chambers into the pulmonary capillary wedge position. Pressure measurements and oxygen saturations were obtained.  Using the modified Seldinger access technique, a 47F slender Glidesheath was placed in the right radial artery. 3 mg verapamil was given through the  sheath. Heparin 5,000 units were administered. Selective coronary angiography was performed using a 61F Jacky catheter to engage the left coronary artery and a 61F Jacky catheter to engage the right coronary artery. Left heart catheterization was not performed.  At the end of the procedure, the radial artery sheath was removed and a TR band applied to achieve patent hemostasis. The antecubital vein sheath was removed and hemostasis achieved with manual compression. There were no immediate complications. The patient was taken to the recovery area in stable condition.   Estimated blood loss <50 mL.   During this procedure medications were administered to achieve and maintain moderate conscious sedation while the patient's heart rate, blood pressure, and oxygen saturation were continuously monitored and I was present face-to-face 100% of this time.  Medications (Filter: Administrations occurring from 1431 to 1514 on 02/15/21)  fentaNYL (SUBLIMAZE) injection (mcg) Total dose: 12.5 mcg  Date/Time Rate/Dose/Volume Action    02/15/21 1437 12.5 mcg Given   midazolam (VERSED) injection (mg) Total dose: 0.5 mg  Date/Time Rate/Dose/Volume Action    02/15/21 1437 0.5 mg Given   lidocaine (PF) (XYLOCAINE) 1 % injection (mL) Total volume: 4 mL  Date/Time Rate/Dose/Volume Action    02/15/21 1442 2 mL Given   1450 2 mL Given   verapamil (ISOPTIN) injection (mg) Total dose: 2.5 mg  Date/Time Rate/Dose/Volume Action    02/15/21 1451 2.5 mg Given   heparin sodium (porcine) injection (Units) Total dose: 5,000 Units  Date/Time Rate/Dose/Volume Action    02/15/21 1453 5,000 Units Given   iohexol (OMNIPAQUE) 350 MG/ML injection (mL) Total volume: 30 mL  Date/Time Rate/Dose/Volume Action    02/15/21 1507 30 mL Given   Heparin (Porcine) in NaCl 1000-0.9 UT/500ML-% SOLN (mL) Total volume: 1,000 mL  Date/Time Rate/Dose/Volume Action    02/15/21 1508 500 mL Given   1508 500 mL Given   Sedation  Time Sedation Time Physician-1: 23 minutes 5 seconds  Contrast Medication Name Total Dose  iohexol (OMNIPAQUE) 350 MG/ML injection 30 mL  Radiation/Fluoro Fluoro time: 3 (min)  DAP: 27.4 (Gycm2)  Cumulative Air Kerma: 353 (mGy)  Complications Complications documented before study signed (02/15/2021 6:14 PM)  No complications were associated with this study.  Documented by Jeanella Craze, RT - 02/15/2021 3:12 PM  Coronary Findings Diagnostic Dominance: Right  Left Main  Vessel is large. Vessel is angiographically normal.  Left Anterior Descending  Vessel is large.  Dist LAD lesion is 20% stenosed.  First Diagonal Branch  Vessel is large in size.  Left Circumflex  Vessel is large.  Mid Cx lesion is 25% stenosed with 100% stenosed side branch in 2nd Amagon Marginal Branch  Vessel is large in size.  Second Obtuse Marginal Branch  Vessel is small in size.  Collaterals  2nd Mrg filled by collaterals from 1st Mrg.    Collaterals  2nd Mrg filled by collaterals from [place].    Third Obtuse Marginal Branch  Vessel is small in size.  Fourth Obtuse Marginal Branch  Vessel is small in size.  Right Coronary Artery  Vessel is moderate in size.  Ost RCA to Prox RCA lesion is 20% stenosed.  Mid RCA lesion is 60% stenosed.  Right Posterior Descending Artery  Vessel is moderate in size.  Right Posterior Atrioventricular Artery  Vessel is small in size.  First Right Posterolateral Branch  Vessel is small in size.  Second Right Posterolateral Branch  Vessel is small in size.  Third Right Posterolateral Branch  Vessel is small in size.  Intervention  No interventions have been documented.  Right Heart Right Heart Pressures RA (mean): 15 mmHg RV (S/EDP): 39/16 mmHg PA (S/D, mean): 38/20 (26) mmHg PCWP (mean): 22 mmHg  Ao sat: 99% PA sat: 65%  Fick CO: 6.1 L/min Fick CI: 2.5 L/min/m^2  Left Heart Aortic Valve The aortic valve is calcified.  Coronary  Diagrams Diagnostic Dominance: Right  &&&&&&&&  Intervention Implants  No implant documentation for this case.   Syngo Images Show images for CARDIAC CATHETERIZATION  Images on Long Term Storage Show images for Caidyn, Henricksen to Procedure Log   Procedure Log  Hemo Data (last day) before discharge AO Systolic Cath Pressure AO Diastolic Cath Pressure AO Mean Cath Pressure PA Systolic Cath Pressure PA Diastolic Cath Pressure PA Mean Cath Pressure RA Wedge A Wave RA Wedge V Wave RV Systolic Cath Pressure RV Diastolic Cath Pressure RV End Diastolic RV Systolic RV End Diastolic RV dP/dt PCW A Wave PCW V Wave PCW Mean AO O2 Sat PA O2 Sat AO O2 Sat Fick C.O. Fick C.I.  -- -- -- -- -- -- 11 mmHg 10 mmHg -- -- -- 31 mmHg 11 mmHg 288 mmHg/sec 15 mmHg 16 mmHg 11 mmHg -- -- -- 6.14 L/min 2.51 L/min/m2  -- -- -- -- -- -- -- -- 31 mmHg 6 mmHg 11 mmHg -- -- -- -- -- -- -- -- -- -- --  -- -- -- 28 mmHg 11 mmHg 19 mmHg -- -- -- -- -- -- -- -- -- -- -- -- -- -- -- --  119 63 mmHg 76 mmHg -- -- -- -- -- -- -- -- -- -- -- -- -- -- -- -- -- -- --  -- -- -- -- -- -- -- -- -- -- -- -- -- -- -- -- -- 99.1 % -- SA -- --  -- -- -- -- -- -- -- -- -- -- -- --              ADDENDUM REPORT: 04/08/2021 17:48   CLINICAL DATA:  Severe Aortic Stenosis.   EXAM: Cardiac TAVR CT   TECHNIQUE: A non-contrast, gated CT scan was obtained with axial slices of 3 mm through the heart for aortic valve calcium scoring. A 120 kV retrospective, gated, contrast cardiac scan was obtained. Gantry rotation speed was 250 msecs and collimation was 0.6 mm. Nitroglycerin was not given. The 3D data set was reconstructed in 5% intervals of the 0-95% of the R-R cycle. Systolic and diastolic phases were analyzed on a dedicated workstation using MPR, MIP, and VRT modes. The patient received 100 cc of contrast.   FINDINGS: Image quality:  Excellent.   Noise artifact is: Limited.   Valve Morphology: Tricuspid aortic valve with  severe diffuse calcifications. Restricted movement in systole consistent with severe AS.   Aortic Valve Calcium score: 2733   Aortic annular dimension:   Phase assessed: 30%   Annular area: 574 mm2   Annular perimeter: 87.1 mm   Max diameter: 30.6 mm   Min diameter: 25.3 mm   Annular and subannular calcification: None.   Membranous septum length: 6.9 mm   Optimal coplanar projection: LAO 10 CRA 16   Coronary Artery Height above Annulus:   Left Main: 20.9 mm (high take off above LCC)   Right Coronary: 17.1 mm   Sinus of Valsalva Measurements:   Non-coronary: 36.2 mm   Right-coronary: 35.5 mm   Left-coronary: 36.9 mm   Sinus of Valsalva Height:   Non-coronary: 19.3 mm   Right-coronary: 21.8 mm   Left-coronary: 21.8 mm   Sinotubular Junction: 35 mm   Ascending Thoracic Aorta: 38 mm   Coronary Arteries: Normal coronary origin. Right dominance. The study was performed without use of NTG and is insufficient for plaque evaluation. Please refer to recent cardiac catheterization for coronary assessment. Severe 3-vessel calcifications.   Cardiac Morphology:   Right Atrium: Right atrial size is within normal limits.   Right Ventricle: The right ventricular cavity is within normal limits.   Left Atrium: Left atrial size is normal in size with no left atrial appendage filling defect.   Left Ventricle: The ventricular cavity size is within normal limits. There are no stigmata of prior infarction. There is no abnormal filling defect. Normal left ventricular function, LVEF=54%. No regional wall motion abnormalities.   Pulmonary arteries: Normal in size without proximal filling defect.   Pulmonary veins: Normal pulmonary venous drainage.   Pericardium: Normal thickness with no significant effusion or calcium present.   Mitral Valve: The mitral valve is normal structure without significant calcification.   Extra-cardiac findings: See attached radiology  report for non-cardiac structures.   IMPRESSION: 1. Tricuspid aortic valve with severe calcifications.   2. Annular measurements appropriate for 29 mm S3 (574 mm2).   3. No significant annular or subannular calcifications.   4. Sufficient coronary to annulus distance.   5. Optimal Fluoroscopic Angle for Delivery: LAO 10 CRA [place] T. Audie Box, MD     Electronically Signed   By: Eleonore Chiquito M.D.   On: 04/08/2021 17:48    Addended by Geralynn Rile, MD on 04/08/2021  5:51 PM   Study Result  Narrative & Impression  EXAM: OVER-READ INTERPRETATION  CT CHEST   The following report is an over-read performed by radiologist Dr. Vinnie Langton of [facility], [place] on 04/08/2021. This over-read does not include interpretation of cardiac or coronary anatomy or pathology. The coronary calcium score/coronary CTA interpretation by the cardiologist is attached.   COMPARISON:  Chest CTA 09/21/2018.   FINDINGS: Extracardiac findings will be described separately under dictation for contemporaneously obtained CTA chest, abdomen and pelvis.   IMPRESSION: Please see separate dictation for contemporaneously obtained CTA chest, abdomen and pelvis dated 04/08/2021 for full description of relevant extracardiac findings.   Electronically Signed: By: Vinnie Langton M.D. On: 04/08/2021 11:49      Narrative & Impression  CLINICAL DATA:  81 year old male with history of severe aortic stenosis. Preprocedural study prior to potential transcatheter aortic valve replacement (TAVR) procedure.   EXAM: CT ANGIOGRAPHY CHEST, ABDOMEN AND PELVIS   TECHNIQUE: Multidetector CT imaging through the chest,  abdomen and pelvis was performed using the standard protocol during bolus administration of intravenous contrast. Multiplanar reconstructed images and MIPs were obtained and reviewed to evaluate the vascular anatomy.   RADIATION DOSE REDUCTION: This exam was performed  according to the departmental dose-optimization program which includes automated exposure control, adjustment of the mA and/or kV according to patient size and/or use of iterative reconstruction technique.   CONTRAST:  16m OMNIPAQUE IOHEXOL 350 MG/ML SOLN   COMPARISON:  Chest CTA 09/21/2018. CT the abdomen and pelvis 04/20/2019.   FINDINGS: CTA CHEST FINDINGS   Cardiovascular: Heart size is normal. There is no significant pericardial fluid, thickening or pericardial calcification. There is aortic atherosclerosis, as well as atherosclerosis of the great vessels of the mediastinum and the coronary arteries, including calcified atherosclerotic plaque in the left main, left anterior descending, left circumflex and right coronary arteries. Thickening calcification of the aortic valve.   Mediastinum/Lymph Nodes: No pathologically enlarged mediastinal or hilar lymph nodes. Esophagus is unremarkable in appearance. No axillary lymphadenopathy.   Lungs/Pleura: No suspicious appearing pulmonary nodules or masses are noted. Small left-sided Bochdalek's hernia containing fat incidentally noted. No acute consolidative airspace disease. No pleural effusions.   Musculoskeletal/Soft Tissues: There are no aggressive appearing lytic or blastic lesions noted in the visualized portions of the skeleton.   CTA ABDOMEN AND PELVIS FINDINGS   Hepatobiliary: No suspicious cystic or solid hepatic lesions. No intra or extrahepatic biliary ductal dilatation. Gallbladder is normal in appearance.   Pancreas: No pancreatic mass. No pancreatic ductal dilatation. No pancreatic or peripancreatic fluid collections or inflammatory changes.   Spleen: Unremarkable.   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 In the anterior aspect of the interpolar region of the right renal moiety (axial image 134 of series 10 and coronal image 84 of series 6) there  is a 3.2 x 2.6 x 2.7 cm heterogeneously enhancing lesion, highly concerning for primary neoplasm such as a renal cell carcinoma. At this time, this lesion is encapsulated within Gerota's fascia and is separated from the right renal vein which is widely patent. Multiple other low-attenuation lesions scattered throughout both sides of the horseshoe kidney, largest of which are compatible with simple cysts, measuring up to 4.3 cm in diameter in the left kidney. Other subcentimeter low-attenuation lesions are too small to definitively characterize, but also favored to represent tiny cysts. No hydroureteronephrosis. Urinary bladder is normal in appearance. Left adrenal gland is normal in appearance. In the medial aspect of the right adrenal gland (axial image 109 of series 10) there is a 1.5 cm nodule which is incompletely characterized on today's examination, but similar to prior exams dating back to 2009, presumably a benign lesions such as an adenoma.   Stomach/Bowel: The appearance of the stomach is normal. There is no pathologic dilatation of small bowel or colon. Innumerable colonic diverticulae are noted, without surrounding inflammatory changes to suggest an acute diverticulitis at this time. Normal appendix.   Vascular/Lymphatic: Aortic atherosclerosis, without evidence of aneurysm in the abdominal or pelvic vasculature. Vascular findings and measurements pertinent to potential TAVR procedure, as detailed below. No lymphadenopathy noted in the abdomen or pelvis.   Reproductive: Prostate gland and seminal vesicles are unremarkable in appearance.   Other: Postoperative changes of ventral hernia repair are again noted, with small chronic postoperative fluid collection in the anterior abdominal wall measuring 7.6 x 2.0 cm, similar to the prior study, presumably a chronic seroma. No significant volume of ascites. No pneumoperitoneum.  Musculoskeletal: There are no aggressive  appearing lytic or blastic lesions noted in the visualized portions of the skeleton.   VASCULAR MEASUREMENTS PERTINENT TO TAVR:   AORTA:   Minimal Aortic Diameter-14 x 14 mm   Severity of Aortic Calcification-severe   RIGHT PELVIS:   Right Common Iliac Artery -   Minimal Diameter-10.9 x 8.6 mm   Tortuosity-mild   Calcification-severe   Right External Iliac Artery -   Minimal Diameter-8.6 x 7.0 mm   Tortuosity-mild-to-moderate   Calcification-mild   Right Common Femoral Artery -   Minimal Diameter-7.4 x 5.4 mm   Tortuosity-mild   Calcification-moderate   LEFT PELVIS:   Left Common Iliac Artery -   Minimal Diameter-8.8 x 9.5 mm   Tortuosity-mild   Calcification-severe   Left External Iliac Artery -   Minimal Diameter-8.8 x 6.8 mm   Tortuosity-mild   Calcification-mild   Left Common Femoral Artery -   Minimal Diameter-8.4 x 7.8 mm   Tortuosity-mild   Calcification-moderate   Review of the MIP images confirms the above findings.   IMPRESSION: 1. Vascular findings and measurements pertinent to potential TAVR procedure, as detailed above. 2. Severe thickening and calcification of the aortic valve, compatible with reported clinical history of severe aortic stenosis. 3. Cross-fused renal ectopia (horseshoe kidney), normal anatomical variant, incidentally noted. However, today's study demonstrates a new heterogeneously enhancing mass in the right renal moiety which is highly suspicious for renal cell carcinoma. At this time, this is encapsulated within Gerota's fascia, is separate from the right renal vein which is patent, and not associated with definite lymphadenopathy or signs of metastatic disease. Referral to Urology for further clinical evaluation and future management is strongly recommended in the near future. 4. Aortic atherosclerosis, in addition to left main and three-vessel coronary artery disease. 5. Severe colonic diverticulosis  without evidence of acute diverticulitis at this time. 6. Small right adrenal nodule, stable compared to prior studies, compatible with a benign lesions such as a small adenoma. 7. Additional incidental findings, as above.     Electronically Signed   By: Vinnie Langton M.D.   On: 04/08/2021 12:22   Impression:  This 81 year old gentleman has stage D, severe, symptomatic aortic stenosis with New York Heart Association class III symptoms of exertional fatigue and shortness of breath consistent with chronic diastolic congestive heart failure.  I have personally reviewed his 2D echocardiogram, cardiac catheterization, and CTA studies.  His most recent echocardiogram shows a severely calcified and thickened aortic valve with restricted leaflet mobility.  The mean gradient was measured at 35 mmHg but an echo in January 2022 showed the gradient to be 47 mmHg.  I think this discrepancy is probably due to the poor windows on his most recent echo.  His aortic valve looks severely stenotic.  Left ventricular ejection fraction is normal.  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  I think that aortic valve replacement is indicated in this patient for relief of his symptoms and to prevent progressive left ventricular deterioration.  I agree that given his advanced age and comorbidities that transcatheter aortic valve replacement is the best treatment for replacing his aortic valve.  His gated cardiac CTA shows anatomy suitable for TAVR using a SAPIEN 3 valve.  His abdominal and pelvic CTA shows adequate pelvic vascular anatomy to allow transfemoral insertion.  The patient and his sister were counseled at length regarding  treatment alternatives for management of severe symptomatic aortic stenosis. The risks and benefits of surgical intervention has been discussed in  detail. Long-term prognosis with medical therapy was discussed. Alternative approaches such as conventional surgical aortic valve replacement, transcatheter aortic valve replacement, and palliative medical therapy were compared and contrasted at length. This discussion was placed in the context of the patient's own specific clinical presentation and past medical history. All of their questions have been addressed.   Following the decision to proceed with transcatheter aortic valve replacement, a discussion was held regarding what types of management strategies would be attempted intraoperatively in the event of life-threatening complications, including whether or not the patient would be considered a candidate for the use of cardiopulmonary bypass and/or conversion to open sternotomy for attempted surgical intervention.  I do not think he is a candidate for emergent sternotomy to manage any intraoperative complications given his age, morbid obesity, comorbidities and restricted mobility.  The patient is aware of the fact that transient use of cardiopulmonary bypass may be necessary. The patient has been advised of a variety of complications that might develop including but not limited to risks of death, stroke, paravalvular leak, aortic dissection or other major vascular complications, aortic annulus rupture, device embolization, cardiac rupture or perforation, mitral regurgitation, acute myocardial infarction, arrhythmia, heart block or bradycardia requiring permanent pacemaker placement, congestive heart failure, respiratory failure, renal failure, pneumonia, infection, other late complications related to structural valve deterioration or migration, or other complications that might ultimately cause a temporary or permanent loss of functional independence or other long term morbidity. The patient provides full informed consent for the procedure as described and all questions were answered.    Plan:  He will  be scheduled for transfemoral TAVR using a SAPIEN 3 valve in the next few weeks.  I spent 60 minutes performing this consultation and > 50% of this time was spent face to face counseling and coordinating the care of this patient's severe symptomatic aortic stenosis.       Gaye Pollack, MD 04/10/2021

## 2021-04-10 NOTE — Progress Notes (Signed)
Pt called short stay asking about when his PAT appt would be and when surgery would be scheduled. Contacted scheduler Baker Janus to see if she had pt on for PAT schedule and she said he was, but seemed to not be on the schedule anymore. Theodosia Quay, RN [employer] to ask about this situation and per Ander Purpura, pt was scheduled for PAT but pt said he did not want to have surgery next week so PAT appt was cancelled. Per Ander Purpura, she will reach out to the pt to sort out scheduling. We called the pt back x2 with no answer. LVM letting pt know that Lauren will be in touch in regards to scheduling surgery with Dr. Cyndia Bent.

## 2021-04-12 ENCOUNTER — Other Ambulatory Visit (HOSPITAL_COMMUNITY): Payer: Medicare HMO

## 2021-04-12 ENCOUNTER — Telehealth: Payer: Self-pay

## 2021-04-12 DIAGNOSIS — H401122 Primary open-angle glaucoma, left eye, moderate stage: Secondary | ICD-10-CM | POA: Diagnosis not present

## 2021-04-12 LAB — HM DIABETES EYE EXAM

## 2021-04-12 NOTE — Telephone Encounter (Signed)
Pt called back and requested to be scheduled for TAVR on 05/14/21.

## 2021-04-12 NOTE — Telephone Encounter (Signed)
I have left a number of messages on the pt's mobile number to contact the office in regards to scheduling TAVR. The pt's home number will not accept messages at this time.  I did leave a voicemail on his sister's number to have the pt to reach out to our team to discuss scheduling TAVR in the next few weeks.

## 2021-04-15 ENCOUNTER — Other Ambulatory Visit: Payer: Self-pay | Admitting: Family Medicine

## 2021-04-15 DIAGNOSIS — E1140 Type 2 diabetes mellitus with diabetic neuropathy, unspecified: Secondary | ICD-10-CM

## 2021-04-15 NOTE — Telephone Encounter (Signed)
[facility] called and stated that pt usually gets his meds through a mail order pharmacy but they have not sent him his metformin so they asked if Dr. Ancil Boozer can send an Rx to them to hold him over until his Rx metFORMIN (GLUCOPHAGE-XR) 750 MG 24 hr tablet is sent by mail order pharmacy / please advise

## 2021-04-15 NOTE — Telephone Encounter (Signed)
Requested medication (s) are due for refill today: Yes  Requested medication (s) are on the active medication list: Yes  Last refill:  02/15/21  Future visit scheduled:No  Notes to clinic:  Unable to refill per protocol, last refill by hospital provider at discharge. Will need supply sent to [facility] and a short supply to [place], see notes.     Requested Prescriptions  Pending Prescriptions Disp Refills   metFORMIN (GLUCOPHAGE-XR) 750 MG 24 hr tablet 180 tablet 1    Sig: Take 2 tablets (1,500 mg total) by mouth daily with breakfast.     Endocrinology:  Diabetes - Biguanides Failed - 04/15/2021  4:17 PM      Failed - AA eGFR in normal range and within 360 days    GFR, Est African American  Date Value Ref Range Status  12/23/2019 92 > OR = 60 mL/min/1.28m Final   GFR, Est Non African American  Date Value Ref Range Status  12/23/2019 79 > OR = 60 mL/min/1.759mFinal   eGFR  Date Value Ref Range Status  03/22/2021 75 >59 mL/min/1.73 Final          Passed - Cr in normal range and within 360 days    Creat  Date Value Ref Range Status  01/16/2021 0.93 0.70 - 1.28 mg/dL Final   Creatinine, Ser  Date Value Ref Range Status  03/22/2021 1.02 0.76 - 1.27 mg/dL Final   Creatinine, Urine  Date Value Ref Range Status  01/16/2021 150 20 - 320 mg/dL Final          Passed - HBA1C is between 0 and 7.9 and within 180 days    Hemoglobin A1C  Date Value Ref Range Status  01/16/2021 6.9 (A) 4.0 - 5.6 % Final   HbA1c, POC (prediabetic range)  Date Value Ref Range Status  03/29/2018 6.3 5.7 - 6.4 % Final   HbA1c, POC (controlled diabetic range)  Date Value Ref Range Status  04/01/2019 7.6 (A) 0.0 - 7.0 % Final          Passed - Valid encounter within last 6 months    Recent Outpatient Visits           2 months ago Type 2 diabetes mellitus with microalbuminuria, without long-term current use of insulin ([facility]    8 months ago Type 2 diabetes mellitus with microalbuminuria, without long-term current use of insulin ([facility]   11 months ago Type 2 diabetes mellitus with microalbuminuria, without long-term current use of insulin ([facility]  [facility] St[facility]   1 year ago Type 2 diabetes mellitus with microalbuminuria, without long-term current use of insulin ([facility] Stager[facility]   1 year ago Type 2 diabetes mellitus with microalbuminuria, without long-term current use of insulin ([facility]  [facility] Sizer[facility]       Future Appointments             In 4 months Gollan, TiKathlene NovemberMD [facility]   In 8 months McGowan, ShGordan PaymentuClayton In 10 months  [facility]

## 2021-04-16 ENCOUNTER — Other Ambulatory Visit: Payer: Self-pay

## 2021-04-16 NOTE — Telephone Encounter (Signed)
Pt has an appt on 04/17/2021

## 2021-04-16 NOTE — Progress Notes (Signed)
Name: Jordan [name]   MRN: [id number]    DOB: [birth year]   Date:04/17/2021       Progress Note  Subjective  Chief Complaint  Follow Up  HPI  DM with microalbuminuria , obesity, dyslipidemia and HTN and  ED: he is back on Glipizide 88m ER and still on Metformin one daily  HgbA1C used to be above 9% but he has changed his diet, walking 5 days a week for 30 minutes ( with breaks every 10 minutes)  [facility]Marland KitchenHis A1C has been stable last two times at  7.2 %  and last visit it was down to 6.9 %   Cialis  is working well and [facility] because of history of angioedema, bp is controlled with Norvasc.  He denies polyphagia, polydipsia but has polyuria - also has BPH and has nocturia, denies recent episodes of hypoglycemia   Chronic bronchitis: he used to smoke but quit many years ago, he still has a cough that is productive at times, coughs in the mornings and has a pale yellow sputum, he denies  wheezing  He has some sob with activity and he is hopeful it will improved with Aortic valve replacement .    BPH: [place], on Flomax now . He still has nocturia 2-3 times per night . Stable.    HTN:  he is compliant with mediations, bp is at goal, no chest pain , he denies orthostatic changes or dizzines , he has some SOB with activity .  He has lower extremity edema but still stable.    Hyperlipidemia: taking lipitor and denies side effects, no myalgia. Reviewed last last LDL and at goal    Morbid obese : BMI above 40, he is trying to eat healthy and has been physically active    Aortic Valve stenosis: under the care of cardiologist - Dr. GRockey Situ   no syncope or chest pain, had echo normal EF. 50-55 %, Echo showed ascending aorta dilation. He is now also seeing [place]. [place]yndia [place], he is going to have a transfemoral Aortic Valve replacement soon.    Atherosclerosis of aorta: he is on statin  therapy, taking aspirin 81 mg daily and no side effects .Reviewed last labs LDL down to 58    Iron deficiency anemia:  secondary to rectal bleeding, last HCT dropped again but he has not noticed any bleeding, he had hemorrhoids and diverticulosis, he was released from Dr. WAllen Norris   Gout: taking allopurinol and not on diuretics  Last uric acid was normal . Denies any recent flares   Claudication: going on for months, used to walk for one hour but now takes a break after 15-20 minutes he needs to stop to rest because legs feels heavy and aching but able to go on for another 10 minutes afterwards. . He was seen by vascular surgeon and felt likely secondary from lumbar spine problems. Discussed risk and benefits of therapy for lumbar spine stenosis and since symptoms only when walking he will continue physical activity and stop to take breaks as needed . He states symptoms have been stable  Gait problems: he has OA on both knees he states symptoms have been stable, he uses a cane most of the time, occasionally uses a walker, he is still able to walk daily.   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[place]) 11/07/2014   Hyperlipidemia 11/07/2014   Essential hypertension 11/07/2014   Obesity, Class III, BMI 40-49.9 (morbid obesity) ([place]) 11/07/2014   Gout 11/07/2014   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   Epistaxis  09/21/2014   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   Severe aortic valve stenosis 12/02/2012   Constipation 10/26/2012   Incisional hernia, without obstruction or gangrene 10/26/2012   Left inguinal hernia     Past Surgical History:  Procedure Laterality Date   cataract surgery     COLONOSCOPY  2012   COLONOSCOPY N/A 04/22/2019   Procedure: COLONOSCOPY;  Surgeon: Irving Copas., MD;  Location: [facility];  Service: Gastroenterology;  Laterality: N/A;   EYE SURGERY  2011   cataract   RIGHT HEART CATH AND CORONARY ANGIOGRAPHY Bilateral 02/15/2021   Procedure: RIGHT HEART CATH AND CORONARY ANGIOGRAPHY;  Surgeon: Nelva Bush, MD;  Location: [facility];  Service: Cardiovascular;  Laterality: Bilateral;   UMBILICAL HERNIA REPAIR  2011    Family History  Problem Relation Age of Onset   Heart disease Father    Hypertension Father    Diabetes Sister    Diabetes Brother    Prostate cancer Brother    Diabetes Sister    Kidney disease Neg Hx    Kidney cancer Neg Hx    Bladder Cancer Neg Hx    Colon cancer Neg Hx    Esophageal cancer Neg Hx    Inflammatory bowel disease Neg Hx    Liver disease Neg Hx    Pancreatic cancer Neg Hx    Rectal cancer Neg Hx    Stomach cancer Neg Hx     Social History   Tobacco Use   Smoking status: Former    Packs/day: 1.00    Years: 10.00    Pack years: 10.00    Types: Cigarettes    Start date: 03/17/1965    Quit date: 11/27/1975    Years since quitting: 45.4   Smokeless tobacco: Never   Tobacco comments:    quit 40 years  Substance Use Topics   Alcohol use: Yes    Alcohol/week: 1.0 standard drink    Types: 1 Standard drinks or equivalent per week    Comment: socially - 1 x year     Current Outpatient Medications:    ACCU-CHEK AVIVA PLUS test strip, TEST TWO TIMES DAILY AS NEEDED AS DIRECTED, Disp: 200 strip, Rfl: 2   Accu-Chek FastClix Lancets MISC, 1 each by Subdermal route 2 (two) times daily., Disp: 102 each, Rfl: 2    allopurinol (ZYLOPRIM) 300 MG tablet, TAKE 1 TABLET EVERY DAY, Disp: 90 tablet, Rfl: 1   amLODipine (NORVASC) 10 MG tablet, TAKE 1 TABLET EVERY DAY, Disp: 90 tablet, Rfl: 1   aspirin EC 81 MG tablet, Take 1 tablet (81 mg total) by mouth daily. Swallow whole. (Patient taking differently: Take 81 mg by mouth at bedtime. Swallow whole.), Disp: , Rfl:    atorvastatin (LIPITOR) 40 MG tablet, TAKE 1 TABLET EVERY DAY (Patient  taking differently: Take 40 mg by mouth at bedtime.), Disp: 90 tablet, Rfl: 1   Blood Glucose Monitoring Suppl (ACCU-CHEK AVIVA PLUS) w/Device KIT, 1 each by Does not apply route 2 (two) times daily. Use as directed, Disp: 100 kit, Rfl: 12   Cholecalciferol (VITAMIN D3 PO), Take 1 tablet by mouth every Monday, Wednesday, and Friday., Disp: , Rfl:    furosemide (LASIX) 20 MG tablet, Take 1 tablet (20 mg total) by mouth daily., Disp: 30 tablet, Rfl: 5   glipiZIDE (GLUCOTROL XL) 5 MG 24 hr tablet, TAKE 1 TABLET (5 MG TOTAL) BY MOUTH DAILY WITH BREAKFAST., Disp: 90 tablet, Rfl: 1   latanoprost (XALATAN) 0.005 % ophthalmic solution, Place 1 drop into the left eye at bedtime. , Disp: , Rfl:    metFORMIN (GLUCOPHAGE-XR) 750 MG 24 hr tablet, Take 2 tablets (1,500 mg total) by mouth daily with breakfast., Disp: 180 tablet, Rfl: 1   metoprolol tartrate (LOPRESSOR) 100 MG tablet, Take as directed prior to 1/23 CT Scan, Disp: 1 tablet, Rfl: 0   Omega-3 Fatty Acids (FISH OIL PO), Take 1 capsule by mouth 2 (two) times daily. , Disp: , Rfl:    polyethylene glycol (MIRALAX / GLYCOLAX) packet, Take 17 g by mouth at bedtime. , Disp: , Rfl:    tadalafil (CIALIS) 10 MG tablet, Take 1-2 tablets (10-20 mg total) by mouth every three (3) days as needed for erectile dysfunction., Disp: 30 tablet, Rfl: 0   tamsulosin (FLOMAX) 0.4 MG CAPS capsule, TAKE 1 CAPSULE EVERY DAY, Disp: 90 capsule, Rfl: 1  Allergies  Allergen Reactions   Ace Inhibitors Other (See Comments)    angioedema    I personally reviewed  active problem list, medication list, allergies, family history, social history, health maintenance with the patient/caregiver today.   ROS  Ten systems reviewed and is negative except as mentioned in HPI   Objective  Vitals:   04/17/21 1102  BP: 120/70  Pulse: 91  Resp: 16  SpO2: 98%  Weight: 296 lb (134.3 kg)  Height: '5\' 9"'  (1.753 m)    Body mass index is 43.71 kg/m.  Physical Exam  Constitutional: Patient appears well-developed and well-nourished. Obese  No distress.  HEENT: head atraumatic, normocephalic, pupils equal and reactive to light, neck supple, Cardiovascular: Normal rate, regular rhythm and normal heart sounds.  4/5 holosystolic  murmur heard. Trace  BLE edema. Pulmonary/Chest: Effort normal and breath sounds normal. No respiratory distress. Abdominal: Soft.  There is no tenderness. Psychiatric: Patient has a normal mood and affect. behavior is normal. Judgment and thought content normal.   Recent Results (from the past 2160 hour(s))  Basic metabolic panel     Status: Abnormal   Collection Time: 02/12/21  3:41 PM  Result Value Ref Range   Glucose 289 (H) 70 - 99 mg/dL   BUN 15 8 - 27 mg/dL   Creatinine, Ser 0.99 0.76 - 1.27 mg/dL   eGFR 77 >59 mL/min/1.73   BUN/Creatinine Ratio 15 10 - 24   Sodium 134 134 - 144 mmol/L   Potassium 4.5 3.5 - 5.2 mmol/L   Chloride 103 96 - 106 mmol/L   CO2 18 (L) 20 - 29 mmol/L   Calcium 9.5 8.6 - 10.2 mg/dL  CBC     Status: Abnormal   Collection Time: 02/12/21  3:41 PM  Result Value Ref Range   WBC 5.4 3.4 - 10.8 x10E3/uL   RBC 3.89 (L) 4.14 - 5.80 x10E6/uL   Hemoglobin 10.7 (L) 13.0 -  17.7 g/dL   Hematocrit 33.9 (L) 37.5 - 51.0 %   MCV 87 79 - 97 fL   MCH 27.5 26.6 - 33.0 pg   MCHC 31.6 31.5 - 35.7 g/dL   RDW 15.2 11.6 - 15.4 %   Platelets 247 150 - 450 x10E3/uL  Glucose, capillary     Status: Abnormal   Collection Time: 02/15/21 12:26 PM  Result Value Ref Range   Glucose-Capillary 186 (H) 70 - 99 mg/dL     Comment: Glucose reference range applies only to samples taken after fasting for at least 8 hours.  Glucose, capillary     Status: Abnormal   Collection Time: 02/15/21  3:24 PM  Result Value Ref Range   Glucose-Capillary 126 (H) 70 - 99 mg/dL    Comment: Glucose reference range applies only to samples taken after fasting for at least 8 hours.  ECHOCARDIOGRAM COMPLETE     Status: None   Collection Time: 03/15/21  3:45 PM  Result Value Ref Range   Area-P 1/2 3.93 cm2   S' Lateral 2.90 cm   AV Area mean vel 1.39 cm2   AR max vel 1.44 cm2   AV Area VTI 1.37 cm2   Ao pk vel 3.69 m/s   AV Mean grad 35.0 mmHg   AV Peak grad 54.5 mmHg  Basic metabolic panel     Status: Abnormal   Collection Time: 03/22/21  3:57 PM  Result Value Ref Range   Glucose 153 (H) 70 - 99 mg/dL   BUN 20 8 - 27 mg/dL   Creatinine, Ser 1.02 0.76 - 1.27 mg/dL   eGFR 75 >59 mL/min/1.73   BUN/Creatinine Ratio 20 10 - 24   Sodium 138 134 - 144 mmol/L   Potassium 4.5 3.5 - 5.2 mmol/L   Chloride 100 96 - 106 mmol/L   CO2 18 (L) 20 - 29 mmol/L   Calcium 9.9 8.6 - 10.2 mg/dL     PHQ2/9: Depression screen [facility] 2/9 04/17/2021 02/05/2021 01/16/2021 08/15/2020 04/24/2020  Decreased Interest 0 0 0 2 0  Down, Depressed, Hopeless 0 0 0 1 0  PHQ - 2 Score 0 0 0 3 0  Altered sleeping 0 0 0 1 1  Tired, decreased energy 0 3 3 0 1  Change in appetite 0 0 0 0 0  Feeling bad or failure about yourself  0 0 0 0 0  Trouble concentrating 0 0 0 0 0  Moving slowly or fidgety/restless 0 0 0 0 0  Suicidal thoughts 0 0 0 0 0  PHQ-9 Score 0 '3 3 4 2  ' Difficult doing work/chores - - - - -  Some recent data might be hidden    phq 9 is negative   Fall Risk: Fall Risk  04/17/2021 02/05/2021 01/16/2021 08/15/2020 04/24/2020  Falls in the past year? 0 0 0 0 0  Number falls in past yr: 0 0 0 0 0  Injury with Fall? 0 0 0 0 0  Risk for fall due to : No Fall Risks No Fall Risks No Fall Risks - -  Follow up Falls prevention discussed Falls prevention  discussed Falls prevention discussed - -      Functional Status Survey: Is the patient deaf or have difficulty hearing?: Yes Does the patient have difficulty seeing, even when wearing glasses/contacts?: Yes Does the patient have difficulty concentrating, remembering, or making decisions?: No Does the patient have difficulty walking or climbing stairs?: Yes Does the patient have difficulty dressing  or bathing?: No Does the patient have difficulty doing errands alone such as visiting a doctor's office or shopping?: No    Assessment & Plan  1. 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HCC)  Keep follow up with cardiologist   2. Atherosclerosis of aorta (HCC)  - atorvastatin (LIPITOR) 40 MG tablet; Take 1 tablet (40 mg total) by mouth at bedtime.  Dispense: 90 tablet; Refill: 1  3. Obesity, Class III, BMI 40-49.9 (morbid obesity) ([place])   4. Simple chronic bronchitis ([place])   5. Hypertension associated with diabetes ([place])  - Hemoglobin A1c  6. Claudication of both lower extremities ([place])   7. 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  Dispense: 90 tablet; Refill: 1 - metFORMIN (GLUCOPHAGE-XR) 750 MG 24 hr tablet; Take 2 tablets (1,500 mg total) by mouth daily with breakfast.  Dispense: 180 tablet; Refill: 1  8. Benign prostatic hyperplasia with nocturia  - tamsulosin (FLOMAX) 0.4 MG CAPS capsule; Take 1 capsule (0.4 mg total) by mouth daily.  Dispense: 90 capsule; Refill: 1  9. Primary osteoarthritis of both knees   10. Aortic valve stenosis, mild  11. Iron deficiency anemia due to chronic blood loss  - CBC with Differential/Platelet - Iron, TIBC and Ferritin Panel  12. Essential hypertension  - amLODipine (NORVASC) 10 MG tablet; Take 1 tablet (10 mg total) by mouth daily.  Dispense: 90 tablet; Refill: 1  13. Controlled gout  - allopurinol  (ZYLOPRIM) 300 MG tablet; Take 1 tablet (300 mg total) by mouth daily.  Dispense: 90 tablet; Refill: 1  14. Mixed hyperlipidemia  - atorvastatin (LIPITOR) 40 MG tablet; Take 1 tablet (40 mg total) by mouth at bedtime.  Dispense: 90 tablet; Refill: 1  15. ED (erectile dysfunction) of organic origin  - tadalafil (CIALIS) 10 MG tablet; Take 1-2 tablets (10-20 mg total) by mouth every three (3) days as needed for erectile dysfunction.  Dispense: 30 tablet; Refill: 0

## 2021-04-16 NOTE — Telephone Encounter (Signed)
Pt called in to follow up on a short supply of Metformin being sent in, please call back.

## 2021-04-17 ENCOUNTER — Ambulatory Visit (INDEPENDENT_AMBULATORY_CARE_PROVIDER_SITE_OTHER): Payer: Medicare HMO | Admitting: Family Medicine

## 2021-04-17 ENCOUNTER — Other Ambulatory Visit: Payer: Self-pay

## 2021-04-17 ENCOUNTER — Encounter: Payer: Self-pay | Admitting: Family Medicine

## 2021-04-17 VITALS — BP 120/70 | HR 91 | Resp 16 | Ht 69.0 in | Wt 296.0 lb

## 2021-04-17 DIAGNOSIS — E782 Mixed hyperlipidemia: Secondary | ICD-10-CM

## 2021-04-17 DIAGNOSIS — R351 Nocturia: Secondary | ICD-10-CM

## 2021-04-17 DIAGNOSIS — E1140 Type 2 diabetes mellitus with diabetic neuropathy, unspecified: Secondary | ICD-10-CM

## 2021-04-17 DIAGNOSIS — I77810 Thoracic aortic ectasia: Secondary | ICD-10-CM

## 2021-04-17 DIAGNOSIS — I700 Atherosclerosis of aorta: Secondary | ICD-10-CM | POA: Diagnosis not present

## 2021-04-17 DIAGNOSIS — N529 Male erectile dysfunction, unspecified: Secondary | ICD-10-CM

## 2021-04-17 DIAGNOSIS — I739 Peripheral vascular disease, unspecified: Secondary | ICD-10-CM

## 2021-04-17 DIAGNOSIS — N401 Enlarged prostate with lower urinary tract symptoms: Secondary | ICD-10-CM

## 2021-04-17 DIAGNOSIS — M170 Bilateral primary osteoarthritis of knee: Secondary | ICD-10-CM | POA: Diagnosis not present

## 2021-04-17 DIAGNOSIS — N521 Erectile dysfunction due to diseases classified elsewhere: Secondary | ICD-10-CM

## 2021-04-17 DIAGNOSIS — E1159 Type 2 diabetes mellitus with other circulatory complications: Secondary | ICD-10-CM | POA: Diagnosis not present

## 2021-04-17 DIAGNOSIS — J410 Simple chronic bronchitis: Secondary | ICD-10-CM

## 2021-04-17 DIAGNOSIS — I152 Hypertension secondary to endocrine disorders: Secondary | ICD-10-CM

## 2021-04-17 DIAGNOSIS — I10 Essential (primary) hypertension: Secondary | ICD-10-CM

## 2021-04-17 DIAGNOSIS — D500 Iron deficiency anemia secondary to blood loss (chronic): Secondary | ICD-10-CM | POA: Diagnosis not present

## 2021-04-17 DIAGNOSIS — I350 Nonrheumatic aortic (valve) stenosis: Secondary | ICD-10-CM

## 2021-04-17 DIAGNOSIS — M109 Gout, unspecified: Secondary | ICD-10-CM

## 2021-04-17 MED ORDER — AMLODIPINE BESYLATE 10 MG PO TABS: 10.0000 mg | ORAL_TABLET | Freq: Every day | ORAL | 1 refills | Status: DC

## 2021-04-17 MED ORDER — TADALAFIL 10 MG PO TABS: 10.0000 mg | ORAL_TABLET | ORAL | 0 refills | Status: DC | PRN

## 2021-04-17 MED ORDER — GLIPIZIDE ER 5 MG PO TB24: 5.0000 mg | ORAL_TABLET | Freq: Every day | ORAL | 1 refills | Status: DC

## 2021-04-17 MED ORDER — ATORVASTATIN CALCIUM 40 MG PO TABS: 40.0000 mg | ORAL_TABLET | Freq: Every day | ORAL | 1 refills | Status: DC

## 2021-04-17 MED ORDER — BD SWAB SINGLE USE REGULAR PADS: 1.0000 | MEDICATED_PAD | 3 refills | Status: DC | PRN

## 2021-04-17 MED ORDER — ACCU-CHEK GUIDE CONTROL VI LIQD: 1.0000 | 3 refills | Status: DC | PRN

## 2021-04-17 MED ORDER — METFORMIN HCL ER 750 MG PO TB24: 1500.0000 mg | ORAL_TABLET | Freq: Every day | ORAL | 1 refills | Status: DC

## 2021-04-17 MED ORDER — TAMSULOSIN HCL 0.4 MG PO CAPS: 0.4000 mg | ORAL_CAPSULE | Freq: Every day | ORAL | 1 refills | Status: DC

## 2021-04-17 MED ORDER — ALLOPURINOL 300 MG PO TABS: 300.0000 mg | ORAL_TABLET | Freq: Every day | ORAL | 1 refills | Status: DC

## 2021-04-18 LAB — CBC WITH DIFFERENTIAL/PLATELET
Absolute Monocytes: 603 cells/uL (ref 200–950)
Basophils Absolute: 20 cells/uL (ref 0–200)
Basophils Relative: 0.4 %
Eosinophils Absolute: 39 cells/uL (ref 15–500)
Eosinophils Relative: 0.8 %
HCT: 38.5 % (ref 38.5–50.0)
Hemoglobin: 11.8 g/dL — ABNORMAL LOW (ref 13.2–17.1)
Lymphs Abs: 995 cells/uL (ref 850–3900)
MCH: 25.3 pg — ABNORMAL LOW (ref 27.0–33.0)
MCHC: 30.6 g/dL — ABNORMAL LOW (ref 32.0–36.0)
MCV: 82.4 fL (ref 80.0–100.0)
MPV: 12.2 fL (ref 7.5–12.5)
Monocytes Relative: 12.3 %
Neutro Abs: 3244 cells/uL (ref 1500–7800)
Neutrophils Relative %: 66.2 %
Platelets: 212 10*3/uL (ref 140–400)
RBC: 4.67 10*6/uL (ref 4.20–5.80)
RDW: 16.7 % — ABNORMAL HIGH (ref 11.0–15.0)
Total Lymphocyte: 20.3 %
WBC: 4.9 10*3/uL (ref 3.8–10.8)

## 2021-04-18 LAB — HEMOGLOBIN A1C
Hgb A1c MFr Bld: 7.9 % of total Hgb — ABNORMAL HIGH (ref <5.7)
Mean Plasma Glucose: 180 mg/dL
eAG (mmol/L): 10 mmol/L

## 2021-04-18 LAB — IRON,TIBC AND FERRITIN PANEL
%SAT: 12 % (calc) — ABNORMAL LOW (ref 20–48)
Ferritin: 10 ng/mL — ABNORMAL LOW (ref 24–380)
Iron: 45 ug/dL — ABNORMAL LOW (ref 50–180)
TIBC: 381 mcg/dL (calc) (ref 250–425)

## 2021-04-19 ENCOUNTER — Ambulatory Visit: Payer: Medicare HMO | Admitting: Family Medicine

## 2021-04-25 ENCOUNTER — Other Ambulatory Visit: Payer: Self-pay

## 2021-04-25 DIAGNOSIS — E1140 Type 2 diabetes mellitus with diabetic neuropathy, unspecified: Secondary | ICD-10-CM

## 2021-04-25 DIAGNOSIS — N521 Erectile dysfunction due to diseases classified elsewhere: Secondary | ICD-10-CM

## 2021-04-25 MED ORDER — BD SWAB SINGLE USE REGULAR PADS: 1.0000 | MEDICATED_PAD | 3 refills | Status: DC | PRN

## 2021-04-25 MED ORDER — ACCU-CHEK GUIDE CONTROL VI LIQD: 1.0000 | 3 refills | Status: DC | PRN

## 2021-05-01 ENCOUNTER — Other Ambulatory Visit: Payer: Self-pay | Admitting: Physician Assistant

## 2021-05-01 ENCOUNTER — Encounter: Payer: Self-pay | Admitting: Physician Assistant

## 2021-05-01 DIAGNOSIS — I350 Nonrheumatic aortic (valve) stenosis: Secondary | ICD-10-CM

## 2021-05-09 DIAGNOSIS — D49511 Neoplasm of unspecified behavior of right kidney: Secondary | ICD-10-CM | POA: Diagnosis not present

## 2021-05-09 NOTE — Progress Notes (Signed)
Surgical Instructions    Your procedure is scheduled on Tuesday February 28th.  Report to [facility] Main Entrance "A" at 10:15 A.M., then check in with the Admitting office.  Call this number if you have problems the morning of surgery:  [phone]   If you have any questions prior to your surgery date call [phone]: Open Monday-Friday 8am-4pm    Remember:  Do not eat r drink after midnight the night before your surgery    Take these medicines the morning of surgery with A SIP OF WATER:  NONE  Per Dr. Antionette Char office - Please continue taking all of your current medications through the day before surgery, except hold Metformin 2/27 (last dose 2/26PM). On the day of surgery take no medications.   As of today, STOP taking any Aspirin (unless otherwise instructed by your surgeon) Aleve, Naproxen, Ibuprofen, Motrin, Advil, Goody's, BC's, all herbal medications, fish oil, and all vitamins.  WHAT DO I DO ABOUT MY DIABETES MEDICATION?   Do not take oral diabetes medicines (Metformin) the morning of surgery.  Per Dr. Antionette Char office - Please continue taking all of your current medications through the day before surgery, except hold Metformin 2/27 (last dose 2/26PM). On the day of surgery take no medications.  HOW TO MANAGE YOUR DIABETES BEFORE AND AFTER SURGERY  Why is it important to control my blood sugar before and after surgery? Improving blood sugar levels before and after surgery helps healing and can limit problems. A way of improving blood sugar control is eating a healthy diet by:  Eating less sugar and carbohydrates  Increasing activity/exercise  Talking with your doctor about reaching your blood sugar goals High blood sugars (greater than 180 mg/dL) can raise your risk of infections and slow your recovery, so you will need to focus on controlling your diabetes during the weeks before surgery. Make sure that the doctor who takes care of your diabetes knows about your  planned surgery including the date and location.  How do I manage my blood sugar before surgery? Check your blood sugar at least 4 times a day, starting 2 days before surgery, to make sure that the level is not too high or low.  Check your blood sugar the morning of your surgery when you wake up and every 2 hours until you get to the Short Stay unit.  If your blood sugar is less than 70 mg/dL, you will need to treat for low blood sugar: Do not take insulin. Treat a low blood sugar (less than 70 mg/dL) with  cup of clear juice (cranberry or apple), 4 glucose tablets, OR glucose gel. Recheck blood sugar in 15 minutes after treatment (to make sure it is greater than 70 mg/dL). If your blood sugar is not greater than 70 mg/dL on recheck, call [phone] for further instructions. Report your blood sugar to the short stay nurse when you get to Short Stay.  If you are admitted to the hospital after surgery: Your blood sugar will be checked by the staff and you will probably be given insulin after surgery (instead of oral diabetes medicines) to make sure you have good blood sugar levels. The goal for blood sugar control after surgery is 80-180 mg/dL.           Do not wear jewelry  Do not wear lotions, powders, colognes, or deodorant. Do not shave 48 hours prior to surgery.  Men may shave face and neck. Do not bring valuables to the hospital. Do not  wear nail polish, gel polish, artificial nails, or any other type of covering on natural nails (fingers and toes) If you have artificial nails or gel coating that need to be removed by a nail salon, please have this removed prior to surgery. Artificial nails or gel coating may interfere with anesthesia's ability to adequately monitor your vital signs.  [facility] is not responsible for any belongings or valuables. .   Do NOT Smoke (Tobacco/Vaping)  24 hours prior to your procedure  If you use a CPAP at night, you may bring your mask for your  overnight stay.   Contacts, glasses, hearing aids, dentures or partials may not be worn into surgery, please bring cases for these belongings   For patients admitted to the hospital, discharge time will be determined by your treatment team.   Patients discharged the day of surgery will not be allowed to drive home, and someone needs to stay with them for 24 hours.  NO VISITORS WILL BE ALLOWED IN PRE-OP WHERE PATIENTS ARE PREPPED FOR SURGERY.  ONLY 1 SUPPORT PERSON MAY BE PRESENT IN THE WAITING ROOM WHILE YOU ARE IN SURGERY.  IF YOU ARE TO BE ADMITTED, ONCE YOU ARE IN YOUR ROOM YOU WILL BE ALLOWED TWO (2) VISITORS. 1 (ONE) VISITOR MAY STAY OVERNIGHT BUT MUST ARRIVE TO THE ROOM BY 8pm.  Minor children may have two parents present. Special consideration for safety and communication needs will be reviewed on a case by case basis.  Special instructions:    Oral Hygiene is also important to reduce your risk of infection.  Remember - BRUSH YOUR TEETH THE MORNING OF SURGERY WITH YOUR REGULAR TOOTHPASTE   [place]- Preparing For Surgery  Before surgery, you can play an important role. Because skin is not sterile, your skin needs to be as free of germs as possible. You can reduce the number of germs on your skin by washing with CHG (chlorahexidine gluconate) Soap before surgery.  CHG is an antiseptic cleaner which kills germs and bonds with the skin to continue killing germs even after washing.     Please do not use if you have an allergy to CHG or antibacterial soaps. If your skin becomes reddened/irritated stop using the CHG.  Do not shave (including legs and underarms) for at least 48 hours prior to first CHG shower. It is OK to shave your face.  Please follow these instructions carefully.     Shower the NIGHT BEFORE SURGERY and the MORNING OF SURGERY with CHG Soap.   If you chose to wash your hair, wash your hair first as usual with your normal shampoo. After you shampoo, rinse your hair and  body thoroughly to remove the shampoo.  Then [employer] and genitals (private parts) with your normal soap and rinse thoroughly to remove soap.  After that Use CHG Soap as you would any other liquid soap. You can apply CHG directly to the skin and wash gently with a scrungie or a clean washcloth.   Apply the CHG Soap to your body ONLY FROM THE NECK DOWN.  Do not use on open wounds or open sores. Avoid contact with your eyes, ears, mouth and genitals (private parts). Wash Face and genitals (private parts)  with your normal soap.   Wash thoroughly, paying special attention to the area where your surgery will be performed.  Thoroughly rinse your body with warm water from the neck down.  DO NOT shower/wash with your normal soap after using and rinsing  off the CHG Soap.  Pat yourself dry with a CLEAN TOWEL.  Wear CLEAN PAJAMAS to bed the night before surgery  Place CLEAN SHEETS on your bed the night before your surgery  DO NOT SLEEP WITH PETS.   Day of Surgery:  Take a shower with CHG soap. Wear Clean/Comfortable clothing the morning of surgery Do not apply any deodorants/lotions.   Remember to brush your teeth WITH YOUR REGULAR TOOTHPASTE.    COVID testing  If you are going to stay overnight or be admitted after your procedure/surgery and require a pre-op COVID test, please follow these instructions after your COVID test   You are not required to quarantine however you are required to wear a well-fitting mask when you are out and around people not in your household.  If your mask becomes wet or soiled, replace with a new one.  Wash your hands often with soap and water for 20 seconds or clean your hands with an alcohol-based hand sanitizer that contains at least 60% alcohol.  Do not share personal items.  Notify your provider: if you are in close contact with someone who has COVID  or if you develop a fever of 100.4 or greater, sneezing, cough, sore throat, shortness of breath or  body aches.    Please read over the following fact sheets that you were given.

## 2021-05-10 ENCOUNTER — Encounter (HOSPITAL_COMMUNITY)
Admission: RE | Admit: 2021-05-10 | Discharge: 2021-05-10 | Disposition: A | Discharge status: Home / Self Care | Payer: Medicare HMO | Source: Ambulatory Visit | Attending: Cardiovascular Disease | Admitting: Cardiovascular Disease

## 2021-05-10 ENCOUNTER — Other Ambulatory Visit (HOSPITAL_COMMUNITY): Payer: Medicare HMO

## 2021-05-10 ENCOUNTER — Other Ambulatory Visit: Payer: Self-pay

## 2021-05-10 ENCOUNTER — Other Ambulatory Visit: Payer: Self-pay | Admitting: Urology

## 2021-05-10 ENCOUNTER — Encounter (HOSPITAL_COMMUNITY): Payer: Self-pay

## 2021-05-10 ENCOUNTER — Ambulatory Visit (HOSPITAL_COMMUNITY)
Admission: RE | Admit: 2021-05-10 | Discharge: 2021-05-10 | Disposition: A | Discharge status: Home / Self Care | Payer: Medicare HMO | Source: Ambulatory Visit | Attending: Physician Assistant | Admitting: Physician Assistant

## 2021-05-10 VITALS — BP 142/63 | HR 95 | Temp 97.7°F | Resp 17 | Ht 69.0 in | Wt 291.0 lb

## 2021-05-10 DIAGNOSIS — I350 Nonrheumatic aortic (valve) stenosis: Secondary | ICD-10-CM

## 2021-05-10 DIAGNOSIS — D49511 Neoplasm of unspecified behavior of right kidney: Secondary | ICD-10-CM

## 2021-05-10 DIAGNOSIS — Z01818 Encounter for other preprocedural examination: Secondary | ICD-10-CM

## 2021-05-10 DIAGNOSIS — Z79899 Other long term (current) drug therapy: Secondary | ICD-10-CM | POA: Diagnosis not present

## 2021-05-10 DIAGNOSIS — Z20822 Contact with and (suspected) exposure to covid-19: Secondary | ICD-10-CM | POA: Diagnosis not present

## 2021-05-10 DIAGNOSIS — Z01811 Encounter for preprocedural respiratory examination: Secondary | ICD-10-CM

## 2021-05-10 DIAGNOSIS — J986 Disorders of diaphragm: Secondary | ICD-10-CM | POA: Insufficient documentation

## 2021-05-10 LAB — COMPREHENSIVE METABOLIC PANEL
ALT: 16 U/L (ref 0–44)
AST: 26 U/L (ref 15–41)
Albumin: 3.7 g/dL (ref 3.5–5.0)
Alkaline Phosphatase: 55 U/L (ref 38–126)
Anion gap: 14 (ref 5–15)
BUN: 13 mg/dL (ref 8–23)
CO2: 20 mmol/L — ABNORMAL LOW (ref 22–32)
Calcium: 9.7 mg/dL (ref 8.9–10.3)
Chloride: 100 mmol/L (ref 98–111)
Creatinine, Ser: 0.93 mg/dL (ref 0.61–1.24)
GFR, Estimated: >60 mL/min (ref >60)
Glucose, Bld: 200 mg/dL — ABNORMAL HIGH (ref 70–99)
Potassium: 4.3 mmol/L (ref 3.5–5.1)
Sodium: 134 mmol/L — ABNORMAL LOW (ref 135–145)
Total Bilirubin: 0.6 mg/dL (ref 0.3–1.2)
Total Protein: 7.7 g/dL (ref 6.5–8.1)

## 2021-05-10 LAB — SURGICAL PCR SCREEN
MRSA, PCR: NEGATIVE
Staphylococcus aureus: POSITIVE — AB

## 2021-05-10 LAB — URINALYSIS, ROUTINE W REFLEX MICROSCOPIC
Bilirubin Urine: NEGATIVE
Glucose, UA: NEGATIVE mg/dL
Hgb urine dipstick: NEGATIVE
Ketones, ur: NEGATIVE mg/dL
Leukocytes,Ua: NEGATIVE
Nitrite: NEGATIVE
Protein, ur: NEGATIVE mg/dL
Specific Gravity, Urine: 1.005 (ref 1.005–1.030)
pH: 5 (ref 5.0–8.0)

## 2021-05-10 LAB — BLOOD GAS, ARTERIAL
Acid-Base Excess: 0.3 mmol/L (ref 0.0–2.0)
Bicarbonate: 23.5 mmol/L (ref 20.0–28.0)
Drawn by: 53845
O2 Saturation: 98.1 %
Patient temperature: 37
pCO2 arterial: 33 mmHg (ref 32–48)
pH, Arterial: 7.46 — ABNORMAL HIGH (ref 7.35–7.45)
pO2, Arterial: 101 mmHg (ref 83–108)

## 2021-05-10 LAB — CBC
HCT: 40.9 % (ref 39.0–52.0)
Hemoglobin: 12.5 g/dL — ABNORMAL LOW (ref 13.0–17.0)
MCH: 25.8 pg — ABNORMAL LOW (ref 26.0–34.0)
MCHC: 30.6 g/dL (ref 30.0–36.0)
MCV: 84.3 fL (ref 80.0–100.0)
Platelets: 198 10*3/uL (ref 150–400)
RBC: 4.85 MIL/uL (ref 4.22–5.81)
RDW: 20.9 % — ABNORMAL HIGH (ref 11.5–15.5)
WBC: 4.9 10*3/uL (ref 4.0–10.5)
nRBC: 0 % (ref 0.0–0.2)

## 2021-05-10 LAB — PROTIME-INR
INR: 0.9 (ref 0.8–1.2)
Prothrombin Time: 12.2 seconds (ref 11.4–15.2)

## 2021-05-10 LAB — TYPE AND SCREEN
ABO/RH(D): A POS
Antibody Screen: NEGATIVE

## 2021-05-10 LAB — GLUCOSE, CAPILLARY: Glucose-Capillary: 385 mg/dL — ABNORMAL HIGH (ref 70–99)

## 2021-05-10 LAB — SARS CORONAVIRUS 2 (TAT 6-24 HRS): SARS Coronavirus 2: NEGATIVE

## 2021-05-10 LAB — BRAIN NATRIURETIC PEPTIDE: B Natriuretic Peptide: 47.1 pg/mL (ref 0.0–100.0)

## 2021-05-10 NOTE — Progress Notes (Signed)
PCP - Sherley Bounds Cardiologist - Dr. Ida Rogue  PPM/ICD - denies Device Orders -  Rep Notified -   Chest x-ray - 05/10/21 EKG - 05/01/21 Stress Test -  ECHO - 03/15/21 Cardiac Cath - 02/15/21  Sleep Study -  CPAP -   Fasting Blood Sugar - 140 Checks Blood Sugar one times a day  Blood Thinner Instructions:n/a Aspirin Instructions:per Dr. Antionette Char office  ERAS Protcol -no PRE-SURGERY Ensure or G2- no  COVID TEST- 05/10/21   Anesthesia review: yes,cardiac history  Patient denies shortness of breath, fever, cough and chest pain at PAT appointment   All instructions explained to the patient, with a verbal understanding of the material. Patient agrees to go over the instructions while at home for a better understanding. Patient also instructed to wear a mask after being tested for COVID-19. The opportunity to ask questions was provided.

## 2021-05-13 MED ORDER — NOREPINEPHRINE 4 MG/250ML-% IV SOLN
0.0000 ug/min | INTRAVENOUS | Status: AC
  Administered 2021-05-14: 2 ug/min via INTRAVENOUS
  Filled 2021-05-13: qty 250

## 2021-05-13 MED ORDER — MAGNESIUM SULFATE 50 % IJ SOLN
40.0000 meq | INTRAMUSCULAR | Status: DC
  Filled 2021-05-13: qty 9.85

## 2021-05-13 MED ORDER — HEPARIN 30,000 UNITS/1000 ML (OHS) CELLSAVER SOLUTION
Status: DC
Start: 2021-05-14 — End: 2021-05-14
  Filled 2021-05-13: qty 1000

## 2021-05-13 MED ORDER — DEXMEDETOMIDINE HCL IN NACL 400 MCG/100ML IV SOLN
0.1000 ug/kg/h | INTRAVENOUS | Status: AC
  Administered 2021-05-14: 132 ug via INTRAVENOUS
  Administered 2021-05-14: 1 ug/kg/h via INTRAVENOUS
  Filled 2021-05-13 (×2): qty 100

## 2021-05-13 MED ORDER — CEFAZOLIN IN SODIUM CHLORIDE 3-0.9 GM/100ML-% IV SOLN
3.0000 g | INTRAVENOUS | Status: AC
  Administered 2021-05-14: 3 g via INTRAVENOUS
  Filled 2021-05-13 (×2): qty 100

## 2021-05-13 MED ORDER — POTASSIUM CHLORIDE 2 MEQ/ML IV SOLN
80.0000 meq | INTRAVENOUS | Status: DC
Start: 2021-05-14 — End: 2021-05-14
  Filled 2021-05-13: qty 40

## 2021-05-13 NOTE — H&P (Signed)
[address]       [place] [address]             [phone]      Cardiothoracic Surgery Admission History and Physical   PCP is Steele Sizer, MD Referring Provider is Sherren Mocha, MD Primary Cardiologist is Esmond Plants, MD   Reason for admission:  Severe aortic stenosis   HPI:   The patient is a 81 year old gentleman with history of hypertension, hyperlipidemia, type 2 diabetes, gout, stage II chronic kidney disease, morbid obesity and aortic stenosis that has been followed with echocardiogram.  A 2D echocardiogram in January 2022 showed an indeterminate number of cusps.  There was moderate calcification and severe thickening with restricted mobility.  The mean gradient was 47 mmHg with a peak gradient of 63 mmHg consistent with severe stenosis.  Left ventricular ejection fraction was 50 to 55% with mild LVH and grade 1 diastolic dysfunction.  He reports that over the past year he has developed progressive exertional shortness of breath and fatigue.  He has had no chest pain or pressure.  He denies any dizziness or syncope.  He has had no orthopnea or PND.  Denies peripheral edema.  Cardiac catheterization on 02/15/2021 showed severe single-vessel coronary disease with chronic total occlusion of a small caliber second marginal branch.  Otherwise there was mild to moderate nonobstructive coronary disease.  There were mildly elevated left heart and pulmonary pressures with a wedge of 22 and a mean PA pressure of 26 mmHg.  His most recent echocardiogram on 03/15/2021 showed a mean gradient across aortic valve of 35 mmHg with a peak of 54.5 mmHg.  The valve appeared functionally bicuspid with moderate calcification and thickening and restricted mobility.  Left ventricular ejection fraction was 50 to 55%.   The patient is widowed.  He ambulates with a cane due to leg weakness and knee problems.           Past Medical History:  Diagnosis Date   Aortic stenosis      Chronic kidney disease, stage II (mild)     Decreased libido     Diabetes mellitus without complication ([facility])     Glaucoma     Gout 2009   Hernia [id number]   Hyperlipidemia     Hypertension     Inguinal hernia without mention of obstruction or gangrene, unilateral or unspecified, (not specified as recurrent)     Lumbago     Obesity     Unspecified constipation             Past Surgical History:  Procedure Laterality Date   cataract surgery       COLONOSCOPY   2012   COLONOSCOPY N/A 04/22/2019    Procedure: COLONOSCOPY;  Surgeon: Mansouraty, Telford Nab., MD;  Location: [facility];  Service: Gastroenterology;  Laterality: N/A;   EYE SURGERY   2011    cataract   RIGHT HEART CATH AND CORONARY ANGIOGRAPHY Bilateral 02/15/2021    Procedure: RIGHT HEART CATH AND CORONARY ANGIOGRAPHY;  Surgeon: Nelva Bush, MD;  Location: [facility];  Service: Cardiovascular;  Laterality: Bilateral;   UMBILICAL HERNIA REPAIR   2011           Family History  Problem Relation Age of Onset   Heart disease Father     Hypertension Father     Diabetes Sister     Diabetes Brother     Prostate cancer Brother  Diabetes Sister     Kidney disease Neg Hx     Kidney cancer Neg Hx     Bladder Cancer Neg Hx     Colon cancer Neg Hx     Esophageal cancer Neg Hx     Inflammatory bowel disease Neg Hx     Liver disease Neg Hx     Pancreatic cancer Neg Hx     Rectal cancer Neg Hx     Stomach cancer Neg Hx        Social History         Socioeconomic History   Marital status: Widowed      Spouse name: Not on file   Number of children: 2   Years of education: Not on file   Highest education level: Some college, no degree  Occupational History   Occupation: retired       Comment: used to [employer]   Tobacco Use   Smoking status: Former      Packs/day: 1.00      Years: 10.00      Pack years: 10.00      Types: Cigarettes      Start date: 03/17/1965      Quit date:  11/27/1975      Years since quitting: 45.4   Smokeless tobacco: Never   Tobacco comments:      quit 40 years  Vaping Use   Vaping Use: Never used  Substance and Sexual Activity   Alcohol use: Yes      Alcohol/week: 1.0 standard drink      Types: 1 Standard drinks or equivalent per week      Comment: socially - 1 x year   Drug use: No   Sexual activity: Yes      Partners: Female  Other Topics Concern   Not on file  Social History Narrative    Lives by himself in [place]    Two grown children, daughter in [place] and son in [place]    He goes to [place] to visit his sisters often     Social Determinants of Scientist, research (life sciences) Strain: Low Risk    Difficulty of Paying Living Expenses: Not hard at all  Food Insecurity: No Food Insecurity   Worried About Charity fundraiser in the Last Year: Never true   Arboriculturist in the Last Year: Never true  Transportation Needs: No Transportation Needs   Lack of Transportation (Medical): No   Lack of Transportation (Non-Medical): No  Physical Activity: Sufficiently Active   Days of Exercise per Week: 5 days   Minutes of Exercise per Session: 30 min  Stress: No Stress Concern Present   Feeling of Stress : Not at all  Social Connections: Moderately Integrated   Frequency of Communication with Friends and Family: More than three times a week   Frequency of Social Gatherings with Friends and Family: More than three times a week   Attends Religious Services: More than 4 times per year   Active Member of [employer] or Organizations: Yes   Attends Archivist Meetings: More than 4 times per year   Marital Status: Widowed  Intimate Partner Violence: Not At Risk   Fear of Current or Ex-Partner: No   Emotionally Abused: No   Physically Abused: No   Sexually Abused: No             Prior to Admission medications   Medication Sig Start  Date End Date Taking? Authorizing Provider  ACCU-CHEK AVIVA PLUS test strip TEST TWO  TIMES DAILY AS NEEDED AS DIRECTED 09/10/20   Yes Sowles, Drue Stager, MD  Accu-Chek FastClix Lancets MISC 1 each by Subdermal route 2 (two) times daily. 11/13/20   Yes Sowles, Drue Stager, MD  allopurinol (ZYLOPRIM) 300 MG tablet TAKE 1 TABLET EVERY DAY 11/13/20   Yes Sowles, Drue Stager, MD  amLODipine (NORVASC) 10 MG tablet TAKE 1 TABLET EVERY DAY 11/13/20   Yes Steele Sizer, MD  aspirin EC 81 MG tablet Take 1 tablet (81 mg total) by mouth daily. Swallow whole. Patient taking differently: Take 81 mg by mouth at bedtime. Swallow whole. 09/23/19   Yes Loel Dubonnet, NP  atorvastatin (LIPITOR) 40 MG tablet TAKE 1 TABLET EVERY DAY Patient taking differently: Take 40 mg by mouth at bedtime. 11/13/20   Yes Sowles, Drue Stager, MD  Blood Glucose Monitoring Suppl (ACCU-CHEK AVIVA PLUS) w/Device KIT 1 each by Does not apply route 2 (two) times daily. Use as directed 04/01/19   Yes Sowles, Drue Stager, MD  Cholecalciferol (VITAMIN D3 PO) Take 1 tablet by mouth every Monday, Wednesday, and Friday.     Yes [provider]  furosemide (LASIX) 20 MG tablet Take 1 tablet (20 mg total) by mouth daily. 02/15/21 02/15/22 Yes End, Harrell Gave, MD  glipiZIDE (GLUCOTROL XL) 5 MG 24 hr tablet TAKE 1 TABLET (5 MG TOTAL) BY MOUTH DAILY WITH BREAKFAST. 11/13/20   Yes Sowles, Drue Stager, MD  latanoprost (XALATAN) 0.005 % ophthalmic solution Place 1 drop into the left eye at bedtime.  06/18/15   Yes [provider]  metFORMIN (GLUCOPHAGE-XR) 750 MG 24 hr tablet Take 2 tablets (1,500 mg total) by mouth daily with breakfast. 02/18/21   Yes End, Harrell Gave, MD  metoprolol tartrate (LOPRESSOR) 100 MG tablet Take as directed prior to 1/23 CT Scan 03/26/21   Yes Sherren Mocha, MD  Omega-3 Fatty Acids (FISH OIL PO) Take 1 capsule by mouth 2 (two) times daily.      Yes [provider]  polyethylene glycol (MIRALAX / GLYCOLAX) packet Take 17 g by mouth at bedtime.      Yes [provider]  tadalafil (CIALIS) 10 MG tablet  Take 1-2 tablets (10-20 mg total) by mouth every three (3) days as needed for erectile dysfunction. 04/24/20   Yes Sowles, Drue Stager, MD  tamsulosin (FLOMAX) 0.4 MG CAPS capsule TAKE 1 CAPSULE EVERY DAY 11/13/20   Yes Steele Sizer, MD            Current Outpatient Medications  Medication Sig Dispense Refill   ACCU-CHEK AVIVA PLUS test strip TEST TWO TIMES DAILY AS NEEDED AS DIRECTED 200 strip 2   Accu-Chek FastClix Lancets MISC 1 each by Subdermal route 2 (two) times daily. 102 each 2   allopurinol (ZYLOPRIM) 300 MG tablet TAKE 1 TABLET EVERY DAY 90 tablet 1   amLODipine (NORVASC) 10 MG tablet TAKE 1 TABLET EVERY DAY 90 tablet 1   aspirin EC 81 MG tablet Take 1 tablet (81 mg total) by mouth daily. Swallow whole. (Patient taking differently: Take 81 mg by mouth at bedtime. Swallow whole.)       atorvastatin (LIPITOR) 40 MG tablet TAKE 1 TABLET EVERY DAY (Patient taking differently: Take 40 mg by mouth at bedtime.) 90 tablet 1   Blood Glucose Monitoring Suppl (ACCU-CHEK AVIVA PLUS) w/Device KIT 1 each by Does not apply route 2 (two) times daily. Use as directed 100 kit 12   Cholecalciferol (VITAMIN D3 PO)  Take 1 tablet by mouth every Monday, Wednesday, and Friday.       furosemide (LASIX) 20 MG tablet Take 1 tablet (20 mg total) by mouth daily. 30 tablet 5   glipiZIDE (GLUCOTROL XL) 5 MG 24 hr tablet TAKE 1 TABLET (5 MG TOTAL) BY MOUTH DAILY WITH BREAKFAST. 90 tablet 1   latanoprost (XALATAN) 0.005 % ophthalmic solution Place 1 drop into the left eye at bedtime.        metFORMIN (GLUCOPHAGE-XR) 750 MG 24 hr tablet Take 2 tablets (1,500 mg total) by mouth daily with breakfast. 180 tablet 1   metoprolol tartrate (LOPRESSOR) 100 MG tablet Take as directed prior to 1/23 CT Scan 1 tablet 0   Omega-3 Fatty Acids (FISH OIL PO) Take 1 capsule by mouth 2 (two) times daily.        polyethylene glycol (MIRALAX / GLYCOLAX) packet Take 17 g by mouth at bedtime.        tadalafil (CIALIS) 10 MG tablet Take 1-2  tablets (10-20 mg total) by mouth every three (3) days as needed for erectile dysfunction. 30 tablet 0   tamsulosin (FLOMAX) 0.4 MG CAPS capsule TAKE 1 CAPSULE EVERY DAY 90 capsule 1    No current facility-administered medications for this visit.           Allergies  Allergen Reactions   Ace Inhibitors Other (See Comments)      angioedema          Review of Systems:               General:                      normal appetite, + decreased energy, no weight gain, no weight loss, no fever             Cardiac:                       no chest pain with exertion, no chest pain at rest, + SOB with mild exertion, no resting SOB, no PND, no orthopnea, no palpitations, no arrhythmia, no atrial fibrillation, no LE edema, no dizzy spells, no syncope             Respiratory:                 + exertional shortness of breath, no home oxygen, no productive cough, no dry cough, no bronchitis, no wheezing, no hemoptysis, no asthma, no pain with inspiration or cough, no sleep apnea, no CPAP at night             GI:                               no difficulty swallowing, no reflux, no frequent heartburn, no hiatal hernia, no abdominal pain, no constipation, no diarrhea, no hematochezia, no hematemesis, no melena             GU:                              no dysuria,  + frequency, no urinary tract infection, no hematuria, no enlarged prostate, no kidney stones, + kidney disease             Vascular:  no pain suggestive of claudication, no pain in feet, no leg cramps, no varicose veins, no DVT, no non-healing foot ulcer             Neuro:                         no stroke, no TIA's, no seizures, no headaches, no temporary blindness one eye,  no slurred speech, no peripheral neuropathy, no chronic pain, + instability of gait, no memory/cognitive dysfunction             Musculoskeletal:         +  arthritis - primarily involving the knees, no joint swelling, no myalgias, + difficulty walking, +  decreased mobility              Skin:                            no rash, no itching, no skin infections, no pressure sores or ulcerations             Psych:                         no anxiety, no depression, no nervousness, no unusual recent stress             Eyes:                           no blurry vision, no floaters, no recent vision changes, + wears glasses             ENT:                            no hearing loss, no loose or painful teeth, no dentures, last saw dentist last year             Hematologic:               no easy bruising, no abnormal bleeding, no clotting disorder, no frequent epistaxis             Endocrine:                   + diabetes, does  check CBG's at home                            Physical Exam:               BP (!) 146/83 (BP Location: Right Arm, Patient Position: Sitting)    Pulse 87    Resp 20    Ht _0  (1.753 m)    Wt 296 lb (134.3 kg)    SpO2 98% Comment: RA   BMI 43.71 kg/m              General:                      Elderly, well-appearing             HEENT:                       Unremarkable, NCAT, PERLA, EOMI             Neck:  no JVD, no bruits, no adenopathy              Chest:                          clear to auscultation, symmetrical breath sounds, no wheezes, no rhonchi              CV:                              RRR, 3/6 systolic murmur RSB, no diastolic murmur             Abdomen:                    soft, non-tender, no masses              Extremities:                 warm, well-perfused, pulses palpable in ankles, no lower extremity edema             Rectal/GU                   Deferred             Neuro:                         Grossly non-focal and symmetrical throughout             Skin:                            Clean and dry, no rashes, no breakdown   Diagnostic Tests:   ECHOCARDIOGRAM REPORT         Patient Name:   [name] Date of Exam: 03/15/2021  Medical Rec #:  [id number]         Height:        69.0 in  Accession #:    [id number]        Weight:       297.8 lb  Date of Birth:  Aug 08, 1940         BSA:          2.447 m  Patient Age:    80 years          BP:           118/74 mmHg  Patient Gender: M                 HR:           84 bpm.  Exam Location:  [address]   Procedure: 2D Echo, Cardiac Doppler and Color Doppler   Indications:    I35.9 Aortic stenosis     History:        Patient has prior history of Echocardiogram examinations.  PAD,                  Aortic Valve Disease; Risk Factors:Diabetes, Dyslipidemia  and                  Morbid obesity. Previous echo revealed LVEF 55% sevre AS  with                  mean of 47.     Sonographer:    Lenard Galloway BA, RDCS  Referring Phys:  [place] Left ventricular ejection fraction, by estimation, is 50 to 55%. Left  ventricular ejection fraction by PLAX is 63 %. The left ventricle has low  normal function. The left ventricle has no regional wall motion  abnormalities. Left ventricular diastolic  parameters are consistent with Grade I diastolic dysfunction (impaired  relaxation). Elevated left ventricular end-diastolic pressure.   2. Right ventricular systolic function is normal. The right ventricular  size is normal.   3. The mitral valve is normal in structure. No evidence of mitral valve  regurgitation. No evidence of mitral stenosis.   4. 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 The aortic valve is  calcified. There is moderate calcification of the aortic valve. There is  moderate thickening of the aortic valve. Aortic valve regurgitation is not  visualized. Moderate aortic valve  stenosis. Aortic valve area, by VTI measures 1.37 cm. Aortic valve mean  gradient measures 35.0 mmHg. Aortic valve Vmax measures 3.69 m/s.   5. Aortic dilatation noted. There is mild dilatation of the aortic root,  measuring 39 mm. There is mild dilatation of the ascending aorta,  measuring 41 mm.    6. The inferior vena cava is normal in size with greater than 50%  respiratory variability, suggesting right atrial pressure of 3 mmHg.   FINDINGS   Left Ventricle: Left ventricular ejection fraction, by estimation, is 50  to 55%. Left ventricular ejection fraction by PLAX is 63 %. The left  ventricle has low normal function. The left ventricle has no regional wall  motion abnormalities. The left  ventricular internal cavity size was normal in size. There is no left  ventricular hypertrophy. Left ventricular diastolic parameters are  consistent with Grade I diastolic dysfunction (impaired relaxation).  Elevated left ventricular end-diastolic  pressure.   Right Ventricle: The right ventricular size is normal. No increase in  right ventricular wall thickness. Right ventricular systolic function is  normal.   Left Atrium: Left atrial size was normal in size.   Right Atrium: Right atrial size was normal in size.   Pericardium: There is no evidence of pericardial effusion.   Mitral Valve: The mitral valve is normal in structure. No evidence of  mitral valve regurgitation. No evidence of mitral valve stenosis.   Tricuspid Valve: The tricuspid valve is normal in structure. Tricuspid  valve regurgitation is trivial. No evidence of tricuspid stenosis.   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 The aortic valve  is calcified. There is moderate calcification of the aortic valve. There  is moderate thickening of the aortic valve. Aortic valve regurgitation is  not visualized. Moderate aortic  stenosis is present. Aortic valve mean gradient measures 35.0 mmHg. Aortic  valve peak gradient measures 54.5 mmHg. Aortic valve area, by VTI measures  1.37 cm.   Pulmonic Valve: The pulmonic valve was normal in structure. Pulmonic valve  regurgitation is not visualized. No evidence of pulmonic stenosis.   Aorta: Aortic dilatation noted. There is mild dilatation of the aortic   root, measuring 39 mm. There is mild dilatation of the ascending aorta,  measuring 41 mm.   Venous: The inferior vena cava is normal in size with greater than 50%  respiratory variability, suggesting right atrial pressure of 3 mmHg.   IAS/Shunts: No atrial level shunt detected by color flow Doppler.      LEFT VENTRICLE  PLAX 2D  LV EF:         Left  Diastology                 ventricular     LV e' medial:    3.81 cm/s                 ejection        LV E/e' medial:  16.4                 fraction by     LV e' lateral:   6.42 cm/s                 PLAX is 63      LV E/e' lateral: 9.8                 %.  LVIDd:         4.40 cm  LVIDs:         2.90 cm  LV PW:         1.40 cm  LV IVS:        1.40 cm  LVOT diam:     2.70 cm  LV SV:         104  LV SV Index:   43  LVOT Area:     5.73 cm      RIGHT VENTRICLE  RV Basal diam:  3.40 cm  RV Mid diam:    4.00 cm  RV S prime:     13.50 cm/s  TAPSE (M-mode): 2.1 cm   LEFT ATRIUM             Index        RIGHT ATRIUM           Index  LA diam:        4.70 cm 1.92 cm/m   RA Area:     15.10 cm  LA Vol (A2C):   68.0 ml 27.79 ml/m  RA Volume:   33.00 ml  13.49 ml/m  LA Vol (A4C):   33.9 ml 13.85 ml/m  LA Biplane Vol: 51.3 ml 20.97 ml/m   AORTIC VALVE  AV Area (Vmax):    1.44 cm  AV Area (Vmean):   1.39 cm  AV Area (VTI):     1.37 cm  AV Vmax:           369.00 cm/s  AV Vmean:          273.000 cm/s  AV VTI:            0.762 m  AV Peak Grad:      54.5 mmHg  AV Mean Grad:      35.0 mmHg  LVOT Vmax:         92.50 cm/s  LVOT Vmean:        66.400 cm/s  LVOT VTI:          0.182 m  LVOT/AV VTI ratio: 0.24     AORTA  Ao Root diam: 3.90 cm  Ao Asc diam:  4.10 cm   MITRAL VALVE                TRICUSPID VALVE  MV Area (PHT): 3.93 cm     TR Peak grad:   18.7 mmHg  MV Decel Time: 193 msec     TR Vmax:        216.00 cm/s  MV E velocity: 62.60 cm/s  MV A velocity: 107.00 cm/s  SHUNTS  MV E/A ratio:  0.59  Systemic  VTI:  0.18 m                              Systemic Diam: 2.70 cm   Skeet Latch MD  Electronically signed by Skeet Latch MD  Signature Date/Time: 03/15/2021/6:50:02 PM         Final       Physicians Panel Physicians Referring Physician Case Authorizing Physician  End, Harrell Gave, MD (Primary)      Procedures RIGHT HEART CATH AND CORONARY ANGIOGRAPHY  Conclusion Conclusions:  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 Otherwise, there is mild-moderate, non-obstructive coronary artery disease, as detailed below. Mildly elevated left heart and pulmonary artery pressures (PCWP 22 mmHg, mean PAP 26 mmHg). Severely elevated right heart filling pressure (mean RAP 16 mmHg, RVEDP 16 mmHg). Normal Fick cardiac output/index (CO 6.1 L/min, CI 2.5 L/min/m^2). Recommendations:  Medical therapy of coronary artery disease, including aggressive secondary prevention. Consider gentle diuresis; will start furosemide 20 mg PO daily at discharge. Ongoing workup of severe aortic stenosis per Dr. Rockey Situ. Consider referral to structural heart clinic for consideration of TAVR. Nelva Bush, MD  [facility]  Recommendations Antiplatelet/Anticoag Recommend Aspirin 90m daily for moderate CAD.    Discharge Date In the absence of any other complications or medical issues, we expect the patient to be ready for discharge from a cath perspective on 02/15/2021.  Indications Severe aortic valve stenosis [I35.0 (ICD-10-CM)]  Procedural Details Technical Details Indication: 81y.o. year-old man with history of severe aortic stenosis (mean gradient 47 mmHg by echo), hypertension, hyperlipidemia, type 2 diabetes mellitus, and chronic kidney disease stage II, presenting for evaluation of exertional dyspnea in the setting of severe aortic stenosis.  Heart Failure (start of procedure): NYHA class II  GFR: >60 ml/min  Procedure: The risks,  benefits, complications, treatment options, and expected outcomes were discussed with the patient. The patient and/or family concurred with the proposed plan, giving informed consent. The right wrist and elbow were prepped and draped in a sterile fashion. 1% lidocaine was used for local anesthesia. A previously placed antecubital vein IV was exchanged for a 21F slender Glidesheath using modified Seldinger technique. Right heart catheterization was performed by advancing a 21F balloon-tipped catheter through the right heart chambers into the pulmonary capillary wedge position. Pressure measurements and oxygen saturations were obtained.  Using the modified Seldinger access technique, a 29F slender Glidesheath was placed in the right radial artery. 3 mg verapamil was given through the sheath. Heparin 5,000 units were administered. Selective coronary angiography was performed using a 21F Jacky catheter to engage the left coronary artery and a 21F Jacky catheter to engage the right coronary artery. Left heart catheterization was not performed.  At the end of the procedure, the radial artery sheath was removed and a TR band applied to achieve patent hemostasis. The antecubital vein sheath was removed and hemostasis achieved with manual compression. There were no immediate complications. The patient was taken to the recovery area in stable condition.   Estimated blood loss <50 mL.   During this procedure medications were administered to achieve and maintain moderate conscious sedation while the patient's heart rate, blood pressure, and oxygen saturation were continuously monitored and I was present face-to-face 100% of this time.  Medications (Filter: Administrations occurring from 1431 to 1514 on 02/15/21)  fentaNYL (SUBLIMAZE) injection (mcg) Total dose: 12.5 mcg  Date/Time Rate/Dose/Volume Action  02/15/21 1437 12.5 mcg Given    midazolam (VERSED) injection (mg) Total dose: 0.5 mg  Date/Time  Rate/Dose/Volume Action     02/15/21 1437 0.5 mg Given    lidocaine (PF) (XYLOCAINE) 1 % injection (mL) Total volume: 4 mL  Date/Time Rate/Dose/Volume Action     02/15/21 1442 2 mL Given    1450 2 mL Given    verapamil (ISOPTIN) injection (mg) Total dose: 2.5 mg  Date/Time Rate/Dose/Volume Action     02/15/21 1451 2.5 mg Given    heparin sodium (porcine) injection (Units) Total dose: 5,000 Units  Date/Time Rate/Dose/Volume Action     02/15/21 1453 5,000 Units Given    iohexol (OMNIPAQUE) 350 MG/ML injection (mL) Total volume: 30 mL  Date/Time Rate/Dose/Volume Action     02/15/21 1507 30 mL Given    Heparin (Porcine) in NaCl 1000-0.9 UT/500ML-% SOLN (mL) Total volume: 1,000 mL  Date/Time Rate/Dose/Volume Action     02/15/21 1508 500 mL Given    1508 500 mL Given    Sedation Time Sedation Time Physician-1: 23 minutes 5 seconds  Contrast Medication Name Total Dose  iohexol (OMNIPAQUE) 350 MG/ML injection 30 mL  Radiation/Fluoro Fluoro time: 3 (min)  DAP: 27.4 (Gycm2)  Cumulative Air Kerma: 528 (mGy)  Complications Complications documented before study signed (02/15/2021 4:13 PM)  No complications were associated with this study.  Documented by Jeanella Craze, RT - 02/15/2021 3:12 PM  Coronary Findings Diagnostic Dominance: Right  Left Main  Vessel is large. Vessel is angiographically normal.  Left Anterior Descending  Vessel is large.  Dist LAD lesion is 20% stenosed.  First Diagonal Branch  Vessel is large in size.  Left Circumflex  Vessel is large.  Mid Cx lesion is 25% stenosed with 100% stenosed side branch in 2nd Mrg.  First Obtuse Marginal Branch  Vessel is large in size.  Second Obtuse Marginal Branch  Vessel is small in size.  Collaterals  2nd Mrg filled by collaterals from 1st Mrg.     Collaterals  2nd Mrg filled by collaterals from [place].     Third Obtuse Marginal Branch  Vessel is small in size.  Fourth Obtuse Marginal Branch  Vessel is small  in size.  Right Coronary Artery  Vessel is moderate in size.  Ost RCA to Prox RCA lesion is 20% stenosed.  Mid RCA lesion is 60% stenosed.  Right Posterior Descending Artery  Vessel is moderate in size.  Right Posterior Atrioventricular Artery  Vessel is small in size.  First Right Posterolateral Branch  Vessel is small in size.  Second Right Posterolateral Branch  Vessel is small in size.  Third Right Posterolateral Branch  Vessel is small in size.  Intervention  No interventions have been documented.  Right Heart Right Heart Pressures RA (mean): 15 mmHg RV (S/EDP): 39/16 mmHg PA (S/D, mean): 38/20 (26) mmHg PCWP (mean): 22 mmHg  Ao sat: 99% PA sat: 65%  Fick CO: 6.1 L/min Fick CI: 2.5 L/min/m^2  Left Heart Aortic Valve The aortic valve is calcified.  Coronary Diagrams Diagnostic Dominance: Right  &&&&&&&&  Intervention Implants  No implant documentation for this case.    Syngo Images Show images for CARDIAC CATHETERIZATION  Images on Long Term Storage Show images for Stephone, Gum to Procedure Log   Procedure Log  Hemo Data (last day) before discharge AO Systolic Cath Pressure AO Diastolic Cath Pressure AO Mean Cath Pressure PA Systolic Cath Pressure PA Diastolic Cath Pressure PA Mean Cath Pressure RA  Wedge A Wave RA Wedge V Wave RV Systolic Cath Pressure RV Diastolic Cath Pressure RV End Diastolic RV Systolic RV End Diastolic RV dP/dt PCW A Wave PCW V Wave PCW Mean AO O2 Sat PA O2 Sat AO O2 Sat Fick C.O. Fick C.I.  -- -- -- -- -- -- 11 mmHg 10 mmHg -- -- -- 31 mmHg 11 mmHg 288 mmHg/sec 15 mmHg 16 mmHg 11 mmHg -- -- -- 6.14 L/min 2.51 L/min/m2  -- -- -- -- -- -- -- -- 31 mmHg 6 mmHg 11 mmHg -- -- -- -- -- -- -- -- -- -- --  -- -- -- 28 mmHg 11 mmHg 19 mmHg -- -- -- -- -- -- -- -- -- -- -- -- -- -- -- --  119 63 mmHg 76 mmHg -- -- -- -- -- -- -- -- -- -- -- -- -- -- -- -- -- -- --  -- -- -- -- -- -- -- -- -- -- -- -- -- -- -- -- -- 99.1 % -- SA -- --  --  -- -- -- -- -- -- -- -- -- -- --                          ADDENDUM REPORT: 04/08/2021 17:48   CLINICAL DATA:  Severe Aortic Stenosis.   EXAM: Cardiac TAVR CT   TECHNIQUE: A non-contrast, gated CT scan was obtained with axial slices of 3 mm through the heart for aortic valve calcium scoring. A 120 kV retrospective, gated, contrast cardiac scan was obtained. Gantry rotation speed was 250 msecs and collimation was 0.6 mm. Nitroglycerin was not given. The 3D data set was reconstructed in 5% intervals of the 0-95% of the R-R cycle. Systolic and diastolic phases were analyzed on a dedicated workstation using MPR, MIP, and VRT modes. The patient received 100 cc of contrast.   FINDINGS: Image quality: Excellent.   Noise artifact is: Limited.   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 Restricted movement in systole consistent with severe AS.   Aortic Valve Calcium score: 2733   Aortic annular dimension:   Phase assessed: 30%   Annular area: 574 mm2   Annular perimeter: 87.1 mm   Max diameter: 30.6 mm   Min diameter: 25.3 mm   Annular and subannular calcification: None.   Membranous septum length: 6.9 mm   Optimal coplanar projection: LAO 10 CRA 16   Coronary Artery Height above Annulus:   Left Main: 20.9 mm (high take off above LCC)   Right Coronary: 17.1 mm   Sinus of Valsalva Measurements:   Non-coronary: 36.2 mm   Right-coronary: 35.5 mm   Left-coronary: 36.9 mm   Sinus of Valsalva Height:   Non-coronary: 19.3 mm   Right-coronary: 21.8 mm   Left-coronary: 21.8 mm   Sinotubular Junction: 35 mm   Ascending Thoracic Aorta: 38 mm   Coronary Arteries: Normal coronary origin. Right dominance. The study was performed without use of NTG and is insufficient for plaque evaluation. Please refer to recent cardiac catheterization for coronary assessment. Severe 3-vessel calcifications.   Cardiac Morphology:   Right  Atrium: Right atrial size is within normal limits.   Right Ventricle: The right ventricular cavity is within normal limits.   Left Atrium: Left atrial size is normal in size with no left atrial appendage filling defect.   Left Ventricle: The ventricular cavity size is within normal limits. There are no stigmata of prior infarction. There  is no abnormal filling defect. Normal left ventricular function, LVEF=54%. No regional wall motion abnormalities.   Pulmonary arteries: Normal in size without proximal filling defect.   Pulmonary veins: Normal pulmonary venous drainage.   Pericardium: Normal thickness with no significant effusion or calcium present.   Mitral Valve: The mitral valve is normal structure without significant calcification.   Extra-cardiac findings: See attached radiology report for non-cardiac structures.   IMPRESSION: 1. Tricuspid aortic valve with severe calcifications.   2. Annular measurements appropriate for 29 mm S3 (574 mm2).   3. No significant annular or subannular calcifications.   4. Sufficient coronary to annulus distance.   5. Optimal Fluoroscopic Angle for Delivery: LAO 10 CRA Palmer T. Audie Box, MD     Electronically Signed   By: Eleonore Chiquito M.D.   On: 04/08/2021 17:48    Addended by Geralynn Rile, MD on 04/08/2021  5:51 PM    Study Result   Narrative & Impression  EXAM: OVER-READ INTERPRETATION  CT CHEST   The following report is an over-read performed by radiologist Dr. Vinnie Langton of [facility], [place] on 04/08/2021. This over-read does not include interpretation of cardiac or coronary anatomy or pathology. The coronary calcium score/coronary CTA interpretation by the cardiologist is attached.   COMPARISON:  Chest CTA 09/21/2018.   FINDINGS: Extracardiac findings will be described separately under dictation for contemporaneously obtained CTA chest, abdomen and pelvis.   IMPRESSION: Please see  separate dictation for contemporaneously obtained CTA chest, abdomen and pelvis dated 04/08/2021 for full description of relevant extracardiac findings.   Electronically Signed: By: Vinnie Langton M.D. On: 04/08/2021 11:49        Narrative & Impression  CLINICAL DATA:  81 year old male with history of severe aortic stenosis. Preprocedural study prior to potential transcatheter aortic valve replacement (TAVR) procedure.   EXAM: CT ANGIOGRAPHY CHEST, ABDOMEN AND PELVIS   TECHNIQUE: Multidetector CT imaging through the chest, abdomen and pelvis was performed using the standard protocol during bolus administration of intravenous contrast. Multiplanar reconstructed images and MIPs were obtained and reviewed to evaluate the vascular anatomy.   RADIATION DOSE REDUCTION: This exam was performed according to the departmental dose-optimization program which includes automated exposure control, adjustment of the mA and/or kV according to patient size and/or use of iterative reconstruction technique.   CONTRAST:  11m OMNIPAQUE IOHEXOL 350 MG/ML SOLN   COMPARISON:  Chest CTA 09/21/2018. CT the abdomen and pelvis 04/20/2019.   FINDINGS: CTA CHEST FINDINGS   Cardiovascular: Heart size is normal. There is no significant pericardial fluid, thickening or pericardial calcification. There is aortic atherosclerosis, as well as atherosclerosis of the great vessels of the mediastinum and the coronary arteries, including calcified atherosclerotic plaque in the left main, left anterior descending, left circumflex and right coronary arteries. Thickening calcification of the aortic valve.   Mediastinum/Lymph Nodes: No pathologically enlarged mediastinal or hilar lymph nodes. Esophagus is unremarkable in appearance. No axillary lymphadenopathy.   Lungs/Pleura: No suspicious appearing pulmonary nodules or masses are noted. Small left-sided Bochdalek's hernia containing fat incidentally  noted. No acute consolidative airspace disease. No pleural effusions.   Musculoskeletal/Soft Tissues: There are no aggressive appearing lytic or blastic lesions noted in the visualized portions of the skeleton.   CTA ABDOMEN AND PELVIS FINDINGS   Hepatobiliary: No suspicious cystic or solid hepatic lesions. No intra or extrahepatic biliary ductal dilatation. Gallbladder is normal in appearance.   Pancreas: No pancreatic mass. No pancreatic ductal dilatation. No pancreatic or peripancreatic  fluid collections or inflammatory changes.   Spleen: Unremarkable.   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 In the anterior aspect of the interpolar region of the right renal moiety (axial image 134 of series 10 and coronal image 84 of series 6) there is a 3.2 x 2.6 x 2.7 cm heterogeneously enhancing lesion, highly concerning for primary neoplasm such as a renal cell carcinoma. At this time, this lesion is encapsulated within Gerota's fascia and is separated from the right renal vein which is widely patent. Multiple other low-attenuation lesions scattered throughout both sides of the horseshoe kidney, largest of which are compatible with simple cysts, measuring up to 4.3 cm in diameter in the left kidney. Other subcentimeter low-attenuation lesions are too small to definitively characterize, but also favored to represent tiny cysts. No hydroureteronephrosis. Urinary bladder is normal in appearance. Left adrenal gland is normal in appearance. In the medial aspect of the right adrenal gland (axial image 109 of series 10) there is a 1.5 cm nodule which is incompletely characterized on today's examination, but similar to prior exams dating back to 2009, presumably a benign lesions such as an adenoma.   Stomach/Bowel: The appearance of the stomach is normal. There is no pathologic dilatation of small bowel or colon. Innumerable  colonic diverticulae are noted, without surrounding inflammatory changes to suggest an acute diverticulitis at this time. Normal appendix.   Vascular/Lymphatic: Aortic atherosclerosis, without evidence of aneurysm in the abdominal or pelvic vasculature. Vascular findings and measurements pertinent to potential TAVR procedure, as detailed below. No lymphadenopathy noted in the abdomen or pelvis.   Reproductive: Prostate gland and seminal vesicles are unremarkable in appearance.   Other: Postoperative changes of ventral hernia repair are again noted, with small chronic postoperative fluid collection in the anterior abdominal wall measuring 7.6 x 2.0 cm, similar to the prior study, presumably a chronic seroma. No significant volume of ascites. No pneumoperitoneum.   Musculoskeletal: There are no aggressive appearing lytic or blastic lesions noted in the visualized portions of the skeleton.   VASCULAR MEASUREMENTS PERTINENT TO TAVR:   AORTA:   Minimal Aortic Diameter-14 x 14 mm   Severity of Aortic Calcification-severe   RIGHT PELVIS:   Right Common Iliac Artery -   Minimal Diameter-10.9 x 8.6 mm   Tortuosity-mild   Calcification-severe   Right External Iliac Artery -   Minimal Diameter-8.6 x 7.0 mm   Tortuosity-mild-to-moderate   Calcification-mild   Right Common Femoral Artery -   Minimal Diameter-7.4 x 5.4 mm   Tortuosity-mild   Calcification-moderate   LEFT PELVIS:   Left Common Iliac Artery -   Minimal Diameter-8.8 x 9.5 mm   Tortuosity-mild   Calcification-severe   Left External Iliac Artery -   Minimal Diameter-8.8 x 6.8 mm   Tortuosity-mild   Calcification-mild   Left Common Femoral Artery -   Minimal Diameter-8.4 x 7.8 mm   Tortuosity-mild   Calcification-moderate   Review of the MIP images confirms the above findings.   IMPRESSION: 1. Vascular findings and measurements pertinent to potential TAVR procedure, as detailed  above. 2. Severe thickening and calcification of the aortic valve, compatible with reported clinical history of severe aortic stenosis. 3. Cross-fused renal ectopia (horseshoe kidney), normal anatomical variant, incidentally noted. However, today's study demonstrates a new heterogeneously enhancing mass in the right renal moiety which is highly suspicious for renal cell carcinoma. At this time, this is encapsulated within Gerota's fascia, is separate from the right renal vein which  is patent, and not associated with definite lymphadenopathy or signs of metastatic disease. Referral to Urology for further clinical evaluation and future management is strongly recommended in the near future. 4. Aortic atherosclerosis, in addition to left main and three-vessel coronary artery disease. 5. Severe colonic diverticulosis without evidence of acute diverticulitis at this time. 6. Small right adrenal nodule, stable compared to prior studies, compatible with a benign lesions such as a small adenoma. 7. Additional incidental findings, as above.     Electronically Signed   By: Vinnie Langton M.D.   On: 04/08/2021 12:22    Impression:   This 81 year old gentleman has stage D, severe, symptomatic aortic stenosis with New York Heart Association class III symptoms of exertional fatigue and shortness of breath consistent with chronic diastolic congestive heart failure.  I have personally reviewed his 2D echocardiogram, cardiac catheterization, and CTA studies.  His most recent echocardiogram shows a severely calcified and thickened aortic valve with restricted leaflet mobility.  The mean gradient was measured at 35 mmHg but an echo in January 2022 showed the gradient to be 47 mmHg.  I think this discrepancy is probably due to the poor windows on his most recent echo.  His aortic valve looks severely stenotic.  Left ventricular ejection fraction is normal.  Cardiac catheterization shows severe single-vessel  disease with a chronic total occlusion of a small caliber second marginal branch and otherwise mild to moderate nonobstructive disease.  I think that aortic valve replacement is indicated in this patient for relief of his symptoms and to prevent progressive left ventricular deterioration.  I agree that given his advanced age and comorbidities that transcatheter aortic valve replacement is the best treatment for replacing his aortic valve.  His gated cardiac CTA shows anatomy suitable for TAVR using a SAPIEN 3 valve.  His abdominal and pelvic CTA shows adequate pelvic vascular anatomy to allow transfemoral insertion.   The patient and his sister were counseled at length regarding treatment alternatives for management of severe symptomatic aortic stenosis. The risks and benefits of surgical intervention has been discussed in detail. Long-term prognosis with medical therapy was discussed. Alternative approaches such as conventional surgical aortic valve replacement, transcatheter aortic valve replacement, and palliative medical therapy were compared and contrasted at length. This discussion was placed in the context of the patient's own specific clinical presentation and past medical history. All of their questions have been addressed.    Following the decision to proceed with transcatheter aortic valve replacement, a discussion was held regarding what types of management strategies would be attempted intraoperatively in the event of life-threatening complications, including whether or not the patient would be considered a candidate for the use of cardiopulmonary bypass and/or conversion to open sternotomy for attempted surgical intervention.  I do not think he is a candidate for emergent sternotomy to manage any intraoperative complications given his age, morbid obesity, comorbidities and restricted mobility.  The patient is aware of the fact that transient use of cardiopulmonary bypass may be necessary. The  patient has been advised of a variety of complications that might develop including but not limited to risks of death, stroke, paravalvular leak, aortic dissection or other major vascular complications, aortic annulus rupture, device embolization, cardiac rupture or perforation, mitral regurgitation, acute myocardial infarction, arrhythmia, heart block or bradycardia requiring permanent pacemaker placement, congestive heart failure, respiratory failure, renal failure, pneumonia, infection, other late complications related to structural valve deterioration or migration, or other complications that might ultimately cause a temporary or permanent  loss of functional independence or other long term morbidity. The patient provides full informed consent for the procedure as described and all questions were answered.      Plan:   Transfemoral TAVR using a SAPIEN 3 valve.       Gaye Pollack, MD

## 2021-05-14 ENCOUNTER — Other Ambulatory Visit: Payer: Self-pay

## 2021-05-14 ENCOUNTER — Inpatient Hospital Stay (HOSPITAL_COMMUNITY): Payer: Medicare HMO | Admitting: Certified Registered Nurse Anesthetist

## 2021-05-14 ENCOUNTER — Encounter (HOSPITAL_COMMUNITY): Payer: Self-pay | Admitting: Cardiovascular Disease

## 2021-05-14 ENCOUNTER — Encounter (HOSPITAL_COMMUNITY)
Admission: RE | Disposition: A | Discharge status: Home / Self Care | Payer: Self-pay | Source: Home / Self Care | Attending: Cardiovascular Disease

## 2021-05-14 ENCOUNTER — Other Ambulatory Visit: Payer: Self-pay | Admitting: Physician Assistant

## 2021-05-14 ENCOUNTER — Inpatient Hospital Stay (HOSPITAL_COMMUNITY)
Admission: RE | Admit: 2021-05-14 | Discharge: 2021-05-15 | DRG: 267 | Disposition: A | Discharge status: Home / Self Care | Payer: Medicare HMO | Attending: Cardiovascular Disease | Admitting: Cardiovascular Disease

## 2021-05-14 ENCOUNTER — Inpatient Hospital Stay (HOSPITAL_COMMUNITY)
Admission: RE | Admit: 2021-05-14 | Discharge: 2021-05-14 | Disposition: A | Discharge status: Home / Self Care | Payer: Medicare HMO | Source: Ambulatory Visit | Attending: Physician Assistant | Admitting: Physician Assistant

## 2021-05-14 DIAGNOSIS — N521 Erectile dysfunction due to diseases classified elsewhere: Secondary | ICD-10-CM | POA: Diagnosis present

## 2021-05-14 DIAGNOSIS — I952 Hypotension due to drugs: Secondary | ICD-10-CM | POA: Diagnosis not present

## 2021-05-14 DIAGNOSIS — Z888 Allergy status to other drugs, medicaments and biological substances status: Secondary | ICD-10-CM

## 2021-05-14 DIAGNOSIS — I5032 Chronic diastolic (congestive) heart failure: Secondary | ICD-10-CM | POA: Diagnosis present

## 2021-05-14 DIAGNOSIS — N401 Enlarged prostate with lower urinary tract symptoms: Secondary | ICD-10-CM | POA: Diagnosis present

## 2021-05-14 DIAGNOSIS — I350 Nonrheumatic aortic (valve) stenosis: Secondary | ICD-10-CM | POA: Diagnosis not present

## 2021-05-14 DIAGNOSIS — J42 Unspecified chronic bronchitis: Secondary | ICD-10-CM | POA: Diagnosis not present

## 2021-05-14 DIAGNOSIS — E1151 Type 2 diabetes mellitus with diabetic peripheral angiopathy without gangrene: Secondary | ICD-10-CM

## 2021-05-14 DIAGNOSIS — I2582 Chronic total occlusion of coronary artery: Secondary | ICD-10-CM | POA: Diagnosis present

## 2021-05-14 DIAGNOSIS — Z8249 Family history of ischemic heart disease and other diseases of the circulatory system: Secondary | ICD-10-CM

## 2021-05-14 DIAGNOSIS — Y92234 Operating room of hospital as the place of occurrence of the external cause: Secondary | ICD-10-CM | POA: Diagnosis not present

## 2021-05-14 DIAGNOSIS — I440 Atrioventricular block, first degree: Secondary | ICD-10-CM | POA: Diagnosis present

## 2021-05-14 DIAGNOSIS — Z006 Encounter for examination for normal comparison and control in clinical research program: Secondary | ICD-10-CM | POA: Diagnosis not present

## 2021-05-14 DIAGNOSIS — I700 Atherosclerosis of aorta: Secondary | ICD-10-CM | POA: Diagnosis not present

## 2021-05-14 DIAGNOSIS — I77810 Thoracic aortic ectasia: Secondary | ICD-10-CM | POA: Diagnosis present

## 2021-05-14 DIAGNOSIS — I4510 Unspecified right bundle-branch block: Secondary | ICD-10-CM | POA: Diagnosis present

## 2021-05-14 DIAGNOSIS — I130 Hypertensive heart and chronic kidney disease with heart failure and stage 1 through stage 4 chronic kidney disease, or unspecified chronic kidney disease: Secondary | ICD-10-CM | POA: Diagnosis not present

## 2021-05-14 DIAGNOSIS — Z952 Presence of prosthetic heart valve: Secondary | ICD-10-CM

## 2021-05-14 DIAGNOSIS — H409 Unspecified glaucoma: Secondary | ICD-10-CM | POA: Diagnosis present

## 2021-05-14 DIAGNOSIS — I2510 Atherosclerotic heart disease of native coronary artery without angina pectoris: Secondary | ICD-10-CM | POA: Diagnosis present

## 2021-05-14 DIAGNOSIS — E1140 Type 2 diabetes mellitus with diabetic neuropathy, unspecified: Secondary | ICD-10-CM | POA: Diagnosis not present

## 2021-05-14 DIAGNOSIS — I10 Essential (primary) hypertension: Secondary | ICD-10-CM | POA: Diagnosis present

## 2021-05-14 DIAGNOSIS — N138 Other obstructive and reflux uropathy: Secondary | ICD-10-CM | POA: Diagnosis present

## 2021-05-14 DIAGNOSIS — Z6841 Body Mass Index (BMI) 40.0 and over, adult: Secondary | ICD-10-CM | POA: Diagnosis not present

## 2021-05-14 DIAGNOSIS — Z79899 Other long term (current) drug therapy: Secondary | ICD-10-CM

## 2021-05-14 DIAGNOSIS — N182 Chronic kidney disease, stage 2 (mild): Secondary | ICD-10-CM | POA: Diagnosis not present

## 2021-05-14 DIAGNOSIS — R809 Proteinuria, unspecified: Secondary | ICD-10-CM | POA: Diagnosis present

## 2021-05-14 DIAGNOSIS — E782 Mixed hyperlipidemia: Secondary | ICD-10-CM | POA: Diagnosis present

## 2021-05-14 DIAGNOSIS — T457X5A Adverse effect of anticoagulant antagonists, vitamin K and other coagulants, initial encounter: Secondary | ICD-10-CM | POA: Diagnosis not present

## 2021-05-14 DIAGNOSIS — Z87891 Personal history of nicotine dependence: Secondary | ICD-10-CM

## 2021-05-14 DIAGNOSIS — M109 Gout, unspecified: Secondary | ICD-10-CM | POA: Diagnosis present

## 2021-05-14 DIAGNOSIS — E1129 Type 2 diabetes mellitus with other diabetic kidney complication: Secondary | ICD-10-CM | POA: Diagnosis present

## 2021-05-14 DIAGNOSIS — Z7984 Long term (current) use of oral hypoglycemic drugs: Secondary | ICD-10-CM

## 2021-05-14 DIAGNOSIS — E1122 Type 2 diabetes mellitus with diabetic chronic kidney disease: Secondary | ICD-10-CM | POA: Diagnosis not present

## 2021-05-14 DIAGNOSIS — Z7982 Long term (current) use of aspirin: Secondary | ICD-10-CM

## 2021-05-14 DIAGNOSIS — Q631 Lobulated, fused and horseshoe kidney: Secondary | ICD-10-CM | POA: Diagnosis not present

## 2021-05-14 DIAGNOSIS — Z833 Family history of diabetes mellitus: Secondary | ICD-10-CM

## 2021-05-14 HISTORY — PX: INTRAOPERATIVE TRANSTHORACIC ECHOCARDIOGRAM: SHX6523

## 2021-05-14 HISTORY — PX: TRANSCATHETER AORTIC VALVE REPLACEMENT, TRANSFEMORAL: SHX6400

## 2021-05-14 HISTORY — DX: Presence of prosthetic heart valve: Z95.2

## 2021-05-14 LAB — POCT ACTIVATED CLOTTING TIME
Activated Clotting Time: 149 seconds
Activated Clotting Time: 131 seconds
Activated Clotting Time: 329 seconds

## 2021-05-14 LAB — ECHOCARDIOGRAM LIMITED
AR max vel: 2.1 cm2
AV Area VTI: 1.79 cm2
AV Area mean vel: 1.84 cm2
AV Mean grad: 14 mmHg
AV Peak grad: 16.4 mmHg
Ao pk vel: 2.02 m/s
Calc EF: 50.6 %
S' Lateral: 2.9 cm
Single Plane A2C EF: 53.9 %
Single Plane A4C EF: 48.3 %

## 2021-05-14 LAB — POCT I-STAT, CHEM 8
BUN: 13 mg/dL (ref 8–23)
BUN: 13 mg/dL (ref 8–23)
Calcium, Ion: 1.3 mmol/L (ref 1.15–1.40)
Calcium, Ion: 1.29 mmol/L (ref 1.15–1.40)
Chloride: 105 mmol/L (ref 98–111)
Chloride: 106 mmol/L (ref 98–111)
Creatinine, Ser: 0.7 mg/dL (ref 0.61–1.24)
Creatinine, Ser: 0.8 mg/dL (ref 0.61–1.24)
Glucose, Bld: 165 mg/dL — ABNORMAL HIGH (ref 70–99)
Glucose, Bld: 178 mg/dL — ABNORMAL HIGH (ref 70–99)
HCT: 40 % (ref 39.0–52.0)
HCT: 37 % — ABNORMAL LOW (ref 39.0–52.0)
Hemoglobin: 13.6 g/dL (ref 13.0–17.0)
Hemoglobin: 12.6 g/dL — ABNORMAL LOW (ref 13.0–17.0)
Potassium: 4.3 mmol/L (ref 3.5–5.1)
Potassium: 4 mmol/L (ref 3.5–5.1)
Sodium: 140 mmol/L (ref 135–145)
Sodium: 141 mmol/L (ref 135–145)
TCO2: 27 mmol/L (ref 22–32)
TCO2: 25 mmol/L (ref 22–32)

## 2021-05-14 LAB — GLUCOSE, CAPILLARY
Glucose-Capillary: 179 mg/dL — ABNORMAL HIGH (ref 70–99)
Glucose-Capillary: 174 mg/dL — ABNORMAL HIGH (ref 70–99)
Glucose-Capillary: 317 mg/dL — ABNORMAL HIGH (ref 70–99)

## 2021-05-14 SURGERY — IMPLANTATION, AORTIC VALVE, TRANSCATHETER, FEMORAL APPROACH
Anesthesia: Monitor Anesthesia Care

## 2021-05-14 MED ORDER — SODIUM CHLORIDE 0.9% FLUSH
3.0000 mL | Freq: Two times a day (BID) | INTRAVENOUS | Status: DC
  Administered 2021-05-14: 3 mL via INTRAVENOUS

## 2021-05-14 MED ORDER — HEPARIN (PORCINE) IN NACL 1000-0.9 UT/500ML-% IV SOLN
INTRAVENOUS | Status: AC
  Filled 2021-05-14: qty 500

## 2021-05-14 MED ORDER — SODIUM CHLORIDE 0.9 % IV SOLN: INTRAVENOUS | Status: DC

## 2021-05-14 MED ORDER — ALLOPURINOL 300 MG PO TABS
300.0000 mg | ORAL_TABLET | Freq: Every day | ORAL | Status: DC
  Administered 2021-05-14 – 2021-05-15 (×2): 300 mg via ORAL
  Filled 2021-05-14 (×2): qty 1

## 2021-05-14 MED ORDER — TAMSULOSIN HCL 0.4 MG PO CAPS
0.4000 mg | ORAL_CAPSULE | Freq: Every day | ORAL | Status: DC
  Administered 2021-05-14 – 2021-05-15 (×2): 0.4 mg via ORAL
  Filled 2021-05-14 (×2): qty 1

## 2021-05-14 MED ORDER — DEXAMETHASONE SODIUM PHOSPHATE 10 MG/ML IJ SOLN
INTRAMUSCULAR | Status: DC | PRN
  Administered 2021-05-14: 10 mg via INTRAVENOUS

## 2021-05-14 MED ORDER — OXYCODONE HCL 5 MG PO TABS: 5.0000 mg | ORAL_TABLET | ORAL | Status: DC | PRN

## 2021-05-14 MED ORDER — FENTANYL CITRATE (PF) 100 MCG/2ML IJ SOLN
INTRAMUSCULAR | Status: DC | PRN
  Administered 2021-05-14: 25 ug via INTRAVENOUS

## 2021-05-14 MED ORDER — PROPOFOL 500 MG/50ML IV EMUL
INTRAVENOUS | Status: DC | PRN
  Administered 2021-05-14: 20 ug/kg/min via INTRAVENOUS

## 2021-05-14 MED ORDER — IOHEXOL 350 MG/ML SOLN
INTRAVENOUS | Status: DC | PRN
Start: 2021-05-14 — End: 2021-05-14
  Administered 2021-05-14: 40 mL via INTRA_ARTERIAL

## 2021-05-14 MED ORDER — MORPHINE SULFATE (PF) 2 MG/ML IV SOLN: 1.0000 mg | INTRAVENOUS | Status: DC | PRN

## 2021-05-14 MED ORDER — SODIUM CHLORIDE 0.9% FLUSH: 3.0000 mL | INTRAVENOUS | Status: DC | PRN

## 2021-05-14 MED ORDER — SODIUM CHLORIDE 0.9 % IV SOLN: INTRAVENOUS | Status: AC

## 2021-05-14 MED ORDER — ACETAMINOPHEN 650 MG RE SUPP: 650.0000 mg | Freq: Four times a day (QID) | RECTAL | Status: DC | PRN

## 2021-05-14 MED ORDER — INSULIN ASPART 100 UNIT/ML IJ SOLN: 0.0000 [IU] | INTRAMUSCULAR | Status: DC | PRN

## 2021-05-14 MED ORDER — LACTATED RINGERS IV SOLN: INTRAVENOUS | Status: DC | PRN

## 2021-05-14 MED ORDER — CHLORHEXIDINE GLUCONATE 4 % EX LIQD: 60.0000 mL | Freq: Once | CUTANEOUS | Status: DC

## 2021-05-14 MED ORDER — PHENYLEPHRINE 40 MCG/ML (10ML) SYRINGE FOR IV PUSH (FOR BLOOD PRESSURE SUPPORT)
PREFILLED_SYRINGE | INTRAVENOUS | Status: DC | PRN
  Administered 2021-05-14 (×2): 80 ug via INTRAVENOUS

## 2021-05-14 MED ORDER — HEPARIN (PORCINE) IN NACL 1000-0.9 UT/500ML-% IV SOLN
INTRAVENOUS | Status: DC | PRN
  Administered 2021-05-14 (×3): 500 mL

## 2021-05-14 MED ORDER — ONDANSETRON HCL 4 MG/2ML IJ SOLN: 4.0000 mg | Freq: Four times a day (QID) | INTRAMUSCULAR | Status: DC | PRN

## 2021-05-14 MED ORDER — CEFAZOLIN SODIUM-DEXTROSE 2-4 GM/100ML-% IV SOLN
2.0000 g | Freq: Three times a day (TID) | INTRAVENOUS | Status: AC
  Administered 2021-05-14 – 2021-05-15 (×2): 2 g via INTRAVENOUS
  Filled 2021-05-14 (×2): qty 100

## 2021-05-14 MED ORDER — ACETAMINOPHEN 325 MG PO TABS: 650.0000 mg | ORAL_TABLET | Freq: Four times a day (QID) | ORAL | Status: DC | PRN

## 2021-05-14 MED ORDER — LIDOCAINE HCL 1 % IJ SOLN
INTRAMUSCULAR | Status: AC
  Filled 2021-05-14: qty 20

## 2021-05-14 MED ORDER — CHLORHEXIDINE GLUCONATE 0.12 % MT SOLN
15.0000 mL | Freq: Once | OROMUCOSAL | Status: AC
  Administered 2021-05-14: 15 mL via OROMUCOSAL
  Filled 2021-05-14: qty 15

## 2021-05-14 MED ORDER — ATORVASTATIN CALCIUM 40 MG PO TABS
40.0000 mg | ORAL_TABLET | Freq: Every day | ORAL | Status: DC
  Administered 2021-05-14: 40 mg via ORAL
  Filled 2021-05-14: qty 1

## 2021-05-14 MED ORDER — ALBUMIN HUMAN 5 % IV SOLN: INTRAVENOUS | Status: DC | PRN

## 2021-05-14 MED ORDER — NITROGLYCERIN IN D5W 200-5 MCG/ML-% IV SOLN
0.0000 ug/min | INTRAVENOUS | Status: DC
  Administered 2021-05-14: 20 ug/min via INTRAVENOUS

## 2021-05-14 MED ORDER — LIDOCAINE HCL (PF) 1 % IJ SOLN
INTRAMUSCULAR | Status: DC | PRN
  Administered 2021-05-14 (×2): 10 mL

## 2021-05-14 MED ORDER — SODIUM CHLORIDE 0.9 % IV SOLN: 250.0000 mL | INTRAVENOUS | Status: DC | PRN

## 2021-05-14 MED ORDER — ONDANSETRON HCL 4 MG/2ML IJ SOLN
INTRAMUSCULAR | Status: DC | PRN
  Administered 2021-05-14: 4 mg via INTRAVENOUS

## 2021-05-14 MED ORDER — PROPOFOL 10 MG/ML IV BOLUS
INTRAVENOUS | Status: DC | PRN
  Administered 2021-05-14: 10 mg via INTRAVENOUS

## 2021-05-14 MED ORDER — EPHEDRINE SULFATE-NACL 50-0.9 MG/10ML-% IV SOSY
PREFILLED_SYRINGE | INTRAVENOUS | Status: DC | PRN
Start: 2021-05-14 — End: 2021-05-14
  Administered 2021-05-14 (×2): 5 mg via INTRAVENOUS

## 2021-05-14 MED ORDER — INSULIN ASPART 100 UNIT/ML IJ SOLN
0.0000 [IU] | Freq: Three times a day (TID) | INTRAMUSCULAR | Status: DC
  Administered 2021-05-14: 16 [IU] via SUBCUTANEOUS
  Administered 2021-05-15: 4 [IU] via SUBCUTANEOUS

## 2021-05-14 MED ORDER — ASPIRIN EC 81 MG PO TBEC
81.0000 mg | DELAYED_RELEASE_TABLET | Freq: Every day | ORAL | Status: DC
  Administered 2021-05-14: 81 mg via ORAL
  Filled 2021-05-14: qty 1

## 2021-05-14 MED ORDER — CHLORHEXIDINE GLUCONATE 4 % EX LIQD: 30.0000 mL | CUTANEOUS | Status: DC

## 2021-05-14 MED ORDER — HEPARIN SODIUM (PORCINE) 1000 UNIT/ML IJ SOLN
INTRAMUSCULAR | Status: DC | PRN
Start: 2021-05-14 — End: 2021-05-14
  Administered 2021-05-14: 19000 [IU] via INTRAVENOUS

## 2021-05-14 MED ORDER — NITROGLYCERIN IN D5W 200-5 MCG/ML-% IV SOLN
INTRAVENOUS | Status: AC
  Filled 2021-05-14: qty 250

## 2021-05-14 MED ORDER — PROTAMINE SULFATE 10 MG/ML IV SOLN
INTRAVENOUS | Status: DC | PRN
  Administered 2021-05-14: 190 mg via INTRAVENOUS

## 2021-05-14 MED ORDER — TRAMADOL HCL 50 MG PO TABS: 50.0000 mg | ORAL_TABLET | ORAL | Status: DC | PRN

## 2021-05-14 SURGICAL SUPPLY — 29 items
BAG SNAP BAND KOVER 36X36 (MISCELLANEOUS) ×4 IMPLANT
CABLE ADAPT PACING TEMP 12FT (ADAPTER) ×1 IMPLANT
CATH COMMANDER DELIVERY SYS 29 (CATHETERS) ×1 IMPLANT
CATH DIAG 6FR PIGTAIL ANGLED (CATHETERS) ×2 IMPLANT
CATH INFINITI 6F AL2 (CATHETERS) ×1 IMPLANT
CATH S G BIP PACING (CATHETERS) ×1 IMPLANT
CLOSURE MYNX CONTROL 6F/7F (Vascular Products) ×1 IMPLANT
CLOSURE PERCLOSE PROSTYLE (VASCULAR PRODUCTS) ×2 IMPLANT
CRIMPER (MISCELLANEOUS) ×1 IMPLANT
DEVICE INFLATION ATRION QL38 (MISCELLANEOUS) ×1 IMPLANT
GUIDEWIRE SAFE TJ AMPLATZ EXST (WIRE) ×1 IMPLANT
KIT HEART LEFT (KITS) ×2 IMPLANT
KIT MICROPUNCTURE NIT STIFF (SHEATH) ×1 IMPLANT
KIT SAPIAN 3 ULTRA RESILIA 29 (Valve) ×1 IMPLANT
PACK CARDIAC CATHETERIZATION (CUSTOM PROCEDURE TRAY) ×2 IMPLANT
PATCH THROMBIX TOPICAL PLAIN (HEMOSTASIS) ×1 IMPLANT
SHEATH BRITE TIP 7FR 35CM (SHEATH) ×1 IMPLANT
SHEATH INTRODUCER SET 29 (SHEATH) ×1 IMPLANT
SHEATH PINNACLE 6F 10CM (SHEATH) ×1 IMPLANT
SHEATH PINNACLE 8F 10CM (SHEATH) ×1 IMPLANT
SHEATH PROBE COVER 6X72 (BAG) ×1 IMPLANT
SLEEVE REPOSITIONING LENGTH 30 (MISCELLANEOUS) ×1 IMPLANT
STOPCOCK MORSE 400PSI 3WAY (MISCELLANEOUS) ×4 IMPLANT
TRANSDUCER W/STOPCOCK (MISCELLANEOUS) ×4 IMPLANT
TUBING CONTRAST HIGH PRESS 48 (TUBING) ×1 IMPLANT
WIRE AMPLATZ SS-J .035X180CM (WIRE) ×1 IMPLANT
WIRE EMERALD 3MM-J .035X150CM (WIRE) ×1 IMPLANT
WIRE EMERALD 3MM-J .035X260CM (WIRE) ×1 IMPLANT
WIRE EMERALD ST .035X260CM (WIRE) ×1 IMPLANT

## 2021-05-14 NOTE — Anesthesia Procedure Notes (Signed)
Procedure Name: MAC Date/Time: 05/14/2021 2:06 PM Performed by: Dorann Lodge, CRNA Pre-anesthesia Checklist: Patient identified, Emergency Drugs available, Suction available, Patient being monitored and Timeout performed Patient Re-evaluated:Patient Re-evaluated prior to induction Oxygen Delivery Method: Simple face mask

## 2021-05-14 NOTE — Op Note (Signed)
[facility]   MULTIDISCIPLINARY HEART VALVE TEAM   TAVR OPERATIVE NOTE   Date of Procedure:  05/14/2021  Preoperative Diagnosis: Severe Aortic Stenosis   Postoperative Diagnosis: Same   Procedure:   Transcatheter Aortic Valve Replacement - Percutaneous Left Transfemoral Approach  Edwards Sapien 3 Ultra THV (size 29 mm, model # 9755RSL, serial # [id number])   Co-Surgeons:  Gaye Pollack, MD and Sherren Mocha, MD     Anesthesiologist:  A. Hodierne, MD  Echocardiographer:  Osborne Oman, MD  Pre-operative Echo Findings: Severe aortic stenosis Normal left ventricular systolic function  Post-operative Echo Findings: No paravalvular leak Normal left ventricular systolic function   BRIEF CLINICAL NOTE AND INDICATIONS FOR SURGERY  This 81 year old gentleman has stage D, severe, symptomatic aortic stenosis with New York Heart Association class III symptoms of exertional fatigue and shortness of breath consistent with chronic diastolic congestive heart failure.  I have personally reviewed his 2D echocardiogram, cardiac catheterization, and CTA studies.  His most recent echocardiogram shows a severely calcified and thickened aortic valve with restricted leaflet mobility.  The mean gradient was measured at 35 mmHg but an echo in January 2022 showed the gradient to be 47 mmHg.  I think this discrepancy is probably due to the poor windows on his most recent echo.  His aortic valve looks severely stenotic.  Left ventricular ejection fraction is normal.  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  I think that aortic valve replacement is indicated in this patient for relief of his symptoms and to prevent progressive left ventricular deterioration.  I agree that given his advanced age and comorbidities that transcatheter aortic valve  replacement is the best treatment for replacing his aortic valve.  His gated cardiac CTA shows anatomy suitable for TAVR using a SAPIEN 3 valve.  His abdominal and pelvic CTA shows adequate pelvic vascular anatomy to allow transfemoral insertion.   The patient and his sister were counseled at length regarding treatment alternatives for management of severe symptomatic aortic stenosis. The risks and benefits of surgical intervention has been discussed in detail. Long-term prognosis with medical therapy was discussed. Alternative approaches such as conventional surgical aortic valve replacement, transcatheter aortic valve replacement, and palliative medical therapy were compared and contrasted at length. This discussion was placed in the context of the patient's own specific clinical presentation and past medical history. All of their questions have been addressed.    Following the decision to proceed with transcatheter aortic valve replacement, a discussion was held regarding what types of management strategies would be attempted intraoperatively in the event of life-threatening complications, including whether or not the patient would be considered a candidate for the use of cardiopulmonary bypass and/or conversion to open sternotomy for attempted surgical intervention.  I do not think he is a candidate for emergent sternotomy to manage any intraoperative complications given his age, morbid obesity, comorbidities and restricted mobility.  The patient is aware of the fact that transient use of cardiopulmonary bypass may be necessary. The patient has been advised of a variety of complications that might develop including but not limited to risks of death, stroke, paravalvular leak, aortic dissection or other major vascular complications, aortic annulus rupture, device embolization, cardiac rupture or perforation, mitral regurgitation, acute myocardial infarction, arrhythmia, heart block or bradycardia requiring  permanent pacemaker placement, congestive heart failure, respiratory failure, renal failure, pneumonia, infection, other late complications  related to structural valve deterioration or migration, or other complications that might ultimately cause a temporary or permanent loss of functional independence or other long term morbidity. The patient provides full informed consent for the procedure as described and all questions were answered.      DETAILS OF THE OPERATIVE PROCEDURE  PREPARATION:    The patient was brought to the operating room on the above mentioned date and appropriate monitoring was established by the anesthesia team. The patient was placed in the supine position on the operating table.  Intravenous antibiotics were administered. The patient was monitored closely throughout the procedure under conscious sedation.    Baseline transthoracic echocardiogram was performed. The patient's abdomen and both groins were prepped and draped in a sterile manner. A time out procedure was performed.   PERIPHERAL ACCESS:    Using the modified Seldinger technique, femoral arterial and venous access was obtained with placement of 6 Fr sheaths on the right side.  A pigtail diagnostic catheter was passed through the right arterial sheath under fluoroscopic guidance into the aortic root.  A temporary transvenous pacemaker catheter was passed through the right femoral venous sheath under fluoroscopic guidance into the right ventricle.  The pacemaker was tested to ensure stable lead placement and pacemaker capture. Aortic root angiography was performed in order to determine the optimal angiographic angle for valve deployment.   TRANSFEMORAL ACCESS:   Percutaneous transfemoral access and sheath placement was performed using ultrasound guidance.  The left common femoral artery was cannulated using a micropuncture needle and appropriate location was verified using hand injection angiogram.  A pair of Abbott  Perclose percutaneous closure devices were placed and a 6 French sheath replaced into the femoral artery.  The patient was heparinized systemically and ACT verified > 250 seconds.    A 16 Fr transfemoral E-sheath was introduced into the left common femoral artery after progressively dilating over an Amplatz superstiff wire. An AL-2 catheter was used to direct a straight-tip exchange length wire across the native aortic valve into the left ventricle. This was exchanged out for a pigtail catheter and position was confirmed in the LV apex. Simultaneous LV and Ao pressures were recorded.  The pigtail catheter was exchanged for an Amplatz Extra-stiff wire in the LV apex.   BALLOON AORTIC VALVULOPLASTY:   Not performed   TRANSCATHETER HEART VALVE DEPLOYMENT:   An Edwards Sapien 3 Ultra transcatheter heart valve (size 29 mm) was prepared and crimped per manufacturer's guidelines, and the proper orientation of the valve is confirmed on the [employer] delivery system. The valve was advanced through the introducer sheath using normal technique until in an appropriate position in the abdominal aorta beyond the sheath tip. The balloon was then retracted and using the fine-tuning wheel was centered on the valve. The valve was then advanced across the aortic arch using appropriate flexion of the catheter. The valve was carefully positioned across the aortic valve annulus. The Commander catheter was retracted using normal technique. Once final position of the valve has been confirmed by angiographic assessment, the valve is deployed while temporarily holding ventilation and during rapid ventricular pacing to maintain systolic blood pressure < 50 mmHg and pulse pressure < 10 mmHg. The balloon inflation is held for >3 seconds after reaching full deployment volume. Once the balloon has fully deflated the balloon is retracted into the ascending aorta and valve function is assessed using echocardiography. There is  felt to be no paravalvular leak and no central aortic insufficiency.  The  patient's hemodynamic recovery following valve deployment is good.  The deployment balloon and guidewire are both removed.    PROCEDURE COMPLETION:   The sheath was removed and femoral artery closure performed.  Protamine was administered once femoral arterial repair was complete. The temporary pacemaker, pigtail catheters and femoral sheaths were removed with manual pressure used for hemostasis.  A Mynx femoral closure device was utilized following removal of the diagnostic sheath in the right femoral artery.  The patient tolerated the procedure well and is transported to the cath lab recovery area in stable condition. There were no immediate intraoperative complications. All sponge instrument and needle counts are verified correct at completion of the operation.   No blood products were administered during the operation.  The patient received a total of 40 mL of intravenous contrast during the procedure.   Gaye Pollack, MD 05/14/2021

## 2021-05-14 NOTE — Progress Notes (Signed)
°  Echocardiogram 2D Echocardiogram has been performed.  Jordan [name] 05/14/2021, 3:21 PM

## 2021-05-14 NOTE — Anesthesia Procedure Notes (Signed)
Arterial Line Insertion Start/End2/28/2023 11:11 AM Performed by: Valda Favia, CRNA, CRNA  Patient location: Pre-op. Preanesthetic checklist: patient identified, IV checked, site marked, risks and benefits discussed, surgical consent, monitors and equipment checked, pre-op evaluation, timeout performed and anesthesia consent Lidocaine 1% used for infiltration Right, radial was placed Catheter size: 20 G Hand hygiene performed , maximum sterile barriers used  and Seldinger technique used Allen's test indicative of satisfactory collateral circulation Attempts: 1 Procedure performed without using ultrasound guided technique. Following insertion, dressing applied and Biopatch. Post procedure assessment: normal  Patient tolerated the procedure well with no immediate complications.

## 2021-05-14 NOTE — Op Note (Signed)
[facility]   MULTIDISCIPLINARY HEART VALVE TEAM   TAVR OPERATIVE NOTE   Date of Procedure:  05/14/2021  Preoperative Diagnosis: Severe Aortic Stenosis   Postoperative Diagnosis: Same   Procedure:   Transcatheter Aortic Valve Replacement - Percutaneous  Transfemoral Approach  Edwards Sapien 3 Ultra THV (size 29 mm, serial # [id number])   Co-Surgeons:  Gaye Pollack, MD and Sherren Mocha, MD  Anesthesiologist:  Albertha Ghee, MD  Echocardiographer:  Rudean Haskell, MD  Pre-operative Echo Findings: Severe aortic stenosis Normal left ventricular systolic function  Post-operative Echo Findings: No paravalvular leak Unchanged/normal left ventricular systolic function  BRIEF CLINICAL NOTE AND INDICATIONS FOR SURGERY  81 year old gentleman with multiple comorbidities including hypertension, mixed hyperlipidemia, type 2 diabetes, gout, mild stage II chronic kidney disease, and morbid obesity.  He has developed progressive exertional dyspnea and fatigue.  He is demonstrated to have severe, symptomatic aortic stenosis with a mean transvalvular gradient of 47 mmHg and peak gradient of 63 mmHg.  LVEF is mildly reduced, estimated at 50 to 55%.  Preoperative testing included diagnostic right and left heart catheterization demonstrating total occlusion of an obtuse marginal branch of the circumflex with recommendations for medical therapy in an absence of anginal chest pain.  CTA studies demonstrate suitable vascular anatomy for transfemoral TAVR with plans to use a 29 mm Edwards SAPIEN 3 valve.  During the course of the patient's preoperative work up they have been evaluated comprehensively by a multidisciplinary team of specialists coordinated through the [facility] in the [facility].  They have been demonstrated to suffer from symptomatic severe aortic stenosis as noted above. The patient has been counseled extensively  as to the relative risks and benefits of all options for the treatment of severe aortic stenosis including long term medical therapy, conventional surgery for aortic valve replacement, and transcatheter aortic valve replacement.  The patient has been independently evaluated in formal cardiac surgical consultation by Dr Cyndia Bent, who deemed the patient appropriate for TAVR. Based upon review of all of the patient's preoperative diagnostic tests they are felt to be candidate for transcatheter aortic valve replacement using the transfemoral approach as an alternative to conventional surgery.    Following the decision to proceed with transcatheter aortic valve replacement, a discussion has been held regarding what types of management strategies would be attempted intraoperatively in the event of life-threatening complications, including whether or not the patient would be considered a candidate for the use of cardiopulmonary bypass and/or conversion to open sternotomy for attempted surgical intervention.  The patient has been advised of a variety of complications that might develop peculiar to this approach including but not limited to risks of death, stroke, paravalvular leak, aortic dissection or other major vascular complications, aortic annulus rupture, device embolization, cardiac rupture or perforation, acute myocardial infarction, arrhythmia, heart block or bradycardia requiring permanent pacemaker placement, congestive heart failure, respiratory failure, renal failure, pneumonia, infection, other late complications related to structural valve deterioration or migration, or other complications that might ultimately cause a temporary or permanent loss of functional independence or other long term morbidity.  The patient provides full informed consent for the procedure as described and all questions were answered preoperatively.  DETAILS OF THE OPERATIVE PROCEDURE  PREPARATION:   The patient is brought to the  operating room on the above mentioned date and central monitoring was established by the anesthesia team including placement of a radial arterial line. The patient is placed in the  supine position on the operating table.  Intravenous antibiotics are administered. The patient is monitored closely throughout the procedure under conscious sedation.  Baseline transthoracic echocardiogram is performed. The patient's chest, abdomen, both groins, and both lower extremities are prepared and draped in a sterile manner. A time out procedure is performed.   PERIPHERAL ACCESS:   Using ultrasound guidance, femoral arterial and venous access is obtained with placement of 6 Fr sheaths on the right side.  [name] images are digitally captured and stored in the patient's chart. A pigtail diagnostic catheter was passed through the femoral arterial sheath under fluoroscopic guidance into the aortic root.  A temporary transvenous pacemaker catheter was passed through the femoral venous sheath under fluoroscopic guidance into the right ventricle.  The pacemaker was tested to ensure stable lead placement and pacemaker capture. Aortic root angiography was performed in order to determine the optimal angiographic angle for valve deployment.  TRANSFEMORAL ACCESS:  A micropuncture technique is used to access the left femoral artery under fluoroscopic and ultrasound guidance.  2 Perclose devices are deployed at 10' and 2' positions to 'PreClose' the femoral artery. An 8 French sheath is placed and then an Amplatz Superstiff wire is advanced through the sheath. This is changed out for a 16 French transfemoral E-Sheath after progressively dilating over the Superstiff wire.  An AL-2 catheter was used to direct a straight-tip exchange length wire across the native aortic valve into the left ventricle. This was exchanged out for a pigtail catheter and position was confirmed in the LV apex. Simultaneous LV and Ao pressures were recorded.  The  pigtail catheter was exchanged for an Amplatz Extra-stiff wire in the LV apex.    BALLOON AORTIC VALVULOPLASTY:  Not performed  TRANSCATHETER HEART VALVE DEPLOYMENT:  An Edwards Sapien 3 transcatheter heart valve (size 29 mm) was prepared and crimped per manufacturer's guidelines, and the proper orientation of the valve is confirmed on the [employer] delivery system. The valve was advanced through the introducer sheath using normal technique until in an appropriate position in the abdominal aorta beyond the sheath tip. The balloon was then retracted and using the fine-tuning wheel was centered on the valve. The valve was then advanced across the aortic arch using appropriate flexion of the catheter. The valve was carefully positioned across the aortic valve annulus. The Commander catheter was retracted using normal technique. Once final position of the valve has been confirmed by angiographic assessment, the valve is deployed while temporarily holding ventilation and during rapid ventricular pacing to maintain systolic blood pressure < 50 mmHg and pulse pressure < 10 mmHg. The balloon inflation is held for >3 seconds after reaching full deployment volume. Once the balloon has fully deflated the balloon is retracted into the ascending aorta and valve function is assessed using echocardiography. The patient's hemodynamic recovery following valve deployment is good.  The deployment balloon and guidewire are both removed. Echo demostrated acceptable post-procedural gradients, stable mitral valve function, and no aortic insufficiency.    PROCEDURE COMPLETION:  The sheath was removed and femoral artery closure is performed using the 2 previously deployed Perclose devices.  Protamine is administered once femoral arterial repair was complete. The site is clear with no evidence of bleeding or hematoma after the sutures are tightened. The temporary pacemaker and pigtail catheters are removed. Mynx closure is  used for contralateral femoral arterial hemostasis for the 6 Fr sheath.  The patient tolerated the procedure well and is transported to the recovery area in stable condition.  He did have a period of prolonged hypotension after protamine for about 5 minutes requiring a brief period of vasopressor support. Echo is repeated and shows no effusion, unchanged LV function, and normal valve function. He recovered well with IV fluids. There were no immediate intraoperative complications. All sponge instrument and needle counts are verified correct at completion of the operation.   The patient received a total of 40 mL of intravenous contrast during the procedure.   Sherren Mocha, MD 05/14/2021 2:09 PM

## 2021-05-14 NOTE — Anesthesia Preprocedure Evaluation (Signed)
Anesthesia Evaluation  Patient identified by MRN, date of birth, ID band Patient awake    Reviewed: Allergy & Precautions, H&P , NPO status , Patient's Chart, lab work & pertinent test results  Airway Mallampati: II   Neck ROM: full    Dental   Pulmonary former smoker,    breath sounds clear to auscultation       Cardiovascular hypertension, + Peripheral Vascular Disease  + Valvular Problems/Murmurs AS  Rhythm:regular Rate:Normal     Neuro/Psych    GI/Hepatic   Endo/Other  diabetes, Type 2  Renal/GU      Musculoskeletal   Abdominal   Peds  Hematology   Anesthesia Other Findings   Reproductive/Obstetrics                             Anesthesia Physical Anesthesia Plan  ASA: 3  Anesthesia Plan: MAC   Post-op Pain Management:    Induction: Intravenous  PONV Risk Score and Plan: 1 and Ondansetron, Dexamethasone and Treatment may vary due to age or medical condition  Airway Management Planned: Simple Face Mask  Additional Equipment: Arterial line  Intra-op Plan:   Post-operative Plan: Post-operative intubation/ventilation  Informed Consent: I have reviewed the patients History and Physical, chart, labs and discussed the procedure including the risks, benefits and alternatives for the proposed anesthesia with the patient or authorized representative who has indicated his/her understanding and acceptance.     Dental advisory given  Plan Discussed with: CRNA, Anesthesiologist and Surgeon  Anesthesia Plan Comments:         Anesthesia Quick Evaluation

## 2021-05-14 NOTE — Progress Notes (Signed)
°  [place] VALVE TEAM  Patient doing well s/p TAVR. He is hemodynamically stable. Groin sites stable. ECG RBBB and borderline 1st deg AV block but no high grade block. Plan to DC arterial line and transfer to 4E.  Plan for early ambulation after bedrest completed and hopeful discharge over the next 24-48 hours.   Angelena Form PA-C  [facility]  Pager [phone]

## 2021-05-14 NOTE — Anesthesia Procedure Notes (Signed)
Arterial Line Insertion Start/End2/28/2023 2:10 PM, 05/14/2021 2:20 PM Performed by: Albertha Ghee, MD, anesthesiologist  Patient location: Pre-op. Preanesthetic checklist: patient identified, IV checked, site marked, risks and benefits discussed, surgical consent, monitors and equipment checked, pre-op evaluation, timeout performed and anesthesia consent Patient sedated Right, radial was placed Catheter size: 20 G Hand hygiene performed , maximum sterile barriers used  and Seldinger technique used  Attempts: 1 Procedure performed using ultrasound guided technique. Ultrasound Notes:anatomy identified, needle tip was noted to be adjacent to the nerve/plexus identified and no ultrasound evidence of intravascular and/or intraneural injection Following insertion, dressing applied and Biopatch. Post procedure assessment: normal and unchanged  Patient tolerated the procedure well with no immediate complications.

## 2021-05-14 NOTE — Interval H&P Note (Signed)
History and Physical Interval Note:  05/14/2021 10:07 AM  Jordan [name]  has presented today for surgery, with the diagnosis of Severe Aortic Stenosis.  The various methods of treatment have been discussed with the patient and family. After consideration of risks, benefits and other options for treatment, the patient has consented to  Procedure(s): Transcatheter Aortic Valve Replacement, Transfemoral (N/A) INTRAOPERATIVE TRANSTHORACIC ECHOCARDIOGRAM (N/A) as a surgical intervention.  The patient's history has been reviewed, patient examined, no change in status, stable for surgery.  I have reviewed the patient's chart and labs.  Questions were answered to the patient's satisfaction.     Gaye Pollack

## 2021-05-14 NOTE — Discharge Instructions (Signed)

## 2021-05-14 NOTE — Transfer of Care (Signed)
Immediate Anesthesia Transfer of Care Note  Patient: Jordan [name]  Procedure(s) Performed: Transcatheter Aortic Valve Replacement, Transfemoral INTRAOPERATIVE TRANSTHORACIC ECHOCARDIOGRAM  Patient Location: Cath Lab  Anesthesia Type:MAC  Level of Consciousness: awake and drowsy  Airway & Oxygen Therapy: Patient Spontanous Breathing and Patient connected to nasal cannula oxygen  Post-op Assessment: Report given to RN and Post -op Vital signs reviewed and stable  Post vital signs: Reviewed and stable  Last Vitals:  Vitals Value Taken Time  BP 128/71 05/14/21 1543  Temp    Pulse 59 05/14/21 1547  Resp 14 05/14/21 1547  SpO2 96 % 05/14/21 1547  Vitals shown include unvalidated device data.  Last Pain:  Vitals:   05/14/21 1030  TempSrc:   PainSc: 0-No pain      Patients Stated Pain Goal: 0 (35/00/93 8182)  Complications: There were no known notable events for this encounter.

## 2021-05-14 NOTE — Progress Notes (Signed)
Patient brought to 4E from cath lab. VSS. Telemetry box applied, CCMD notified. Patient states 0/10 on pain scale. Patient oriented to room and staff. Call bell in reach.  Daymon Larsen, RN

## 2021-05-15 ENCOUNTER — Inpatient Hospital Stay (HOSPITAL_COMMUNITY): Payer: Medicare HMO

## 2021-05-15 ENCOUNTER — Encounter (HOSPITAL_COMMUNITY): Payer: Self-pay | Admitting: Cardiovascular Disease

## 2021-05-15 ENCOUNTER — Inpatient Hospital Stay (HOSPITAL_COMMUNITY)
Admission: RE | Admit: 2021-05-15 | Discharge: 2021-05-15 | Disposition: A | Discharge status: Home / Self Care | Payer: Medicare HMO | Source: Home / Self Care | Attending: Cardiology | Admitting: Cardiology

## 2021-05-15 DIAGNOSIS — Z952 Presence of prosthetic heart valve: Secondary | ICD-10-CM

## 2021-05-15 DIAGNOSIS — I440 Atrioventricular block, first degree: Secondary | ICD-10-CM

## 2021-05-15 DIAGNOSIS — I350 Nonrheumatic aortic (valve) stenosis: Principal | ICD-10-CM

## 2021-05-15 DIAGNOSIS — I4510 Unspecified right bundle-branch block: Secondary | ICD-10-CM

## 2021-05-15 LAB — GLUCOSE, CAPILLARY
Glucose-Capillary: 209 mg/dL — ABNORMAL HIGH (ref 70–99)
Glucose-Capillary: 169 mg/dL — ABNORMAL HIGH (ref 70–99)

## 2021-05-15 LAB — CBC
HCT: 36.7 % — ABNORMAL LOW (ref 39.0–52.0)
Hemoglobin: 11.3 g/dL — ABNORMAL LOW (ref 13.0–17.0)
MCH: 25.8 pg — ABNORMAL LOW (ref 26.0–34.0)
MCHC: 30.8 g/dL (ref 30.0–36.0)
MCV: 83.8 fL (ref 80.0–100.0)
Platelets: 163 10*3/uL (ref 150–400)
RBC: 4.38 MIL/uL (ref 4.22–5.81)
RDW: 21 % — ABNORMAL HIGH (ref 11.5–15.5)
WBC: 9.6 10*3/uL (ref 4.0–10.5)
nRBC: 0 % (ref 0.0–0.2)

## 2021-05-15 LAB — BASIC METABOLIC PANEL
Anion gap: 8 (ref 5–15)
BUN: 15 mg/dL (ref 8–23)
CO2: 24 mmol/L (ref 22–32)
Calcium: 9.3 mg/dL (ref 8.9–10.3)
Chloride: 105 mmol/L (ref 98–111)
Creatinine, Ser: 0.97 mg/dL (ref 0.61–1.24)
GFR, Estimated: >60 mL/min (ref >60)
Glucose, Bld: 231 mg/dL — ABNORMAL HIGH (ref 70–99)
Potassium: 4.4 mmol/L (ref 3.5–5.1)
Sodium: 137 mmol/L (ref 135–145)

## 2021-05-15 LAB — ECHOCARDIOGRAM COMPLETE
AR max vel: 5 cm2
AV Area VTI: 4.72 cm2
AV Area mean vel: 4.86 cm2
AV Mean grad: 4 mmHg
AV Peak grad: 6.2 mmHg
Ao pk vel: 1.25 m/s
Area-P 1/2: 2.9 cm2
Height: 69 in
S' Lateral: 3.6 cm
Single Plane A4C EF: 60.6 %
Weight: 4744.3 oz

## 2021-05-15 LAB — MAGNESIUM: Magnesium: 1.8 mg/dL (ref 1.7–2.4)

## 2021-05-15 MED ORDER — PERFLUTREN LIPID MICROSPHERE
1.0000 mL | INTRAVENOUS | Status: AC | PRN
  Administered 2021-05-15: 2 mL via INTRAVENOUS
  Filled 2021-05-15: qty 10

## 2021-05-15 NOTE — Progress Notes (Signed)
Discharge instructions (including medications) discussed with and copy provided to patient/caregiver 

## 2021-05-15 NOTE — Progress Notes (Signed)
.  zio

## 2021-05-15 NOTE — Anesthesia Postprocedure Evaluation (Signed)
Anesthesia Post Note  Patient: NAGEE GOATES  Procedure(s) Performed: Transcatheter Aortic Valve Replacement, Transfemoral INTRAOPERATIVE TRANSTHORACIC ECHOCARDIOGRAM     Patient location during evaluation: Cath Lab Anesthesia Type: MAC Level of consciousness: awake and alert Pain management: pain level controlled Vital Signs Assessment: post-procedure vital signs reviewed and stable Respiratory status: spontaneous breathing, nonlabored ventilation, respiratory function stable and patient connected to nasal cannula oxygen Cardiovascular status: stable and blood pressure returned to baseline Postop Assessment: no apparent nausea or vomiting Anesthetic complications: no   There were no known notable events for this encounter.  Last Vitals:  Vitals:   05/15/21 0600 05/15/21 0751  BP: (!) 163/83 139/70  Pulse: 67 68  Resp: 12 20  Temp: 36.6 C (!) 36.3 C  SpO2: 96% 97%    Last Pain:  Vitals:   05/15/21 0930  TempSrc:   PainSc: 0-No pain                 Hamid Brookens S

## 2021-05-15 NOTE — TOC Transition Note (Signed)
Transition of Care (TOC) - CM/SW Discharge Note ?Marvetta Gibbons Therapist, sports, BSN ?Transitions of Care ?Unit 4E- RN Case Manager ?See Treatment Team for direct phone #  ? ? ?Patient Details  ?Name: Jordan [name] ?MRN: [id number] ?Date of Birth: 04/20/1940 ? ?Transition of Care (TOC) CM/SW Contact:  ?Dahlia Client, Romeo Rabon, RN ?Phone Number: ?05/15/2021, 11:36 AM ? ? ?Clinical Narrative:    ?Pt s/p TAVR, Transition of Care Department (TOC) has reviewed patient and no TOC needs have been identified at this time. Pt stable for transition home today.  ? ?Final next level of care: Home/Self Care ?Barriers to Discharge: No Barriers Identified ? ? ?Patient Goals and CMS Choice ?  ?  ?Choice offered to / list presented to : NA ? ?Discharge Placement ?  ?           ?  ? Home ?  ?  ? ?Discharge Plan and Services ?  ?  ?           ?DME Arranged: N/A ?DME Agency: NA ?  ?  ?  ?HH Arranged: NA ?[place] Agency: NA ?  ?  ?  ? ?Social Determinants of Health (SDOH) Interventions ?  ? ? ?Readmission Risk Interventions ?Readmission Risk Prevention Plan 05/15/2021  ?Post Dischage Appt Complete  ?Medication Screening Complete  ?Transportation Screening Complete  ?Some recent data might be hidden  ? ? ? ? ? ?

## 2021-05-15 NOTE — Progress Notes (Signed)
CARDIAC REHAB PHASE I  ? ?Offered to walk with pt. Pt eating breakfast, states he walked last night. Pt states his breathing feels easier and legs didn't get as tired. Reviewed site care and restrictions. Encouraged continued ambulation with emphasis on safety. Pt declines CRP II. ? ?[id number] ?Rufina Falco, RN BSN ?05/15/2021 ?10:01 AM ? ?

## 2021-05-15 NOTE — Progress Notes (Signed)
Routine on pre op TAVR

## 2021-05-15 NOTE — Discharge Summary (Addendum)
[facility]   MULTIDISCIPLINARY HEART VALVE TEAM  Discharge Summary    Patient ID: BREYDON SENTERS MRN: [id number]; DOB: 11/12/1940  Admit date: 05/14/2021 Discharge date: 05/15/2021  Primary Care Provider: Steele Sizer, MD  Primary Cardiologist: Dr. Rockey Situ, MD/ Dr.Timberlynn Kizziah, MD & Dr. 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 [facility])   Essential hypertension   Controlled type 2 diabetes mellitus with microalbuminuria (HCC)   Chronic bronchitis (HCC)   Atherosclerosis of aorta (HCC)   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HCC)   RBBB   1st degree AV block  Allergies Allergies  Allergen Reactions   Ace Inhibitors Other (See Comments)    angioedema    Diagnostic Studies/Procedures    TAVR OPERATIVE NOTE     Date of Procedure:                05/14/2021   Preoperative Diagnosis:      Severe Aortic Stenosis    Postoperative Diagnosis:    Same    Procedure:        Transcatheter Aortic Valve Replacement - Percutaneous Left Transfemoral Approach             Edwards Sapien 3 Ultra THV (size 29 mm, model # 9755RSL, serial # [id number])              Co-Surgeons:                        Gaye Pollack, MD and Sherren Mocha, MD       Anesthesiologist:                  Renaldo Reel, MD   Echocardiographer:              Osborne Oman, MD   Pre-operative Echo Findings: Severe aortic stenosis Normal left ventricular systolic function   Post-operative Echo Findings: No paravalvular leak Normal left ventricular systolic function _____________   Echo 05/15/21: Completed but pending formal read at the time of discharge   History of Present Illness     Jordan [name] is a 81 y.o. male with a history of diabetes, hypertension, CKD stage  IIa, HTN, HLD, morbid obesity, and severe aortic stenosis.   He is followed by Dr. Rockey Situ for his cardiology care. Echocardiogram from 2014 showed normal LVEF with mild aortic stenosis. He had many repeat studies over the course of several years that showed slow progression. Repeat echocardiogram 02/2021 showed an EF at 50-55% with AVA by VTI 1.37cm2, mean gradient 91mHg, peak 54.537mg. He was then referred to Dr. CoBurt Knack2/30/22 for the evaluation of severe, symptomatic aortic stenosis. At that time, he reported progressive exertional dyspnea with low-level activities. He was previously able to walk an hour per day for exercise.   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 Otherwise, there was mild-moderate, non-obstructive coronary artery disease. Plan for for continued medical management of his CAD including aggressive secondary prevention.   He was then evaluated by the multidisciplinary valve team, including Dr. BaCyndia Bentnd felt to have severe, symptomatic aortic stenosis and to be a suitable candidate for TAVR, which was set up for 05/14/21.     Hospital Course   Severe AS:  s/p successful TAVR with a 56m Edwards Sapien 3 THV via the TF approach on 05/14/21. Post operative echo pending. Groin sites are stable. ECG with NSR with 1st degree AV block however no high grade heart block. Continue ASA monotherapy. Patient has ambulated with CRI without complication. SBE discussed however will be RX'ed at follow up next week. All post TAVR instructions reviewed with understanding. Given known RBBB, ZIO placed at d/c. Will follow results OP.   DM2: Restart home regimen and follow with PCP. Will hold Metformin until 05/16/21 given IV contrast.   CKD stage IIa: Cr, 0.97 today.   HTN: Improved by day of discharge. Antihypertensives held given   HLD: Last LDL, 86. Continue Lipitor 464m Follow with primary team.   Morbid obesity: BMI  43.79. Hopeful to get him back to walking 60 minutes per day after TAVR.   Incidental finding: Cross-fused renal ectopia (horseshoe kidney), normal anatomical variant, incidentally noted. However, today's study demonstrates anew heterogeneously enhancing mass in the right renal moiety which is highly suspicious for renal cell carcinoma. At this time, this is encapsulated within Gerota's fascia, is separate from the right renal vein which is patent, and not associated with definite lymphadenopathy or signs of metastatic disease. Referral to Urology for further clinical evaluation and future management is strongly recommended in the near future. Dr. BeLouis Meckel2/23/23 @ 1:45  Consultants: None    The patient was seen and examined by Dr. CoBurt Knackho feels that he is stable and ready for discharge today 05/15/21.  _____________  Discharge Vitals Blood pressure 139/70, pulse 68, temperature (!) 97.4 F (36.3 C), temperature source Oral, resp. rate 20, height '5\' 9"'  (1.753 m), weight 134.5 kg, SpO2 97 %.  Filed Weights   05/14/21 1020 05/15/21 0400  Weight: 131.5 kg 134.5 kg   General: Obese, NAD Neck: Negative for carotid bruits. No JVD Lungs:Clear to ausculation bilaterally. No wheezes, rales, or rhonchi. Breathing is unlabored. Cardiovascular: RRR with S1 S2. Soft flow murmur Extremities: No edema. Groin site with no evidence of bleeding or hematoma.  Neuro: Alert and oriented. No focal deficits. No facial asymmetry. MAE spontaneously. Psych: Responds to questions appropriately with normal affect.    Labs & Radiologic Studies    CBC Recent Labs    05/14/21 1554 05/15/21 0154  WBC  --  9.6  HGB 12.6* 11.3*  HCT 37.0* 36.7*  MCV  --  83.8  PLT  --  16924 Basic Metabolic Panel Recent Labs    05/14/21 1554 05/15/21 0154  NA 141 137  K 4.0 4.4  CL 106 105  CO2  --  24  GLUCOSE 178* 231*  BUN 13 15  CREATININE 0.80 0.97  CALCIUM  --  9.3  MG  --  1.8   Liver Function  Tests No results for input(s): AST, ALT, ALKPHOS, BILITOT, PROT, ALBUMIN in the last 72 hours. No results for input(s): LIPASE, AMYLASE in the last 72 hours. Cardiac Enzymes No results for input(s): CKTOTAL, CKMB, CKMBINDEX, TROPONINI in the last 72 hours. BNP Invalid input(s): POCBNP D-Dimer No results for input(s): DDIMER in the last 72 hours. Hemoglobin A1C No results for input(s): HGBA1C in the last 72 hours. Fasting Lipid Panel No results for input(s): CHOL, HDL, LDLCALC, TRIG, CHOLHDL, LDLDIRECT in the last 72 hours. Thyroid Function Tests No results for input(s): TSH, T4TOTAL, T3FREE, THYROIDAB in the last 72 hours.  Invalid input(s): FREET3 _____________  DG Chest 2 View  Result Date: 05/13/2021  CLINICAL DATA:  Preop, severe aortic stenosis. EXAM: CHEST - 2 VIEW COMPARISON:  Chest CTA 04/08/2021 FINDINGS: Chronic eventration of right hemidiaphragm.The cardiomediastinal contours are normal. The lungs are clear. Pulmonary vasculature is normal. No consolidation, pleural effusion, or pneumothorax. No acute osseous abnormalities are seen. IMPRESSION: No acute chest findings. Chronic eventration of right hemidiaphragm. Electronically Signed   By: Keith Rake M.D.   On: 05/13/2021 17:01   ECHOCARDIOGRAM LIMITED  Result Date: 05/14/2021    ECHOCARDIOGRAM LIMITED REPORT   Patient Name:   Jordan [name] Date of Exam: 05/14/2021 Medical Rec #:  [id number]         Height:       69.0 in Accession #:    [id number]        Weight:       290.0 lb Date of Birth:  Sep 23, 1940         BSA:          2.419 m Patient Age:    40 years          BP:           161/81 mmHg Patient Gender: M                 HR:           77 bpm. Exam Location:  Inpatient Procedure: Limited Echo, Cardiac Doppler and Color Doppler Indications:     I35.0 Nonrheumatic aortic (valve) stenosis  History:         Patient has prior history of Echocardiogram examinations, most                  recent 03/15/2021. Aortic Valve Disease;  Risk Factors:Diabetes                  and Hypertension. Severe aortic stenosis.                  Aortic Valve: 29 mm Edwards Sapien prosthetic, stented (TAVR)                  valve is present in the aortic position. Procedure Date:                  05/14/2021.  Sonographer:     Roseanna Rainbow RDCS Referring Phys:  [id number] Woodfin Ganja THOMPSON Diagnosing Phys: Rudean Haskell MD  Sonographer Comments: Technically difficult study due to poor echo windows. Image acquisition challenging due to patient body habitus. TAVR procedure. IMPRESSIONS  1. Interventional echo for TAVR placement.  2. Prior to procedure, calcified aortic valve with AVA 1.02 cm2 by continuity equation, with mean gradient 26 mmHg, peak gradient 37 mm Hg, trivial AI, and DVI 0.19 consistent with moderate to severea aortic stenosis.  3. There is a 29 mm Edwards Sapien prosthetic (TAVR) valve present in the aortic position. There is no paravalvular leak. Effective orifice area 4.22 cm2. mean gradient 2 mm Hg, peak graidnet 4 mm Hg, DVI 0.79.  4. Post protamine, patient was hypotensive. There was no new pericardial effusion, change in LVEF, or change in prosthetic parameters. Hypotension resolved.  5. Left ventricular ejection fraction, by estimation, is 50 to 55%. The left ventricle has low normal function. The left ventricle has no regional wall motion abnormalities. There is mild concentric left ventricular hypertrophy.  6. Right ventricular systolic function is normal. The right ventricular size is normal.  7. The mitral valve is grossly normal. No evidence of mitral valve regurgitation.  8. Aortic  dilatation noted. There is mild dilatation of the ascending aorta, measuring 41 mm. Comparison(s): Successful TAVR implantation. FINDINGS  Left Ventricle: Left ventricular ejection fraction, by estimation, is 50 to 55%. The left ventricle has low normal function. The left ventricle has no regional wall motion abnormalities. There is mild concentric left  ventricular hypertrophy. Right Ventricle: The right ventricular size is normal. Right ventricular systolic function is normal. Pericardium: Trivial pericardial effusion is present. Presence of epicardial fat layer. Mitral Valve: The mitral valve is grossly normal. Tricuspid Valve: Tricuspid valve regurgitation is mild. Aortic Valve: Aortic valve mean gradient measures 14.0 mmHg. Aortic valve peak gradient measures 16.4 mmHg. Aortic valve area, by VTI measures 1.79 cm. There is a 29 mm Edwards Sapien prosthetic, stented (TAVR) valve present in the aortic position. Procedure Date: 05/14/2021. Aorta: Aortic dilatation noted. There is mild dilatation of the ascending aorta, measuring 41 mm. LEFT VENTRICLE PLAX 2D LVIDd:         3.90 cm LVIDs:         2.90 cm LV PW:         1.30 cm LV IVS:        1.10 cm LVOT diam:     2.65 cm LV SV:         90 LV SV Index:   37 LVOT Area:     5.52 cm  LV Volumes (MOD) LV vol d, MOD A2C: 80.0 ml LV vol d, MOD A4C: 122.0 ml LV vol s, MOD A2C: 36.9 ml LV vol s, MOD A4C: 63.1 ml LV SV MOD A2C:     43.1 ml LV SV MOD A4C:     122.0 ml LV SV MOD BP:      52.9 ml AORTIC VALVE AV Area (Vmax):    2.10 cm AV Area (Vmean):   1.84 cm AV Area (VTI):     1.79 cm AV Vmax:           202.35 cm/s AV Vmean:          156.450 cm/s AV VTI:            0.501 m AV Peak Grad:      16.4 mmHg AV Mean Grad:      14.0 mmHg LVOT Vmax:         76.95 cm/s LVOT Vmean:        52.200 cm/s LVOT VTI:          0.162 m LVOT/AV VTI ratio: 0.32  AORTA Ao Asc diam: 3.95 cm  SHUNTS Systemic VTI:  0.16 m Systemic Diam: 2.65 cm Rudean Haskell MD Electronically signed by Rudean Haskell MD Signature Date/Time: 05/14/2021/5:15:10 PM    Final    Structural Heart Procedure  Result Date: 05/14/2021 See surgical note for result.  Disposition   Pt is being discharged home today in good condition.  Follow-up Plans & Appointments     Follow-up Information     Tommie Raymond, NP. Go on 05/20/2021.   Specialty:  Cardiology Why: @ 1pm, please arrive at least 10 minutes early. Contact information: [address] [place] [address] [phone]                Discharge Instructions     Call MD for:  difficulty breathing, headache or visual disturbances   Complete by: As directed    Call MD for:  extreme fatigue   Complete by: As directed    Call MD for:  hives  Complete by: As directed    Call MD for:  persistant dizziness or light-headedness   Complete by: As directed    Call MD for:  persistant nausea and vomiting   Complete by: As directed    Call MD for:  redness, tenderness, or signs of infection (pain, swelling, redness, odor or green/yellow discharge around incision site)   Complete by: As directed    Call MD for:  severe uncontrolled pain   Complete by: As directed    Call MD for:  temperature >100.4   Complete by: As directed    Diet - low sodium heart healthy   Complete by: As directed    Discharge instructions   Complete by: As directed    Please restart Metformin 05/16/21.   ACTIVITY AND EXERCISE  Daily activity and exercise are an important part of your recovery. People recover at different rates depending on their general health and type of valve procedure.  Most people recovering from TAVR feel better relatively quickly   No lifting, pushing, pulling more than 10 pounds (examples to avoid: groceries, vacuuming, gardening, golfing):             - For one week with a procedure through the groin.             - For six weeks for procedures through the chest wall or neck. NOTE: You will typically see one of our providers 7-14 days after your procedure to discuss [place] the above activities.      DRIVING  Do not drive until you are seen for follow up and cleared by a provider. Generally, we ask patient to not drive for 1 week after their procedure.  If you have been told by your doctor in the past that you may not drive, you must talk with  him/her before you begin driving again.   DRESSING  Groin site: you may leave the clear dressing over the site for up to one week or until it falls off.   HYGIENE  If you had a femoral (leg) procedure, you may take a shower when you return home. After the shower, pat the site dry. Do NOT use powder, oils or lotions in your groin area until the site has completely healed.  If you had a chest procedure, you may shower when you return home unless specifically instructed not to by your discharging practitioner.             - DO NOT scrub incision; pat dry with a towel.             - DO NOT apply any lotions, oils, powders to the incision.             - No tub baths / swimming for at least 2 weeks.  If you notice any fevers, chills, increased pain, swelling, bleeding or pus, please contact your doctor.   ADDITIONAL INFORMATION  If you are going to have an upcoming dental procedure, please contact our office as you will require antibiotics ahead of time to prevent infection on your heart valve.    If you have any questions or concerns you can call the structural heart phone during normal business hours 8am-4pm. If you have an urgent need after hours or weekends please call [phone] to talk to the on call provider for general cardiology. If you have an emergency that requires immediate attention, please call 911.    After TAVR Checklist  Check  Test Description  Follow up appointment in 1-2 weeks  You will see our structural heart advanced practice provider. Your incision sites will be checked and you will be cleared to drive and resume all normal activities if you are doing well.    1 month echo and follow up  You will have an echo to check on your new heart valve and be seen back in the office by a structural heart advanced practice provider.  Follow up with your primary cardiologist You will need to be seen by your primary cardiologist in the following 3-6 months after your 1 month  appointment in the valve clinic.   1 year echo and follow up You will have another echo to check on your heart valve after 1 year and be seen back in the office by a structural heart advanced practice provider. This your last structural heart visit.  Bacterial endocarditis prophylaxis  You will have to take antibiotics for the rest of your life before all dental procedures (even teeth cleanings) to protect your heart valve. Antibiotics are also required before some surgeries. Please check with your cardiologist before scheduling any surgeries. Also, please make sure to tell us if you have a penicillin allergy as you will require an alternative antibiotic.   If the dressing is still on your incision site when you go home, remove it on the third day after your surgery date. Remove dressing if it begins to fall off, or if it is dirty or damaged before the third day.   Complete by: As directed    Increase activity slowly   Complete by: As directed       Discharge Medications   Allergies as of 05/15/2021       Reactions   Ace Inhibitors Other (See Comments)   angioedema        Medication List     TAKE these medications    Accu-Chek Aviva Plus test strip Generic drug: glucose blood TEST TWO TIMES DAILY AS NEEDED AS DIRECTED   Accu-Chek Aviva Plus w/Device Kit 1 each by Does not apply route 2 (two) times daily. Use as directed   Accu-Chek FastClix Lancets Misc 1 each by Subdermal route 2 (two) times daily.   Accu-Chek Guide Control Liqd 1 each by In Vitro route as needed.   allopurinol 300 MG tablet Commonly known as: ZYLOPRIM Take 1 tablet (300 mg total) by mouth daily.   amLODipine 10 MG tablet Commonly known as: NORVASC Take 1 tablet (10 mg total) by mouth daily.   aspirin EC 81 MG tablet Take 1 tablet (81 mg total) by mouth daily. Swallow whole. What changed: when to take this   atorvastatin 40 MG tablet Commonly known as: LIPITOR Take 1 tablet (40 mg total) by mouth  at bedtime.   B-D SINGLE USE SWABS REGULAR Pads 1 each by Does not apply route as needed.   ferrous sulfate 325 (65 FE) MG tablet Take 325 mg by mouth daily.   FISH OIL PO Take 1 capsule by mouth 2 (two) times daily.   furosemide 20 MG tablet Commonly known as: Lasix Take 1 tablet (20 mg total) by mouth daily.   glipiZIDE 5 MG 24 hr tablet Commonly known as: GLUCOTROL XL Take 1 tablet (5 mg total) by mouth daily with breakfast.   latanoprost 0.005 % ophthalmic solution Commonly known as: XALATAN Place 1 drop into the left eye at bedtime.   metFORMIN 750 MG 24 hr tablet Commonly known as: GLUCOPHAGE-XR Take 2 tablets (  1,500 mg total) by mouth daily with breakfast.   polyethylene glycol 17 g packet Commonly known as: MIRALAX / GLYCOLAX Take 17 g by mouth at bedtime.   tadalafil 10 MG tablet Commonly known as: CIALIS Take 1-2 tablets (10-20 mg total) by mouth every three (3) days as needed for erectile dysfunction.   tamsulosin 0.4 MG Caps capsule Commonly known as: FLOMAX Take 1 capsule (0.4 mg total) by mouth daily.   VITAMIN D3 PO Take 1 tablet by mouth every Monday, Wednesday, and Friday.               Discharge Care Instructions  (From admission, onward)           Start     Ordered   05/15/21 0000  If the dressing is still on your incision site when you go home, remove it on the third day after your surgery date. Remove dressing if it begins to fall off, or if it is dirty or damaged before the third day.        05/15/21 1044            Outstanding Labs/Studies   None   Duration of Discharge Encounter   Greater than 30 minutes including physician time.  SignedKathyrn Drown, NP 05/15/2021, 10:44 AM [phone]   Patient seen, examined. Available data reviewed. Agree with findings, assessment, and plan as outlined by Kathyrn Drown, NP.  The patient is independently interviewed and examined.  He is alert, oriented, obese gentleman in no  distress.  HEENT is normal, JVP is normal, lungs are clear bilaterally, heart is regular rate and rhythm with a 2/6 systolic ejection murmur at the right upper sternal border (early peaking), no diastolic murmur, abdomen is soft and nontender, bilateral groin sites are clear with no hematoma or ecchymosis, lower extremities have no significant edema.  Skin is warm and dry with no rash.  The patient is medically stable after TAVR.  Because of his baseline right bundle branch block, he will be discharged on a ZIO monitor.  He has had no bradycardia arrhythmias during his telemetry monitoring here in the hospital.  Post-TAVR instructions were reviewed with the patient.  I agree with the medication regimen as outlined above.  Sherren Mocha, M.D. 05/15/2021 10:46 AM

## 2021-05-15 NOTE — Plan of Care (Signed)
  Problem: Education: Goal: Knowledge of General Education information will improve Description: Including pain rating scale, medication(s)/side effects and non-pharmacologic comfort measures Outcome: Adequate for Discharge   

## 2021-05-16 ENCOUNTER — Telehealth: Payer: Self-pay | Admitting: Physician Assistant

## 2021-05-16 DIAGNOSIS — Z952 Presence of prosthetic heart valve: Secondary | ICD-10-CM | POA: Diagnosis not present

## 2021-05-16 DIAGNOSIS — I440 Atrioventricular block, first degree: Secondary | ICD-10-CM | POA: Diagnosis not present

## 2021-05-16 NOTE — Telephone Encounter (Signed)
?  [facility]   ?MULTIDISCIPLINARY HEART VALVE TEAM ? ? ?Patient contacted regarding discharge from [facility] on 3/1 ? ?Patient understands to follow up with a structural heart APP on 3/6 @1pm  at [place].  ?Patient understands discharge instructions? yes ?Patient understands medications and regimen? yes ?Patient understands to bring all medications to this visit? yes ? ?Angelena Form PA-C  [facility]  ? ? ? ?

## 2021-05-18 ENCOUNTER — Telehealth: Payer: Self-pay | Admitting: Physician Assistant

## 2021-05-18 ENCOUNTER — Inpatient Hospital Stay (HOSPITAL_COMMUNITY)
Admission: EM | Admit: 2021-05-18 | Discharge: 2021-05-22 | DRG: 243 | Disposition: A | Discharge status: Home / Self Care | Payer: Medicare HMO | Attending: Cardiology | Admitting: Cardiology

## 2021-05-18 ENCOUNTER — Emergency Department (HOSPITAL_COMMUNITY): Payer: Medicare HMO

## 2021-05-18 ENCOUNTER — Encounter (HOSPITAL_COMMUNITY): Payer: Self-pay | Admitting: Emergency Medicine

## 2021-05-18 ENCOUNTER — Other Ambulatory Visit: Payer: Self-pay

## 2021-05-18 DIAGNOSIS — Z833 Family history of diabetes mellitus: Secondary | ICD-10-CM

## 2021-05-18 DIAGNOSIS — M109 Gout, unspecified: Secondary | ICD-10-CM | POA: Diagnosis present

## 2021-05-18 DIAGNOSIS — Z6841 Body Mass Index (BMI) 40.0 and over, adult: Secondary | ICD-10-CM | POA: Diagnosis not present

## 2021-05-18 DIAGNOSIS — Z8042 Family history of malignant neoplasm of prostate: Secondary | ICD-10-CM

## 2021-05-18 DIAGNOSIS — N2889 Other specified disorders of kidney and ureter: Secondary | ICD-10-CM | POA: Diagnosis present

## 2021-05-18 DIAGNOSIS — E785 Hyperlipidemia, unspecified: Secondary | ICD-10-CM | POA: Diagnosis present

## 2021-05-18 DIAGNOSIS — Z87891 Personal history of nicotine dependence: Secondary | ICD-10-CM

## 2021-05-18 DIAGNOSIS — I441 Atrioventricular block, second degree: Secondary | ICD-10-CM | POA: Diagnosis present

## 2021-05-18 DIAGNOSIS — Z20822 Contact with and (suspected) exposure to covid-19: Secondary | ICD-10-CM | POA: Diagnosis present

## 2021-05-18 DIAGNOSIS — I10 Essential (primary) hypertension: Secondary | ICD-10-CM | POA: Diagnosis not present

## 2021-05-18 DIAGNOSIS — Z950 Presence of cardiac pacemaker: Secondary | ICD-10-CM

## 2021-05-18 DIAGNOSIS — Z7982 Long term (current) use of aspirin: Secondary | ICD-10-CM

## 2021-05-18 DIAGNOSIS — Z8249 Family history of ischemic heart disease and other diseases of the circulatory system: Secondary | ICD-10-CM | POA: Diagnosis not present

## 2021-05-18 DIAGNOSIS — I499 Cardiac arrhythmia, unspecified: Secondary | ICD-10-CM

## 2021-05-18 DIAGNOSIS — Z952 Presence of prosthetic heart valve: Secondary | ICD-10-CM | POA: Diagnosis not present

## 2021-05-18 DIAGNOSIS — I442 Atrioventricular block, complete: Secondary | ICD-10-CM | POA: Diagnosis not present

## 2021-05-18 DIAGNOSIS — R079 Chest pain, unspecified: Secondary | ICD-10-CM | POA: Diagnosis not present

## 2021-05-18 DIAGNOSIS — E1122 Type 2 diabetes mellitus with diabetic chronic kidney disease: Secondary | ICD-10-CM | POA: Diagnosis present

## 2021-05-18 DIAGNOSIS — N182 Chronic kidney disease, stage 2 (mild): Secondary | ICD-10-CM | POA: Diagnosis present

## 2021-05-18 DIAGNOSIS — I2510 Atherosclerotic heart disease of native coronary artery without angina pectoris: Secondary | ICD-10-CM | POA: Diagnosis not present

## 2021-05-18 DIAGNOSIS — R0602 Shortness of breath: Secondary | ICD-10-CM | POA: Diagnosis not present

## 2021-05-18 DIAGNOSIS — R001 Bradycardia, unspecified: Secondary | ICD-10-CM | POA: Diagnosis not present

## 2021-05-18 DIAGNOSIS — I129 Hypertensive chronic kidney disease with stage 1 through stage 4 chronic kidney disease, or unspecified chronic kidney disease: Secondary | ICD-10-CM | POA: Diagnosis present

## 2021-05-18 DIAGNOSIS — Z79899 Other long term (current) drug therapy: Secondary | ICD-10-CM | POA: Diagnosis not present

## 2021-05-18 DIAGNOSIS — Z7984 Long term (current) use of oral hypoglycemic drugs: Secondary | ICD-10-CM | POA: Diagnosis not present

## 2021-05-18 LAB — CBC
HCT: 39.3 % (ref 39.0–52.0)
Hemoglobin: 12.4 g/dL — ABNORMAL LOW (ref 13.0–17.0)
MCH: 26.2 pg (ref 26.0–34.0)
MCHC: 31.6 g/dL (ref 30.0–36.0)
MCV: 82.9 fL (ref 80.0–100.0)
Platelets: 132 10*3/uL — ABNORMAL LOW (ref 150–400)
RBC: 4.74 MIL/uL (ref 4.22–5.81)
RDW: 21.4 % — ABNORMAL HIGH (ref 11.5–15.5)
WBC: 6.8 10*3/uL (ref 4.0–10.5)
nRBC: 0 % (ref 0.0–0.2)

## 2021-05-18 LAB — RESP PANEL BY RT-PCR (FLU A&B, COVID) ARPGX2
Influenza A by PCR: NEGATIVE
Influenza B by PCR: NEGATIVE
SARS Coronavirus 2 by RT PCR: NEGATIVE

## 2021-05-18 LAB — MAGNESIUM: Magnesium: 1.7 mg/dL (ref 1.7–2.4)

## 2021-05-18 LAB — TROPONIN I (HIGH SENSITIVITY): Troponin I (High Sensitivity): 63 ng/L — ABNORMAL HIGH (ref <18)

## 2021-05-18 LAB — BASIC METABOLIC PANEL
Anion gap: 14 (ref 5–15)
BUN: 17 mg/dL (ref 8–23)
CO2: 21 mmol/L — ABNORMAL LOW (ref 22–32)
Calcium: 9.6 mg/dL (ref 8.9–10.3)
Chloride: 102 mmol/L (ref 98–111)
Creatinine, Ser: 1.03 mg/dL (ref 0.61–1.24)
GFR, Estimated: >60 mL/min (ref >60)
Glucose, Bld: 206 mg/dL — ABNORMAL HIGH (ref 70–99)
Potassium: 4.1 mmol/L (ref 3.5–5.1)
Sodium: 137 mmol/L (ref 135–145)

## 2021-05-18 LAB — GLUCOSE, CAPILLARY: Glucose-Capillary: 159 mg/dL — ABNORMAL HIGH (ref 70–99)

## 2021-05-18 MED ORDER — ACETAMINOPHEN 325 MG PO TABS
650.0000 mg | ORAL_TABLET | ORAL | Status: DC | PRN
  Administered 2021-05-22 (×2): 650 mg via ORAL
  Filled 2021-05-18 (×2): qty 2

## 2021-05-18 MED ORDER — FUROSEMIDE 20 MG PO TABS
20.0000 mg | ORAL_TABLET | Freq: Every day | ORAL | Status: DC
  Administered 2021-05-19 – 2021-05-22 (×4): 20 mg via ORAL
  Filled 2021-05-18 (×4): qty 1

## 2021-05-18 MED ORDER — NITROGLYCERIN 0.4 MG SL SUBL: 0.4000 mg | SUBLINGUAL_TABLET | SUBLINGUAL | Status: DC | PRN

## 2021-05-18 MED ORDER — ASPIRIN EC 81 MG PO TBEC
81.0000 mg | DELAYED_RELEASE_TABLET | Freq: Every day | ORAL | Status: DC
  Administered 2021-05-19 – 2021-05-22 (×4): 81 mg via ORAL
  Filled 2021-05-18 (×4): qty 1

## 2021-05-18 MED ORDER — AMLODIPINE BESYLATE 10 MG PO TABS
10.0000 mg | ORAL_TABLET | Freq: Every day | ORAL | Status: DC
  Administered 2021-05-19 – 2021-05-22 (×4): 10 mg via ORAL
  Filled 2021-05-18 (×4): qty 1

## 2021-05-18 MED ORDER — ATORVASTATIN CALCIUM 40 MG PO TABS
40.0000 mg | ORAL_TABLET | Freq: Every day | ORAL | Status: DC
  Administered 2021-05-18 – 2021-05-21 (×4): 40 mg via ORAL
  Filled 2021-05-18 (×4): qty 1

## 2021-05-18 MED ORDER — ONDANSETRON HCL 4 MG/2ML IJ SOLN: 4.0000 mg | Freq: Four times a day (QID) | INTRAMUSCULAR | Status: DC | PRN

## 2021-05-18 MED ORDER — TAMSULOSIN HCL 0.4 MG PO CAPS
0.4000 mg | ORAL_CAPSULE | Freq: Every day | ORAL | Status: DC
  Administered 2021-05-19 – 2021-05-22 (×4): 0.4 mg via ORAL
  Filled 2021-05-18 (×4): qty 1

## 2021-05-18 MED ORDER — HEPARIN SODIUM (PORCINE) 5000 UNIT/ML IJ SOLN
5000.0000 [IU] | Freq: Three times a day (TID) | INTRAMUSCULAR | Status: DC
  Administered 2021-05-19 – 2021-05-20 (×4): 5000 [IU] via SUBCUTANEOUS
  Filled 2021-05-18 (×4): qty 1

## 2021-05-18 NOTE — H&P (Addendum)
Cardiology Admission History and Physical:   Patient ID: Jordan [name] MRN: [id number]; DOB: Aug 02, 1940   Admission date: 05/18/2021  PCP:  Steele Sizer, MD   [facility] Providers Cardiologist:  Ida Rogue, MD      Chief Complaint:  CHB on [place] monitor  Patient Profile:   Jordan [name] is a 81 y.o. male with hypertension, hyperlipidemia, morbid obesity, CAD, R renal mass, and history of severe aortic stenosis s/p recent TAVR who is being seen 05/18/2021 for the evaluation of complete heart block.  History of Present Illness:   Jordan [name] is a 81 year old male with past medical history of hypertension, hyperlipidemia, morbid obesity, CAD, R renal mass, and history of severe aortic stenosis status post recent TAVR.  Cardiac catheterization performed on 02/15/2021 showed severe single-vessel CAD was CTO of small caliber OM 2 branch, 20% distal LAD lesion, 25% mid left circumflex lesion, 20% ostial RCA lesion, 60% mid RCA lesion.  Severely elevated right heart filling pressure, normal cardiac output and index.  Echocardiogram obtained on 03/15/2021 showed EF 50 to 55%, grade 1 DD, moderate aortic stenosis.  Patient was seen by Dr. Burt Knack on the same day at which time he reported progressive exertional dyspnea with low-level activity.  He was evaluated by multidisciplinary valve team including Dr. Cyndia Bent who felt he has severe symptomatic aortic stenosis and is a suitable candidate for TAVR.  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  He presented for scheduled procedure on 05/14/2021 and underwent successful TAVR with a 29 mm Edwards SAPIEN 3 THV via TF approach.  Intraoperative limited echocardiogram showed 29 mm Edwards SAPIEN valve present in the aortic position with no  paravalvular leakage.  Postprocedure, given the presence of right bundle branch block and first-degree AV block, he was discharged on ZIO monitor.  Cardiology after hour answering service was contacted by heart monitor company regarding brief run of third-degree heart block.  On further questioning, he has had multiple episode of complete heart block since last night.  Majority of which happened during sleep, however there is at least 1 episode occurred prior to sleep.  Talking with the patient, he denies any obvious chest discomfort, shortness of breath, weakness or dizziness.  He has been feeling well since the TAVR procedure.  The case has been discussed between Dr. Gardiner Rhyme, Dr. Sallyanne Kuster and Dr. Curt Bears of EP service, given the recent TAVR procedure, it is concerning that patient might have more episode of third-degree heart block in the future.  We recommended the patient to come to the emergency room and be admitted for observation in the next 2 days.  If he has more episode of third-degree heart block, may need pacemaker implantation on Monday.     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 Burt Knack and Dr. Cyndia Bent   Unspecified constipation     Past Surgical History:  Procedure Laterality Date   cataract surgery     COLONOSCOPY  03/17/2010   COLONOSCOPY N/A 04/22/2019   Procedure: COLONOSCOPY;  Surgeon: Mansouraty, Telford Nab., MD;  Location: [facility];  Service: Gastroenterology;  Laterality: N/A;    EYE SURGERY  03/17/2009   cataract   HERNIA REPAIR     INTRAOPERATIVE TRANSTHORACIC ECHOCARDIOGRAM N/A 05/14/2021   Procedure: INTRAOPERATIVE TRANSTHORACIC ECHOCARDIOGRAM;  Surgeon: Sherren Mocha, MD;  Location: [facility];  Service: Open Heart Surgery;  Laterality: N/A;   RIGHT HEART CATH AND CORONARY ANGIOGRAPHY Bilateral 02/15/2021   Procedure: RIGHT HEART CATH AND CORONARY ANGIOGRAPHY;  Surgeon: Nelva Bush, MD;  Location: [facility];  Service: Cardiovascular;  Laterality: Bilateral;   TRANSCATHETER AORTIC VALVE REPLACEMENT, TRANSFEMORAL N/A 05/14/2021   Procedure: Transcatheter Aortic Valve Replacement, Transfemoral;  Surgeon: Sherren Mocha, MD;  Location: [facility];  Service: Open Heart Surgery;  Laterality: N/A;   UMBILICAL HERNIA REPAIR  03/17/2009     Medications Prior to Admission: Prior to Admission medications   Medication Sig Start Date End Date Taking? Authorizing Provider  ACCU-CHEK AVIVA PLUS test strip TEST TWO TIMES DAILY AS NEEDED AS DIRECTED 09/10/20   Steele Sizer, MD  Accu-Chek FastClix Lancets MISC 1 each by Subdermal route 2 (two) times daily. 11/13/20   Steele Sizer, MD  Alcohol Swabs (B-D SINGLE USE SWABS REGULAR) PADS 1 each by Does not apply route as needed. 04/25/21   Steele Sizer, MD  allopurinol (ZYLOPRIM) 300 MG tablet Take 1 tablet (300 mg total) by mouth daily. 04/17/21   Steele Sizer, MD  amLODipine (NORVASC) 10 MG tablet Take 1 tablet (10 mg total) by mouth daily. 04/17/21   Steele Sizer, MD  aspirin EC 81 MG tablet Take 1 tablet (81 mg total) by mouth daily. Swallow whole. Patient taking differently: Take 81 mg by mouth at bedtime. Swallow whole. 09/23/19   Loel Dubonnet, NP  atorvastatin (LIPITOR) 40 MG tablet Take 1 tablet (40 mg total) by mouth at bedtime. 04/17/21   Steele Sizer, MD  Blood Glucose Calibration (ACCU-CHEK GUIDE CONTROL) LIQD 1 each by In Vitro route as needed. 04/25/21   Steele Sizer, MD   Blood Glucose Monitoring Suppl (ACCU-CHEK AVIVA PLUS) w/Device KIT 1 each by Does not apply route 2 (two) times daily. Use as directed 04/01/19   Steele Sizer, MD  Cholecalciferol (VITAMIN D3 PO) Take 1 tablet by mouth every Monday, Wednesday, and Friday.    [provider]  ferrous sulfate 325 (65 FE) MG tablet Take 325 mg by mouth daily.    [provider]  furosemide (LASIX) 20 MG tablet Take 1 tablet (20 mg total) by mouth daily. 02/15/21 02/15/22  End, Harrell Gave, MD  glipiZIDE (GLUCOTROL XL) 5 MG 24 hr tablet Take 1 tablet (5 mg total) by mouth daily with breakfast. 04/17/21   Ancil Boozer, Drue Stager, MD  latanoprost (XALATAN) 0.005 % ophthalmic solution Place 1 drop into the left eye at bedtime.  06/18/15   [provider]  metFORMIN (GLUCOPHAGE-XR) 750 MG 24 hr tablet Take 2 tablets (1,500 mg total) by mouth daily with breakfast. 04/17/21   Steele Sizer, MD  Omega-3 Fatty Acids (FISH OIL PO) Take 1 capsule by mouth 2 (two) times daily.     [provider]  polyethylene glycol (MIRALAX / GLYCOLAX) packet Take 17 g by  mouth at bedtime.     [provider]  tadalafil (CIALIS) 10 MG tablet Take 1-2 tablets (10-20 mg total) by mouth every three (3) days as needed for erectile dysfunction. 04/17/21   Steele Sizer, MD  tamsulosin (FLOMAX) 0.4 MG CAPS capsule Take 1 capsule (0.4 mg total) by mouth daily. 04/17/21   Steele Sizer, MD     Allergies:    Allergies  Allergen Reactions   Ace Inhibitors Other (See Comments)    angioedema    Social History:   Social History   Socioeconomic History   Marital status: Widowed    Spouse name: Not on file   Number of children: 2   Years of education: Not on file   Highest education level: Some college, no degree  Occupational History   Occupation: retired     Comment: used to [employer]   Tobacco Use   Smoking status: Former    Packs/day: 1.00    Years: 10.00    Pack years: 10.00    Types:  Cigarettes    Start date: 03/17/1965    Quit date: 11/27/1975    Years since quitting: 45.5   Smokeless tobacco: Never   Tobacco comments:    quit 40 years  Vaping Use   Vaping Use: Never used  Substance and Sexual Activity   Alcohol use: Yes    Alcohol/week: 1.0 standard drink    Types: 1 Standard drinks or equivalent per week    Comment: socially - 1 x year   Drug use: No   Sexual activity: Yes    Partners: Female  Other Topics Concern   Not on file  Social History Narrative   Lives by himself in [place]   Two grown children, daughter in [place] and son in [place]   He goes to [place] to visit his sisters often    Social Determinants of Radio broadcast assistant Strain: Low Risk    Difficulty of Paying Living Expenses: Not hard at all  Food Insecurity: No Food Insecurity   Worried About Charity fundraiser in the Last Year: Never true   Arboriculturist in the Last Year: Never true  Transportation Needs: No Transportation Needs   Lack of Transportation (Medical): No   Lack of Transportation (Non-Medical): No  Physical Activity: Sufficiently Active   Days of Exercise per Week: 5 days   Minutes of Exercise per Session: 30 min  Stress: No Stress Concern Present   Feeling of Stress : Not at all  Social Connections: Moderately Integrated   Frequency of Communication with Friends and Family: More than three times a week   Frequency of Social Gatherings with Friends and Family: More than three times a week   Attends Religious Services: More than 4 times per year   Active Member of [employer] or Organizations: Yes   Attends Archivist Meetings: More than 4 times per year   Marital Status: Widowed  Human resources officer Violence: Not At Risk   Fear of Current or Ex-Partner: No   Emotionally Abused: No   Physically Abused: No   Sexually Abused: No    Family History:   The patient's family history includes Diabetes in his brother, sister, and sister; Heart disease in his  father; Hypertension in his father; Prostate cancer in his brother. There is no history of Kidney disease, Kidney cancer, Bladder Cancer, Colon cancer, Esophageal cancer, Inflammatory bowel disease, Liver disease, Pancreatic cancer, Rectal cancer, or Stomach cancer.  ROS:  Please see the history of present illness.  All other ROS reviewed and negative.     Physical Exam/Data:   Vitals:   05/18/21 1516 05/18/21 1518  BP:  (!) 142/41  Pulse:  (!) 56  Resp:  17  Temp:  98.3 F (36.8 C)  TempSrc:  Oral  SpO2:  97%  Weight: 127 kg   Height: _0  (1.753 m)    No intake or output data in the 24 hours ending 05/18/21 1549 Last 3 Weights 05/18/2021 05/15/2021 05/14/2021  Weight (lbs) 280 lb 296 lb 8.3 oz 290 lb  Weight (kg) 127.007 kg 134.5 kg 131.543 kg     Body mass index is 41.35 kg/m.  General:  Well nourished, well developed, in no acute distress HEENT: normal Neck: no JVD Vascular: No carotid bruits; Distal pulses 2+ bilaterally   Cardiac:  normal S1, S2; RRR; no murmur  Lungs:  clear to auscultation bilaterally, no wheezing, rhonchi or rales  Abd: soft, nontender, no hepatomegaly  Ext: no edema Musculoskeletal:  No deformities, BUE and BLE strength normal and equal Skin: warm and dry  Neuro:  CNs 2-12 intact, no focal abnormalities noted Psych:  Normal affect    EKG:  The ECG that was done and was personally reviewed and demonstrates 2-1 AV block  Relevant CV Studies:  Echo 05/15/2021  1. The aortic valve has been replaced by a 29 mm Sapien prosthetic (TAVR)  valve present in the aortic position. No AI/PVL. Effective orifice area,  by VTI measures 4.72 cm. Aortic valve mean gradient measures 4.0 mmHg.  Peak gradient 7 mm Hg. Accerlation   time 67 ms. DVI 0.77.   2. Left ventricular ejection fraction, by estimation, is 60 to 65%. The  left ventricle has normal function. The left ventricle has no regional  wall motion abnormalities. There is mild concentric left  ventricular  hypertrophy. Left ventricular diastolic  parameters are consistent with Grade I diastolic dysfunction (impaired  relaxation).   3. Right ventricular systolic function is normal. The right ventricular  size is normal. Tricuspid regurgitation signal is inadequate for assessing  PA pressure.   4. The mitral valve is grossly normal. No evidence of mitral valve  regurgitation. No evidence of mitral stenosis.   Comparison(s): Stable prosthetic valve. LVEF appears robust in contrast  images (not performed in priors).   Laboratory Data:  High Sensitivity Troponin:  No results for input(s): TROPONINIHS in the last 720 hours.    Chemistry Recent Labs  Lab 05/14/21 1554 05/15/21 0154  NA 141 137  K 4.0 4.4  CL 106 105  CO2  --  24  GLUCOSE 178* 231*  BUN 13 15  CREATININE 0.80 0.97  CALCIUM  --  9.3  MG  --  1.8  GFRNONAA  --  >60  ANIONGAP  --  8    No results for input(s): PROT, ALBUMIN, AST, ALT, ALKPHOS, BILITOT in the last 168 hours. Lipids No results for input(s): CHOL, TRIG, HDL, LABVLDL, LDLCALC, CHOLHDL in the last 168 hours. Hematology Recent Labs  Lab 05/14/21 1554 05/15/21 0154  WBC  --  9.6  RBC  --  4.38  HGB 12.6* 11.3*  HCT 37.0* 36.7*  MCV  --  83.8  MCH  --  25.8*  MCHC  --  30.8  RDW  --  21.0*  PLT  --  163   Thyroid No results for input(s): TSH, FREET4 in the last 168 hours. BNPNo results for  input(s): BNP, PROBNP in the last 168 hours.  DDimer No results for input(s): DDIMER in the last 168 hours.   Radiology/Studies:  No results found.   Assessment and Plan:   Intermittent complete heart block  -Seen on ZIO monitor after TAVR procedure.  While majority of which occurred during sleep, 1 episode occurred prior to sleep.  Baseline heart rate remained close to 50 bpm while in complete heart block.  -When patient arrived in the emergency room, EKG in 2-1 AV block.  Not on AV nodal blocking agent at home.  -We will continue to monitor  the patient on telemetry, d/w EP and likely plan for PPM on Monday  Severe aortic stenosis s/p TAVR 05/14/2021  CAD: Last cardiac catheterization in December 2022 revealed CTO of small caliber OM 2 branch, otherwise nonobstructive disease.  Medical therapy was recommended.  Patient denies any recent chest pain.  Hypertension: Continue home medication.  Not on AV nodal blocking agent.  Hyperlipidemia  Morbid obesity  Right renal mass: seen on CT scan in Jan, referred to urology service as outpatient.    Risk Assessment/Risk Scores:            Severity of Illness: The appropriate patient status for this patient is INPATIENT. Inpatient status is judged to be reasonable and necessary in order to provide the required intensity of service to ensure the patient's safety. The patient's presenting symptoms, physical exam findings, and initial radiographic and laboratory data in the context of their chronic comorbidities is felt to place them at high risk for further clinical deterioration. Furthermore, it is not anticipated that the patient will be medically stable for discharge from the hospital within 2 midnights of admission.   * I certify that at the point of admission it is my clinical judgment that the patient will require inpatient hospital care spanning beyond 2 midnights from the point of admission due to high intensity of service, high risk for further deterioration and high frequency of surveillance required.*   For questions or updates, please contact [place] Please consult [email or web address] for contact info under     Hilbert Corrigan, [place]  05/18/2021 3:49 PM   Patient seen and examined.  Agree with above documentation.  Mr. Mclaren is a 81 year old male with a history of severe aortic stenosis status post recent TAVR, right renal mass, CAD, morbid obesity, hypertension who presents for evaluation of complete heart block.  He had cardiac catheterization on 02/15/2021 which showed  CTO of small OM 2, 60% mid RCA, otherwise mild nonobstructive CAD.  He underwent TAVR on 05/14/2021 with placement of a 29 mm SAPIEN 3 valve.  Given his baseline right bundle branch block and first-degree AV block, he was at high risk of developing advanced heart block and was discharged with live ZIO monitor.  We were called this morning as patient had multiple episodes of complete heart block overnight.  Episodes were intermittent, occurred both while awake and asleep, lasted total 6 minutes.  Appear to have good junctional escape rhythm, with rate in 40s to 50s.  He denied any symptoms.  Discussed with Dr. Curt Bears in EP, and recommended likely [facility] Monday.  On presentation to the ED, initial vital signs notable for BP 142/41, pulse 56, SPO2 97% on room air.  Labs notable for creatinine 1.0, potassium 4.1, sodium 137, troponin 63, hemoglobin 12.4, platelets 132.  Chest x-ray shows no acute cardiopulmonary disease.  EKG shows 2:1 AV block, rate 55.  Telemetry shows 2-1 AV  block, rates 40s to 50s.  He remains asymptomatic, denies any chest pain, dyspnea, lightheadedness, syncope.  On exam, patient is alert and oriented, bradycardic, regular, 2 out of 6 systolic murmur, lungs CTAB, no LE edema or JVD.  We will admit to cardiology and plan EP evaluation for PPM.  I discussed this with Dr. Curt Bears, reports they will see Monday morning and plan for likely PPM on Monday.  He appears hemodynamically stable and asymptomatic.  We will continue to monitor on telemetry.  Donato Heinz, MD

## 2021-05-18 NOTE — ED Provider Notes (Signed)
[place] EMERGENCY DEPARTMENT Provider Note   CSN: [id number] Arrival date & time: 05/18/21  1454     History  No chief complaint on file.   Jordan [name] is a 81 y.o. male presenting to emergency department with concern for possible high degree heart block.  The patient was referred into the ER for medical admission by the cardiology service with concern that he Zio monitor over the past 2 days showed a possible high degree heart block.  This is per my review of the external records, Hao meng PA's note today:  "have reviewed the strips with Dr. Gardiner Rhyme who discussed the case with both Dr. Sallyanne Kuster and Dr. Curt Bears of the electrophysiology service.  Given the recent TAVR procedure, our concern is his episodes of complete heart block will become more and more prolonged.  Even though he is asymptomatic at this point however it is important for [name] to monitor him closely to see if he has further episodes.  I have called the patient and instructed him to come to [facility] emergency room.  We plan to admit him to the cardiology service.  He likely will be monitored on telemetry floor and potentially get a pacemaker on Monday."  Patient is currently asymptomatic.  He denies any issues with palpitations, lightheadedness, weakness, chest pressure pain, nausea or vomiting over the past 2 to 3 days  HPI     Home Medications Prior to Admission medications   Medication Sig Start Date End Date Taking? Authorizing Provider  ACCU-CHEK AVIVA PLUS test strip TEST TWO TIMES DAILY AS NEEDED AS DIRECTED 09/10/20   Steele Sizer, MD  Accu-Chek FastClix Lancets MISC 1 each by Subdermal route 2 (two) times daily. 11/13/20   Steele Sizer, MD  Alcohol Swabs (B-D SINGLE USE SWABS REGULAR) PADS 1 each by Does not apply route as needed. 04/25/21   Steele Sizer, MD  allopurinol (ZYLOPRIM) 300 MG tablet Take 1 tablet (300 mg total) by mouth daily. 04/17/21   Steele Sizer, MD   amLODipine (NORVASC) 10 MG tablet Take 1 tablet (10 mg total) by mouth daily. 04/17/21   Steele Sizer, MD  aspirin EC 81 MG tablet Take 1 tablet (81 mg total) by mouth daily. Swallow whole. Patient taking differently: Take 81 mg by mouth at bedtime. Swallow whole. 09/23/19   Loel Dubonnet, NP  atorvastatin (LIPITOR) 40 MG tablet Take 1 tablet (40 mg total) by mouth at bedtime. 04/17/21   Steele Sizer, MD  Blood Glucose Calibration (ACCU-CHEK GUIDE CONTROL) LIQD 1 each by In Vitro route as needed. 04/25/21   Steele Sizer, MD  Blood Glucose Monitoring Suppl (ACCU-CHEK AVIVA PLUS) w/Device KIT 1 each by Does not apply route 2 (two) times daily. Use as directed 04/01/19   Steele Sizer, MD  Cholecalciferol (VITAMIN D3 PO) Take 1 tablet by mouth every Monday, Wednesday, and Friday.    [provider]  ferrous sulfate 325 (65 FE) MG tablet Take 325 mg by mouth daily.    [provider]  furosemide (LASIX) 20 MG tablet Take 1 tablet (20 mg total) by mouth daily. 02/15/21 02/15/22  End, Harrell Gave, MD  glipiZIDE (GLUCOTROL XL) 5 MG 24 hr tablet Take 1 tablet (5 mg total) by mouth daily with breakfast. 04/17/21   Ancil Boozer, Drue Stager, MD  latanoprost (XALATAN) 0.005 % ophthalmic solution Place 1 drop into the left eye at bedtime.  06/18/15   [provider]  metFORMIN (GLUCOPHAGE-XR) 750 MG 24 hr tablet Take  2 tablets (1,500 mg total) by mouth daily with breakfast. 04/17/21   Steele Sizer, MD  Omega-3 Fatty Acids (FISH OIL PO) Take 1 capsule by mouth 2 (two) times daily.     [provider]  polyethylene glycol (MIRALAX / GLYCOLAX) packet Take 17 g by mouth at bedtime.     [provider]  tadalafil (CIALIS) 10 MG tablet Take 1-2 tablets (10-20 mg total) by mouth every three (3) days as needed for erectile dysfunction. 04/17/21   Steele Sizer, MD  tamsulosin (FLOMAX) 0.4 MG CAPS capsule Take 1 capsule (0.4 mg total) by mouth daily. 04/17/21   Steele Sizer, MD       Allergies    Ace inhibitors    Review of Systems   Review of Systems  Physical Exam Updated Vital Signs BP (!) 142/41 (BP Location: Left Arm)    Pulse 96    Temp 98.3 F (36.8 C) (Oral)    Resp 18    Ht '5\' 9"'  (1.753 m)    Wt 127 kg    SpO2 97%    BMI 41.35 kg/m  Physical Exam Constitutional:      General: He is not in acute distress.    Appearance: He is obese.  HENT:     Head: Normocephalic and atraumatic.  Eyes:     Conjunctiva/sclera: Conjunctivae normal.     Pupils: Pupils are equal, round, and reactive to light.  Cardiovascular:     Rate and Rhythm: Regular rhythm. Bradycardia present.     Pulses: Normal pulses.  Pulmonary:     Effort: Pulmonary effort is normal. No respiratory distress.  Abdominal:     General: There is no distension.     Tenderness: There is no abdominal tenderness.  Skin:    General: Skin is warm and dry.  Neurological:     General: No focal deficit present.     Mental Status: He is alert. Mental status is at baseline.  Psychiatric:        Mood and Affect: Mood normal.        Behavior: Behavior normal.    ED Results / Procedures / Treatments   Labs (all labs ordered are listed, but only abnormal results are displayed) Labs Reviewed  BASIC METABOLIC PANEL - Abnormal; Notable for the following components:      Result Value   CO2 21 (*)    Glucose, Bld 206 (*)    All other components within normal limits  CBC - Abnormal; Notable for the following components:   Hemoglobin 12.4 (*)    RDW 21.4 (*)    Platelets 132 (*)    All other components within normal limits  TROPONIN I (HIGH SENSITIVITY) - Abnormal; Notable for the following components:   Troponin I (High Sensitivity) 63 (*)    All other components within normal limits  RESP PANEL BY RT-PCR (FLU A&B, COVID) ARPGX2  MAGNESIUM  CBC  CREATININE, SERUM  BASIC METABOLIC PANEL    EKG EKG Interpretation  Date/Time:  Saturday May 18 2021 15:12:56 EST Ventricular Rate:  55 PR  Interval:  394 QRS Duration: 114 QT Interval:  446 QTC Calculation: 426 R Axis:   61 Text Interpretation: Sinus bradycardia with heart block pattern noted Incomplete right bundle branch block Nonspecific ST abnormality Abnormal ECG When compared with ECG of 14-May-2021 15:45, PREVIOUS ECG IS PRESENT Confirmed by Octaviano Glow [phone]) on 05/18/2021 4:33:59 PM  Radiology DG Chest 2 View  Result Date: 05/18/2021 CLINICAL DATA:  Pt states he has been wearing a heart monitor since 2/28 and received a call today to come to ED so that his heart could be monitored better. Denies chest pain, sob, dizziness or any other complaints Nonsmoker, diabetic Hx of aortic stenosis, heart murmur, HTN, S/P TAVR EXAM: CHEST - 2 VIEW COMPARISON:  05/10/2021 and older studies. FINDINGS: Cardiac silhouette is normal in size. Stable prosthetic aortic valve. No mediastinal or hilar masses. No evidence of adenopathy. Eventrated anterior right hemidiaphragm, stable. Clear lungs.  No pleural effusion or pneumothorax. Skeletal structures are intact. IMPRESSION: No active cardiopulmonary disease. Electronically Signed   By: Lajean Manes M.D.   On: 05/18/2021 15:48    Procedures Procedures    Medications Ordered in ED Medications  aspirin EC tablet 81 mg (has no administration in time range)  amLODipine (NORVASC) tablet 10 mg (has no administration in time range)  atorvastatin (LIPITOR) tablet 40 mg (has no administration in time range)  furosemide (LASIX) tablet 20 mg (has no administration in time range)  tamsulosin (FLOMAX) capsule 0.4 mg (has no administration in time range)  nitroGLYCERIN (NITROSTAT) SL tablet 0.4 mg (has no administration in time range)  acetaminophen (TYLENOL) tablet 650 mg (has no administration in time range)  ondansetron (ZOFRAN) injection 4 mg (has no administration in time range)  heparin injection 5,000 Units (has no administration in time range)    ED Course/ Medical Decision Making/  A&P Clinical Course as of 05/18/21 1737  Sat May 18, 2021  1731 Admitted by cardiology service [MT]    Clinical Course User Index [MT] Langston Masker Carola Rhine, MD                           Medical Decision Making Amount and/or Complexity of Data Reviewed Labs: ordered. Radiology: ordered.  Risk Decision regarding hospitalization.   Pt here with concern for arrhythmia, high grade heart block per my review of external records/notes from today.  He has a Building surveyor.  On arrival here in the ED he is asymptomatic.  I personally reviewed and interpreted his EKG and his telemetry monitor.  It is not clear to me whether this is a first-degree heart block with consistent prolonged PR interval, or possibly a third-degree heart block as there may be P waves evenly marching out next to every T wave.    I placed a consult to cardiology for admission as per their initial recommendation.  I reviewed and interpreted the patient's labs.  Troponin is very mildly elevated at 66 but he does not have acute ischemic findings on EKG and no chest pain or pressure.  Second troponin is pending.  Potassium is within normal limits.  I also reviewed and interpreted the patient's chest x-ray shows no focal infiltrate        Final Clinical Impression(s) / ED Diagnoses Final diagnoses:  Cardiac arrhythmia, unspecified cardiac arrhythmia type    Rx / DC Orders ED Discharge Orders     None         Wyvonnia Dusky, MD 05/18/21 1737

## 2021-05-18 NOTE — Telephone Encounter (Signed)
[facility] called back, they were able to locate the patient in their system. ? ?According to the wrap, several episodes of irregular rhythm has been documented. ? ?First documentation was 9:12 PM until 9:33 PM in the evening of 05/17/2021, it was unclear if the patient was in complete heart block the entire time as only 90 seconds strips were available intermittently.  ? ?Second episode from 11:06 PM until 11:13 PM on 05/17/2021 with CHB ? ?Third episode and 3:34 AM in the morning of 05/18/2021. [employer] called the patient who stated that he was asleep and was asymptomatic.  Around 4 AM this morning, patient had a Mobitz 2 heart block.  ? ?I have instructed the [employer] rep to forward strips to the ordering provider's office and also to [facility] for me to review. ? ?Awaiting strips ?

## 2021-05-18 NOTE — ED Triage Notes (Signed)
Pt states he has been wearing a heart monitor since 2/28 and received a call today to come to ED so that his heart could be monitored better.  Denies chest pain, sob, dizziness or any other complaints.  States he feels great. ?

## 2021-05-18 NOTE — Telephone Encounter (Signed)
I have reviewed the strips with Dr. Gardiner Rhyme who discussed the case with both Dr. Sallyanne Kuster and Dr. Curt Bears of the electrophysiology service.  Given the recent TAVR procedure, our concern is his episodes of complete heart block will become more and more prolonged.  Even though he is asymptomatic at this point however it is important for [name] to monitor him closely to see if he has further episodes.  I have called the patient and instructed him to come to [facility] emergency room.  We plan to admit him to the cardiology service.  He likely will be monitored on telemetry floor and potentially get a pacemaker on Monday. ?

## 2021-05-18 NOTE — Telephone Encounter (Signed)
Per overnight fellow, patient's heart monitor showed a 20 seconds Complete heart block, he was reportedly asymptomatic. No additional information available. Does not appears fellow left a note.  ? ?I called back to [facility] ([phone]), [employer] rep had a hard time locating the patient in their system, suspect that alarm ticket has been closed. He is going to reach out to their lead technician to see if he can locate the ticket and abnormal rhythm on record. I left them my cell number.  ? ?Need to know: ?The time of event - was the patient asleep, (per report asymptomatic) ?Need the strip ?Suspect the monitor is with live recording ? ?If recurrence while the patient is awake, will need urgent referral to EP. Patient recently underwent TAVR.  ?

## 2021-05-19 LAB — BASIC METABOLIC PANEL
Anion gap: 9 (ref 5–15)
BUN: 17 mg/dL (ref 8–23)
CO2: 25 mmol/L (ref 22–32)
Calcium: 9.6 mg/dL (ref 8.9–10.3)
Chloride: 102 mmol/L (ref 98–111)
Creatinine, Ser: 1.05 mg/dL (ref 0.61–1.24)
GFR, Estimated: >60 mL/min (ref >60)
Glucose, Bld: 246 mg/dL — ABNORMAL HIGH (ref 70–99)
Potassium: 3.6 mmol/L (ref 3.5–5.1)
Sodium: 136 mmol/L (ref 135–145)

## 2021-05-19 LAB — GLUCOSE, CAPILLARY
Glucose-Capillary: 219 mg/dL — ABNORMAL HIGH (ref 70–99)
Glucose-Capillary: 153 mg/dL — ABNORMAL HIGH (ref 70–99)
Glucose-Capillary: 122 mg/dL — ABNORMAL HIGH (ref 70–99)
Glucose-Capillary: 196 mg/dL — ABNORMAL HIGH (ref 70–99)

## 2021-05-19 MED ORDER — INSULIN ASPART 100 UNIT/ML IJ SOLN
0.0000 [IU] | Freq: Three times a day (TID) | INTRAMUSCULAR | Status: DC
  Administered 2021-05-19: 5 [IU] via SUBCUTANEOUS
  Administered 2021-05-19: 2 [IU] via SUBCUTANEOUS
  Administered 2021-05-20: 5 [IU] via SUBCUTANEOUS
  Administered 2021-05-20: 3 [IU] via SUBCUTANEOUS
  Administered 2021-05-21: 2 [IU] via SUBCUTANEOUS
  Administered 2021-05-21: 3 [IU] via SUBCUTANEOUS
  Administered 2021-05-22: 5 [IU] via SUBCUTANEOUS
  Administered 2021-05-22: 3 [IU] via SUBCUTANEOUS

## 2021-05-19 MED ORDER — INSULIN ASPART 100 UNIT/ML IJ SOLN
0.0000 [IU] | Freq: Every day | INTRAMUSCULAR | Status: DC
  Administered 2021-05-20 – 2021-05-21 (×2): 2 [IU] via SUBCUTANEOUS

## 2021-05-19 NOTE — Progress Notes (Addendum)
? ?Progress Note ? ?Patient Name: Jordan [name] ?Date of Encounter: 05/19/2021 ? ?[facility] Cardiologist: Ida Rogue, MD  ? ?Subjective  ? ?Denies any chest pain, dyspnea, or lightheadedness ? ?Inpatient Medications  ?  ?Scheduled Meds: ? amLODipine  10 mg Oral Daily  ? aspirin EC  81 mg Oral Daily  ? atorvastatin  40 mg Oral QHS  ? furosemide  20 mg Oral Daily  ? heparin  5,000 Units Subcutaneous Q8H  ? insulin aspart  0-15 Units Subcutaneous TID WC  ? insulin aspart  0-5 Units Subcutaneous QHS  ? tamsulosin  0.4 mg Oral Daily  ? ?Continuous Infusions: ? ?PRN Meds: ?acetaminophen, nitroGLYCERIN, ondansetron (ZOFRAN) IV  ? ?Vital Signs  ?  ?Vitals:  ? 05/19/21 0616 05/19/21 0751 05/19/21 0954 05/19/21 1107  ?BP: 98/80 (!) 139/52 (!) 158/57 (!) 150/55  ?Pulse: (!) 49 (!) 45  (!) 47  ?Resp: '17 18  17  '$ ?Temp: 98.6 ?F (37 ?C) 98.5 ?F (36.9 ?C)  98.7 ?F (37.1 ?C)  ?TempSrc: Oral Oral  Oral  ?SpO2: 99% 98%  100%  ?Weight:      ?Height:      ? ? ?Intake/Output Summary (Last 24 hours) at 05/19/2021 1134 ?Last data filed at 05/19/2021 0957 ?Gross per 24 hour  ?Intake 540 ml  ?Output 400 ml  ?Net 140 ml  ? ?Last 3 Weights 05/18/2021 05/18/2021 05/15/2021  ?Weight (lbs) 293 lb 12.8 oz 280 lb 296 lb 8.3 oz  ?Weight (kg) 133.267 kg 127.007 kg 134.5 kg  ?   ? ?Telemetry  ?  ?2-1 AV block with rate 40s- Personally Reviewed ? ?ECG  ?  ?No new ECG- Personally Reviewed ? ?Physical Exam  ? ?GEN: No acute distress.   ?Neck: No JVD ?Cardiac: RRR, 2 out of 6 systolic murmur ?Respiratory: Clear to auscultation bilaterally. ?GI: Soft, nontender, non-distended  ?MS: No edema; No deformity. ?Neuro:  Nonfocal  ?Psych: Normal affect  ? ?Labs  ?  ?High Sensitivity Troponin:   ?Recent Labs  ?Lab 05/18/21 ?1524  ?TROPONINIHS 63*  ?   ?Chemistry ?Recent Labs  ?Lab 05/15/21 ?0154 05/18/21 ?1524 05/19/21 ?0119  ?NA 137 137 136  ?K 4.4 4.1 3.6  ?CL 105 102 102  ?CO2 24 21* 25  ?GLUCOSE 231* 206* 246*  ?BUN '15 17 17  '$ ?CREATININE 0.97 1.03 1.05   ?CALCIUM 9.3 9.6 9.6  ?MG 1.8 1.7  --   ?GFRNONAA >60 >60 >60  ?ANIONGAP '8 14 9  '$ ?  ?Lipids No results for input(s): CHOL, TRIG, HDL, LABVLDL, LDLCALC, CHOLHDL in the last 168 hours.  ?Hematology ?Recent Labs  ?Lab 05/14/21 ?1554 05/15/21 ?0154 05/18/21 ?1524  ?WBC  --  9.6 6.8  ?RBC  --  4.38 4.74  ?HGB 12.6* 11.3* 12.4*  ?HCT 37.0* 36.7* 39.3  ?MCV  --  83.8 82.9  ?MCH  --  25.8* 26.2  ?MCHC  --  30.8 31.6  ?RDW  --  21.0* 21.4*  ?PLT  --  163 132*  ? ?Thyroid No results for input(s): TSH, FREET4 in the last 168 hours.  ?BNPNo results for input(s): BNP, PROBNP in the last 168 hours.  ?DDimer No results for input(s): DDIMER in the last 168 hours.  ? ?Radiology  ?  ?DG Chest 2 View ? ?Result Date: 05/18/2021 ?CLINICAL DATA:  Pt states he has been wearing a heart monitor since 2/28 and received a call today to come to ED so that his heart could be monitored better.  Denies chest pain, sob, dizziness or any other complaints Nonsmoker, diabetic Hx of aortic stenosis, heart murmur, HTN, S/P TAVR EXAM: CHEST - 2 VIEW COMPARISON:  05/10/2021 and older studies. FINDINGS: Cardiac silhouette is normal in size. Stable prosthetic aortic valve. No mediastinal or hilar masses. No evidence of adenopathy. Eventrated anterior right hemidiaphragm, stable. Clear lungs.  No pleural effusion or pneumothorax. Skeletal structures are intact. IMPRESSION: No active cardiopulmonary disease. Electronically Signed   By: Lajean Manes M.D.   On: 05/18/2021 15:48   ? ?Cardiac Studies  ? ?Echo 05/15/2021 ? 1. The aortic valve has been replaced by a 29 mm Sapien prosthetic (TAVR)  ?valve present in the aortic position. No AI/PVL. Effective orifice area,  ?by VTI measures 4.72 cm?Marland Kitchen Aortic valve mean gradient measures 4.0 mmHg.  ?Peak gradient 7 mm Hg. Accerlation  ? time 67 ms. DVI 0.77.  ? 2. Left ventricular ejection fraction, by estimation, is 60 to 65%. The  ?left ventricle has normal function. The left ventricle has no regional  ?wall motion  abnormalities. There is mild concentric left ventricular  ?hypertrophy. Left ventricular diastolic  ?parameters are consistent with Grade I diastolic dysfunction (impaired  ?relaxation).  ? 3. Right ventricular systolic function is normal. The right ventricular  ?size is normal. Tricuspid regurgitation signal is inadequate for assessing  ?PA pressure.  ? 4. The mitral valve is grossly normal. No evidence of mitral valve  ?regurgitation. No evidence of mitral stenosis.  ? ?Patient Profile  ?   ?81 y.o. male  with hypertension, hyperlipidemia, morbid obesity, CAD, R renal mass, and history of severe aortic stenosis s/p recent TAVR who awas admitted for CHB ? ?Assessment & Plan  ?  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  Found to have intermittent complete heart block on monitor, was advised to come to the ED.  On presentation to ED was in 2:1 AV block with rate 40s.  Not on AV nodal blocking agents.  He is asymptomatic. ?-Discussed with Dr. Curt Bears in EP, will make n.p.o. at [facility] for PPM evaluation tomorrow ? 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  ?CAD: Last cardiac catheterization in December 2022 revealed CTO of small caliber OM 2 branch, otherwise nonobstructive disease.  Medical therapy was recommended.  Patient denies any recent chest pain.  Continue aspirin, statin ? ?Hypertension: Continue home amlodipine 10 mg daily ?  ?Hyperlipidemia: Continue atorvastatin 40 mg daily ?  ?Morbid obesity: Body mass index is 43.39 kg/m?. ?  ?Right renal mass: seen on CT scan in Jan, referred to urology service as outpatient.  ? ?T2DM: on SSI ? ? ?Diet heart healthy/carb modified ?DVT PPx: SQ heparin ?Code: Full ? ? ?For questions or updates, please contact [place] ?Please consult [email or web address] for contact info under  ? ?   ?Signed, ?Donato Heinz, MD  ?05/19/2021, 11:34 AM   ? ?

## 2021-05-20 ENCOUNTER — Ambulatory Visit: Payer: Medicare HMO

## 2021-05-20 LAB — BASIC METABOLIC PANEL
Anion gap: 9 (ref 5–15)
BUN: 19 mg/dL (ref 8–23)
CO2: 24 mmol/L (ref 22–32)
Calcium: 9.3 mg/dL (ref 8.9–10.3)
Chloride: 103 mmol/L (ref 98–111)
Creatinine, Ser: 1.02 mg/dL (ref 0.61–1.24)
GFR, Estimated: >60 mL/min (ref >60)
Glucose, Bld: 265 mg/dL — ABNORMAL HIGH (ref 70–99)
Potassium: 4.2 mmol/L (ref 3.5–5.1)
Sodium: 136 mmol/L (ref 135–145)

## 2021-05-20 LAB — CBC
HCT: 35.5 % — ABNORMAL LOW (ref 39.0–52.0)
Hemoglobin: 11.6 g/dL — ABNORMAL LOW (ref 13.0–17.0)
MCH: 26.7 pg (ref 26.0–34.0)
MCHC: 32.7 g/dL (ref 30.0–36.0)
MCV: 81.8 fL (ref 80.0–100.0)
Platelets: 124 10*3/uL — ABNORMAL LOW (ref 150–400)
RBC: 4.34 MIL/uL (ref 4.22–5.81)
RDW: 21.1 % — ABNORMAL HIGH (ref 11.5–15.5)
WBC: 5.3 10*3/uL (ref 4.0–10.5)
nRBC: 0 % (ref 0.0–0.2)

## 2021-05-20 LAB — PROTIME-INR
INR: 1.1 (ref 0.8–1.2)
Prothrombin Time: 13.8 seconds (ref 11.4–15.2)

## 2021-05-20 LAB — GLUCOSE, CAPILLARY
Glucose-Capillary: 204 mg/dL — ABNORMAL HIGH (ref 70–99)
Glucose-Capillary: 162 mg/dL — ABNORMAL HIGH (ref 70–99)
Glucose-Capillary: 120 mg/dL — ABNORMAL HIGH (ref 70–99)
Glucose-Capillary: 238 mg/dL — ABNORMAL HIGH (ref 70–99)

## 2021-05-20 LAB — SURGICAL PCR SCREEN
MRSA, PCR: NEGATIVE
Staphylococcus aureus: NEGATIVE

## 2021-05-20 MED ORDER — CEFAZOLIN IN SODIUM CHLORIDE 3-0.9 GM/100ML-% IV SOLN
3.0000 g | INTRAVENOUS | Status: AC
  Filled 2021-05-20 (×2): qty 100

## 2021-05-20 MED ORDER — CHLORHEXIDINE GLUCONATE 4 % EX LIQD
60.0000 mL | Freq: Once | CUTANEOUS | Status: DC
  Filled 2021-05-20: qty 60

## 2021-05-20 MED ORDER — SODIUM CHLORIDE 0.9 % IV SOLN: INTRAVENOUS | Status: DC

## 2021-05-20 MED ORDER — SODIUM CHLORIDE 0.9 % IV SOLN
80.0000 mg | INTRAVENOUS | Status: AC
  Filled 2021-05-20: qty 2

## 2021-05-20 NOTE — Progress Notes (Signed)
?  Transition of Care (TOC) Screening Note ? ? ?Patient Details  ?Name: Jordan [name] ?Date of Birth: Jul 21, 1940 ? ? ?Transition of Care (TOC) CM/SW Contact:    ?Milas Gain, LCSWA ?Phone Number: ?05/20/2021, 12:55 PM ? ? ? ?Transition of Care Department [facility]) has reviewed patient and no TOC needs have been identified at this time. We will continue to monitor patient advancement through interdisciplinary progression rounds. If new patient transition needs arise, please place a TOC consult. ?  ?

## 2021-05-20 NOTE — Progress Notes (Signed)
?  Due to urgent case we will be unable to pace this afternoon.  ? ?Interestingly, his conduction has been normal since approx 11 am. With daytime CHB though, it is unlikely he will be able to avoid pacing.  ? ?Clear diet at midnight for pacing tentatively tomorrow.  ? ?Jordan Como "Jonni Sanger" Chalmers Cater, PA-C  ?05/20/2021 3:30 PM  ?

## 2021-05-20 NOTE — Consult Note (Signed)
ELECTROPHYSIOLOGY CONSULT NOTE    Patient ID: Jordan [name] MRN: [id number], DOB/AGE: 09/23/1940 81 y.o.  Admit date: 05/18/2021 Date of Consult: 05/20/2021  Primary Physician: Steele Sizer, MD Primary Cardiologist: Ida Rogue, MD  Electrophysiologist:  New  Referring Provider: Dr. Gardiner Rhyme  Patient Profile: Jordan [name] is a 81 y.o. male with a history of hypertension, hyperlipidemia, morbid obesity, CAD, R renal mass, and history of severe aortic stenosis s/p recent TAVR who awas admitted for CHB and who is being seen today for the evaluation of advanced AV block at the request of Dr. Gardiner Rhyme.  HPI:  Jordan [name] is a 81 y.o. male with medical history as above.   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  Found to have intermittent complete heart block on monitor, was advised to come to the ED. On presentation to ED was in 2:1 AV block with rate 40s.  Not on AV nodal blocking agents  Last cardiac catheterization in December 2022 revealed CTO of small caliber OM 2 branch, otherwise nonobstructive disease.  Medical therapy was recommended.  Patient denies any recent chest pain.  Remains on ASA and statin.   This morning pt is feeling well in bed.  He states he did have various episodes of lightheadedness/dizziness since being discharged. He has had mild fatigue, but attributed it to post op recovery, and didn't notice a specific worsening or improvement. No syncope, nausea, vomiting, SOB, or falls.   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 Burt Knack and Dr. Cyndia Bent   Unspecified constipation      Surgical History:  Past Surgical History:  Procedure Laterality Date   cataract surgery     COLONOSCOPY  03/17/2010   COLONOSCOPY N/A 04/22/2019   Procedure: COLONOSCOPY;  Surgeon: Irving Copas., MD;  Location: [facility];  Service: Gastroenterology;  Laterality: N/A;   EYE SURGERY  03/17/2009   cataract   HERNIA REPAIR     INTRAOPERATIVE TRANSTHORACIC ECHOCARDIOGRAM N/A 05/14/2021   Procedure: INTRAOPERATIVE TRANSTHORACIC ECHOCARDIOGRAM;  Surgeon: Sherren Mocha, MD;  Location: [facility];  Service: Open Heart Surgery;  Laterality: N/A;   RIGHT HEART CATH AND CORONARY ANGIOGRAPHY Bilateral 02/15/2021   Procedure: RIGHT HEART CATH AND CORONARY ANGIOGRAPHY;  Surgeon: Nelva Bush, MD;  Location: [facility];  Service: Cardiovascular;  Laterality: Bilateral;   TRANSCATHETER AORTIC VALVE REPLACEMENT, TRANSFEMORAL N/A 05/14/2021   Procedure: Transcatheter Aortic Valve Replacement, Transfemoral;  Surgeon: Sherren Mocha, MD;  Location: [facility];  Service: Open Heart Surgery;  Laterality: N/A;   UMBILICAL HERNIA REPAIR  03/17/2009     Medications Prior to Admission  Medication Sig Dispense Refill Last Dose   ACCU-CHEK AVIVA PLUS test strip TEST TWO TIMES DAILY AS NEEDED AS DIRECTED (Patient taking differently: 1 each by  Other route 2 (two) times daily as needed (blood glucose).) 200 strip 2 Past Week   Accu-Chek FastClix Lancets MISC 1 each by Subdermal route 2 (two) times daily. 102 each 2 Past Week   Alcohol Swabs (B-D SINGLE USE SWABS REGULAR) PADS 1 each by Does not apply route as needed. 100 each 3 Past Week   allopurinol (ZYLOPRIM) 300 MG tablet Take 1 tablet (300 mg total) by mouth daily. 90 tablet 1 05/18/2021   amLODipine  (NORVASC) 10 MG tablet Take 1 tablet (10 mg total) by mouth daily. 90 tablet 1 05/18/2021   aspirin EC 81 MG tablet Take 1 tablet (81 mg total) by mouth daily. Swallow whole. (Patient taking differently: Take 81 mg by mouth at bedtime. Swallow whole.)   Past Week   atorvastatin (LIPITOR) 40 MG tablet Take 1 tablet (40 mg total) by mouth at bedtime. (Patient taking differently: Take 40 mg by mouth at bedtime.) 90 tablet 1 05/18/2021   Blood Glucose Calibration (ACCU-CHEK GUIDE CONTROL) LIQD 1 each by In Vitro route as needed. (Patient taking differently: 1 each by In Vitro route as needed (blood glucose).) 1 each 3 Past Week   Blood Glucose Monitoring Suppl (ACCU-CHEK AVIVA PLUS) w/Device KIT 1 each by Does not apply route 2 (two) times daily. Use as directed 100 kit 12 Past Week   Cholecalciferol (VITAMIN D3 PO) Take 1 tablet by mouth every Monday, Wednesday, and Friday.   Past Week   ferrous sulfate 325 (65 FE) MG tablet Take 325 mg by mouth daily.   05/18/2021   furosemide (LASIX) 20 MG tablet Take 1 tablet (20 mg total) by mouth daily. 30 tablet 5 05/18/2021   glipiZIDE (GLUCOTROL XL) 5 MG 24 hr tablet Take 1 tablet (5 mg total) by mouth daily with breakfast. 90 tablet 1 05/18/2021   latanoprost (XALATAN) 0.005 % ophthalmic solution Place 1 drop into the left eye at bedtime.    Past Week   metFORMIN (GLUCOPHAGE-XR) 750 MG 24 hr tablet Take 2 tablets (1,500 mg total) by mouth daily with breakfast. 180 tablet 1 05/18/2021   Omega-3 Fatty Acids (FISH OIL PO) Take 1 capsule by mouth 2 (two) times daily.    05/18/2021   polyethylene glycol (MIRALAX / GLYCOLAX) packet Take 17 g by mouth at bedtime.    Past Week   tadalafil (CIALIS) 10 MG tablet Take 1-2 tablets (10-20 mg total) by mouth every three (3) days as needed for erectile dysfunction. 30 tablet 0 unk   tamsulosin (FLOMAX) 0.4 MG CAPS capsule Take 1 capsule (0.4 mg total) by mouth daily. 90 capsule 1 05/18/2021    Inpatient Medications:   amLODipine  10 mg  Oral Daily   aspirin EC  81 mg Oral Daily   atorvastatin  40 mg Oral QHS   furosemide  20 mg Oral Daily   heparin  5,000 Units Subcutaneous Q8H   insulin aspart  0-15 Units Subcutaneous TID WC   insulin aspart  0-5 Units Subcutaneous QHS   tamsulosin  0.4 mg Oral Daily    Allergies:  Allergies  Allergen Reactions   Ace Inhibitors Other (See Comments)    angioedema    Social History   Socioeconomic History   Marital status: Widowed    Spouse name: Not on file   Number of children: 2   Years of education: Not on file   Highest education level: Some college, no degree  Occupational History   Occupation: retired     Comment:  used to [employer]   Tobacco Use   Smoking status: Former    Packs/day: 1.00    Years: 10.00    Pack years: 10.00    Types: Cigarettes    Start date: 03/17/1965    Quit date: 11/27/1975    Years since quitting: 45.5   Smokeless tobacco: Never   Tobacco comments:    quit 40 years  Vaping Use   Vaping Use: Never used  Substance and Sexual Activity   Alcohol use: Yes    Alcohol/week: 1.0 standard drink    Types: 1 Standard drinks or equivalent per week    Comment: socially - 1 x year   Drug use: No   Sexual activity: Yes    Partners: Female  Other Topics Concern   Not on file  Social History Narrative   Lives by himself in [place]   Two grown children, daughter in [place] and son in [place]   He goes to [place] to visit his sisters often    Social Determinants of Radio broadcast assistant Strain: Low Risk    Difficulty of Paying Living Expenses: Not hard at all  Food Insecurity: No Food Insecurity   Worried About Charity fundraiser in the Last Year: Never true   Arboriculturist in the Last Year: Never true  Transportation Needs: No Transportation Needs   Lack of Transportation (Medical): No   Lack of Transportation (Non-Medical): No  Physical Activity: Sufficiently Active   Days of Exercise per Week: 5 days   Minutes  of Exercise per Session: 30 min  Stress: No Stress Concern Present   Feeling of Stress : Not at all  Social Connections: Moderately Integrated   Frequency of Communication with Friends and Family: More than three times a week   Frequency of Social Gatherings with Friends and Family: More than three times a week   Attends Religious Services: More than 4 times per year   Active Member of [employer] or Organizations: Yes   Attends Archivist Meetings: More than 4 times per year   Marital Status: Widowed  Human resources officer Violence: Not At Risk   Fear of Current or Ex-Partner: No   Emotionally Abused: No   Physically Abused: No   Sexually Abused: No     Family History  Problem Relation Age of Onset   Heart disease Father    Hypertension Father    Diabetes Sister    Diabetes Brother    Prostate cancer Brother    Diabetes Sister    Kidney disease Neg Hx    Kidney cancer Neg Hx    Bladder Cancer Neg Hx    Colon cancer Neg Hx    Esophageal cancer Neg Hx    Inflammatory bowel disease Neg Hx    Liver disease Neg Hx    Pancreatic cancer Neg Hx    Rectal cancer Neg Hx    Stomach cancer Neg Hx      Review of Systems: All other systems reviewed and are otherwise negative except as noted above.  Physical Exam: Vitals:   05/19/21 0954 05/19/21 1107 05/19/21 2001 05/20/21 0550  BP: (!) 158/57 (!) 150/55 (!) 116/49 (!) 118/57  Pulse:  (!) 47 (!) 49 81  Resp:  17 (!) 21 18  Temp:  98.7 F (37.1 C) 98.7 F (37.1 C) 97.9 F (36.6 C)  TempSrc:  Oral Oral Oral  SpO2:  100% 97% 98%  Weight:  Height:        GEN- The patient is well appearing, alert and oriented x 3 today.   HEENT: normocephalic, atraumatic; sclera clear, conjunctiva pink; hearing intact; oropharynx clear; neck supple Lungs- Clear to ausculation bilaterally, normal work of breathing.  No wheezes, rales, rhonchi Heart- Regular rate and rhythm, no murmurs, rubs or gallops GI- soft, non-tender, non-distended,  bowel sounds present Extremities- no clubbing, cyanosis, or edema; DP/PT/radial pulses 2+ bilaterally MS- no significant deformity or atrophy Skin- warm and dry, no rash or lesion Psych- euthymic mood, full affect Neuro- strength and sensation are intact  Labs:   Lab Results  Component Value Date   WBC 5.3 05/20/2021   HGB 11.6 (L) 05/20/2021   HCT 35.5 (L) 05/20/2021   MCV 81.8 05/20/2021   PLT 124 (L) 05/20/2021    Recent Labs  Lab 05/20/21 0241  NA 136  K 4.2  CL 103  CO2 24  BUN 19  CREATININE 1.02  CALCIUM 9.3  GLUCOSE 265*      Radiology/Studies: DG Chest 2 View  Result Date: 05/18/2021 CLINICAL DATA:  Pt states he has been wearing a heart monitor since 2/28 and received a call today to come to ED so that his heart could be monitored better. Denies chest pain, sob, dizziness or any other complaints Nonsmoker, diabetic Hx of aortic stenosis, heart murmur, HTN, S/P TAVR EXAM: CHEST - 2 VIEW COMPARISON:  05/10/2021 and older studies. FINDINGS: Cardiac silhouette is normal in size. Stable prosthetic aortic valve. No mediastinal or hilar masses. No evidence of adenopathy. Eventrated anterior right hemidiaphragm, stable. Clear lungs.  No pleural effusion or pneumothorax. Skeletal structures are intact. IMPRESSION: No active cardiopulmonary disease. Electronically Signed   By: Lajean Manes M.D.   On: 05/18/2021 15:48   DG Chest 2 View  Result Date: 05/13/2021 CLINICAL DATA:  Preop, severe aortic stenosis. EXAM: CHEST - 2 VIEW COMPARISON:  Chest CTA 04/08/2021 FINDINGS: Chronic eventration of right hemidiaphragm.The cardiomediastinal contours are normal. The lungs are clear. Pulmonary vasculature is normal. No consolidation, pleural effusion, or pneumothorax. No acute osseous abnormalities are seen. IMPRESSION: No acute chest findings. Chronic eventration of right hemidiaphragm. Electronically Signed   By: Keith Rake M.D.   On: 05/13/2021 17:01   ECHOCARDIOGRAM  COMPLETE  Result Date: 05/15/2021    ECHOCARDIOGRAM REPORT   Patient Name:   Jordan [name] Date of Exam: 05/15/2021 Medical Rec #:  [id number]         Height:       69.0 in Accession #:    [id number]        Weight:       296.5 lb Date of Birth:  February 15, 1941         BSA:          2.442 m Patient Age:    53 years          BP:           139/70 mmHg Patient Gender: M                 HR:           75 bpm. Exam Location:  Inpatient Procedure: 2D Echo, Cardiac Doppler, Color Doppler and Intracardiac            Opacification Agent Indications:    Post TAVR evaluation V43.3 / Z95.2  History:        Patient has prior history of Echocardiogram examinations, most  recent 05/14/2021. Aortic Valve Disease; Risk Factors:Diabetes,                 Hypertension and Dyslipidemia. Chronic kidney disease.                 Aortic Valve: 29 mm Sapien prosthetic, stented (TAVR) valve is                 present in the aortic position. Procedure Date: 05/14/2021.  Sonographer:    Darlina Sicilian RDCS Referring Phys: [id number] [place]  1. The aortic valve has been replaced by a 29 mm Sapien prosthetic (TAVR) valve present in the aortic position. No AI/PVL. Effective orifice area, by VTI measures 4.72 cm. Aortic valve mean gradient measures 4.0 mmHg. Peak gradient 7 mm Hg. Accerlation  time 67 ms. DVI 0.77.  2. Left ventricular ejection fraction, by estimation, is 60 to 65%. The left ventricle has normal function. The left ventricle has no regional wall motion abnormalities. There is mild concentric left ventricular hypertrophy. Left ventricular diastolic parameters are consistent with Grade I diastolic dysfunction (impaired relaxation).  3. Right ventricular systolic function is normal. The right ventricular size is normal. Tricuspid regurgitation signal is inadequate for assessing PA pressure.  4. The mitral valve is grossly normal. No evidence of mitral valve regurgitation. No evidence of mitral stenosis.  Comparison(s): Stable prosthetic valve. LVEF appears robust in contrast images (not performed in priors). FINDINGS  Left Ventricle: Left ventricular ejection fraction, by estimation, is 60 to 65%. The left ventricle has normal function. The left ventricle has no regional wall motion abnormalities. Definity contrast agent was given IV to delineate the left ventricular  endocardial borders. The left ventricular internal cavity size was normal in size. There is mild concentric left ventricular hypertrophy. Left ventricular diastolic parameters are consistent with Grade I diastolic dysfunction (impaired relaxation). Right Ventricle: The right ventricular size is normal. No increase in right ventricular wall thickness. Right ventricular systolic function is normal. Tricuspid regurgitation signal is inadequate for assessing PA pressure. Left Atrium: Left atrial size was normal in size. Right Atrium: Right atrial size was not well visualized. Pericardium: There is no evidence of pericardial effusion. Presence of epicardial fat layer. Mitral Valve: The mitral valve is grossly normal. No evidence of mitral valve regurgitation. No evidence of mitral valve stenosis. Tricuspid Valve: The tricuspid valve is grossly normal. Tricuspid valve regurgitation is not demonstrated. No evidence of tricuspid stenosis. Aortic Valve: The aortic valve has been repaired/replaced. Aortic valve regurgitation is not visualized. Aortic valve mean gradient measures 4.0 mmHg. Aortic valve peak gradient measures 6.2 mmHg. Aortic valve area, by VTI measures 4.72 cm. There is a 29 mm Sapien prosthetic, stented (TAVR) valve present in the aortic position. Procedure Date: 05/14/2021. Pulmonic Valve: The pulmonic valve was not well visualized. Pulmonic valve regurgitation is not visualized. No evidence of pulmonic stenosis. Aorta: The aortic root is normal in size and structure and the ascending aorta was not well visualized. IAS/Shunts: The atrial  septum is grossly normal.  LEFT VENTRICLE PLAX 2D LVIDd:         5.30 cm      Diastology LVIDs:         3.60 cm      LV e' medial:    5.03 cm/s LV PW:         1.20 cm      LV E/e' medial:  12.5 LV IVS:        1.30 cm  LV e' lateral:   6.32 cm/s LVOT diam:     2.80 cm      LV E/e' lateral: 9.9 LV SV:         114 LV SV Index:   47 LVOT Area:     6.16 cm  LV Volumes (MOD) LV vol d, MOD A4C: 189.0 ml LV vol s, MOD A4C: 74.5 ml LV SV MOD A4C:     189.0 ml RIGHT VENTRICLE TAPSE (M-mode): 2.3 cm LEFT ATRIUM           Index LA diam:      4.40 cm 1.80 cm/m LA Vol (A4C): 54.4 ml 22.27 ml/m  AORTIC VALVE AV Area (Vmax):    5.00 cm AV Area (Vmean):   4.86 cm AV Area (VTI):     4.72 cm AV Vmax:           124.50 cm/s AV Vmean:          91.550 cm/s AV VTI:            0.242 m AV Peak Grad:      6.2 mmHg AV Mean Grad:      4.0 mmHg LVOT Vmax:         101.00 cm/s LVOT Vmean:        72.200 cm/s LVOT VTI:          0.185 m LVOT/AV VTI ratio: 0.77 MITRAL VALVE MV Area (PHT): 2.90 cm    SHUNTS MV Decel Time: 262 msec    Systemic VTI:  0.18 m MV E velocity: 62.80 cm/s  Systemic Diam: 2.80 cm MV A velocity: 61.20 cm/s MV E/A ratio:  1.03 Rudean Haskell MD Electronically signed by Rudean Haskell MD Signature Date/Time: 05/15/2021/5:17:44 PM    Final    ECHOCARDIOGRAM LIMITED  Result Date: 05/14/2021    ECHOCARDIOGRAM LIMITED REPORT   Patient Name:   Jordan [name] Date of Exam: 05/14/2021 Medical Rec #:  [id number]         Height:       69.0 in Accession #:    [id number]        Weight:       290.0 lb Date of Birth:  1940/12/13         BSA:          2.419 m Patient Age:    89 years          BP:           161/81 mmHg Patient Gender: M                 HR:           77 bpm. Exam Location:  Inpatient Procedure: Limited Echo, Cardiac Doppler and Color Doppler Indications:     I35.0 Nonrheumatic aortic (valve) stenosis  History:         Patient has prior history of Echocardiogram examinations, most                  recent  03/15/2021. Aortic Valve Disease; Risk Factors:Diabetes                  and Hypertension. Severe aortic stenosis.                  Aortic Valve: 29 mm Edwards Sapien prosthetic, stented (TAVR)                  valve is present in the aortic position. Procedure  Date:                  05/14/2021.  Sonographer:     Roseanna Rainbow RDCS Referring Phys:  [id number] Woodfin Ganja THOMPSON Diagnosing Phys: Rudean Haskell MD  Sonographer Comments: Technically difficult study due to poor echo windows. Image acquisition challenging due to patient body habitus. TAVR procedure. IMPRESSIONS  1. Interventional echo for TAVR placement.  2. Prior to procedure, calcified aortic valve with AVA 1.02 cm2 by continuity equation, with mean gradient 26 mmHg, peak gradient 37 mm Hg, trivial AI, and DVI 0.19 consistent with moderate to severea aortic stenosis.  3. There is a 29 mm Edwards Sapien prosthetic (TAVR) valve present in the aortic position. There is no paravalvular leak. Effective orifice area 4.22 cm2. mean gradient 2 mm Hg, peak graidnet 4 mm Hg, DVI 0.79.  4. Post protamine, patient was hypotensive. There was no new pericardial effusion, change in LVEF, or change in prosthetic parameters. Hypotension resolved.  5. Left ventricular ejection fraction, by estimation, is 50 to 55%. The left ventricle has low normal function. The left ventricle has no regional wall motion abnormalities. There is mild concentric left ventricular hypertrophy.  6. Right ventricular systolic function is normal. The right ventricular size is normal.  7. The mitral valve is grossly normal. No evidence of mitral valve regurgitation.  8. Aortic dilatation noted. There is mild dilatation of the ascending aorta, measuring 41 mm. Comparison(s): Successful TAVR implantation. FINDINGS  Left Ventricle: Left ventricular ejection fraction, by estimation, is 50 to 55%. The left ventricle has low normal function. The left ventricle has no regional wall motion abnormalities.  There is mild concentric left ventricular hypertrophy. Right Ventricle: The right ventricular size is normal. Right ventricular systolic function is normal. Pericardium: Trivial pericardial effusion is present. Presence of epicardial fat layer. Mitral Valve: The mitral valve is grossly normal. Tricuspid Valve: Tricuspid valve regurgitation is mild. Aortic Valve: Aortic valve mean gradient measures 14.0 mmHg. Aortic valve peak gradient measures 16.4 mmHg. Aortic valve area, by VTI measures 1.79 cm. There is a 29 mm Edwards Sapien prosthetic, stented (TAVR) valve present in the aortic position. Procedure Date: 05/14/2021. Aorta: Aortic dilatation noted. There is mild dilatation of the ascending aorta, measuring 41 mm. LEFT VENTRICLE PLAX 2D LVIDd:         3.90 cm LVIDs:         2.90 cm LV PW:         1.30 cm LV IVS:        1.10 cm LVOT diam:     2.65 cm LV SV:         90 LV SV Index:   37 LVOT Area:     5.52 cm  LV Volumes (MOD) LV vol d, MOD A2C: 80.0 ml LV vol d, MOD A4C: 122.0 ml LV vol s, MOD A2C: 36.9 ml LV vol s, MOD A4C: 63.1 ml LV SV MOD A2C:     43.1 ml LV SV MOD A4C:     122.0 ml LV SV MOD BP:      52.9 ml AORTIC VALVE AV Area (Vmax):    2.10 cm AV Area (Vmean):   1.84 cm AV Area (VTI):     1.79 cm AV Vmax:           202.35 cm/s AV Vmean:          156.450 cm/s AV VTI:            0.501 m  AV Peak Grad:      16.4 mmHg AV Mean Grad:      14.0 mmHg LVOT Vmax:         76.95 cm/s LVOT Vmean:        52.200 cm/s LVOT VTI:          0.162 m LVOT/AV VTI ratio: 0.32  AORTA Ao Asc diam: 3.95 cm  SHUNTS Systemic VTI:  0.16 m Systemic Diam: 2.65 cm Rudean Haskell MD Electronically signed by Rudean Haskell MD Signature Date/Time: 05/14/2021/5:15:10 PM    Final    Structural Heart Procedure  Result Date: 05/14/2021 See surgical note for result.   EKG:on arrival appears to show at least 2:1 AV block if not intermittently complete (personally reviewed)  TELEMETRY: Intermittent second degree AV block with  brief periods of sinus. ? Mobitz I at times as well with grouped beating. (personally reviewed)  Zio strips Reviewed which show CHB during waking hours.   Assessment/Plan: 1.  Advanced AV block With 2:1 AV block and intermittent CHB noted on Zio S/p TAVR 05/14/2021, recovery seems unlikely.  Explained risks, benefits, and alternatives to PPM implantation, including but not limited to bleeding, infection, pneumothorax, pericardial effusion, lead dislodgement, heart attack, stroke, or death.  Pt verbalized understanding and agrees to proceed if indicated.   2. Severe AS s/p TAVR 10/25/5724 Uncomplicated post op course  3. CAD Denies s/s ischemia  4. HTN Stable on current regimen   5. R renal mass Seen on CT in Jan Has been referred to urology as outpatient.   Tentatively planned for PPM this afternoon.   For questions or updates, please contact [facility] Please consult [email or web address] for contact info under Cardiology/STEMI.  Jacalyn Lefevre, PA-C  05/20/2021 7:08 AM

## 2021-05-20 NOTE — Progress Notes (Incomplete)
?[facility]   ?Jordan [name] ?                                    ?Cardiology Office Note:   ? ?Date:  05/20/2021  ? ?ID:  PEPPER Jordan [name], DOB [birth year], MRN [id number] ? ?PCP:  Steele Sizer, MD  ?[facility] Cardiologist:  Ida Rogue, MD  ?[facility] Electrophysiologist:  None  ? ?Referring MD: Steele Sizer, MD  ? ?No chief complaint on file. ?*** ? ?History of Present Illness:   ? ?Jordan [name] is a 81 y.o. male with a hx of *** ? ?Past Medical History:  ?Diagnosis Date  ? Aortic stenosis   ? Chronic kidney disease, stage II (mild)   ? Decreased libido   ? Diabetes mellitus without complication ([place])   ? Glaucoma   ? Gout 2009  ? Heart murmur   ? Hernia 2423,5361  ? Hyperlipidemia   ? Hypertension   ? Inguinal hernia without mention of obstruction or gangrene, unilateral or unspecified, (not specified as recurrent)   ? Lumbago   ? Obesity   ? 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 s/p TAVR with a 29 mm Edwards S3UR via the TF approach by Dr. Burt Knack and Dr. Cyndia Bent  ? Unspecified constipation   ? ? ?Past Surgical History:  ?Procedure Laterality Date  ? cataract surgery    ? COLONOSCOPY  03/17/2010  ? COLONOSCOPY N/A 04/22/2019  ? Procedure: COLONOSCOPY;  Surgeon: Rush Landmark Telford Nab., MD;  Location: [facility];  Service: Gastroenterology;  Laterality: N/A;  ? EYE SURGERY  03/17/2009  ? cataract  ? HERNIA REPAIR    ? INTRAOPERATIVE TRANSTHORACIC ECHOCARDIOGRAM N/A 05/14/2021  ? Procedure: INTRAOPERATIVE TRANSTHORACIC ECHOCARDIOGRAM;  Surgeon: Sherren Mocha, MD;  Location: [facility];  Service: Open Heart Surgery;  Laterality: N/A;  ? RIGHT HEART CATH AND CORONARY ANGIOGRAPHY Bilateral 02/15/2021  ? Procedure: RIGHT HEART CATH AND CORONARY ANGIOGRAPHY;  Surgeon: Nelva Bush, MD;  Location: [facility];  Service: Cardiovascular;  Laterality: Bilateral;  ? TRANSCATHETER AORTIC VALVE REPLACEMENT, TRANSFEMORAL  N/A 05/14/2021  ? Procedure: Transcatheter Aortic Valve Replacement, Transfemoral;  Surgeon: Sherren Mocha, MD;  Location: [place] [facility];  Service: Open Heart Surgery;  Laterality: N/A;  ? UMBILICAL HERNIA REPAIR  03/17/2009  ? ? ?Current Medications: ?No outpatient medications have been marked as taking for the 05/20/21 encounter (Appointment) with [facility].  ?  ? ?Allergies:   Ace inhibitors  ? ?Social History  ? ?Socioeconomic History  ? Marital status: Widowed  ?  Spouse name: Not on file  ? Number of children: 2  ? Years of education: Not on file  ? Highest education level: Some college, no degree  ?Occupational History  ? Occupation: retired   ?  Comment: used to [employer]   ?Tobacco Use  ? Smoking status: Former  ?  Packs/day: 1.00  ?  Years: 10.00  ?  Pack years: 10.00  ?  Types: Cigarettes  ?  Start date: 03/17/1965  ?  Quit date: 11/27/1975  ?  Years since quitting: 45.5  ? Smokeless tobacco: Never  ? Tobacco comments:  ?  quit 40 years  ?Vaping Use  ? Vaping Use: Never used  ?Substance and Sexual Activity  ? Alcohol use: Yes  ?  Alcohol/week: 1.0 standard drink  ?  Types:  1 Standard drinks or equivalent per week  ?  Comment: socially - 1 x year  ? Drug use: No  ? Sexual activity: Yes  ?  Partners: Female  ?Other Topics Concern  ? Not on file  ?Social History Narrative  ? Lives by himself in [place]  ? Two grown children, daughter in [place] and son in [place]  ? He goes to [place] to visit his sisters often   ? ?Social Determinants of Health  ? ?Financial Resource Strain: Low Risk   ? Difficulty of Paying Living Expenses: Not hard at all  ?Food Insecurity: No Food Insecurity  ? Worried About Charity fundraiser in the Last Year: Never true  ? Ran Out of Food in the Last Year: Never true  ?Transportation Needs: No Transportation Needs  ? Lack of Transportation (Medical): No  ? Lack of Transportation (Non-Medical): No  ?Physical Activity: Sufficiently Active  ? Days  of Exercise per Week: 5 days  ? Minutes of Exercise per Session: 30 min  ?Stress: No Stress Concern Present  ? Feeling of Stress : Not at all  ?Social Connections: Moderately Integrated  ? Frequency of Communication with Friends and Family: More than three times a week  ? Frequency of Social Gatherings with Friends and Family: More than three times a week  ? Attends Religious Services: More than 4 times per year  ? Active Member of Clubs or Organizations: Yes  ? Attends Archivist Meetings: More than 4 times per year  ? Marital Status: Widowed  ?  ? ?Family History: ?The patient's ***family history includes Diabetes in his brother, sister, and sister; Heart disease in his father; Hypertension in his father; Prostate cancer in his brother. There is no history of Kidney disease, Kidney cancer, Bladder Cancer, Colon cancer, Esophageal cancer, Inflammatory bowel disease, Liver disease, Pancreatic cancer, Rectal cancer, or Stomach cancer. ? ?ROS:   ?Please see the history of present illness.    ?All other systems reviewed and are negative. ? ?EKGs/Labs/Other Studies Reviewed:   ? ?The following studies were reviewed today: ?*** ? ?EKG:  EKG is *** ordered today.  The ekg ordered today demonstrates *** ? ?Recent Labs: ?05/10/2021: ALT 16; B Natriuretic Peptide 47.1 ?05/18/2021: Magnesium 1.7 ?05/20/2021: BUN 19; Creatinine, Ser 1.02; Hemoglobin 11.6; Platelets 124; Potassium 4.2; Sodium 136  ?Recent Lipid Panel ?   ?Component Value Date/Time  ? CHOL 122 01/16/2021 1148  ? CHOL 131 08/27/2015 1158  ? TRIG 73 01/16/2021 1148  ? HDL 49 01/16/2021 1148  ? HDL 40 08/27/2015 1158  ? CHOLHDL 2.5 01/16/2021 1148  ? VLDL 20 07/28/2016 1248  ? [place] 58 01/16/2021 1148  ? ? ? ?Risk Assessment/Calculations:   ?{Does this patient have ATRIAL FIBRILLATION?:[phone]} ? ? ?Physical Exam:   ? ?VS:  There were no vitals taken for this visit.   ? ?Wt Readings from Last 3 Encounters:  ?05/18/21 293 lb 12.8 oz (133.3 kg)  ?05/15/21  296 lb 8.3 oz (134.5 kg)  ?05/10/21 291 lb (132 kg)  ?  ? ?GEN: *** Well nourished, well developed in no acute distress ?HEENT: Normal ?NECK: No JVD; No carotid bruits ?LYMPHATICS: No lymphadenopathy ?CARDIAC: ***RRR, no murmurs, rubs, gallops ?RESPIRATORY:  Clear to auscultation without rales, wheezing or rhonchi  ?ABDOMEN: Soft, non-tender, non-distended ?MUSCULOSKELETAL:  No edema; No deformity  ?SKIN: Warm and dry ?NEUROLOGIC:  Alert and oriented x 3 ?PSYCHIATRIC:  Normal affect  ? ?ASSESSMENT:   ? ?No diagnosis found. ?PLAN:   ? ?  In order of problems listed above: ? ? ? ?  ? ?{Are you ordering a CV Procedure (e.g. stress test, cath, DCCV, TEE, etc)?   Press F2        :[id number]}  ? ? ?Medication Adjustments/Labs and Tests Ordered: ?Current medicines are reviewed at length with the patient today.  Concerns regarding medicines are outlined above.  ?No orders of the defined types were placed in this encounter. ? ?No orders of the defined types were placed in this encounter. ? ? ?There are no Patient Instructions on file for this visit.  ? ?Signed, ?Kathyrn Drown, NP  ?05/20/2021 8:06 AM    ?[place] ?

## 2021-05-21 ENCOUNTER — Encounter (HOSPITAL_COMMUNITY)
Admission: EM | Disposition: A | Discharge status: Home / Self Care | Payer: Self-pay | Source: Home / Self Care | Attending: Cardiology

## 2021-05-21 DIAGNOSIS — R001 Bradycardia, unspecified: Secondary | ICD-10-CM

## 2021-05-21 HISTORY — PX: PACEMAKER IMPLANT: EP1218

## 2021-05-21 LAB — GLUCOSE, CAPILLARY
Glucose-Capillary: 182 mg/dL — ABNORMAL HIGH (ref 70–99)
Glucose-Capillary: 133 mg/dL — ABNORMAL HIGH (ref 70–99)
Glucose-Capillary: 237 mg/dL — ABNORMAL HIGH (ref 70–99)

## 2021-05-21 SURGERY — PACEMAKER IMPLANT

## 2021-05-21 MED ORDER — HEPARIN (PORCINE) IN NACL 1000-0.9 UT/500ML-% IV SOLN
INTRAVENOUS | Status: DC | PRN
  Administered 2021-05-21: 500 mL

## 2021-05-21 MED ORDER — SODIUM CHLORIDE 0.9 % IV SOLN: INTRAVENOUS | Status: DC

## 2021-05-21 MED ORDER — SODIUM CHLORIDE 0.9 % IV SOLN
80.0000 mg | INTRAVENOUS | Status: AC
  Administered 2021-05-21: 80 mg
  Filled 2021-05-21: qty 2

## 2021-05-21 MED ORDER — IOHEXOL 350 MG/ML SOLN
INTRAVENOUS | Status: DC | PRN
  Administered 2021-05-21: 10 mL

## 2021-05-21 MED ORDER — LIDOCAINE HCL (PF) 1 % IJ SOLN
INTRAMUSCULAR | Status: DC | PRN
  Administered 2021-05-21: 50 mL

## 2021-05-21 MED ORDER — FENTANYL CITRATE (PF) 100 MCG/2ML IJ SOLN
INTRAMUSCULAR | Status: AC
  Filled 2021-05-21: qty 2

## 2021-05-21 MED ORDER — ONDANSETRON HCL 4 MG/2ML IJ SOLN: 4.0000 mg | Freq: Four times a day (QID) | INTRAMUSCULAR | Status: DC | PRN

## 2021-05-21 MED ORDER — CHLORHEXIDINE GLUCONATE 4 % EX LIQD
60.0000 mL | Freq: Once | CUTANEOUS | Status: AC
  Administered 2021-05-21: 4 via TOPICAL
  Filled 2021-05-21: qty 60

## 2021-05-21 MED ORDER — LIDOCAINE HCL (PF) 1 % IJ SOLN
INTRAMUSCULAR | Status: AC
  Filled 2021-05-21: qty 30

## 2021-05-21 MED ORDER — MIDAZOLAM HCL 5 MG/5ML IJ SOLN
INTRAMUSCULAR | Status: AC
  Filled 2021-05-21: qty 5

## 2021-05-21 MED ORDER — CEFAZOLIN SODIUM-DEXTROSE 1-4 GM/50ML-% IV SOLN
1.0000 g | Freq: Four times a day (QID) | INTRAVENOUS | Status: AC
  Administered 2021-05-21 – 2021-05-22 (×3): 1 g via INTRAVENOUS
  Filled 2021-05-21 (×3): qty 50

## 2021-05-21 MED ORDER — CEFAZOLIN IN SODIUM CHLORIDE 3-0.9 GM/100ML-% IV SOLN
3.0000 g | INTRAVENOUS | Status: AC
  Administered 2021-05-21: 3 g via INTRAVENOUS
  Filled 2021-05-21 (×3): qty 100

## 2021-05-21 MED ORDER — HEPARIN (PORCINE) IN NACL 1000-0.9 UT/500ML-% IV SOLN
INTRAVENOUS | Status: AC
  Filled 2021-05-21: qty 500

## 2021-05-21 MED ORDER — MIDAZOLAM HCL 5 MG/5ML IJ SOLN
INTRAMUSCULAR | Status: DC | PRN
  Administered 2021-05-21: 2 mg via INTRAVENOUS

## 2021-05-21 MED ORDER — FENTANYL CITRATE (PF) 100 MCG/2ML IJ SOLN
INTRAMUSCULAR | Status: DC | PRN
  Administered 2021-05-21: 25 ug via INTRAVENOUS

## 2021-05-21 MED ORDER — SODIUM CHLORIDE 0.9 % IV SOLN
INTRAVENOUS | Status: AC
  Filled 2021-05-21: qty 2

## 2021-05-21 MED ORDER — ACETAMINOPHEN 325 MG PO TABS: 325.0000 mg | ORAL_TABLET | ORAL | Status: DC | PRN

## 2021-05-21 SURGICAL SUPPLY — 12 items
CABLE SURGICAL S-101-97-12 (CABLE) ×3 IMPLANT
CATH RIGHTSITE C315HIS02 (CATHETERS) ×2 IMPLANT
IPG PACE AZUR XT DR MRI W1DR01 (Pacemaker) IMPLANT
LEAD CAPSURE NOVUS 5076-52CM (Lead) ×2 IMPLANT
LEAD SELECT SECURE 3830 383069 (Lead) IMPLANT
PACE AZURE XT DR MRI W1DR01 (Pacemaker) ×3 IMPLANT
PAD DEFIB RADIO PHYSIO CONN (PAD) ×3 IMPLANT
SELECT SECURE 3830 383069 (Lead) ×3 IMPLANT
SHEATH 7FR PRELUDE SNAP 13 (SHEATH) ×4 IMPLANT
TRAY PACEMAKER INSERTION (PACKS) ×3 IMPLANT
WIRE HI TORQ VERSACORE-J 145CM (WIRE) ×2 IMPLANT
WIRE MICRO SET SILHO 5FR 7 (SHEATH) ×2 IMPLANT

## 2021-05-21 NOTE — Plan of Care (Signed)
?  Problem: Education: ?Goal: Knowledge of General Education information will improve ?Description: Including pain rating scale, medication(s)/side effects and non-pharmacologic comfort measures ?Outcome: Progressing ?  ?Problem: Health Behavior/Discharge Planning: ?Goal: Ability to manage health-related needs will improve ?Outcome: Progressing ?  ?Problem: Clinical Measurements: ?Goal: Ability to maintain clinical measurements within normal limits will improve ?Outcome: Progressing ?  ?Problem: Clinical Measurements: ?Goal: Cardiovascular complication will be avoided ?Outcome: Progressing ?  ?Problem: Pain Managment: ?Goal: General experience of comfort will improve ?Outcome: Progressing ?  ?

## 2021-05-21 NOTE — H&P (View-Only) (Signed)
? ?Electrophysiology Rounding Note ? ?Patient Name: Jordan [name] ?Date of Encounter: 05/21/2021 ? ?Primary Cardiologist: Ida Rogue, MD ?Electrophysiologist: New ? ? ?Subjective  ? ?The patient is doing well today.  At this time, the patient denies chest pain, shortness of breath, or any new concerns. ? ?Inpatient Medications  ?  ?Scheduled Meds: ? amLODipine  10 mg Oral Daily  ? aspirin EC  81 mg Oral Daily  ? atorvastatin  40 mg Oral QHS  ? chlorhexidine  60 mL Topical Once  ? chlorhexidine  60 mL Topical Once  ? furosemide  20 mg Oral Daily  ? gentamicin irrigation  80 mg Irrigation On Call  ? insulin aspart  0-15 Units Subcutaneous TID WC  ? insulin aspart  0-5 Units Subcutaneous QHS  ? tamsulosin  0.4 mg Oral Daily  ? ?Continuous Infusions: ? sodium chloride 50 mL/hr at 05/21/21 0616  ? sodium chloride 50 mL/hr at 05/21/21 0615  ?  ceFAZolin (ANCEF) IV    ? ?PRN Meds: ?acetaminophen, nitroGLYCERIN, ondansetron (ZOFRAN) IV  ? ?Vital Signs  ?  ?Vitals:  ? 05/20/21 2103 05/20/21 2359 05/21/21 0000 05/21/21 0449  ?BP:  130/77 130/77 126/75  ?Pulse: 81 83 88 80  ?Resp: '12 19 17 19  '$ ?Temp: 97.9 ?F (36.6 ?C) 98.3 ?F (36.8 ?C)  97.9 ?F (36.6 ?C)  ?TempSrc: Oral Oral  Oral  ?SpO2: 99% 94% 97% 98%  ?Weight:      ?Height:      ? ? ?Intake/Output Summary (Last 24 hours) at 05/21/2021 0839 ?Last data filed at 05/21/2021 [phone] ?Gross per 24 hour  ?Intake 402.08 ml  ?Output 2400 ml  ?Net -1997.92 ml  ? ?Filed Weights  ? 05/18/21 1516 05/18/21 2034  ?Weight: 127 kg 133.3 kg  ? ? ?Physical Exam  ?  ?GEN- The patient is well appearing, alert and oriented x 3 today.   ?Head- normocephalic, atraumatic ?Eyes-  Sclera clear, conjunctiva pink ?Ears- hearing intact ?Oropharynx- clear ?Neck- supple ?Lungs- Clear to ausculation bilaterally, normal work of breathing ?Heart- Regular rate and rhythm, no murmurs, rubs or gallops ?GI- soft, NT, ND, + BS ?Extremities- no clubbing or cyanosis. No edema ?Skin- no rash or lesion ?Psych-  euthymic mood, full affect ?Neuro- strength and sensation are intact ? ?Labs  ?  ?CBC ?Recent Labs  ?  05/18/21 ?1524 05/20/21 ?0241  ?WBC 6.8 5.3  ?HGB 12.4* 11.6*  ?HCT 39.3 35.5*  ?MCV 82.9 81.8  ?PLT 132* 124*  ? ?Basic Metabolic Panel ?Recent Labs  ?  05/18/21 ?1524 05/19/21 ?0119 05/20/21 ?0241  ?NA 137 136 136  ?K 4.1 3.6 4.2  ?CL 102 102 103  ?CO2 21* 25 24  ?GLUCOSE 206* 246* 265*  ?BUN '17 17 19  '$ ?CREATININE 1.03 1.05 1.02  ?CALCIUM 9.6 9.6 9.3  ?MG 1.7  --   --   ? ?Liver Function Tests ?No results for input(s): AST, ALT, ALKPHOS, BILITOT, PROT, ALBUMIN in the last 72 hours. ?No results for input(s): LIPASE, AMYLASE in the last 72 hours. ?Cardiac Enzymes ?No results for input(s): CKTOTAL, CKMB, CKMBINDEX, TROPONINI in the last 72 hours. ? ? ?Telemetry  ?  ?NSR 70-80s currently, had several dropped beats but otherwise more stable (personally reviewed) ? ?Radiology  ?  ?No results found. ? ?Patient Profile  ?   ?Jordan [name] is a 81 y.o. male with a history of hypertension, hyperlipidemia, morbid obesity, CAD, R renal mass, and history of severe aortic stenosis s/p recent TAVR  who awas admitted for CHB and who is being seen today for the evaluation of advanced AV block at the request of Dr. Gardiner Rhyme. ? ?Assessment & Plan  ?  ?Intermittent advanced AV block s/p TAVR ?Conduction better today, but highly likely that it will worsen over time/go in and out ?He is indicated for pacing ?Explained risks, benefits, and alternatives to PPM implantation, including but not limited to bleeding, infection, pneumothorax, pericardial effusion, lead dislodgement, heart attack, stroke, or death.  Pt verbalized understanding and agrees to proceed. ?Plan for PPM this afternoon pending lab availability.  ?Post TAVR echo 05/15/2021 EF 60-65% ? ? ?For questions or updates, please contact [place] ?Please consult [email or web address] for contact info under Cardiology/STEMI. ? ?Signed, ?Shirley Friar, PA-C  ?05/21/2021,  8:39 AM  ? ?EP Attending ? ?Patient seen and examined. Agree with above. 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 I have discussed the indications/risks/benefits/goals/expectations of PPM insertion. We will perform later today or tomorrow as schedule allows.  ? ?Carleene Overlie Marveline Profeta,MD ?

## 2021-05-21 NOTE — Interval H&P Note (Signed)
History and Physical Interval Note: ? ?05/21/2021 ?5:26 PM ? ?[name]  has presented today for surgery, with the diagnosis of heart block.  The various methods of treatment have been discussed with the patient and family. After consideration of risks, benefits and other options for treatment, the patient has consented to  Procedure(s): ?PACEMAKER IMPLANT (N/A) as a surgical intervention.  The patient's history has been reviewed, patient examined, no change in status, stable for surgery.  I have reviewed the patient's chart and labs.  Questions were answered to the patient's satisfaction.   ? ? ?Jordan [name] ? ? ?

## 2021-05-21 NOTE — Progress Notes (Addendum)
? ?Electrophysiology Rounding Note ? ?Patient Name: Jordan [name] ?Date of Encounter: 05/21/2021 ? ?Primary Cardiologist: Ida Rogue, MD ?Electrophysiologist: New ? ? ?Subjective  ? ?The patient is doing well today.  At this time, the patient denies chest pain, shortness of breath, or any new concerns. ? ?Inpatient Medications  ?  ?Scheduled Meds: ? amLODipine  10 mg Oral Daily  ? aspirin EC  81 mg Oral Daily  ? atorvastatin  40 mg Oral QHS  ? chlorhexidine  60 mL Topical Once  ? chlorhexidine  60 mL Topical Once  ? furosemide  20 mg Oral Daily  ? gentamicin irrigation  80 mg Irrigation On Call  ? insulin aspart  0-15 Units Subcutaneous TID WC  ? insulin aspart  0-5 Units Subcutaneous QHS  ? tamsulosin  0.4 mg Oral Daily  ? ?Continuous Infusions: ? sodium chloride 50 mL/hr at 05/21/21 0616  ? sodium chloride 50 mL/hr at 05/21/21 0615  ?  ceFAZolin (ANCEF) IV    ? ?PRN Meds: ?acetaminophen, nitroGLYCERIN, ondansetron (ZOFRAN) IV  ? ?Vital Signs  ?  ?Vitals:  ? 05/20/21 2103 05/20/21 2359 05/21/21 0000 05/21/21 0449  ?BP:  130/77 130/77 126/75  ?Pulse: 81 83 88 80  ?Resp: '12 19 17 19  '$ ?Temp: 97.9 ?F (36.6 ?C) 98.3 ?F (36.8 ?C)  97.9 ?F (36.6 ?C)  ?TempSrc: Oral Oral  Oral  ?SpO2: 99% 94% 97% 98%  ?Weight:      ?Height:      ? ? ?Intake/Output Summary (Last 24 hours) at 05/21/2021 0839 ?Last data filed at 05/21/2021 [phone] ?Gross per 24 hour  ?Intake 402.08 ml  ?Output 2400 ml  ?Net -1997.92 ml  ? ?Filed Weights  ? 05/18/21 1516 05/18/21 2034  ?Weight: 127 kg 133.3 kg  ? ? ?Physical Exam  ?  ?GEN- The patient is well appearing, alert and oriented x 3 today.   ?Head- normocephalic, atraumatic ?Eyes-  Sclera clear, conjunctiva pink ?Ears- hearing intact ?Oropharynx- clear ?Neck- supple ?Lungs- Clear to ausculation bilaterally, normal work of breathing ?Heart- Regular rate and rhythm, no murmurs, rubs or gallops ?GI- soft, NT, ND, + BS ?Extremities- no clubbing or cyanosis. No edema ?Skin- no rash or lesion ?Psych-  euthymic mood, full affect ?Neuro- strength and sensation are intact ? ?Labs  ?  ?CBC ?Recent Labs  ?  05/18/21 ?1524 05/20/21 ?0241  ?WBC 6.8 5.3  ?HGB 12.4* 11.6*  ?HCT 39.3 35.5*  ?MCV 82.9 81.8  ?PLT 132* 124*  ? ?Basic Metabolic Panel ?Recent Labs  ?  05/18/21 ?1524 05/19/21 ?0119 05/20/21 ?0241  ?NA 137 136 136  ?K 4.1 3.6 4.2  ?CL 102 102 103  ?CO2 21* 25 24  ?GLUCOSE 206* 246* 265*  ?BUN '17 17 19  '$ ?CREATININE 1.03 1.05 1.02  ?CALCIUM 9.6 9.6 9.3  ?MG 1.7  --   --   ? ?Liver Function Tests ?No results for input(s): AST, ALT, ALKPHOS, BILITOT, PROT, ALBUMIN in the last 72 hours. ?No results for input(s): LIPASE, AMYLASE in the last 72 hours. ?Cardiac Enzymes ?No results for input(s): CKTOTAL, CKMB, CKMBINDEX, TROPONINI in the last 72 hours. ? ? ?Telemetry  ?  ?NSR 70-80s currently, had several dropped beats but otherwise more stable (personally reviewed) ? ?Radiology  ?  ?No results found. ? ?Patient Profile  ?   ?NEYMAR DOWE is a 81 y.o. male with a history of hypertension, hyperlipidemia, morbid obesity, CAD, R renal mass, and history of severe aortic stenosis s/p recent TAVR  who awas admitted for CHB and who is being seen today for the evaluation of advanced AV block at the request of Dr. Gardiner Rhyme. ? ?Assessment & Plan  ?  ?Intermittent advanced AV block s/p TAVR ?Conduction better today, but highly likely that it will worsen over time/go in and out ?He is indicated for pacing ?Explained risks, benefits, and alternatives to PPM implantation, including but not limited to bleeding, infection, pneumothorax, pericardial effusion, lead dislodgement, heart attack, stroke, or death.  Pt verbalized understanding and agrees to proceed. ?Plan for PPM this afternoon pending lab availability.  ?Post TAVR echo 05/15/2021 EF 60-65% ? ? ?For questions or updates, please contact [place] ?Please consult [email or web address] for contact info under Cardiology/STEMI. ? ?Signed, ?Shirley Friar, PA-C  ?05/21/2021,  8:39 AM  ? ?EP Attending ? ?Patient seen and examined. Agree with above. 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 I have discussed the indications/risks/benefits/goals/expectations of PPM insertion. We will perform later today or tomorrow as schedule allows.  ? ?Carleene Overlie Shianna Bally,MD ?

## 2021-05-22 ENCOUNTER — Encounter (HOSPITAL_COMMUNITY): Payer: Self-pay | Admitting: Internal Medicine

## 2021-05-22 ENCOUNTER — Inpatient Hospital Stay (HOSPITAL_COMMUNITY): Payer: Medicare HMO

## 2021-05-22 LAB — GLUCOSE, CAPILLARY
Glucose-Capillary: 179 mg/dL — ABNORMAL HIGH (ref 70–99)
Glucose-Capillary: 222 mg/dL — ABNORMAL HIGH (ref 70–99)

## 2021-05-22 MED ORDER — ACETAMINOPHEN 325 MG PO TABS: 650.0000 mg | ORAL_TABLET | ORAL | Status: AC | PRN

## 2021-05-22 NOTE — Discharge Summary (Signed)
? ? ? ?ELECTROPHYSIOLOGY PROCEDURE DISCHARGE SUMMARY  ? ? ?Patient ID: Jordan [name],  ?MRN: [id number], DOB/AGE: 07/16/1940 81 y.o. ? ?Admit date: 05/18/2021 ?Discharge date: 05/22/2021 ? ?Primary Care Physician: Steele Sizer, MD  ?Primary Cardiologist: Ida Rogue, MD  ?Electrophysiologist: Dr. Lovena Le ? ?Primary Discharge Diagnosis:  ?Symptomatic bradycardia status post pacemaker implantation this admission ? ?Secondary Discharge Diagnosis:  ?AS s/p TAVR ?Intermittent CHB ? ?Allergies  ?Allergen Reactions  ? Ace Inhibitors Other (See Comments)  ?  angioedema  ? ? ? ?Procedures This Admission:  ?1.  Implantation of a Medtronic dual chamber PPM on 05/21/2021 by Dr. Lovena Le. The patient received a Medtronic model number [id number] PPM with model number 5076 right atrial lead and 3830 right ventricular lead. There were no immediate post procedure complications. ?2.  CXR on 05/22/21 demonstrated no pneumothorax status post device implantation.  ? ?Brief HPI: ?Jordan [name] is a 81 y.o. male was admitted for CHB on monitor post TAVR and electrophysiology team asked to see for ?consideration of PPM implantation.  Past medical history includes above.  The patient has had symptomatic bradycardia without reversible causes identified.  Risks, benefits, and alternatives to PPM implantation were reviewed with the patient who wished to proceed.  ? ?Hospital Course:  ?The patient was admitted and underwent implantation of a Medtronic dual chamber PPM with details as outlined above.  He was monitored on telemetry overnight which demonstrated NSR.  Left chest was without hematoma or ecchymosis.  The device was interrogated and found to be functioning normally.  CXR was obtained and demonstrated no pneumothorax status post device implantation.  Wound care, arm mobility, and restrictions were reviewed with the patient.  The patient was examined and considered stable for discharge to home.   ? ?Pt not on blood thinner.  ? ?Physical  Exam: ?Vitals:  ? 05/21/21 2326 05/21/21 2348 05/22/21 0000 05/22/21 0326  ?BP: (!) 150/67 (!) 142/70  (!) 146/71  ?Pulse: 90 86 70 83  ?Resp: 20 (!) 21  (!) 22  ?Temp:  98.1 ?F (36.7 ?C)  98.9 ?F (37.2 ?C)  ?TempSrc:  Oral  Oral  ?SpO2: 97% 98%  98%  ?Weight:      ?Height:      ? ? ?GEN- The patient is well appearing, alert and oriented x 3 today.   ?HEENT: normocephalic, atraumatic; sclera clear, conjunctiva pink; hearing intact; oropharynx clear; neck supple, no JVP ?Lymph- no cervical lymphadenopathy ?Lungs- Clear to ausculation bilaterally, normal work of breathing.  No wheezes, rales, rhonchi ?Heart- Regular rate and rhythm, no murmurs, rubs or gallops, PMI not laterally displaced ?GI- soft, non-tender, non-distended, bowel sounds present, no hepatosplenomegaly ?Extremities- no clubbing, cyanosis, or edema; DP/PT/radial pulses 2+ bilaterally ?MS- no significant deformity or atrophy ?Skin- warm and dry, no rash or lesion, left chest without hematoma/ecchymosis ?Psych- euthymic mood, full affect ?Neuro- strength and sensation are intact ? ? ?Labs: ?  ?Lab Results  ?Component Value Date  ? WBC 5.3 05/20/2021  ? HGB 11.6 (L) 05/20/2021  ? HCT 35.5 (L) 05/20/2021  ? MCV 81.8 05/20/2021  ? PLT 124 (L) 05/20/2021  ?  ?Recent Labs  ?Lab 05/20/21 ?0241  ?NA 136  ?K 4.2  ?CL 103  ?CO2 24  ?BUN 19  ?CREATININE 1.02  ?CALCIUM 9.3  ?GLUCOSE 265*  ? ? ?Discharge Medications:  ?Allergies as of 05/22/2021   ? ?   Reactions  ? Ace Inhibitors Other (See Comments)  ? angioedema  ? ?  ? ?  ?  Medication List  ?  ? ?TAKE these medications   ? ?Accu-Chek Aviva Plus test strip ?Generic drug: glucose blood ?TEST TWO TIMES DAILY AS NEEDED AS DIRECTED ?What changed: See the new instructions. ?  ?Accu-Chek Aviva Plus w/Device Kit ?1 each by Does not apply route 2 (two) times daily. Use as directed ?  ?Accu-Chek FastClix Lancets Misc ?1 each by Subdermal route 2 (two) times daily. ?  ?Accu-Chek Guide Control Liqd ?1 each by In Vitro route as  needed. ?What changed: reasons to take this ?  ?acetaminophen 325 MG tablet ?Commonly known as: TYLENOL ?Take 2 tablets (650 mg total) by mouth every 4 (four) hours as needed for headache or mild pain. ?  ?allopurinol 300 MG tablet ?Commonly known as: ZYLOPRIM ?Take 1 tablet (300 mg total) by mouth daily. ?  ?amLODipine 10 MG tablet ?Commonly known as: NORVASC ?Take 1 tablet (10 mg total) by mouth daily. ?  ?aspirin EC 81 MG tablet ?Take 1 tablet (81 mg total) by mouth daily. Swallow whole. ?What changed: when to take this ?  ?atorvastatin 40 MG tablet ?Commonly known as: LIPITOR ?Take 1 tablet (40 mg total) by mouth at bedtime. ?What changed: when to take this ?  ?B-D SINGLE USE SWABS REGULAR Pads ?1 each by Does not apply route as needed. ?  ?ferrous sulfate 325 (65 FE) MG tablet ?Take 325 mg by mouth daily. ?  ?FISH OIL PO ?Take 1 capsule by mouth 2 (two) times daily. ?  ?furosemide 20 MG tablet ?Commonly known as: Lasix ?Take 1 tablet (20 mg total) by mouth daily. ?  ?glipiZIDE 5 MG 24 hr tablet ?Commonly known as: GLUCOTROL XL ?Take 1 tablet (5 mg total) by mouth daily with breakfast. ?  ?latanoprost 0.005 % ophthalmic solution ?Commonly known as: XALATAN ?Place 1 drop into the left eye at bedtime. ?  ?metFORMIN 750 MG 24 hr tablet ?Commonly known as: GLUCOPHAGE-XR ?Take 2 tablets (1,500 mg total) by mouth daily with breakfast. ?  ?polyethylene glycol 17 g packet ?Commonly known as: MIRALAX / GLYCOLAX ?Take 17 g by mouth at bedtime. ?  ?tadalafil 10 MG tablet ?Commonly known as: CIALIS ?Take 1-2 tablets (10-20 mg total) by mouth every three (3) days as needed for erectile dysfunction. ?  ?tamsulosin 0.4 MG Caps capsule ?Commonly known as: FLOMAX ?Take 1 capsule (0.4 mg total) by mouth daily. ?  ?VITAMIN D3 PO ?Take 1 tablet by mouth every Monday, Wednesday, and Friday. ?  ? ?  ? ? ?Disposition:  ? ? Follow-up Information   ? ? [facility] Follow up.   ?Why: on  3/22 at 320 pm for post pacemaker wound check ?Contact information: ?[address] ?[place] [address] ?[phone] ? ?  ?  ? ?  ?  ? ?  ? ? ?Duration of Discharge Encounter: Greater than 30 minutes including physician time. ? ?Signed, ?Shirley Friar, PA-C  ?05/22/2021 ?7:41 AM ? ? ?

## 2021-05-22 NOTE — Discharge Instructions (Signed)
After Your Pacemaker ? ? ?You have a Medtronic Pacemaker ? ?ACTIVITY ?Do not lift your arm above shoulder height for 1 week after your procedure. After 7 days, you may progress as below.  ?You should remove your sling 24 hours after your procedure, unless otherwise instructed by your provider.  ? ? ? Wednesday May 29, 2021  Thursday May 30, 2021 Friday May 31, 2021 Saturday June 01, 2021  ? ?Do not lift, push, pull, or carry anything over 10 pounds with the affected arm until 6 weeks (Wednesday July 03, 2021 ) after your procedure.  ? ?You may drive AFTER your wound check, unless you have been told otherwise by your provider.  ? ?Ask your healthcare provider when you can go back to work ? ? ?INCISION/Dressing ?If you are on a blood thinner such as Coumadin, Xarelto, Eliquis, Plavix, or Pradaxa please confirm with your provider when this should be resumed.  ? ?If large square, outer bandage is left in place, this can be removed after 24 hours from your procedure. Do not remove steri-strips or glue as below.  ? ?Monitor your Pacemaker site for redness, swelling, and drainage. Call the device clinic at [phone] if you experience these symptoms or fever/chills. ? ?If your incision is sealed with Steri-strips or staples, you may shower 10 days after your procedure or when told by your provider. Do not remove the steri-strips or let the shower hit directly on your site. You may wash around your site with soap and water.   ? ?If you were discharged in a sling, please do not wear this during the day more than 48 hours after your surgery unless otherwise instructed. This may increase the risk of stiffness and soreness in your shoulder.  ? ?Avoid lotions, ointments, or perfumes over your incision until it is well-healed. ? ?You may use a hot tub or a pool AFTER your wound check appointment if the incision is completely closed. ? ?Pacemaker Alerts:  Some alerts are vibratory and others beep. These are NOT  emergencies. Please call our office to let us know. If this occurs at night or on weekends, it can wait until the next business day. Send a remote transmission. ? ?If your device is capable of reading fluid status (for heart failure), you will be offered monthly monitoring to review this with you.  ? ?DEVICE MANAGEMENT ?Remote monitoring is used to monitor your pacemaker from home. This monitoring is scheduled every 91 days by our office. It allows [name] to keep an eye on the functioning of your device to ensure it is working properly. You will routinely see your Electrophysiologist annually (more often if necessary).  ? ?You should receive your ID card for your new device in 4-8 weeks. Keep this card with you at all times once received. Consider wearing a medical alert bracelet or necklace. ? ?Your Pacemaker may be MRI compatible. This will be discussed at your next office visit/wound check.  You should avoid contact with strong electric or magnetic fields.  ? ?Do not use amateur (ham) radio equipment or electric (arc) welding torches. MP3 player headphones with magnets should not be used. Some devices are safe to use if held at least 12 inches (30 cm) from your Pacemaker. These include power tools, lawn mowers, and speakers. If you are unsure if something is safe to use, ask your health care provider. ? ?When using your cell phone, hold it to the ear that is on the opposite side from the  Pacemaker. Do not leave your cell phone in a pocket over the Pacemaker. ? ?You may safely use electric blankets, heating pads, computers, and microwave ovens. ? ?Call the office right away if: ?You have chest pain. ?You feel more short of breath than you have felt before. ?You feel more light-headed than you have felt before. ?Your incision starts to open up. ? ?This information is not intended to replace advice given to you by your health care provider. Make sure you discuss any questions you have with your health care provider.  ?

## 2021-05-22 NOTE — Care Management Important Message (Signed)
Important Message ? ?Patient Details  ?Name: Jordan [name] ?MRN: [id number] ?Date of Birth: Sep 24, 1940 ? ? ?Medicare Important Message Given:  Yes ? ? ? ? ?Shelda Altes ?05/22/2021, 10:41 AM ?

## 2021-05-22 NOTE — Plan of Care (Signed)

## 2021-05-31 ENCOUNTER — Encounter (INDEPENDENT_AMBULATORY_CARE_PROVIDER_SITE_OTHER): Payer: Self-pay

## 2021-05-31 ENCOUNTER — Ambulatory Visit: Payer: Medicare HMO | Admitting: Physician Assistant

## 2021-05-31 ENCOUNTER — Other Ambulatory Visit: Payer: Self-pay

## 2021-05-31 ENCOUNTER — Ambulatory Visit (INDEPENDENT_AMBULATORY_CARE_PROVIDER_SITE_OTHER): Payer: Medicare HMO

## 2021-05-31 VITALS — BP 130/62 | HR 104 | Ht 69.0 in | Wt 288.2 lb

## 2021-05-31 DIAGNOSIS — N2889 Other specified disorders of kidney and ureter: Secondary | ICD-10-CM | POA: Diagnosis not present

## 2021-05-31 DIAGNOSIS — Z950 Presence of cardiac pacemaker: Secondary | ICD-10-CM | POA: Diagnosis not present

## 2021-05-31 DIAGNOSIS — Z952 Presence of prosthetic heart valve: Secondary | ICD-10-CM

## 2021-05-31 DIAGNOSIS — I10 Essential (primary) hypertension: Secondary | ICD-10-CM

## 2021-05-31 DIAGNOSIS — I442 Atrioventricular block, complete: Secondary | ICD-10-CM | POA: Diagnosis not present

## 2021-05-31 DIAGNOSIS — E782 Mixed hyperlipidemia: Secondary | ICD-10-CM | POA: Diagnosis not present

## 2021-05-31 LAB — CUP PACEART INCLINIC DEVICE CHECK
Battery Remaining Longevity: 147 mo
Battery Voltage: 3.21 V
Brady Statistic AP VP Percent: 2.98 %
Brady Statistic AP VS Percent: 0.01 %
Brady Statistic AS VP Percent: 91.77 %
Brady Statistic AS VS Percent: 5.24 %
Brady Statistic RA Percent Paced: 3.01 %
Brady Statistic RV Percent Paced: 94.75 %
Date Time Interrogation Session: 20230317133208
Implantable Lead Implant Date: 20230307
Implantable Lead Implant Date: 20230307
Implantable Lead Location: 753859
Implantable Lead Location: 753860
Implantable Lead Model: 3830
Implantable Lead Model: 5076
Implantable Pulse Generator Implant Date: 20230307
Lead Channel Impedance Value: 418 Ohm
Lead Channel Impedance Value: 475 Ohm
Lead Channel Impedance Value: 646 Ohm
Lead Channel Impedance Value: 646 Ohm
Lead Channel Pacing Threshold Amplitude: 0.5 V
Lead Channel Pacing Threshold Amplitude: 0.875 V
Lead Channel Pacing Threshold Pulse Width: 0.4 ms
Lead Channel Pacing Threshold Pulse Width: 0.4 ms
Lead Channel Sensing Intrinsic Amplitude: 23.625 mV
Lead Channel Sensing Intrinsic Amplitude: 24.25 mV
Lead Channel Sensing Intrinsic Amplitude: 4.625 mV
Lead Channel Sensing Intrinsic Amplitude: 7.25 mV
Lead Channel Setting Pacing Amplitude: 3.5 V
Lead Channel Setting Pacing Amplitude: 3.5 V
Lead Channel Setting Pacing Pulse Width: 0.4 ms
Lead Channel Setting Sensing Sensitivity: 1.2 mV

## 2021-05-31 IMAGING — CT CT ANGIOGRAPHY CHEST
3 of 6 series · 18 of 36 positions shown · IV contrast (omnipaque)
Comparison: None.

CLINICAL DATA: 78-year-old male with aortic stenosis

EXAM:
CT ANGIOGRAPHY CHEST WITH CONTRAST
TECHNIQUE: Multidetector CT imaging of the chest was performed using the
standard protocol during bolus administration of intravenous
contrast. Multiplanar CT image reconstructions and MIPs were
obtained to evaluate the vascular anatomy.
CONTRAST:  100mL OMNIPAQUE IOHEXOL 350 MG/ML SOLN

[Series 4: axial cta thorax · axial · 0.81mm/px · z∈[-1202,-904]mm · 12 of 177 slices shown]
[im 14/177  lung]
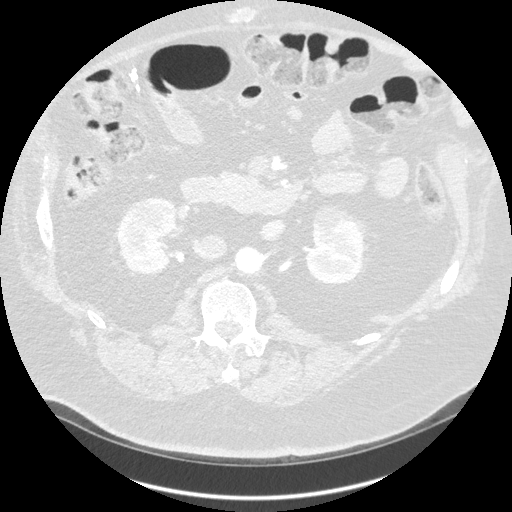
[im 28/177  mediastinal]
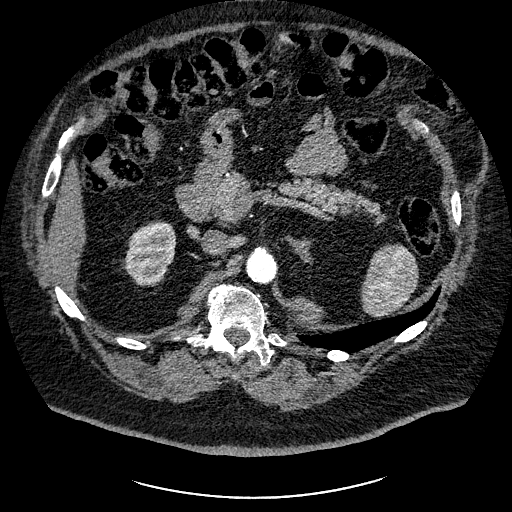
[im 41/177  lung]
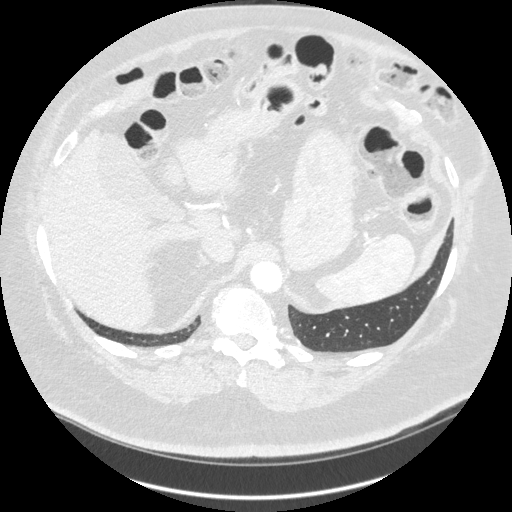
[im 55/177  mediastinal]
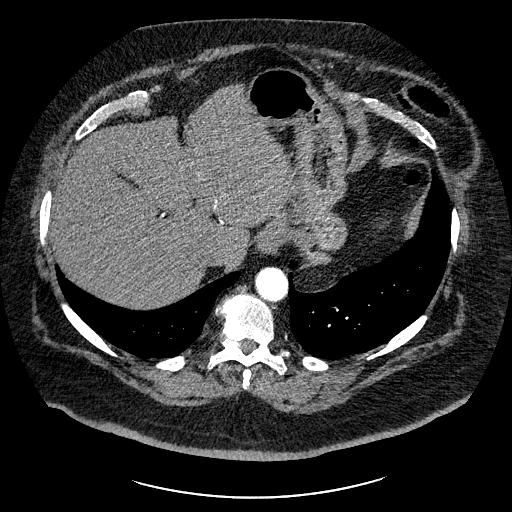
[im 68/177  lung]
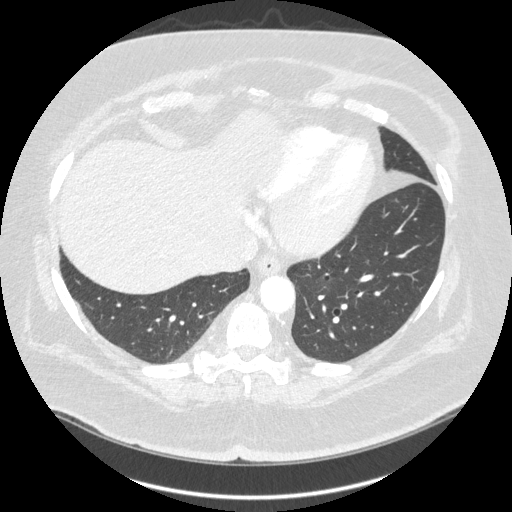
[im 82/177  mediastinal]
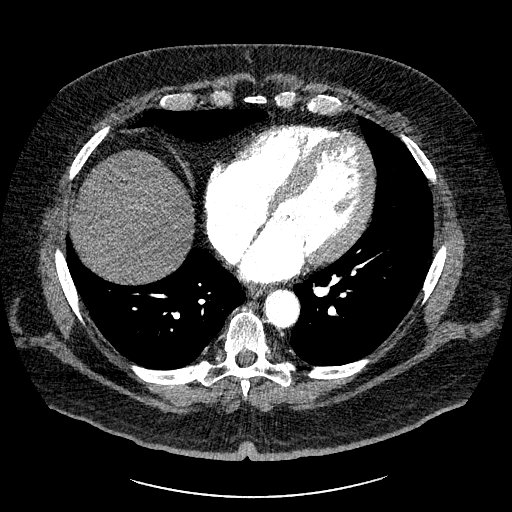
[im 95/177  lung]
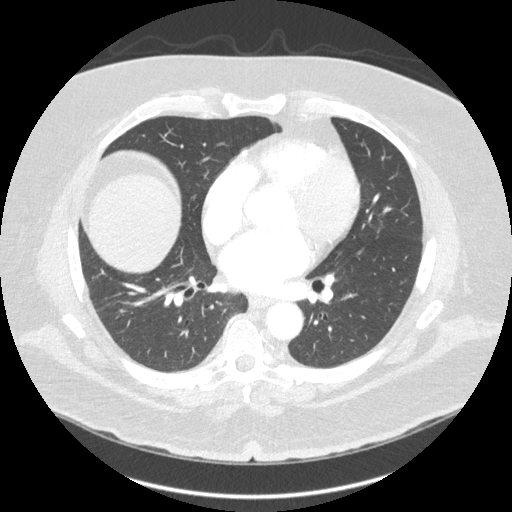
[im 109/177  mediastinal]
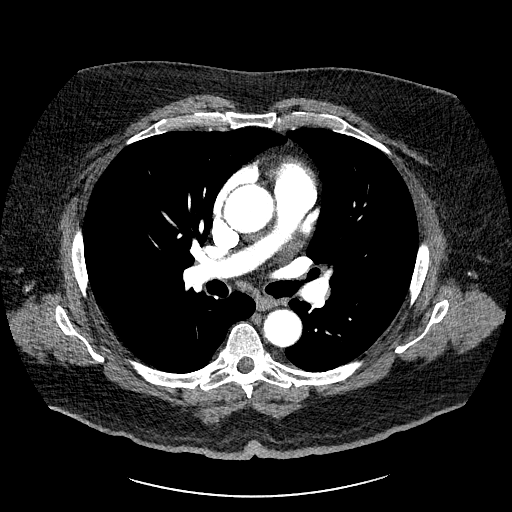
[im 122/177  lung]
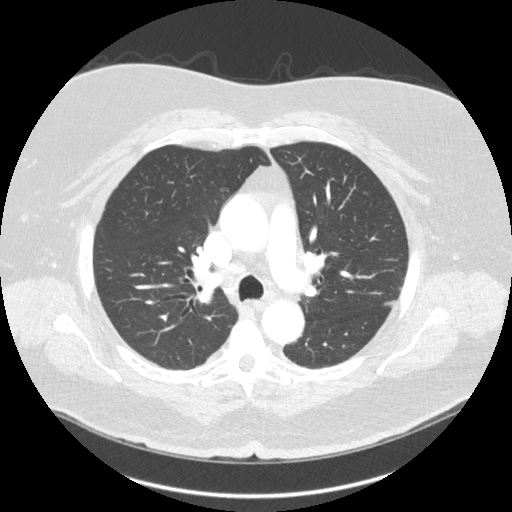
[im 136/177  mediastinal]
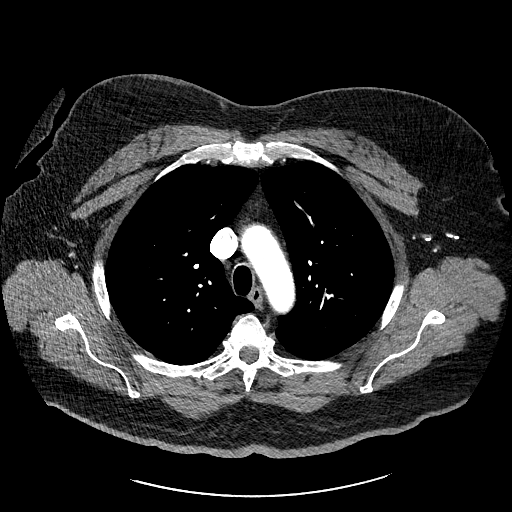
[im 149/177  lung]
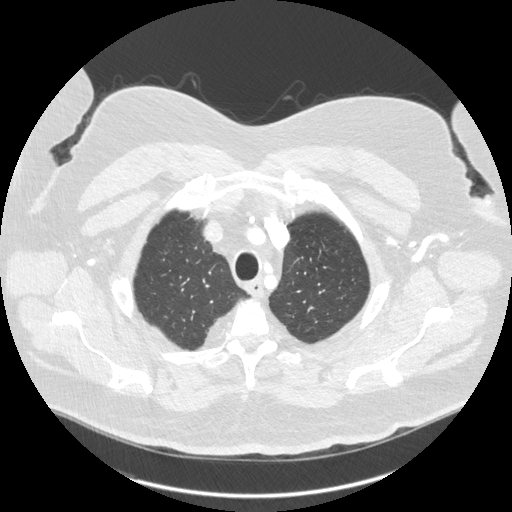
[im 163/177  mediastinal]
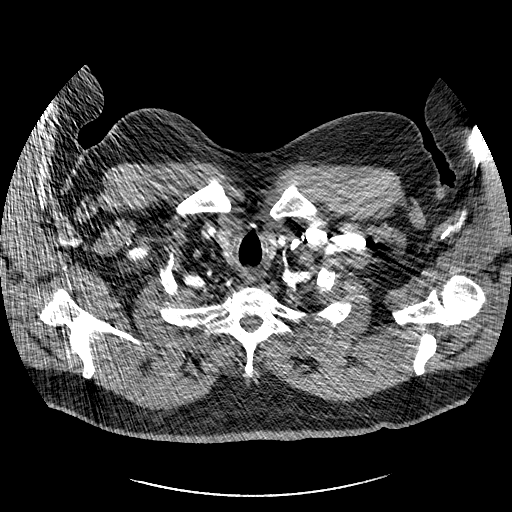

[Series 5: axial lung cta thorax · axial · 0.81mm/px · z∈[-1202,-1040]mm · 5 of 177 slices shown]
[im 14/177  mediastinal]
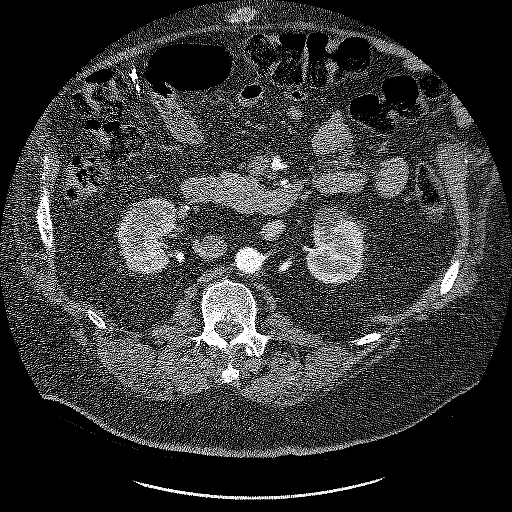
[im 41/177  mediastinal]
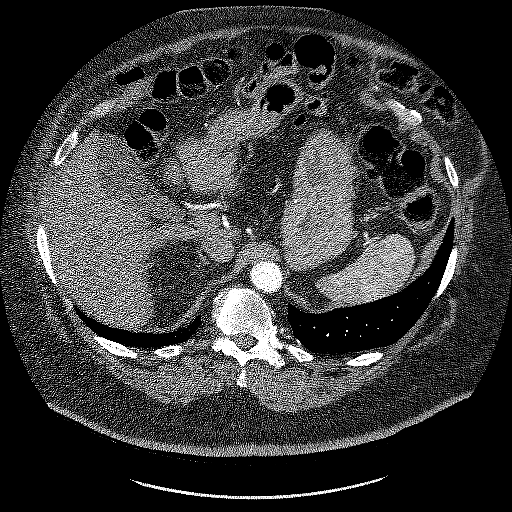
[im 55/177  mediastinal]
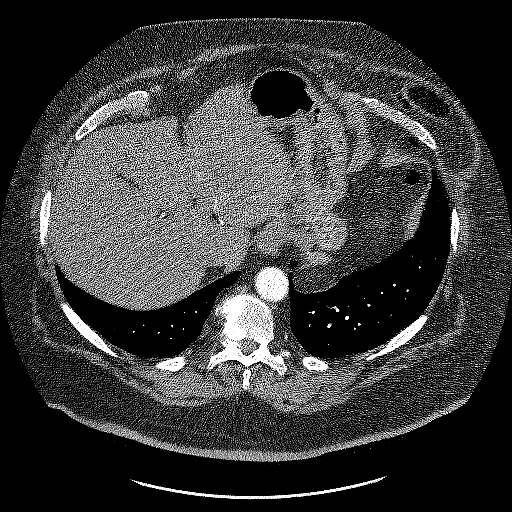
[im 82/177  mediastinal]
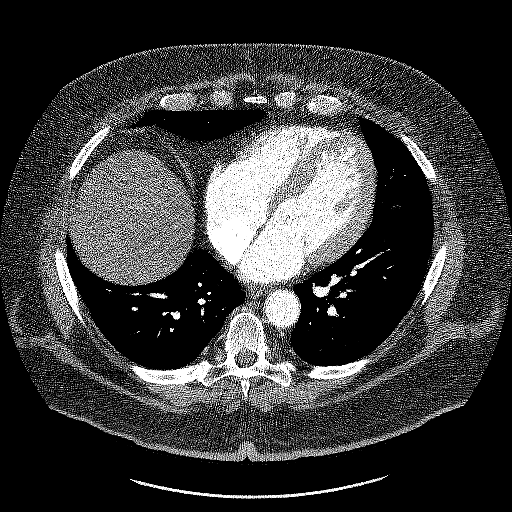
[im 95/177  mediastinal]
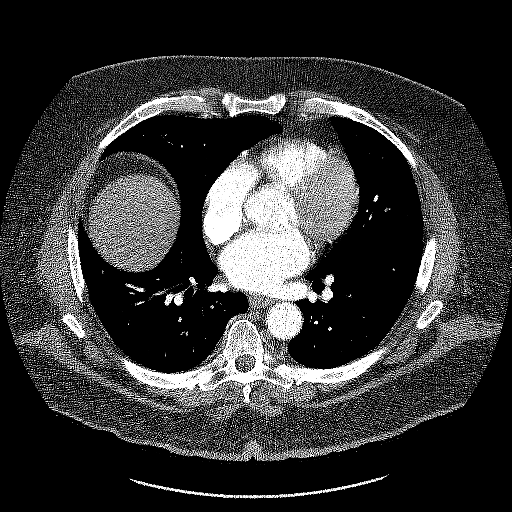

[Series 7: cor st cta thorax · coronal · 0.69mm/px · 1 of 187 slices shown]
[im 94/187  mediastinal]
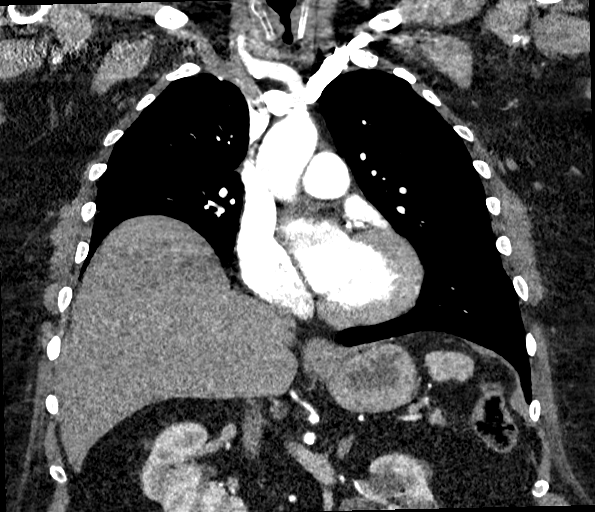

[18 of 36 positions shown; findings below may reference images not displayed]

FINDINGS: Cardiovascular: Conventional 3 vessel arch anatomy. Calcifications
are visualized along the thoracic aorta. The aortic root is normal
in caliber. No effacement of the Brian Emanuel junction. Mild ectasia
of the tubular portion of the ascending thoracic aorta without
aneurysmal dilatation. No evidence of dissection. The aortic valve
is thickened and calcified. The heart is normal in size.
Calcifications also present throughout the coronary arteries
including the left anterior descending, circumflex and right. No
pericardial effusion. No central pulmonary embolus.

Mediastinum/Nodes: Unremarkable CT appearance of the thyroid gland.
No suspicious mediastinal or hilar adenopathy. No soft tissue
mediastinal mass. The thoracic esophagus is unremarkable.

Lungs/Pleura: Lungs are clear. No pleural effusion or pneumothorax.
Tiny 3 mm peripheral right lower lobe pulmonary nodule noted
incidentally.

Upper Abdomen: Incompletely imaged horseshoe kidney with multiple
low-attenuation lesions most consistent with renal cysts. No acute
abnormality in the upper abdomen.

Musculoskeletal: No acute fracture or aggressive appearing lytic or
blastic osseous lesion.

Review of the MIP images confirms the above findings.
IMPRESSION: 1. No evidence of aortic aneurysm or dissection.
2. Calcified and thickened aortic valve consistent with the clinical
history of aortic stenosis.
3. Incidental note is made of a tiny 3 mm right lower lobe pulmonary
nodule. No follow-up needed if patient is low-risk. Non-contrast
chest CT can be considered in 12 months if patient is high-risk.
This recommendation follows the consensus statement: Guidelines for
Management of Incidental Pulmonary Nodules Detected on CT Images:
4.  Aortic Atherosclerosis (VTXRB-170.0).
5. Incompletely imaged horseshoe kidney with multiple
low-attenuation lesions likely representing renal cysts.

## 2021-05-31 MED ORDER — AMOXICILLIN 500 MG PO TABS: 2000.0000 mg | ORAL_TABLET | ORAL | 12 refills | Status: DC

## 2021-05-31 NOTE — Patient Instructions (Signed)

## 2021-05-31 NOTE — Progress Notes (Signed)
?[facility]   ?[place] ?                                    ?Cardiology Office Note:   ? ?Date:  05/31/2021  ? ?ID:  Jordan [name], DOB 06-25-1940, MRN [id number] ? ?PCP:  Steele Sizer, MD  ?[facility] Cardiologist:  Ida Rogue, MD / Dr.Cooper, MD & Dr. Cyndia Bent, MD (TAVR)  ?[place] Electrophysiologist:  None  ? ?Referring MD: Steele Sizer, MD  ? ?[facility] s/p TAVR and PPM  ? ?History of Present Illness:   ? ?Jordan [name] is a 81 y.o. 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 ? ?He is followed by Dr. Rockey Situ for his cardiology care. Echocardiogram from 2014 showed normal LVEF with mild aortic stenosis. He had many repeat studies over the course of several years that showed slow progression. Repeat echocardiogram 02/2021 showed an EF at 50-55% with AVA by VTI 1.37cm2, mean gradient 57mHg, peak 54.58mg. He was then referred to Dr. CoBurt Knack2/30/22 for the evaluation of severe, symptomatic aortic stenosis. At that time, he reported progressive exertional dyspnea with low-level activities. He was previously able to walk an hour per day for exercise.  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 Otherwise, there was mild-moderate, non-obstructive coronary artery disease. Plan for for continued medical management of his CAD including aggressive secondary prevention.  ?  ?He was evaluated by the multidisciplinary valve team and underwent successful TAVR with a 2962mdwards Sapien 3 THV via the TF approach on 05/14/21. Post operative echo showed EF 60%, normally functioning TAVR with a mean gradient of 4 mmHg and no PVL. 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 TayLovena Le? ?Today the patient presents to clinic for follow up. Here with his niece today and doing well with no complaints other than wanting to drive. No CP or SOB. No LE edema, orthopnea or PND. No dizziness or syncope. No blood in stool or urine. No palpitations.  ? ? ? ?Past Medical History:  ?Diagnosis Date  ? Aortic stenosis   ? Chronic kidney disease, stage II (mild)   ? Decreased libido   ? Diabetes mellitus without complication ([place] ? Glaucoma   ? Gout 2009  ? Heart murmur   ? Hernia [id number] Hyperlipidemia   ? Hypertension   ? Inguinal hernia without mention of obstruction or gangrene, unilateral or unspecified, (not specified as recurrent)   ? Lumbago   ? Obesity   ? 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 s/p TAVR with a 29 mm Edwards S3UR via the TF approach by Dr. CooBurt Knackd Dr. BarCyndia Bent Unspecified constipation   ? ? ?Past Surgical History:  ?Procedure Laterality Date  ? cataract surgery    ? COLONOSCOPY  03/17/2010  ? COLONOSCOPY N/A 04/22/2019  ? Procedure: COLONOSCOPY;  Surgeon: ManRush LandmarkbTelford NabMD;  [facility];  Service: Gastroenterology;  Laterality: N/A;  ? EYE SURGERY  03/17/2009  ?  cataract  ? HERNIA REPAIR    ? INTRAOPERATIVE TRANSTHORACIC ECHOCARDIOGRAM N/A 05/14/2021  ? Procedure: INTRAOPERATIVE TRANSTHORACIC ECHOCARDIOGRAM;  Surgeon: Sherren Mocha, MD;  Location: [place] [facility];  Service: Open Heart Surgery;  Laterality: N/A;  ? 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  ? Procedure: PACEMAKER IMPLANT;  Surgeon: Evans Lance, MD;  Location: [place] [facility];  Service: Cardiovascular;  Laterality: N/A;  ? RIGHT HEART CATH AND CORONARY ANGIOGRAPHY Bilateral 02/15/2021  ? Procedure: RIGHT HEART CATH AND CORONARY ANGIOGRAPHY;  Surgeon: Nelva Bush, MD;  Location: [place] [facility];  Service: Cardiovascular;  Laterality:  Bilateral;  ? TRANSCATHETER AORTIC VALVE REPLACEMENT, TRANSFEMORAL N/A 05/14/2021  ? Procedure: Transcatheter Aortic Valve Replacement, Transfemoral;  Surgeon: Sherren Mocha, MD;  Location: [place] [facility];  Service: Open Heart Surgery;  Laterality: N/A;  ? UMBILICAL HERNIA REPAIR  03/17/2009  ? ? ?Current Medications: ?No outpatient medications have been marked as taking for the 05/31/21 encounter (Appointment) with [facility].  ?  ? ?Allergies:   Ace inhibitors  ? ?Social History  ? ?Socioeconomic History  ? Marital status: Widowed  ?  Spouse name: Not on file  ? Number of children: 2  ? Years of education: Not on file  ? Highest education level: Some college, no degree  ?Occupational History  ? Occupation: retired   ?  Comment: used to [employer]   ?Tobacco Use  ? Smoking status: Former  ?  Packs/day: 1.00  ?  Years: 10.00  ?  Pack years: 10.00  ?  Types: Cigarettes  ?  Start date: 03/17/1965  ?  Quit date: 11/27/1975  ?  Years since quitting: 45.5  ? Smokeless tobacco: Never  ? Tobacco comments:  ?  quit 40 years  ?Vaping Use  ? Vaping Use: Never used  ?Substance and Sexual Activity  ? Alcohol use: Yes  ?  Alcohol/week: 1.0 standard drink  ?  Types: 1 Standard drinks or equivalent per week  ?  Comment: socially - 1 x year  ? Drug use: No  ? Sexual activity: Yes  ?  Partners: Female  ?Other Topics Concern  ? Not on file  ?Social History Narrative  ? Lives by himself in [place]  ? Two grown children, daughter in [place] and son in [place]  ? He goes to [place] to visit his sisters often   ? ?Social Determinants of Health  ? ?Financial Resource Strain: Low Risk   ? Difficulty of Paying Living Expenses: Not hard at all  ?Food Insecurity: No Food Insecurity  ? Worried About Charity fundraiser in the Last Year: Never true  ? Ran Out of Food in the Last Year: Never true  ?Transportation Needs: No Transportation Needs  ? Lack of Transportation (Medical): No  ? Lack of  Transportation (Non-Medical): No  ?Physical Activity: Sufficiently Active  ? Days of Exercise per Week: 5 days  ? Minutes of Exercise per Session: 30 min  ?Stress: No Stress Concern Present  ? Feeling of Stress : Not at all  ?Social Connections: Moderately Integrated  ? Frequency of Communication with Friends and Family: More than three times a week  ? Frequency of Social Gatherings with Friends and Family: More than three times a week  ? Attends Religious Services: More than 4 times per year  ? Active Member of Clubs or Organizations: Yes  ? Attends Archivist Meetings: More than 4 times per year  ? Marital Status: Widowed  ?  ? ?  Family History: ?The patient's family history includes Diabetes in his brother, sister, and sister; Heart disease in his father; Hypertension in his father; Prostate cancer in his brother. There is no history of Kidney disease, Kidney cancer, Bladder Cancer, Colon cancer, Esophageal cancer, Inflammatory bowel disease, Liver disease, Pancreatic cancer, Rectal cancer, or Stomach cancer. ? ?ROS:   ?Please see the history of present illness.    ?All other systems reviewed and are negative. ? ?EKGs/Labs/Other Studies Reviewed:   ? ?The following studies were reviewed today: ? ?TAVR OPERATIVE NOTE ?  ?  ?Date of Procedure:                05/14/2021 ?  ?Preoperative Diagnosis:      Severe Aortic Stenosis  ?  ?Postoperative Diagnosis:    Same  ?  ?Procedure:      ?  ?Transcatheter Aortic Valve Replacement - Percutaneous Left Transfemoral Approach ?            Edwards Sapien 3 Ultra THV (size 29 mm, model # 9755RSL, serial # [id number]) ?             ?Co-Surgeons:                        Gaye Pollack, MD and Sherren Mocha, MD   ?  ?  ?Anesthesiologist:                  Renaldo Reel, MD ?  ?Echocardiographer:              Osborne Oman, MD ?  ?Pre-operative Echo Findings: ?Severe aortic stenosis ?Normal left ventricular systolic function ?  ?Post-operative Echo Findings: ?No paravalvular  leak ?Normal left ventricular systolic function ? ?_____________ ?  ? ?Echo 05/15/21: ?IMPRESSIONS ? 1. The aortic valve has been replaced by a 29 mm Sapien prosthetic (TAVR)  ?valve present in the aortic position. No AI/PVL

## 2021-05-31 NOTE — Patient Instructions (Signed)
Medication Instructions:  ? ?Your physician recommends that you continue on your current medications as directed. Please refer to the Current Medication list given to you today. ? ? ?*If you need a refill on your cardiac medications before your next appointment, please call your pharmacy* ? ? ?Lab Work: [facility] ? ? ?If you have labs (blood work) drawn today and your tests are completely normal, you will receive your results only by: ?MyChart Message (if you have MyChart) OR ?A paper copy in the mail ?If you have any lab test that is abnormal or we need to change your treatment, we will call you to review the results. ? ? ?Testing/Procedures: NONE ORDERED  TODAY ? ? ? ? ? ?Follow-Up: ?At [facility], you and your health needs are our priority.  As part of our continuing mission to provide you with exceptional heart care, we have created designated Provider Care Teams.  These Care Teams include your primary Cardiologist (physician) and Advanced Practice Providers (APPs -  Physician Assistants and Nurse Practitioners) who all work together to provide you with the care you need, when you need it. ? ?We recommend signing up for the patient portal called "MyChart".  Sign up information is provided on this After Visit Summary.  MyChart is used to connect with patients for Virtual Visits (Telemedicine).  Patients are able to view lab/test results, encounter notes, upcoming appointments, etc.  Non-urgent messages can be sent to your provider as well.   ?To learn more about what you can do with MyChart, go to [email or web address].   ? ?Your next appointment:  AS SCHEUDLED ?}  ? ?Other Instructions ? ?

## 2021-05-31 NOTE — Progress Notes (Signed)
Wound check appointment. Steri-strips removed. Wound without redness or edema. Incision edges approximated, wound well healed. Normal device function. Thresholds, sensing, and impedances consistent with implant measurements. RV lead noted oversensing. RV sensitivity programmed from 0.90 mV to 1.20 mV. PAV programmed from 180 ms to 240 ms. SAV programmed from 187m to 240 ms. changed were made by ARebecca Eaton Device programmed at 3.5V/auto capture programmed on for extra safety margin until 3 month visit. Histogram distribution appropriate for patient and level of activity. No mode switches. 9 HVR events logged, appear (1:1). Patient educated about wound care, arm mobility, lifting restrictions. ROV in 3 months with implanting physician. ?

## 2021-06-05 ENCOUNTER — Ambulatory Visit: Payer: Medicare HMO

## 2021-06-06 ENCOUNTER — Ambulatory Visit: Payer: Medicare HMO | Admitting: Podiatry

## 2021-06-10 ENCOUNTER — Ambulatory Visit: Payer: Medicare HMO | Admitting: Podiatry

## 2021-06-10 ENCOUNTER — Other Ambulatory Visit: Payer: Self-pay

## 2021-06-10 ENCOUNTER — Encounter: Payer: Self-pay | Admitting: Podiatry

## 2021-06-10 DIAGNOSIS — N1831 Chronic kidney disease, stage 3a: Secondary | ICD-10-CM | POA: Diagnosis not present

## 2021-06-10 DIAGNOSIS — M79676 Pain in unspecified toe(s): Secondary | ICD-10-CM

## 2021-06-10 DIAGNOSIS — E1142 Type 2 diabetes mellitus with diabetic polyneuropathy: Secondary | ICD-10-CM

## 2021-06-10 DIAGNOSIS — B351 Tinea unguium: Secondary | ICD-10-CM | POA: Diagnosis not present

## 2021-06-10 DIAGNOSIS — I739 Peripheral vascular disease, unspecified: Secondary | ICD-10-CM

## 2021-06-10 DIAGNOSIS — L84 Corns and callosities: Secondary | ICD-10-CM

## 2021-06-10 NOTE — Progress Notes (Signed)
Complaint:  ?Visit Type: Patient returns to my office for risk foot care.  This patient requires this care by a professional since this patient will be at a high risk due to having diabetes.  This patient is unable to cut his own nails since he cannot reach his nails.  This patient presents for at risk foot care today.  He also states that he has a painful corn on the second toe right foot. ? ?Podiatric Exam: ?Vascular: dorsalis pedis and posterior tibial pulses are  weakly palpable bilateral. Capillary return is immediate. Temperature gradient is WNL. Skin turgor WNL  ?Sensorium: Normal Semmes Weinstein monofilament test. Normal tactile sensation bilaterally. ?Nail Exam: Pt has thick disfigured discolored nails with subungual debris noted bilateral entire nail hallux through fifth toenails.  Nails are thick painful and deformed 1-5  B/L. ?Ulcer Exam: There is no evidence of ulcer or pre-ulcerative changes or infection. ?Orthopedic Exam: Muscle tone and strength are WNL. No limitations in general ROM. No crepitus or effusions noted. HAV  B/L. Hammer toes second  B/L ?Skin: No Porokeratosis. No infection or ulcers.  Corn second toe medial aspect @ PIPJ. Right foot. ? ?Diagnosis:  ?Onychomycosis, , Pain in right toe, pain in left toes, Corn/callus ? ?Treatment & Plan ?Procedures and Treatment: Consent by patient was obtained for treatment procedures. The patient understood the discussion of treatment and procedures well. All questions were answered thoroughly reviewed. Debridement of mycotic and hypertrophic toenails, 1 through 5 bilateral and clearing of subungual debris. No ulceration, no infection noted.   Told this patient to return for periodic foot evaluation to help reduce potential of at risk complications.  Gave him  pads due to corn. ?Return Visit-Office Procedure: Patient instructed to return to the office for a follow up visit 3 months for continued evaluation and treatment. ? ? ? ?Gardiner Barefoot DPM ?

## 2021-06-18 ENCOUNTER — Inpatient Hospital Stay: Admission: RE | Admit: 2021-06-18 | Payer: Medicare HMO | Source: Ambulatory Visit

## 2021-06-19 ENCOUNTER — Other Ambulatory Visit (HOSPITAL_COMMUNITY): Payer: Medicare HMO

## 2021-06-19 ENCOUNTER — Ambulatory Visit: Payer: Medicare HMO

## 2021-06-25 NOTE — Progress Notes (Signed)
?[facility]   ?[place] ?                                    ?Cardiology Office Note:   ? ?Date:  06/26/2021  ? ?ID:  Jordan [name], DOB 04-19-1940, MRN [id number] ? ?PCP:  Steele Sizer, MD  ?[facility] Cardiologist:  Ida Rogue, MD/ Dr.Cooper, MD & Dr. Cyndia Bent, MD (TAVR)  ?[place] Electrophysiologist:  None  ? ?Referring MD: Steele Sizer, MD  ? ?Chief Complaint  ?Patient presents with  ? Follow-up  ?  1 month s/p TAVR  ? ?History of Present Illness:   ? ?Jordan [name] is a 81 y.o. 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 ?  ?He is followed by Dr. Rockey Situ for his cardiology care. Echocardiogram from 2014 showed normal LVEF with mild aortic stenosis. He had many repeat studies over the course of several years that showed slow progression. Repeat echocardiogram 02/2021 showed an EF at 50-55% with AVA by VTI 1.37cm2, mean gradient 7mHg, peak 54.528mg. He was then referred to Dr. CoBurt Knack2/30/22 for the evaluation of severe, symptomatic aortic stenosis. At that time, he reported progressive exertional dyspnea with low-level activities. He was previously able to walk an hour per day for exercise.  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 Otherwise, there was mild-moderate, non-obstructive coronary artery disease. Plan for for continued medical management of his CAD including aggressive secondary prevention.  ?  ?He was evaluated by the multidisciplinary valve team and underwent successful TAVR with a 2967mdwards Sapien 3 THV via the TF approach on 05/14/21. Post operative echo showed EF 60%, normally functioning TAVR with a mean gradient of 4 mmHg and no PVL. 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 TayLovena Le?  ?He was then seen by myself in follow and was doing well. Today he continues to do well from a CV standpoint. He is able to walk thirty minutes per day/5 days a week with no issues. [place] have spaced out a bit. He denies CP or SOB. No LE edema, orthopnea or PND. No dizziness or syncope. No blood in stool or urine. No palpitations.  ?  ?Past Medical History:  ?Diagnosis Date  ? Aortic stenosis   ? Chronic kidney disease, stage II (mild)   ? Decreased libido   ? Diabetes [place] without complication ([place] ? Glaucoma   ? Gout 2009  ? Heart murmur   ? Hernia [id number] Hyperlipidemia   ? Hypertension   ? Inguinal hernia without mention of obstruction or gangrene, unilateral or unspecified, (not specified as recurrent)   ? Lumbago   ? Obesity   ? 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 s/p TAVR with a 29 mm Edwards S3UR via the TF approach by Dr. CooBurt Knackd Dr. BarCyndia Bent Unspecified constipation   ? ? ?Past Surgical  History:  ?Procedure Laterality Date  ? cataract surgery    ? COLONOSCOPY  03/17/2010  ? COLONOSCOPY N/A 04/22/2019  ? Procedure: COLONOSCOPY;  Surgeon: Rush Landmark Telford Nab., MD;  Location: [facility];  Service: Gastroenterology;  Laterality: N/A;  ? EYE SURGERY  03/17/2009  ? cataract  ? HERNIA REPAIR    ? INTRAOPERATIVE TRANSTHORACIC ECHOCARDIOGRAM N/A 05/14/2021  ? Procedure: INTRAOPERATIVE TRANSTHORACIC ECHOCARDIOGRAM;  Surgeon: Sherren Mocha, MD;  Location: [place] [facility];  Service: Open Heart Surgery;  Laterality: N/A;  ? 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  ? Procedure: PACEMAKER IMPLANT;  Surgeon: Evans Lance, MD;  Location: [place] [facility];  Service: Cardiovascular;  Laterality: N/A;  ? RIGHT  HEART CATH AND CORONARY ANGIOGRAPHY Bilateral 02/15/2021  ? Procedure: RIGHT HEART CATH AND CORONARY ANGIOGRAPHY;  Surgeon: Nelva Bush, MD;  Location: [place] [facility];  Service: Cardiovascular;  Laterality: Bilateral;  ? TRANSCATHETER AORTIC VALVE REPLACEMENT, TRANSFEMORAL N/A 05/14/2021  ? Procedure: Transcatheter Aortic Valve Replacement, Transfemoral;  Surgeon: Sherren Mocha, MD;  Location: [place] [facility];  Service: Open Heart Surgery;  Laterality: N/A;  ? UMBILICAL HERNIA REPAIR  03/17/2009  ? ? ?Current Medications: ?Current Meds  ?Medication Sig  ? ACCU-CHEK AVIVA PLUS test strip TEST TWO TIMES DAILY AS NEEDED AS DIRECTED  ? Accu-Chek FastClix Lancets MISC 1 each by Subdermal route 2 (two) times daily.  ? acetaminophen (TYLENOL) 325 MG tablet Take 2 tablets (650 mg total) by mouth every 4 (four) hours as needed for headache or mild pain.  ? Alcohol Swabs (B-D SINGLE USE SWABS REGULAR) PADS 1 each by Does not apply route as needed.  ? allopurinol (ZYLOPRIM) 300 MG tablet Take 1 tablet (300 mg total) by mouth daily.  ? amLODipine (NORVASC) 10 MG tablet Take 1 tablet (10 mg total) by mouth daily.  ? amoxicillin (AMOXIL) 500 MG tablet Take 4 tablets (2,000 mg total) by mouth as directed. 1 hour prior to dental work including cleanings  ? aspirin EC 81 MG tablet Take 1 tablet (81 mg total) by mouth daily. Swallow whole.  ? atorvastatin (LIPITOR) 40 MG tablet Take 1 tablet (40 mg total) by mouth at bedtime.  ? Blood Glucose Calibration (ACCU-CHEK GUIDE CONTROL) LIQD 1 each by In Vitro route as needed. (Patient taking differently: 1 each by In Vitro route as needed (blood glucose).)  ? Blood Glucose Monitoring Suppl (ACCU-CHEK AVIVA PLUS) w/Device KIT 1 each by Does not apply route 2 (two) times daily. Use as directed  ? Cholecalciferol (VITAMIN D3 PO) Take 1 tablet by mouth every Monday, Wednesday, and Friday.  ? ferrous sulfate 325 (65 FE) MG tablet Take 325 mg by mouth daily.  ? furosemide (LASIX)  20 MG tablet Take 1 tablet (20 mg total) by mouth daily.  ? glipiZIDE (GLUCOTROL XL) 5 MG 24 hr tablet Take 1 tablet (5 mg total) by mouth daily with breakfast.  ? latanoprost (XALATAN) 0.005 % ophthalmic solution Place 1 drop into the left eye at bedtime.   ? metFORMIN (GLUCOPHAGE-XR) 750 MG 24 hr tablet Take 2 tablets (1,500 mg total) by mouth daily with breakfast.  ? Omega-3 Fatty Acids (FISH OIL PO) Take 1 capsule by mouth 2 (two) times daily.   ? polyethylene glycol (MIRALAX / GLYCOLAX) packet Take 17 g by mouth at bedtime.   ? tadalafil (CIALIS) 10 MG tablet Take 1-2 tablets (10-20 mg total) by mouth every three (3) days as needed for erectile dysfunction.  ? tamsulosin (FLOMAX) 0.4 MG  CAPS capsule Take 1 capsule (0.4 mg total) by mouth daily.  ?  ? ?Allergies:   Ace inhibitors  ? ?Social History  ? ?Socioeconomic History  ? Marital status: Widowed  ?  Spouse name: Not on file  ? Number of children: 2  ? Years of education: Not on file  ? Highest education level: Some college, no degree  ?Occupational History  ? Occupation: retired   ?  Comment: used to [employer]   ?Tobacco Use  ? Smoking status: Former  ?  Packs/day: 1.00  ?  Years: 10.00  ?  Pack years: 10.00  ?  Types: Cigarettes  ?  Start date: 03/17/1965  ?  Quit date: 11/27/1975  ?  Years since quitting: 45.6  ? Smokeless tobacco: Never  ? Tobacco comments:  ?  quit 40 years  ?Vaping Use  ? Vaping Use: Never used  ?Substance and Sexual Activity  ? Alcohol use: Yes  ?  Alcohol/week: 1.0 standard drink  ?  Types: 1 Standard drinks or equivalent per week  ?  Comment: socially - 1 x year  ? Drug use: No  ? Sexual activity: Yes  ?  Partners: Female  ?Other Topics Concern  ? Not on file  ?Social History Narrative  ? Lives by himself in [place]  ? Two grown children, daughter in [place] and son in [place]  ? He goes to [place] to visit his sisters often   ? ?Social Determinants of Health  ? ?Financial Resource Strain: Low Risk   ? Difficulty of  Paying Living Expenses: Not hard at all  ?Food Insecurity: No Food Insecurity  ? Worried About Charity fundraiser in the Last Year: Never true  ? Ran Out of Food in the Last Year: Never true  ?Transportati

## 2021-06-26 ENCOUNTER — Other Ambulatory Visit: Payer: Self-pay | Admitting: Cardiology

## 2021-06-26 ENCOUNTER — Other Ambulatory Visit (HOSPITAL_COMMUNITY): Payer: Self-pay | Admitting: Urology

## 2021-06-26 ENCOUNTER — Telehealth (HOSPITAL_COMMUNITY): Payer: Self-pay | Admitting: Urology

## 2021-06-26 ENCOUNTER — Ambulatory Visit: Payer: Medicare HMO | Admitting: Cardiology

## 2021-06-26 ENCOUNTER — Ambulatory Visit (HOSPITAL_COMMUNITY): Payer: Medicare HMO | Attending: Cardiovascular Disease

## 2021-06-26 VITALS — BP 154/78 | HR 99 | Ht 69.0 in | Wt 291.0 lb

## 2021-06-26 DIAGNOSIS — Z952 Presence of prosthetic heart valve: Secondary | ICD-10-CM

## 2021-06-26 DIAGNOSIS — D49511 Neoplasm of unspecified behavior of right kidney: Secondary | ICD-10-CM

## 2021-06-26 DIAGNOSIS — I10 Essential (primary) hypertension: Secondary | ICD-10-CM | POA: Diagnosis not present

## 2021-06-26 DIAGNOSIS — E782 Mixed hyperlipidemia: Secondary | ICD-10-CM

## 2021-06-26 DIAGNOSIS — I77810 Thoracic aortic ectasia: Secondary | ICD-10-CM

## 2021-06-26 DIAGNOSIS — I2510 Atherosclerotic heart disease of native coronary artery without angina pectoris: Secondary | ICD-10-CM

## 2021-06-26 DIAGNOSIS — I440 Atrioventricular block, first degree: Secondary | ICD-10-CM

## 2021-06-26 LAB — ECHOCARDIOGRAM COMPLETE
AR max vel: 3.09 cm2
AV Area VTI: 2.97 cm2
AV Area mean vel: 2.96 cm2
AV Mean grad: 10.3 mmHg
AV Peak grad: 19.3 mmHg
Ao pk vel: 2.2 m/s
Area-P 1/2: 7.83 cm2
Height: 69 in
S' Lateral: 3.7 cm
Weight: 4656 oz

## 2021-06-26 MED ORDER — PERFLUTREN LIPID MICROSPHERE
1.0000 mL | INTRAVENOUS | Status: AC | PRN
  Administered 2021-06-26: 1 mL via INTRAVENOUS

## 2021-06-26 NOTE — Patient Instructions (Signed)
Medication Instructions:  ?Your physician recommends that you continue on your current medications as directed. Please refer to the Current Medication list given to you today.  ?*If you need a refill on your cardiac medications before your next appointment, please call your pharmacy* ? ? ?Lab Work: ?NONE ?If you have labs (blood work) drawn today and your tests are completely normal, you will receive your results only by: ?MyChart Message (if you have MyChart) OR ?A paper copy in the mail ?If you have any lab test that is abnormal or we need to change your treatment, we will call you to review the results. ? ? ?Testing/Procedures: ?NONE ? ? ?Follow-Up: ?At [facility], you and your health needs are our priority.  As part of our continuing mission to provide you with exceptional heart care, we have created designated Provider Care Teams.  These Care Teams include your primary Cardiologist (physician) and Advanced Practice Providers (APPs -  Physician Assistants and Nurse Practitioners) who all work together to provide you with the care you need, when you need it. ? ?We recommend signing up for the patient portal called "MyChart".  Sign up information is provided on this After Visit Summary.  MyChart is used to connect with patients for Virtual Visits (Telemedicine).  Patients are able to view lab/test results, encounter notes, upcoming appointments, etc.  Non-urgent messages can be sent to your provider as well.   ?To learn more about what you can do with MyChart, go to [email or web address].   ? ?Your next appointment:   ?KEEP SCHEDULED FOLLOW-UP ? ?Important Information About Sugar ? ? ? ? ?  ?

## 2021-06-26 NOTE — Telephone Encounter (Signed)
06/26/21~S/W Kim @ ref office ref Order to be faxed, was trnsfrd to vm for Anita/MRI schdlr. LVM requesting Order to be faxed. MF ?

## 2021-08-05 ENCOUNTER — Ambulatory Visit (HOSPITAL_COMMUNITY): Payer: Medicare HMO

## 2021-08-13 DIAGNOSIS — D49511 Neoplasm of unspecified behavior of right kidney: Secondary | ICD-10-CM | POA: Diagnosis not present

## 2021-08-19 NOTE — Progress Notes (Unsigned)
Cardiology Office Note  Date:  08/20/2021   ID:  Jordan [name], DOB 04/16/1940, MRN [id number]  PCP:  Steele Sizer, MD   Chief Complaint  Patient presents with   6 month follow up     "Doing well." Medications reviewed by the patient verbally.     HPI:  [name] is a 81 year old gentleman with past medical history of Diabetes Smoked, age 14, 56 years Morbid obesity ARB/ACE causing angioedema Essential hypertension Hyperlipidemia Severe aortic valve stenosis,  Renal mass followed by urology Who presents for f/u of his aortic valve stenosis, s/p TAVR  Last seen in clinic by myself January 2023 LHC performed 02/2021 showed 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 Otherwise, there was mild-moderate, non-obstructive coronary artery disease.   s/p TAVR 05/14/21  Post operative echo showed EF 60%, normally functioning TAVR with a mean gradient of 4 mmHg and no PVL. 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 Lovena Le.   In follow-up today reports he feels well denies significant shortness of breath, no leg edema, no abdominal fullness Walks with a cane No difficulty with his medications No significant pain around his pacer site No chest pain concerning for angina  EKG personally reviewed by myself on todays visit Shows normal sinus rhythm right bundle branch block rate 98 bpm  nonspecific T wave abnormality  Echo 03/2020, The aortic valve has an indeterminant number of cusps. There is  moderate calcification of the aortic valve. There is severe thickening of  the aortic valve. Aortic valve regurgitation is not visualized. Severe  aortic valve stenosis. Aortic valve mean  gradient measures 47.0 mmHg.    Lab work reviewed with him in detail Total  chol 124 LDL 60 HBA1C 7.7   PMH:   has a past medical history of Aortic stenosis, Chronic kidney disease, stage II (mild), Decreased libido, Diabetes mellitus without complication ([place]), Glaucoma, Gout (2009), Heart murmur, Hernia (2536,6440), Hyperlipidemia, Hypertension, Inguinal hernia without mention of obstruction or gangrene, unilateral or unspecified, (not specified as recurrent), Lumbago, Obesity, S/P TAVR (transcatheter aortic valve replacement) (05/14/2021), and Unspecified constipation.  PSH:    Past Surgical History:  Procedure Laterality Date   cataract surgery     COLONOSCOPY  03/17/2010   COLONOSCOPY N/A 04/22/2019   Procedure: COLONOSCOPY;  Surgeon: Mansouraty, Telford Nab., MD;  Location: [facility];  Service: Gastroenterology;  Laterality: N/A;   EYE SURGERY  03/17/2009   cataract   HERNIA REPAIR     INTRAOPERATIVE TRANSTHORACIC ECHOCARDIOGRAM N/A 05/14/2021   Procedure: INTRAOPERATIVE TRANSTHORACIC ECHOCARDIOGRAM;  Surgeon: Sherren Mocha, MD;  Location: [place] [facility];  Service: Open Heart Surgery;  Laterality: N/A;   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   Procedure: PACEMAKER IMPLANT;  Surgeon: Evans Lance, MD;  Location: [facility];  Service: Cardiovascular;  Laterality: N/A;   RIGHT HEART CATH AND CORONARY ANGIOGRAPHY Bilateral 02/15/2021   Procedure: RIGHT HEART CATH AND CORONARY ANGIOGRAPHY;  Surgeon: Nelva Bush, MD;  Location: [facility] [place] [facility];  Service: Cardiovascular;  Laterality: Bilateral;   TRANSCATHETER AORTIC VALVE REPLACEMENT, TRANSFEMORAL N/A 05/14/2021   Procedure: Transcatheter Aortic Valve Replacement, Transfemoral;  Surgeon: Sherren Mocha, MD;  Location: [facility];  Service: Open Heart Surgery;  Laterality: N/A;   UMBILICAL HERNIA REPAIR  03/17/2009  Current Outpatient Medications  Medication Sig Dispense Refill   ACCU-CHEK AVIVA PLUS test strip TEST TWO TIMES DAILY AS NEEDED AS DIRECTED 200 strip 2   Accu-Chek  FastClix Lancets MISC 1 each by Subdermal route 2 (two) times daily. 102 each 2   acetaminophen (TYLENOL) 325 MG tablet Take 2 tablets (650 mg total) by mouth every 4 (four) hours as needed for headache or mild pain.     Alcohol Swabs (B-D SINGLE USE SWABS REGULAR) PADS 1 each by Does not apply route as needed. 100 each 3   allopurinol (ZYLOPRIM) 300 MG tablet Take 1 tablet (300 mg total) by mouth daily. 90 tablet 1   amLODipine (NORVASC) 10 MG tablet Take 1 tablet (10 mg total) by mouth daily. 90 tablet 1   aspirin EC 81 MG tablet Take 1 tablet (81 mg total) by mouth daily. Swallow whole.     atorvastatin (LIPITOR) 40 MG tablet Take 1 tablet (40 mg total) by mouth at bedtime. 90 tablet 1   Blood Glucose Calibration (ACCU-CHEK GUIDE CONTROL) LIQD 1 each by In Vitro route as needed. (Patient taking differently: 1 each by In Vitro route as needed (blood glucose).) 1 each 3   Blood Glucose Monitoring Suppl (ACCU-CHEK AVIVA PLUS) w/Device KIT 1 each by Does not apply route 2 (two) times daily. Use as directed 100 kit 12   Cholecalciferol (VITAMIN D3 PO) Take 1 tablet by mouth every Monday, Wednesday, and Friday.     ferrous sulfate 325 (65 FE) MG tablet Take 325 mg by mouth daily.     furosemide (LASIX) 20 MG tablet Take 1 tablet by mouth once daily 90 tablet 0   glipiZIDE (GLUCOTROL XL) 5 MG 24 hr tablet Take 1 tablet (5 mg total) by mouth daily with breakfast. 90 tablet 1   latanoprost (XALATAN) 0.005 % ophthalmic solution Place 1 drop into the left eye at bedtime.      metFORMIN (GLUCOPHAGE-XR) 750 MG 24 hr tablet Take 2 tablets (1,500 mg total) by mouth daily with breakfast. 180 tablet 1   Omega-3 Fatty Acids (FISH OIL PO) Take 1 capsule by mouth 2 (two) times daily.      polyethylene glycol (MIRALAX / GLYCOLAX) packet Take 17 g by mouth at bedtime.      tadalafil (CIALIS) 10 MG tablet Take 1-2 tablets (10-20 mg total) by mouth every three (3) days as needed for erectile dysfunction. 30 tablet 0    tamsulosin (FLOMAX) 0.4 MG CAPS capsule Take 1 capsule (0.4 mg total) by mouth daily. 90 capsule 1   amoxicillin (AMOXIL) 500 MG tablet Take 4 tablets (2,000 mg total) by mouth as directed. 1 hour prior to dental work including cleanings (Patient not taking: Reported on 08/20/2021) 12 tablet 12   No current facility-administered medications for this visit.    Allergies:   Ace inhibitors   Social History:  The patient  reports that he quit smoking about 45 years ago. His smoking use included cigarettes. He started smoking about 56 years ago. He has a 10.00 pack-year smoking history. He has never used smokeless tobacco. He reports current alcohol use of about 1.0 standard drink per week. He reports that he does not use drugs.   Family History:   family history includes Diabetes in his brother, sister, and sister; Heart disease in his father; Hypertension in his father; Prostate cancer in his brother.    Review of Systems: Review of Systems  Constitutional: Negative.   HENT: Negative.  Respiratory: Negative.    Cardiovascular: Negative.   Gastrointestinal: Negative.   Musculoskeletal: Negative.        Leg weakness  Neurological: Negative.   Psychiatric/Behavioral: Negative.    All other systems reviewed and are negative.  PHYSICAL EXAM: VS:  BP 130/60 (BP Location: Left Arm, Patient Position: Sitting, Cuff Size: Large)   Pulse 98   Ht '5\' 9"'  (1.753 m)   Wt 296 lb 4 oz (134.4 kg)   SpO2 98%   BMI 43.75 kg/m  , BMI Body mass index is 43.75 kg/m. Constitutional:  oriented to person, place, and time. No distress.  HENT:  Head: Grossly normal Eyes:  no discharge. No scleral icterus.  Neck: No JVD, no carotid bruits  Cardiovascular: Regular rate and rhythm, no murmurs appreciated Pulmonary/Chest: Clear to auscultation bilaterally, no wheezes or rails Abdominal: Soft.  no distension.  no tenderness.  Musculoskeletal: Normal range of motion Neurological:  normal muscle tone.  Coordination normal. No atrophy Skin: Skin warm and dry Psychiatric: normal affect, pleasant  Recent Labs: 05/10/2021: ALT 16; B Natriuretic Peptide 47.1 05/18/2021: Magnesium 1.7 05/20/2021: BUN 19; Creatinine, Ser 1.02; Hemoglobin 11.6; Platelets 124; Potassium 4.2; Sodium 136    Lipid Panel Lab Results  Component Value Date   CHOL 122 01/16/2021   HDL 49 01/16/2021   LDLCALC 58 01/16/2021   TRIG 73 01/16/2021      Wt Readings from Last 3 Encounters:  08/20/21 296 lb 4 oz (134.4 kg)  06/26/21 291 lb (132 kg)  05/31/21 288 lb 3.2 oz (130.7 kg)     ASSESSMENT AND PLAN:  Aortic valve stenosis, severe Moderate aortic valve stenosis June 2020 Severe stenosis January 2022 Recent cardiac catheterization, nonobstructive disease Underwent TAVR placement February 2023 Recent echo reviewed from April 2023 LVEF at 55 to 60%. There is a 29 mm Sapien prosthetic (TAVR) valve present in the aortic position with a mean gradient measures 10.2 mmHg, peak 19.72mHg, and AVA 2.97cm2 Feels well, clinically doing well Has repeat echocardiogram scheduled 2024  Essential hypertension - Plan: EKG 12-Lead Blood pressure is well controlled on today's visit. No changes made to the medications.  Mixed hyperlipidemia Continue Lipitor 40 daily  goal LDL less than 70  Controlled type 2 diabetes mellitus with microalbuminuria, without long-term current use of insulin (HCC) A1c typically 6-7, Sedentary Dietary changes and walking program discussed  Obesity, Class III, BMI 40-49.9 (morbid obesity) ([place] Recommend he continue his walking 30 minutes daily   Total encounter time more than 30 minutes  Greater than 50% was spent in counseling and coordination of care with the patient    Orders Placed This Encounter  Procedures   EKG 12-Lead     Signed, TEsmond Plants M.D., Ph.D. 08/20/2021  [place]

## 2021-08-20 ENCOUNTER — Other Ambulatory Visit: Payer: Self-pay | Admitting: Internal Medicine

## 2021-08-20 ENCOUNTER — Other Ambulatory Visit (HOSPITAL_COMMUNITY): Payer: Medicare HMO

## 2021-08-20 ENCOUNTER — Encounter: Payer: Self-pay | Admitting: Cardiovascular Disease

## 2021-08-20 ENCOUNTER — Ambulatory Visit: Payer: Medicare HMO | Admitting: Cardiovascular Disease

## 2021-08-20 VITALS — BP 130/60 | HR 98 | Ht 69.0 in | Wt 296.2 lb

## 2021-08-20 DIAGNOSIS — I442 Atrioventricular block, complete: Secondary | ICD-10-CM

## 2021-08-20 DIAGNOSIS — I739 Peripheral vascular disease, unspecified: Secondary | ICD-10-CM

## 2021-08-20 DIAGNOSIS — I77810 Thoracic aortic ectasia: Secondary | ICD-10-CM | POA: Diagnosis not present

## 2021-08-20 DIAGNOSIS — Z950 Presence of cardiac pacemaker: Secondary | ICD-10-CM | POA: Diagnosis not present

## 2021-08-20 DIAGNOSIS — Z952 Presence of prosthetic heart valve: Secondary | ICD-10-CM

## 2021-08-20 DIAGNOSIS — I700 Atherosclerosis of aorta: Secondary | ICD-10-CM

## 2021-08-20 DIAGNOSIS — I350 Nonrheumatic aortic (valve) stenosis: Secondary | ICD-10-CM | POA: Diagnosis not present

## 2021-08-20 MED ORDER — FUROSEMIDE 20 MG PO TABS: 20.0000 mg | ORAL_TABLET | Freq: Every day | ORAL | 3 refills | Status: DC

## 2021-08-20 NOTE — Patient Instructions (Addendum)
Medication Instructions:  No changes  If you need a refill on your cardiac medications before your next appointment, please call your pharmacy.   Lab work: No new labs needed  Testing/Procedures: No new testing needed  Follow-Up: At [facility], you and your health needs are our priority.  As part of our continuing mission to provide you with exceptional heart care, we have created designated Provider Care Teams.  These Care Teams include your primary Cardiologist (physician) and Advanced Practice Providers (APPs -  Physician Assistants and Nurse Practitioners) who all work together to provide you with the care you need, when you need it.  You will need a follow up appointment in 9 months  Providers on your designated Care Team:   Murray Hodgkins, NP Christell Faith, PA-C Cadence Kathlen Mody, [place]  COVID-19 Vaccine Information can be found at: [email or web address] For questions related to vaccine distribution or appointments, please email [email or web address] or call [phone].

## 2021-08-21 ENCOUNTER — Ambulatory Visit (INDEPENDENT_AMBULATORY_CARE_PROVIDER_SITE_OTHER): Payer: Medicare HMO

## 2021-08-21 DIAGNOSIS — I442 Atrioventricular block, complete: Secondary | ICD-10-CM | POA: Diagnosis not present

## 2021-08-23 LAB — CUP PACEART REMOTE DEVICE CHECK
Battery Remaining Longevity: 179 mo
Battery Voltage: 3.2 V
Brady Statistic AP VP Percent: 2.14 %
Brady Statistic AP VS Percent: 0.48 %
Brady Statistic AS VP Percent: 0.29 %
Brady Statistic AS VS Percent: 97.1 %
Brady Statistic RA Percent Paced: 2.6 %
Brady Statistic RV Percent Paced: 2.43 %
Date Time Interrogation Session: 20230607053754
Implantable Lead Implant Date: 20230307
Implantable Lead Implant Date: 20230307
Implantable Lead Location: 753859
Implantable Lead Location: 753860
Implantable Lead Model: 3830
Implantable Lead Model: 5076
Implantable Pulse Generator Implant Date: 20230307
Lead Channel Impedance Value: 342 Ohm
Lead Channel Impedance Value: 418 Ohm
Lead Channel Impedance Value: 437 Ohm
Lead Channel Impedance Value: 589 Ohm
Lead Channel Pacing Threshold Amplitude: 0.75 V
Lead Channel Pacing Threshold Amplitude: 0.875 V
Lead Channel Pacing Threshold Pulse Width: 0.4 ms
Lead Channel Pacing Threshold Pulse Width: 0.4 ms
Lead Channel Sensing Intrinsic Amplitude: 20.125 mV
Lead Channel Sensing Intrinsic Amplitude: 20.125 mV
Lead Channel Sensing Intrinsic Amplitude: 4.625 mV
Lead Channel Sensing Intrinsic Amplitude: 4.625 mV
Lead Channel Setting Pacing Amplitude: 1.5 V
Lead Channel Setting Pacing Amplitude: 2 V
Lead Channel Setting Pacing Pulse Width: 0.4 ms
Lead Channel Setting Sensing Sensitivity: 1.2 mV

## 2021-08-27 ENCOUNTER — Encounter: Payer: Self-pay | Admitting: Family Medicine

## 2021-08-27 ENCOUNTER — Ambulatory Visit (INDEPENDENT_AMBULATORY_CARE_PROVIDER_SITE_OTHER): Payer: Medicare HMO | Admitting: Family Medicine

## 2021-08-27 ENCOUNTER — Other Ambulatory Visit: Payer: Self-pay

## 2021-08-27 VITALS — BP 126/70 | HR 90 | Resp 16 | Ht 69.0 in | Wt 296.0 lb

## 2021-08-27 DIAGNOSIS — I152 Hypertension secondary to endocrine disorders: Secondary | ICD-10-CM

## 2021-08-27 DIAGNOSIS — E1140 Type 2 diabetes mellitus with diabetic neuropathy, unspecified: Secondary | ICD-10-CM

## 2021-08-27 DIAGNOSIS — Z950 Presence of cardiac pacemaker: Secondary | ICD-10-CM

## 2021-08-27 DIAGNOSIS — M170 Bilateral primary osteoarthritis of knee: Secondary | ICD-10-CM

## 2021-08-27 DIAGNOSIS — D696 Thrombocytopenia, unspecified: Secondary | ICD-10-CM

## 2021-08-27 DIAGNOSIS — I739 Peripheral vascular disease, unspecified: Secondary | ICD-10-CM | POA: Diagnosis not present

## 2021-08-27 DIAGNOSIS — I700 Atherosclerosis of aorta: Secondary | ICD-10-CM

## 2021-08-27 DIAGNOSIS — E1159 Type 2 diabetes mellitus with other circulatory complications: Secondary | ICD-10-CM | POA: Diagnosis not present

## 2021-08-27 DIAGNOSIS — N521 Erectile dysfunction due to diseases classified elsewhere: Secondary | ICD-10-CM

## 2021-08-27 DIAGNOSIS — Z952 Presence of prosthetic heart valve: Secondary | ICD-10-CM

## 2021-08-27 DIAGNOSIS — M109 Gout, unspecified: Secondary | ICD-10-CM

## 2021-08-27 DIAGNOSIS — I77810 Thoracic aortic ectasia: Secondary | ICD-10-CM | POA: Diagnosis not present

## 2021-08-27 DIAGNOSIS — J410 Simple chronic bronchitis: Secondary | ICD-10-CM

## 2021-08-27 LAB — POCT GLYCOSYLATED HEMOGLOBIN (HGB A1C): Hemoglobin A1C: 7.5 % — AB (ref 4.0–5.6)

## 2021-08-27 MED ORDER — ALLOPURINOL 300 MG PO TABS: 300.0000 mg | ORAL_TABLET | Freq: Every day | ORAL | 1 refills | Status: DC

## 2021-08-27 NOTE — Progress Notes (Signed)
Name: Jordan [name]   MRN: [id number]    DOB: March 28, 1940   Date:08/27/2021       Progress Note  Subjective  Chief Complaint  Follow Up  HPI  DM with microalbuminuria , obesity, dyslipidemia and HTN and  ED: he is back on Glipizide 61m ER and still on Metformin one daily  HgbA1C used to be above 9% but he has changed his diet, walking 5 days a week for 30 minutes ( with breaks every 10 minutes)  [facility] .Marland KitchenHis A1C was 7.9 % in March and today is 7.5 %   Cialis  is working well and uses prn for ED. He cannot take ACE because of history of angioedema, bp is controlled with Norvasc.  He denies polyphagia, polydipsia but has polyuria - also has BPH and has nocturia, denies recent episodes of hypoglycemia. Goal A1C is below 8 % for him, continue current regiment    Chronic bronchitis: he used to smoke but quit many years ago, he still has a cough that is productive at times, coughs in the mornings and has a pale yellow sputum, he denies  wheezing  He has some sob with activity and he is hopeful it will improved with Aortic valve replacement .    BPH: seen by [place]ilbertonand symptoms are stable, on Flomax but still has nocturia about 2-3 times per night    HTN:  he is compliant with mediations, bp is at goal, no chest pain , he denies orthostatic changes or dizzines , he has some SOB with activity .  He has lower extremity edema but mild    Hyperlipidemia: taking lipitor and denies side effects, no myalgia. Reviewed last last LDL and at goal    Morbid obese : BMI above 40, he is trying to eat healthy - eating mostly chicken, using a hiking stick and walks 30 minutes about 5 days a week, weight is stable.    Aortic Valve stenosis: under the care of cardiologist - Dr. GRockey Situ   no syncope or chest pain, had echo normal EF. 50-55 %, Echo showed ascending aorta dilation. He is now also [place] had [place]AVR 05/14/2021 - doing well  since.   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 TLovena Le    Atherosclerosis of aorta: he is on statin therapy, taking aspirin 81 mg daily and no side effects .Reviewed last labs LDL down to 58 . Continue Atorvastatin    Iron deficiency anemia:  secondary to rectal bleeding, last HCT dropped again but he has not noticed any bleeding, he had hemorrhoids and diverticulosis, he was released from Dr. WAllen Norris. Last HCT was slightly lower at 35.5 after admission for pacemaker 05/2021   Gout: taking allopurinol and not on diuretics  Last uric acid was normal . No recent flares   Claudication: going on for months, used to walk for one hour but now takes a break after 15-20 minutes he needs to stop to rest because legs feels heavy and aching but able to go on for another 10 minutes afterwards. . He was seen  by vascular surgeon and felt likely secondary from lumbar spine problems. Discussed risk and benefits of therapy for lumbar spine stenosis and since symptoms only when walking he will continue physical activity and stop to take breaks as needed . 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   PAD (peripheral artery disease) ([place]) 01/17/2020   Diverticulosis of colon with hemorrhage 05/26/2019   History of anemia 05/26/2019   Atherosclerosis of aorta ([place]) 04/26/2019   Rectal bleed 04/22/2019   Chronic bronchitis ([place]) 07/30/2018   Bilateral hydrocele 11/27/2015   Ventral hernia without obstruction or gangrene 11/27/2015   Controlled type 2 diabetes mellitus with microalbuminuria ([place]) 10/26/2015   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 ([place]) 11/07/2014   Hyperlipidemia 11/07/2014   Essential hypertension 11/07/2014   Obesity, Class III, BMI 40-49.9 (morbid obesity) ([place]) 11/07/2014   Gout 11/07/2014   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   Epistaxis 09/21/2014   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   Severe aortic valve stenosis 12/02/2012   Constipation 10/26/2012   Incisional hernia, without obstruction or gangrene 10/26/2012   Left inguinal hernia     Past Surgical History:  Procedure Laterality Date   cataract surgery     COLONOSCOPY  03/17/2010   COLONOSCOPY N/A 04/22/2019   Procedure: COLONOSCOPY;  Surgeon: Irving Copas., MD;  Location: [facility];  Service: Gastroenterology;  Laterality: N/A;   EYE SURGERY  03/17/2009   cataract   HERNIA REPAIR     INTRAOPERATIVE TRANSTHORACIC ECHOCARDIOGRAM N/A 05/14/2021   Procedure: INTRAOPERATIVE TRANSTHORACIC ECHOCARDIOGRAM;  Surgeon: Sherren Mocha, MD;  Location: [place] [facility];  Service: Open Heart Surgery;  Laterality: N/A;   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   Procedure: PACEMAKER IMPLANT;  Surgeon: Evans Lance, MD;  Location: [place] [facility];  Service: Cardiovascular;  Laterality: N/A;   RIGHT HEART CATH AND CORONARY ANGIOGRAPHY Bilateral 02/15/2021   Procedure: RIGHT HEART CATH AND CORONARY ANGIOGRAPHY;  Surgeon: Nelva Bush, MD;  Location: [place] [facility];  Service: Cardiovascular;  Laterality: Bilateral;    TRANSCATHETER AORTIC VALVE REPLACEMENT, TRANSFEMORAL N/A 05/14/2021   Procedure: Transcatheter Aortic Valve Replacement, Transfemoral;  Surgeon: Sherren Mocha, MD;  Location: [place] [facility];  Service: Open Heart Surgery;  Laterality: N/A;   UMBILICAL HERNIA REPAIR  03/17/2009    Family History  Problem Relation Age of Onset   Heart disease Father    Hypertension Father    Diabetes Sister    Diabetes Brother    Prostate cancer Brother    Diabetes Sister    Kidney disease Neg Hx    Kidney cancer Neg Hx    Bladder Cancer Neg Hx    Colon cancer Neg Hx    Esophageal cancer Neg Hx    Inflammatory bowel disease Neg Hx    Liver disease Neg Hx    Pancreatic cancer Neg Hx    Rectal cancer Neg Hx    Stomach  cancer Neg Hx     Social History   Tobacco Use   Smoking status: Former    Packs/day: 1.00    Years: 10.00    Total pack years: 10.00    Types: Cigarettes    Start date: 03/17/1965    Quit date: 11/27/1975    Years since quitting: 45.7   Smokeless tobacco: Never   Tobacco comments:    quit 40 years  Substance Use Topics   Alcohol use: Yes    Alcohol/week: 1.0 standard drink of alcohol    Types: 1 Standard drinks or equivalent per week    Comment: socially - 1 x year     Current Outpatient Medications:    ACCU-CHEK AVIVA PLUS test strip, TEST TWO TIMES DAILY AS NEEDED AS DIRECTED, Disp: 200 strip, Rfl: 2   Accu-Chek FastClix Lancets MISC, 1 each by Subdermal route 2 (two) times daily., Disp: 102 each, Rfl: 2   acetaminophen (TYLENOL) 325 MG tablet, Take 2 tablets (650 mg total) by mouth every 4 (four) hours as needed for headache or mild pain., Disp: , Rfl:    Alcohol Swabs (B-D SINGLE USE SWABS REGULAR) PADS, 1 each by Does not apply route as needed., Disp: 100 each, Rfl: 3   allopurinol (ZYLOPRIM) 300 MG tablet, Take 1 tablet (300 mg total) by mouth daily., Disp: 90 tablet, Rfl: 1   amLODipine (NORVASC) 10 MG tablet, Take 1 tablet (10 mg total) by mouth daily., Disp:  90 tablet, Rfl: 1   aspirin EC 81 MG tablet, Take 1 tablet (81 mg total) by mouth daily. Swallow whole., Disp: , Rfl:    atorvastatin (LIPITOR) 40 MG tablet, Take 1 tablet (40 mg total) by mouth at bedtime., Disp: 90 tablet, Rfl: 1   Blood Glucose Calibration (ACCU-CHEK GUIDE CONTROL) LIQD, 1 each by In Vitro route as needed. (Patient taking differently: 1 each by In Vitro route as needed (blood glucose).), Disp: 1 each, Rfl: 3   Blood Glucose Monitoring Suppl (ACCU-CHEK AVIVA PLUS) w/Device KIT, 1 each by Does not apply route 2 (two) times daily. Use as directed, Disp: 100 kit, Rfl: 12   Cholecalciferol (VITAMIN D3 PO), Take 1 tablet by mouth every Monday, Wednesday, and Friday., Disp: , Rfl:    ferrous sulfate 325 (65 FE) MG tablet, Take 325 mg by mouth daily., Disp: , Rfl:    furosemide (LASIX) 20 MG tablet, Take 1 tablet (20 mg total) by mouth daily., Disp: 90 tablet, Rfl: 3   glipiZIDE (GLUCOTROL XL) 5 MG 24 hr tablet, Take 1 tablet (5 mg total) by mouth daily with breakfast., Disp: 90 tablet, Rfl: 1   latanoprost (XALATAN) 0.005 % ophthalmic solution, Place 1 drop into the left eye at bedtime. , Disp: , Rfl:    metFORMIN (GLUCOPHAGE-XR) 750 MG 24 hr tablet, Take 2 tablets (1,500 mg total) by mouth daily with breakfast., Disp: 180 tablet, Rfl: 1   Omega-3 Fatty Acids (FISH OIL PO), Take 1 capsule by mouth 2 (two) times daily. , Disp: , Rfl:    polyethylene glycol (MIRALAX / GLYCOLAX) packet, Take 17 g by mouth at bedtime. , Disp: , Rfl:    tadalafil (CIALIS) 10 MG tablet, Take 1-2 tablets (10-20 mg total) by mouth every three (3) days as needed for erectile dysfunction., Disp: 30 tablet, Rfl: 0   tamsulosin (FLOMAX) 0.4 MG CAPS capsule, Take 1 capsule (0.4 mg total) by mouth daily., Disp: 90 capsule, Rfl: 1  Allergies  Allergen Reactions   Ace  Inhibitors Other (See Comments)    angioedema    I personally reviewed active problem list, medication list, allergies, family history, social  history, health maintenance with the patient/caregiver today.   ROS  Constitutional: Negative for fever or weight change.  Respiratory: positive for cough but no  shortness of breath.   Cardiovascular: Negative for chest pain or palpitations.  Gastrointestinal: Negative for abdominal pain, no bowel changes.  Musculoskeletal: positive  for gait problem and intermittent knee joint swelling.  Skin: Negative for rash.  Neurological: Negative for dizziness or headache.  No other specific complaints in a complete review of systems (except as listed in HPI above).   Objective  Vitals:   08/27/21 1345  BP: 126/70  Pulse: 90  Resp: 16  SpO2: 97%  Weight: 296 lb (134.3 kg)  Height: '5\' 9"'  (1.753 m)    Body mass index is 43.71 kg/m.  Physical Exam  Constitutional: Patient appears well-developed and well-nourished. Obese  No distress.  HEENT: head atraumatic, normocephalic, pupils equal and reactive to light, neck supple Cardiovascular: Normal rate, regular rhythm and normal heart sounds.  No murmur heard. Trace  BLE edema. Pulmonary/Chest: Effort normal and breath sounds normal. No respiratory distress. Abdominal: Soft.  There is no tenderness. Psychiatric: Patient has a normal mood and affect. behavior is normal. Judgment and thought content normal.   Recent Results (from the past 2160 hour(s))  CUP PACEART INCLINIC DEVICE CHECK     Status: None   Collection Time: 05/31/21  1:32 PM  Result Value Ref Range   Date Time Interrogation Session [id number]    Pulse Generator Manufacturer MERM    Pulse Gen Model [id number] Azure XT DR MRI    Pulse Gen Serial Number [id number]    Clinic [facility]    Implantable Pulse Generator Type Implantable Pulse Generator    Implantable Pulse Generator Implant Date [id number]    Implantable Lead Manufacturer MERM    Implantable Lead Model 3830 SelectSecure MRI SureScan    Implantable Lead Serial Number [id number]    Implantable Lead  Implant Date 40102725    Implantable Lead Location Detail 1 UNKNOWN    Implantable Lead Special Function LBBB    Implantable Lead Location [id number]    Implantable Lead Manufacturer MERM    Implantable Lead Model 5076 CapSureFix Novus MRI SureScan    Implantable Lead Serial Number [id number]    Implantable Lead Implant Date [id number]    Implantable Lead Location Detail 1 APPENDAGE    Implantable Lead Location [id number]    Lead Channel Setting Sensing Sensitivity 1.2 mV   Lead Channel Setting Pacing Amplitude 3.5 V   Lead Channel Setting Pacing Pulse Width 0.4 ms   Lead Channel Setting Pacing Amplitude 3.5 V   Lead Channel Impedance Value 646 ohm   Lead Channel Impedance Value 475 ohm   Lead Channel Sensing Intrinsic Amplitude 7.25 mV   Lead Channel Sensing Intrinsic Amplitude 4.625 mV   Lead Channel Pacing Threshold Amplitude 0.875 V   Lead Channel Pacing Threshold Pulse Width 0.4 ms   Lead Channel Impedance Value 646 ohm   Lead Channel Impedance Value 418 ohm   Lead Channel Sensing Intrinsic Amplitude 23.625 mV   Lead Channel Sensing Intrinsic Amplitude 24.25 mV   Lead Channel Pacing Threshold Amplitude 0.5 V   Lead Channel Pacing Threshold Pulse Width 0.4 ms   Battery Status OK    Battery Remaining Longevity 147 mo   Battery Voltage 3.21 V   Brady Statistic RA  Percent Paced 3.01 %   Brady Statistic RV Percent Paced 94.75 %   Brady Statistic AP VP Percent 2.98 %   Brady Statistic AS VP Percent 91.77 %   Brady Statistic AP VS Percent 0.01 %   Brady Statistic AS VS Percent 5.24 %   Eval Rhythm AS/VS 94   ECHOCARDIOGRAM COMPLETE     Status: None   Collection Time: 06/26/21  3:25 PM  Result Value Ref Range   Weight 4,656 oz   Height 69 in   BP 154/78 mmHg   Area-P 1/2 7.83 cm2   S' Lateral 3.70 cm   AV Area mean vel 2.96 cm2   AR max vel 3.09 cm2   AV Area VTI 2.97 cm2   Ao pk vel 2.20 m/s   AV Mean grad 10.3 mmHg   AV Peak grad 19.3 mmHg  CUP PACEART REMOTE DEVICE CHECK      Status: None   Collection Time: 08/21/21  5:37 AM  Result Value Ref Range   Date Time Interrogation Session [id number]    Pulse Generator Manufacturer MERM    Pulse Gen Model [id number] Azure XT DR MRI    Pulse Gen Serial Number [id number]    Clinic [facility]    Implantable Pulse Generator Type Implantable Pulse Generator    Implantable Pulse Generator Implant Date 19147829    Implantable Lead Manufacturer MERM    Implantable Lead Model 3830 SelectSecure MRI SureScan    Implantable Lead Serial Number [id number]    Implantable Lead Implant Date [id number]    Implantable Lead Location Detail 1 UNKNOWN    Implantable Lead Special Function LBBB    Implantable Lead Location [id number]    Implantable Lead Manufacturer MERM    Implantable Lead Model 5076 CapSureFix Novus MRI SureScan    Implantable Lead Serial Number [id number]    Implantable Lead Implant Date [id number]    Implantable Lead Location Detail 1 APPENDAGE    Implantable Lead Location [id number]    Lead Channel Setting Sensing Sensitivity 1.2 mV   Lead Channel Setting Pacing Amplitude 1.5 V   Lead Channel Setting Pacing Pulse Width 0.4 ms   Lead Channel Setting Pacing Amplitude 2 V   Lead Channel Impedance Value 437 ohm   Lead Channel Impedance Value 342 ohm   Lead Channel Sensing Intrinsic Amplitude 4.625 mV   Lead Channel Sensing Intrinsic Amplitude 4.625 mV   Lead Channel Pacing Threshold Amplitude 0.75 V   Lead Channel Pacing Threshold Pulse Width 0.4 ms   Lead Channel Impedance Value 589 ohm   Lead Channel Impedance Value 418 ohm   Lead Channel Sensing Intrinsic Amplitude 20.125 mV   Lead Channel Sensing Intrinsic Amplitude 20.125 mV   Lead Channel Pacing Threshold Amplitude 0.875 V   Lead Channel Pacing Threshold Pulse Width 0.4 ms   Battery Status OK    Battery Remaining Longevity 179 mo   Battery Voltage 3.20 V   Brady Statistic RA Percent Paced 2.6 %   Brady Statistic RV Percent Paced 2.43 %   Brady Statistic  AP VP Percent 2.14 %   Brady Statistic AS VP Percent 0.29 %   Brady Statistic AP VS Percent 0.48 %   Brady Statistic AS VS Percent 97.1 %     PHQ2/9:    08/27/2021    1:44 PM 04/17/2021   11:02 AM 02/05/2021   10:08 AM 01/16/2021   10:44 AM 08/15/2020    2:42 PM  Depression screen PHQ 2/9  Decreased  Interest 0 0 0 0 2  Down, Depressed, Hopeless 0 0 0 0 1  PHQ - 2 Score 0 0 0 0 3  Altered sleeping 0 0 0 0 1  Tired, decreased energy 0 0 3 3 0  Change in appetite 0 0 0 0 0  Feeling bad or failure about yourself  0 0 0 0 0  Trouble concentrating 0 0 0 0 0  Moving slowly or fidgety/restless 0 0 0 0 0  Suicidal thoughts 0 0 0 0 0  PHQ-9 Score 0 0 '3 3 4    ' phq 9 is  negative  Fall Risk:    08/27/2021    1:44 PM 04/17/2021   11:02 AM 02/05/2021   10:10 AM 01/16/2021   10:44 AM 08/15/2020    2:41 PM  Fall Risk   Falls in the past year? 0 0 0 0 0  Number falls in past yr: 0 0 0 0 0  Injury with Fall? 0 0 0 0 0  Risk for fall due to : No Fall Risks;Impaired balance/gait No Fall Risks No Fall Risks No Fall Risks   Follow up Falls prevention discussed Falls prevention discussed Falls prevention discussed Falls prevention discussed       Functional Status Survey: Is the patient deaf or have difficulty hearing?: Yes Does the patient have difficulty seeing, even when wearing glasses/contacts?: No Does the patient have difficulty concentrating, remembering, or making decisions?: No Does the patient have difficulty walking or climbing stairs?: Yes Does the patient have difficulty dressing or bathing?: No Does the patient have difficulty doing errands alone such as visiting a doctor's office or shopping?: No    Assessment & Plan  1. 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 Thrombocytopenia ([place])  On last CBC done at [facility] we will recheck next visit   3. Atherosclerosis of aorta (HCC)  On statin therapy and aspirin   4. Obesity,  Class III, BMI 40-49.9 (morbid obesity) (HCC)  Stable, discussed health diet   5. Hypertension associated with diabetes ([place])   6. 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HCC)   8. Simple chronic bronchitis (HCC)  Stable.   9. S/P TAVR (transcatheter aortic valve replacement)  Doing well now   10. Controlled gout  - allopurinol (ZYLOPRIM) 300 MG tablet; Take 1 tablet (300 mg total) by mouth daily.  Dispense: 90 tablet; Refill: 1  11. Primary osteoarthritis of both knees   12. Pacemaker

## 2021-08-28 ENCOUNTER — Encounter: Payer: Self-pay | Admitting: Cardiology

## 2021-08-28 ENCOUNTER — Ambulatory Visit: Payer: Medicare HMO | Admitting: Cardiology

## 2021-08-28 VITALS — BP 128/76 | HR 85 | Ht 69.0 in | Wt 294.8 lb

## 2021-08-28 DIAGNOSIS — I442 Atrioventricular block, complete: Secondary | ICD-10-CM

## 2021-08-28 DIAGNOSIS — Z952 Presence of prosthetic heart valve: Secondary | ICD-10-CM

## 2021-08-28 DIAGNOSIS — I350 Nonrheumatic aortic (valve) stenosis: Secondary | ICD-10-CM | POA: Diagnosis not present

## 2021-08-28 LAB — PACEMAKER DEVICE OBSERVATION

## 2021-08-28 NOTE — Patient Instructions (Signed)
Medications: Your physician recommends that you continue on your current medications as directed. Please refer to the Current Medication list given to you today. *If you need a refill on your cardiac medications before your next appointment, please call your pharmacy*  Lab Work: None. If you have labs (blood work) drawn today and your tests are completely normal, you will receive your results only by: MyChart Message (if you have MyChart) OR A paper copy in the mail If you have any lab test that is abnormal or we need to change your treatment, we will call you to review the results.  Testing/Procedures: None.  Follow-Up: At [facility], you and your health needs are our priority.  As part of our continuing mission to provide you with exceptional heart care, we have created designated Provider Care Teams.  These Care Teams include your primary Cardiologist (physician) and Advanced Practice Providers (APPs -  Physician Assistants and Nurse Practitioners) who all work together to provide you with the care you need, when you need it.  Your physician wants you to follow-up in: 12 months with Cameron Lambert, MD     You will receive a reminder letter in the mail two months in advance. If you don't receive a letter, please call our office to schedule the follow-up appointment.  We recommend signing up for the patient portal called "MyChart".  Sign up information is provided on this After Visit Summary.  MyChart is used to connect with patients for Virtual Visits (Telemedicine).  Patients are able to view lab/test results, encounter notes, upcoming appointments, etc.  Non-urgent messages can be sent to your provider as well.   To learn more about what you can do with MyChart, go to [email or web address].    Any Other Special Instructions Will Be Listed Below (If Applicable).  

## 2021-08-28 NOTE — Progress Notes (Signed)
Electrophysiology Office Follow up Visit Note:    Date:  08/28/2021   ID:  Jordan [name], DOB [birth year], MRN [id number]  PCP:  Steele Sizer, MD  [facility] Cardiologist:  Ida Rogue, MD  [facility] Electrophysiologist:  Vickie Epley, MD    Interval History:    Jordan [name] is a 81 y.o. male who presents for a follow up visit.  He had a permanent pacemaker implanted by Dr. Lovena Le on May 21, 2021 after a transfemoral TAVR on May 14, 2021.  He is done well after pacemaker implant.  His AV conduction is intact today.  Incision is healed well.       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 Burt Knack and Dr. Cyndia Bent   Unspecified constipation     Past Surgical History:  Procedure Laterality Date   cataract surgery     COLONOSCOPY  03/17/2010   COLONOSCOPY N/A 04/22/2019   Procedure: COLONOSCOPY;  Surgeon: Irving Copas., MD;  Location: [facility];  Service: Gastroenterology;  Laterality: N/A;   EYE SURGERY  03/17/2009   cataract   HERNIA REPAIR     INTRAOPERATIVE TRANSTHORACIC ECHOCARDIOGRAM N/A 05/14/2021   Procedure: INTRAOPERATIVE TRANSTHORACIC ECHOCARDIOGRAM;  Surgeon: Sherren Mocha, MD;  Location: [place] [facility];  Service: Open Heart Surgery;  Laterality: N/A;   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   Procedure: PACEMAKER IMPLANT;  Surgeon: Evans Lance, MD;  Location: [facility];  Service: Cardiovascular;  Laterality: N/A;   RIGHT HEART  CATH AND CORONARY ANGIOGRAPHY Bilateral 02/15/2021   Procedure: RIGHT HEART CATH AND CORONARY ANGIOGRAPHY;  Surgeon: Nelva Bush, MD;  Location: [place] [facility];  Service: Cardiovascular;  Laterality: Bilateral;   TRANSCATHETER AORTIC VALVE REPLACEMENT, TRANSFEMORAL N/A 05/14/2021   Procedure: Transcatheter Aortic Valve Replacement, Transfemoral;  Surgeon: Sherren Mocha, MD;  Location: [place] [facility];  Service: Open Heart Surgery;  Laterality: N/A;   UMBILICAL HERNIA REPAIR  03/17/2009    Current Medications: Current Meds  Medication Sig   ACCU-CHEK AVIVA PLUS test strip TEST TWO TIMES DAILY AS NEEDED AS DIRECTED   Accu-Chek FastClix Lancets MISC 1 each by Subdermal route 2 (two) times daily.   acetaminophen (TYLENOL) 325 MG tablet Take 2 tablets (650 mg total) by mouth every 4 (four) hours as needed for headache or mild pain.   Alcohol Swabs (B-D SINGLE USE SWABS REGULAR) PADS 1 each by Does not apply route as needed.   allopurinol (ZYLOPRIM) 300 MG tablet Take 1 tablet (300 mg total) by mouth daily.   amLODipine (NORVASC) 10 MG tablet Take 1 tablet (10 mg total) by mouth daily.   aspirin EC 81 MG tablet Take 1 tablet (81 mg total) by mouth daily. Swallow whole.   atorvastatin (LIPITOR) 40 MG tablet Take 1 tablet (40 mg total) by mouth at bedtime.   Blood Glucose Calibration (ACCU-CHEK GUIDE CONTROL) LIQD 1 each  by In Vitro route as needed.   Blood Glucose Monitoring Suppl (ACCU-CHEK AVIVA PLUS) w/Device KIT 1 each by Does not apply route 2 (two) times daily. Use as directed   Cholecalciferol (VITAMIN D3 PO) Take 1 tablet by mouth every Monday, Wednesday, and Friday.   ferrous sulfate 325 (65 FE) MG tablet Take 325 mg by mouth daily.   furosemide (LASIX) 20 MG tablet Take 1 tablet (20 mg total) by mouth daily.   glipiZIDE (GLUCOTROL XL) 5 MG 24 hr tablet Take 1 tablet (5 mg total) by mouth daily with breakfast.   latanoprost (XALATAN) 0.005 % ophthalmic solution Place 1 drop  into the left eye at bedtime.    metFORMIN (GLUCOPHAGE-XR) 750 MG 24 hr tablet Take 2 tablets (1,500 mg total) by mouth daily with breakfast.   Omega-3 Fatty Acids (FISH OIL PO) Take 1 capsule by mouth 2 (two) times daily.    polyethylene glycol (MIRALAX / GLYCOLAX) packet Take 17 g by mouth at bedtime.    tadalafil (CIALIS) 10 MG tablet Take 1-2 tablets (10-20 mg total) by mouth every three (3) days as needed for erectile dysfunction.   tamsulosin (FLOMAX) 0.4 MG CAPS capsule Take 1 capsule (0.4 mg total) by mouth daily.     Allergies:   Ace inhibitors   Social History   Socioeconomic History   Marital status: Widowed    Spouse name: Not on file   Number of children: 2   Years of education: Not on file   Highest education level: Some college, no degree  Occupational History   Occupation: retired     Comment: used to [employer]   Tobacco Use   Smoking status: Former    Packs/day: 1.00    Years: 10.00    Total pack years: 10.00    Types: Cigarettes    Start date: 03/17/1965    Quit date: 11/27/1975    Years since quitting: 45.7   Smokeless tobacco: Never   Tobacco comments:    quit 40 years  Vaping Use   Vaping Use: Never used  Substance and Sexual Activity   Alcohol use: Yes    Alcohol/week: 1.0 standard drink of alcohol    Types: 1 Standard drinks or equivalent per week    Comment: socially - 1 x year   Drug use: No   Sexual activity: Yes    Partners: Female  Other Topics Concern   Not on file  Social History Narrative   Lives by himself in [place]   Two grown children, daughter in [place] and son in [place]   He goes to [place] to visit his sisters often    Social Determinants of Health   Financial Resource Strain: Low Risk  (02/05/2021)   Overall Financial Resource Strain (CARDIA)    Difficulty of Paying Living Expenses: Not hard at all  Food Insecurity: No Food Insecurity (02/05/2021)   Hunger Vital Sign    Worried About [place]  in the Last Year: Never true    [place] in the Last Year: Never true  Transportation Needs: No Transportation Needs (02/05/2021)   PRAPARE - Hydrologist (Medical): No    Lack of Transportation (Non-Medical): No  Physical Activity: Sufficiently Active (02/05/2021)   Exercise Vital Sign    Days of Exercise per Week: 5 days    Minutes of Exercise per Session: 30 min  Stress: No Stress Concern Present (02/05/2021)   [employer] of  Occupational Health - Occupational Stress Questionnaire    Feeling of Stress : Not at all  Social Connections: Moderately Integrated (02/05/2021)   Social Connection and Isolation Panel [NHANES]    Frequency of Communication with Friends and Family: More than three times a week    Frequency of Social Gatherings with Friends and Family: More than three times a week    Attends Religious Services: More than 4 times per year    Active Member of [employer] or Organizations: Yes    Attends Archivist Meetings: More than 4 times per year    Marital Status: Widowed     Family History: The patient's family history includes Diabetes in his brother, sister, and sister; Heart disease in his father; Hypertension in his father; Prostate cancer in his brother. There is no history of Kidney disease, Kidney cancer, Bladder Cancer, Colon cancer, Esophageal cancer, Inflammatory bowel disease, Liver disease, Pancreatic cancer, Rectal cancer, or Stomach cancer.  ROS:   Please see the history of present illness.    All other systems reviewed and are negative.  EKGs/Labs/Other Studies Reviewed:    The following studies were reviewed today:  August 28, 2021 in clinic device interrogation personally viewed Battery longevity 14.9 years Lead parameters stable Changed programming to AAIR to DDDR 60-1 20 Atrial pacing 2.1% Ventricular pacing 2.3%  EKG today personally reviewed and shows sinus rhythm, right bundle branch block, left  anterior fascicular block  Recent Labs: 05/10/2021: ALT 16; B Natriuretic Peptide 47.1 05/18/2021: Magnesium 1.7 05/20/2021: BUN 19; Creatinine, Ser 1.02; Hemoglobin 11.6; Platelets 124; Potassium 4.2; Sodium 136  Recent Lipid Panel    Component Value Date/Time   CHOL 122 01/16/2021 1148   CHOL 131 08/27/2015 1158   TRIG 73 01/16/2021 1148   HDL 49 01/16/2021 1148   HDL 40 08/27/2015 1158   CHOLHDL 2.5 01/16/2021 1148   VLDL 20 07/28/2016 1248   LDLCALC 58 01/16/2021 1148    Physical Exam:    VS:  BP 128/76   Pulse 85   Ht '5\' 9"'  (1.753 m)   Wt 294 lb 12.8 oz (133.7 kg)   SpO2 97%   BMI 43.53 kg/m     Wt Readings from Last 3 Encounters:  08/28/21 294 lb 12.8 oz (133.7 kg)  08/27/21 296 lb (134.3 kg)  08/20/21 296 lb 4 oz (134.4 kg)     GEN:  Well nourished, well developed in no acute distress HEENT: Normal NECK: No JVD; No carotid bruits LYMPHATICS: No lymphadenopathy CARDIAC: RRR, no murmurs, rubs, gallops.  Pacemaker pocket well-healed RESPIRATORY:  Clear to auscultation without rales, wheezing or rhonchi  ABDOMEN: Soft, non-tender, non-distended MUSCULOSKELETAL:  No edema; No deformity  SKIN: Warm and dry NEUROLOGIC:  Alert and oriented x 3 PSYCHIATRIC:  Normal affect        ASSESSMENT:    1. Complete heart block ([place])   2. Severe aortic stenosis   3. S/P TAVR (transcatheter aortic valve replacement)    PLAN:    In order of problems listed above:   #Severe aortic stenosis #TAVR #Complete heart block The patient is doing well after his permanent pacemaker implant following TAVR.  He is recovered his AV conduction following the TAVR and is pacing very little.  I have reprogrammed his device to AAIR to DDDR to minimize ventricular pacing.  He will continue remote monitoring.  Follow-up in our clinic in 1 year or sooner as needed.  APP appointment okay.   Medication Adjustments/Labs and Tests Ordered:  Current medicines are reviewed at length with the  patient today.  Concerns regarding medicines are outlined above.  No orders of the defined types were placed in this encounter.  No orders of the defined types were placed in this encounter.    Signed, Lars Mage, MD, [facility], [facility] 08/28/2021 2:08 PM    Electrophysiology [facility]

## 2021-08-30 NOTE — Progress Notes (Signed)
Remote pacemaker transmission.   

## 2021-09-10 ENCOUNTER — Other Ambulatory Visit: Payer: Self-pay

## 2021-09-10 DIAGNOSIS — E1140 Type 2 diabetes mellitus with diabetic neuropathy, unspecified: Secondary | ICD-10-CM

## 2021-09-10 MED ORDER — TRUE METRIX AIR GLUCOSE METER DEVI: 1.0000 | Freq: Once | 0 refills | Status: AC

## 2021-09-10 MED ORDER — BD SWAB SINGLE USE REGULAR PADS: 1.0000 | MEDICATED_PAD | 3 refills | Status: AC | PRN

## 2021-09-10 MED ORDER — TRUE METRIX LEVEL 1 LOW VI SOLN: 1.0000 | Freq: Once | 0 refills | Status: AC

## 2021-09-10 MED ORDER — TRUEPLUS LANCETS 33G MISC: 1.0000 | 3 refills | Status: DC

## 2021-09-10 MED ORDER — TRUE METRIX BLOOD GLUCOSE TEST VI STRP: ORAL_STRIP | 12 refills | Status: DC

## 2021-09-12 ENCOUNTER — Ambulatory Visit: Payer: Medicare HMO | Admitting: Podiatry

## 2021-09-19 ENCOUNTER — Ambulatory Visit (INDEPENDENT_AMBULATORY_CARE_PROVIDER_SITE_OTHER): Payer: Medicare HMO | Admitting: Podiatry

## 2021-09-19 ENCOUNTER — Encounter: Payer: Self-pay | Admitting: Podiatry

## 2021-09-19 DIAGNOSIS — I739 Peripheral vascular disease, unspecified: Secondary | ICD-10-CM | POA: Diagnosis not present

## 2021-09-19 DIAGNOSIS — E1142 Type 2 diabetes mellitus with diabetic polyneuropathy: Secondary | ICD-10-CM | POA: Diagnosis not present

## 2021-09-19 DIAGNOSIS — B351 Tinea unguium: Secondary | ICD-10-CM | POA: Diagnosis not present

## 2021-09-19 DIAGNOSIS — N1831 Chronic kidney disease, stage 3a: Secondary | ICD-10-CM

## 2021-09-19 DIAGNOSIS — M79676 Pain in unspecified toe(s): Secondary | ICD-10-CM

## 2021-09-19 NOTE — Progress Notes (Signed)
Complaint:  Visit Type: Patient returns to my office for risk foot care.  This patient requires this care by a professional since this patient will be at a high risk due to having diabetes.  This patient is unable to cut his own nails since he cannot reach his nails.  This patient presents for at risk foot  care.  Podiatric Exam: Vascular: dorsalis pedis and posterior tibial pulses are  weakly palpable bilateral. Capillary return is immediate. Temperature gradient is WNL. Skin turgor WNL  Sensorium: Normal Semmes Weinstein monofilament test. Normal tactile sensation bilaterally. Nail Exam: Pt has thick disfigured discolored nails with subungual debris noted bilateral entire nail hallux through fifth toenails.  Nails are thick painful and deformed 1-5  B/L. Ulcer Exam: There is no evidence of ulcer or pre-ulcerative changes or infection. Orthopedic Exam: Muscle tone and strength are WNL. No limitations in general ROM. No crepitus or effusions noted. HAV  B/L. Hammer toes second  B/L Skin: No Porokeratosis. No infection or ulcers.  Corn second toe medial aspect @ PIPJ. Right foot.  Diagnosis:  Onychomycosis, , Pain in right toe, pain in left toes, Corn/callus  Treatment & Plan Procedures and Treatment: Consent by patient was obtained for treatment procedures. The patient understood the discussion of treatment and procedures well. All questions were answered thoroughly reviewed. Debridement of mycotic and hypertrophic toenails, 1 through 5 bilateral and clearing of subungual debris. No ulceration, no infection noted.   Told this patient to return for periodic foot evaluation to help reduce potential of at risk complications.  Gave him  pads due to corn. Return Visit-Office Procedure: Patient instructed to return to the office for a follow up visit 3 months for continued evaluation and treatment.    Raghad Lorenz DPM 

## 2021-09-26 ENCOUNTER — Ambulatory Visit (HOSPITAL_COMMUNITY)
Admission: RE | Admit: 2021-09-26 | Discharge: 2021-09-26 | Disposition: A | Discharge status: Home / Self Care | Payer: Medicare HMO | Source: Ambulatory Visit | Attending: Urology | Admitting: Urology

## 2021-09-26 ENCOUNTER — Encounter: Payer: Self-pay | Admitting: Cardiology

## 2021-09-26 DIAGNOSIS — I82220 Acute embolism and thrombosis of inferior vena cava: Secondary | ICD-10-CM | POA: Diagnosis not present

## 2021-09-26 DIAGNOSIS — Q631 Lobulated, fused and horseshoe kidney: Secondary | ICD-10-CM | POA: Diagnosis not present

## 2021-09-26 DIAGNOSIS — N2889 Other specified disorders of kidney and ureter: Secondary | ICD-10-CM | POA: Diagnosis not present

## 2021-09-26 DIAGNOSIS — K5730 Diverticulosis of large intestine without perforation or abscess without bleeding: Secondary | ICD-10-CM | POA: Diagnosis not present

## 2021-09-26 DIAGNOSIS — Q6102 Congenital multiple renal cysts: Secondary | ICD-10-CM | POA: Diagnosis not present

## 2021-09-26 DIAGNOSIS — D49511 Neoplasm of unspecified behavior of right kidney: Secondary | ICD-10-CM | POA: Diagnosis not present

## 2021-09-26 MED ORDER — GADOBUTROL 1 MMOL/ML IV SOLN
10.0000 mL | Freq: Once | INTRAVENOUS | Status: AC | PRN
  Administered 2021-09-26: 10 mL via INTRAVENOUS

## 2021-09-26 NOTE — Progress Notes (Signed)
Patient here today at [facility] for MRI abdomen w wo contrast. Patient has medtronic device. CLE sent. Orders received for ODO. Will re-program once scan is completed

## 2021-09-26 NOTE — Progress Notes (Signed)
Informed of MRI for today.   Device system confirmed to be MRI conditional, with implant date > 6 weeks ago, and no evidence of abandoned or epicardial leads in review of most recent CXR Interrogation from today reviewed, pt is currently AS-VS with minimal pacing. Indication is intermittent second degree AV block Change device settings for MRI to ODO   Tachy-therapies to off if applicable.  Program device back to pre-MRI settings after completion of exam.  Annamaria Helling  09/26/2021 2:05 PM

## 2021-10-23 ENCOUNTER — Other Ambulatory Visit: Payer: Self-pay | Admitting: Cardiovascular Disease

## 2021-10-24 ENCOUNTER — Other Ambulatory Visit: Payer: Self-pay | Admitting: Family Medicine

## 2021-10-24 ENCOUNTER — Other Ambulatory Visit: Payer: Self-pay

## 2021-10-24 ENCOUNTER — Telehealth: Payer: Self-pay | Admitting: Family Medicine

## 2021-10-24 NOTE — Telephone Encounter (Signed)
Called and left vm to reach out to Dr. Rockey Situ for Furosemide refill. I also let him know to reach out to Dr. Louis Meckel for MRI results. I left him [facility] office number.

## 2021-10-24 NOTE — Telephone Encounter (Signed)
Pt is requesting that his furosemide be sent to [facility]. He is not completely out yet. He was informed that the refills was sent to mail order company in June but he want it to go local pharmacy.   Pt also had MRI in June or first of July and have not heard the results. He does not remember who ordered the MRI

## 2021-10-28 ENCOUNTER — Other Ambulatory Visit: Payer: Self-pay | Admitting: Cardiovascular Disease

## 2021-11-20 ENCOUNTER — Ambulatory Visit (INDEPENDENT_AMBULATORY_CARE_PROVIDER_SITE_OTHER): Payer: Medicare HMO

## 2021-11-20 DIAGNOSIS — I442 Atrioventricular block, complete: Secondary | ICD-10-CM | POA: Diagnosis not present

## 2021-11-20 LAB — CUP PACEART REMOTE DEVICE CHECK
Battery Remaining Longevity: 177 mo
Battery Voltage: 3.17 V
Brady Statistic AP VP Percent: 0.01 %
Brady Statistic AP VS Percent: 2.91 %
Brady Statistic AS VP Percent: 0.03 %
Brady Statistic AS VS Percent: 97.05 %
Brady Statistic RA Percent Paced: 2.96 %
Brady Statistic RV Percent Paced: 0.04 %
Date Time Interrogation Session: 20230906040753
Implantable Lead Implant Date: 20230307
Implantable Lead Implant Date: 20230307
Implantable Lead Location: 753859
Implantable Lead Location: 753860
Implantable Lead Model: 3830
Implantable Lead Model: 5076
Implantable Pulse Generator Implant Date: 20230307
Lead Channel Impedance Value: 342 Ohm
Lead Channel Impedance Value: 399 Ohm
Lead Channel Impedance Value: 399 Ohm
Lead Channel Impedance Value: 703 Ohm
Lead Channel Pacing Threshold Amplitude: 0.625 V
Lead Channel Pacing Threshold Amplitude: 1.125 V
Lead Channel Pacing Threshold Pulse Width: 0.4 ms
Lead Channel Pacing Threshold Pulse Width: 0.4 ms
Lead Channel Sensing Intrinsic Amplitude: 18.375 mV
Lead Channel Sensing Intrinsic Amplitude: 18.375 mV
Lead Channel Sensing Intrinsic Amplitude: 4.875 mV
Lead Channel Sensing Intrinsic Amplitude: 4.875 mV
Lead Channel Setting Pacing Amplitude: 1.5 V
Lead Channel Setting Pacing Amplitude: 2.25 V
Lead Channel Setting Pacing Pulse Width: 0.4 ms
Lead Channel Setting Sensing Sensitivity: 1.2 mV

## 2021-12-09 NOTE — Progress Notes (Signed)
Remote pacemaker transmission.   

## 2021-12-11 DIAGNOSIS — D49511 Neoplasm of unspecified behavior of right kidney: Secondary | ICD-10-CM | POA: Diagnosis not present

## 2021-12-16 ENCOUNTER — Encounter: Payer: Self-pay | Admitting: Family Medicine

## 2021-12-19 ENCOUNTER — Ambulatory Visit: Payer: Medicare HMO | Admitting: Urology

## 2021-12-19 DIAGNOSIS — N401 Enlarged prostate with lower urinary tract symptoms: Secondary | ICD-10-CM

## 2021-12-19 DIAGNOSIS — N471 Phimosis: Secondary | ICD-10-CM

## 2021-12-27 ENCOUNTER — Ambulatory Visit: Payer: Medicare HMO | Admitting: Family Medicine

## 2021-12-27 NOTE — Progress Notes (Unsigned)
Name: Jordan [name]   MRN: [id number]    DOB: 25-Apr-1940   Date:12/30/2021       Progress Note  Subjective  Chief Complaint  Follow Up  HPI  DM with microalbuminuria , obesity, dyslipidemia and HTN and  ED: he is back on Glipizide '5mg'$  ER , Metformin 1500 mg daily, A1C is going up again, from 7.5 % to 8.1%. Discussed SGL-2 agonist due to microalbuminuria - however he states only can afford generic medications so we will try Actos He cannot take ACE because of history of angioedema, bp is controlled with Norvasc.  He denies polyphagia, polydipsia but has polyuria - also has BPH and has nocturia, denies recent episodes of hypoglycemia.    Chronic bronchitis: he used to smoke but quit many years ago, he still has a cough that is productive at times, coughs in the mornings and has a pale yellow sputum, he denies  wheezing  He has some sob with activity , he states mild improvement since he had valve replacement    BPH: [facility] by [facility] and symptoms are stable, on Flomax but still has nocturia about 2-3 times per night  Unchanged    HTN:  he is compliant with mediations,  no chest pain,, he has some SOB with activity .He has lower extremity edema but mild and has noticed occasional dizziness when standing up, bp is towards low end of normal, we will decrease dose of norvasc to 7.5 mg daily    Hyperlipidemia: taking lipitor and denies side effects, no myalgia. He will have labs done today    Morbid obese : BMI above 40, he is trying to eat healthy - eating mostly chicken, using a hiking stick and walks 30 minutes about 5 days a week, but weight is going up, he states he has been add a little more carbs lately    Aortic Valve stenosis: under the care of cardiologist - Dr. Rockey Situ,   no syncope or chest pain, had echo normal EF. 50-55 %, Echo showed ascending aorta dilation. He is now also seeing Dr. Cyndia Bent cardiovascular surgeon in [place] , he had TAVR 05/14/2021 - doing well since.    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 Lovena Le. He denies orthopnea or palpitation    Atherosclerosis of aorta: he is on statin therapy, taking aspirin 81 mg daily and no side effects Last  LDL was at goal at 58 . We will recheck labs today    Iron deficiency anemia:  secondary to rectal bleeding, last HCT dropped again but he has not noticed any bleeding, he had hemorrhoids and diverticulosis, he was released from Dr. Allen Norris . Last HCT was slightly lower at 35.5 after admission for pacemaker 05/2021, he is still taking iron pills and we will recheck levels.    Gout: taking allopurinol and not on diuretics  Last uric acid was normal . No recent flares . We will recheck labs today   Claudication:  he states no longer walking one hour because he has to take multiple breaks. He has been able to walks at most 25 minutes but has to stop half way through .  He was seen by vascular surgeon and felt likely secondary from lumbar spine problems. Discussed risk and benefits of therapy for lumbar spine stenosis and since symptoms only when walking he will continue physical activity and stop to take breaks as needed .   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   Coronary artery disease involving native coronary artery of native heart without angina pectoris    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   1st degree AV block 05/15/2021   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   History of angina 02/12/2021   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[place]) 04/24/2020   PAD (peripheral artery disease) ([place]) 01/17/2020   Diverticulosis of colon with hemorrhage 05/26/2019   Atherosclerosis of aorta ([place]) 04/26/2019   Rectal bleed 04/22/2019   Chronic bronchitis ([place]) 07/30/2018   Bilateral hydrocele 11/27/2015   Ventral hernia without obstruction or gangrene 11/27/2015   Controlled type 2 diabetes mellitus with microalbuminuria ([place]) 10/26/2015   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 ([place]) 11/07/2014   Hyperlipidemia 11/07/2014   Essential hypertension 11/07/2014   Obesity, Class III, BMI 40-49.9 (morbid obesity) ([place]) 11/07/2014   Controlled gout 11/07/2014   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   Constipation 10/26/2012   Incisional hernia, without obstruction or gangrene 10/26/2012   Left inguinal hernia     Past Surgical History:  Procedure Laterality Date   cataract surgery     COLONOSCOPY  03/17/2010   COLONOSCOPY N/A 04/22/2019   Procedure: COLONOSCOPY;  Surgeon: Irving Copas., MD;  Location: [facility];  Service: Gastroenterology;  Laterality: N/A;   EYE SURGERY  03/17/2009   cataract   HERNIA REPAIR     INTRAOPERATIVE TRANSTHORACIC ECHOCARDIOGRAM N/A 05/14/2021   Procedure: INTRAOPERATIVE TRANSTHORACIC ECHOCARDIOGRAM;  Surgeon: Sherren Mocha, MD;  Location: [facility];  Service: Open Heart Surgery;  Laterality: N/A;   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   Procedure: PACEMAKER IMPLANT;  Surgeon: Evans Lance, MD;  Location: [facility];  Service:  Cardiovascular;  Laterality: N/A;   RIGHT HEART CATH AND CORONARY ANGIOGRAPHY Bilateral 02/15/2021   Procedure: RIGHT HEART CATH AND CORONARY ANGIOGRAPHY;  Surgeon: Nelva Bush, MD;  Location: [facility];  Service: Cardiovascular;  Laterality: Bilateral;   TRANSCATHETER AORTIC VALVE REPLACEMENT, TRANSFEMORAL N/A 05/14/2021   Procedure: Transcatheter Aortic Valve Replacement, Transfemoral;  Surgeon: Sherren Mocha, MD;  Location: [facility];  Service: Open Heart Surgery;  Laterality: N/A;   UMBILICAL HERNIA REPAIR  03/17/2009    Family History  Problem Relation Age of Onset   Heart disease Father    Hypertension Father    Diabetes Sister    Diabetes Brother    Prostate cancer Brother    Diabetes Sister    Kidney disease Neg Hx    Kidney cancer Neg Hx    Bladder Cancer Neg Hx    Colon cancer Neg Hx    Esophageal cancer Neg Hx  Inflammatory bowel disease Neg Hx    Liver disease Neg Hx    Pancreatic cancer Neg Hx    Rectal cancer Neg Hx    Stomach cancer Neg Hx     Social History   Tobacco Use   Smoking status: Former    Packs/day: 1.00    Years: 10.00    Total pack years: 10.00    Types: Cigarettes    Start date: 03/17/1965    Quit date: 11/27/1975    Years since quitting: 46.1   Smokeless tobacco: Never   Tobacco comments:    quit 40 years  Substance Use Topics   Alcohol use: Yes    Alcohol/week: 1.0 standard drink of alcohol    Types: 1 Standard drinks or equivalent per week    Comment: socially - 1 x year     Current Outpatient Medications:    acetaminophen (TYLENOL) 325 MG tablet, Take 2 tablets (650 mg total) by mouth every 4 (four) hours as needed for headache or mild pain., Disp: , Rfl:    Alcohol Swabs (B-D SINGLE USE SWABS REGULAR) PADS, 1 each by Does not apply route as needed., Disp: 100 each, Rfl: 3   allopurinol (ZYLOPRIM) 300 MG tablet, Take 1 tablet (300 mg total) by mouth daily., Disp: 90 tablet, Rfl: 1   amLODipine (NORVASC)  2.5 MG tablet, Take 1 tablet (2.5 mg total) by mouth daily. With the 5 mg dose, Disp: 90 tablet, Rfl: 1   amLODipine (NORVASC) 5 MG tablet, Take 1 tablet (5 mg total) by mouth daily. Take with the 2.5 mg tablet, Disp: 90 tablet, Rfl: 1   aspirin EC 81 MG tablet, Take 1 tablet (81 mg total) by mouth daily. Swallow whole., Disp: , Rfl:    atorvastatin (LIPITOR) 40 MG tablet, Take 1 tablet (40 mg total) by mouth at bedtime., Disp: 90 tablet, Rfl: 1   Cholecalciferol (VITAMIN D3 PO), Take 1 tablet by mouth every Monday, Wednesday, and Friday., Disp: , Rfl:    ferrous sulfate 325 (65 FE) MG tablet, Take 325 mg by mouth daily., Disp: , Rfl:    furosemide (LASIX) 20 MG tablet, Take 1 tablet by mouth once daily, Disp: 90 tablet, Rfl: 2   glucose blood (TRUE METRIX BLOOD GLUCOSE TEST) test strip, Use as instructed, Disp: 100 each, Rfl: 12   latanoprost (XALATAN) 0.005 % ophthalmic solution, Place 1 drop into the left eye at bedtime. , Disp: , Rfl:    Omega-3 Fatty Acids (FISH OIL PO), Take 1 capsule by mouth 2 (two) times daily. , Disp: , Rfl:    pioglitazone (ACTOS) 15 MG tablet, Take 1 tablet (15 mg total) by mouth daily., Disp: 90 tablet, Rfl: 1   polyethylene glycol (MIRALAX / GLYCOLAX) packet, Take 17 g by mouth at bedtime. , Disp: , Rfl:    tadalafil (CIALIS) 10 MG tablet, Take 1-2 tablets (10-20 mg total) by mouth every three (3) days as needed for erectile dysfunction., Disp: 30 tablet, Rfl: 0   TRUEplus Lancets 33G MISC, 1 Box by Does not apply route every 30 (thirty) days., Disp: 50 each, Rfl: 3   Blood Glucose Calibration (TRUE METRIX LEVEL 1) Low SOLN, 1 Bottle by In Vitro route once for 1 dose., Disp: 1 each, Rfl: 0   Blood Glucose Monitoring Suppl (TRUE METRIX AIR GLUCOSE METER) DEVI, 1 each by Does not apply route once for 1 dose., Disp: 1 each, Rfl: 0   glipiZIDE (GLUCOTROL XL) 5 MG 24 hr tablet,  Take 1 tablet (5 mg total) by mouth daily with breakfast., Disp: 90 tablet, Rfl: 1   metFORMIN  (GLUCOPHAGE-XR) 750 MG 24 hr tablet, Take 2 tablets (1,500 mg total) by mouth daily with breakfast., Disp: 180 tablet, Rfl: 1   tamsulosin (FLOMAX) 0.4 MG CAPS capsule, Take 1 capsule (0.4 mg total) by mouth daily., Disp: 90 capsule, Rfl: 1  Allergies  Allergen Reactions   Ace Inhibitors Other (See Comments)    angioedema    I personally reviewed active problem list, medication list, allergies, family history, social history, health maintenance with the patient/caregiver today.   ROS  Ten systems reviewed and is negative except as mentioned in HPI   Objective  Vitals:   12/30/21 1350  BP: 118/74  Pulse: 90  Resp: 16  SpO2: 96%  Weight: 299 lb (135.6 kg)  Height: '5\' 9"'$  (1.753 m)    Body mass index is 44.15 kg/m.  Physical Exam  Constitutional: Patient appears well-developed and well-nourished. Obese  No distress.  HEENT: head atraumatic, normocephalic, pupils equal and reactive to light, neck supple Cardiovascular: Normal rate, regular rhythm and normal heart sounds.  No murmur heard. Trace  BLE edema. Pulmonary/Chest: Effort normal and breath sounds normal. No respiratory distress. Abdominal: Soft.  There is no tenderness. Psychiatric: Patient has a normal mood and affect. behavior is normal. Judgment and thought content normal.   Recent Results (from the past 2160 hour(s))  CUP PACEART REMOTE DEVICE CHECK     Status: None   Collection Time: 11/20/21  4:07 AM  Result Value Ref Range   Date Time Interrogation Session [id number]    Pulse Generator Manufacturer MERM    Pulse Gen Model [id number] Azure XT DR MRI    Pulse Gen Serial Number [id number]    Clinic Name [facility]    Implantable Pulse Generator Type Implantable Pulse Generator    Implantable Pulse Generator Implant Date [id number]    Implantable Lead Manufacturer MERM    Implantable Lead Model 3830 SelectSecure MRI SureScan    Implantable Lead Serial Number [id number]    Implantable Lead Implant Date  18563149    Implantable Lead Location Detail 1 UNKNOWN    Implantable Lead Special Function LBBB    Implantable Lead Location [id number]    Implantable Lead Manufacturer MERM    Implantable Lead Model 5076 CapSureFix Novus MRI SureScan    Implantable Lead Serial Number [id number]    Implantable Lead Implant Date [id number]    Implantable Lead Location Detail 1 APPENDAGE    Implantable Lead Location [id number]    Lead Channel Setting Sensing Sensitivity 1.2 mV   Lead Channel Setting Pacing Amplitude 1.5 V   Lead Channel Setting Pacing Pulse Width 0.4 ms   Lead Channel Setting Pacing Amplitude 2.25 V   Lead Channel Impedance Value 399 ohm   Lead Channel Impedance Value 342 ohm   Lead Channel Sensing Intrinsic Amplitude 4.875 mV   Lead Channel Sensing Intrinsic Amplitude 4.875 mV   Lead Channel Pacing Threshold Amplitude 0.625 V   Lead Channel Pacing Threshold Pulse Width 0.4 ms   Lead Channel Impedance Value 703 ohm   Lead Channel Impedance Value 399 ohm   Lead Channel Sensing Intrinsic Amplitude 18.375 mV   Lead Channel Sensing Intrinsic Amplitude 18.375 mV   Lead Channel Pacing Threshold Amplitude 1.125 V   Lead Channel Pacing Threshold Pulse Width 0.4 ms   Battery Status OK    Battery Remaining Longevity 177 mo   Battery Voltage 3.17 V  Brady Statistic RA Percent Paced 2.96 %   Brady Statistic RV Percent Paced 0.04 %   Brady Statistic AP VP Percent 0.01 %   Brady Statistic AS VP Percent 0.03 %   Brady Statistic AP VS Percent 2.91 %   Brady Statistic AS VS Percent 97.05 %  POCT HgB A1C     Status: Abnormal   Collection Time: 12/30/21  2:00 PM  Result Value Ref Range   Hemoglobin A1C 8.1 (A) 4.0 - 5.6 %   HbA1c POC (<> result, manual entry)     HbA1c, POC (prediabetic range)     HbA1c, POC (controlled diabetic range)      Diabetic Foot Exam: Diabetic Foot Exam - Simple   Simple Foot Form Visual Inspection See comments: Yes Sensation Testing Intact to touch and monofilament  testing bilaterally: Yes Pulse Check Comments Thick nails, dry heels       PHQ2/9:    12/30/2021    1:50 PM 08/27/2021    1:44 PM 04/17/2021   11:02 AM 02/05/2021   10:08 AM 01/16/2021   10:44 AM  Depression screen PHQ 2/9  Decreased Interest 0 0 0 0 0  Down, Depressed, Hopeless 0 0 0 0 0  PHQ - 2 Score 0 0 0 0 0  Altered sleeping 0 0 0 0 0  Tired, decreased energy 0 0 0 3 3  Change in appetite 0 0 0 0 0  Feeling bad or failure about yourself  0 0 0 0 0  Trouble concentrating 0 0 0 0 0  Moving slowly or fidgety/restless 0 0 0 0 0  Suicidal thoughts 0 0 0 0 0  PHQ-9 Score 0 0 0 3 3    phq 9 is negative   Fall Risk:    12/30/2021    1:50 PM 08/27/2021    1:44 PM 04/17/2021   11:02 AM 02/05/2021   10:10 AM 01/16/2021   10:44 AM  Fall Risk   Falls in the past year? 0 0 0 0 0  Number falls in past yr: 0 0 0 0 0  Injury with Fall? 0 0 0 0 0  Risk for fall due to : No Fall Risks No Fall Risks;Impaired balance/gait No Fall Risks No Fall Risks No Fall Risks  Follow up Falls prevention discussed Falls prevention discussed Falls prevention discussed Falls prevention discussed Falls prevention discussed      Functional Status Survey: Is the patient deaf or have difficulty hearing?: Yes Does the patient have difficulty seeing, even when wearing glasses/contacts?: No Does the patient have difficulty concentrating, remembering, or making decisions?: No Does the patient have difficulty walking or climbing stairs?: Yes Does the patient have difficulty dressing or bathing?: No Does the patient have difficulty doing errands alone such as visiting a doctor's office or shopping?: No    Assessment & Plan  1. Controlled type 2 diabetes mellitus with microalbuminuria, without long-term current use of insulin (HCC)  - POCT HgB A1C - Urine Microalbumin w/creat. ratio - glipiZIDE (GLUCOTROL XL) 5 MG 24 hr tablet; Take 1 tablet (5 mg total) by mouth daily with breakfast.  Dispense: 90  tablet; Refill: 1 - metFORMIN (GLUCOPHAGE-XR) 750 MG 24 hr tablet; Take 2 tablets (1,500 mg total) by mouth daily with breakfast.  Dispense: 180 tablet; Refill: 1 - pioglitazone (ACTOS) 15 MG tablet; Take 1 tablet (15 mg total) by mouth daily.  Dispense: 90 tablet; Refill: 1 - COMPLETE METABOLIC PANEL WITH GFR  2. Need for  immunization against influenza  - Flu Vaccine QUAD High Dose(Fluad)  3. Benign prostatic hyperplasia with nocturia  - tamsulosin (FLOMAX) 0.4 MG CAPS capsule; Take 1 capsule (0.4 mg total) by mouth daily.  Dispense: 90 capsule; Refill: 1  4. Controlled gout  - Uric acid  5. 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HCC)  - Lipid panel  6. Atherosclerosis of aorta (HCC)  - Lipid panel  7. Claudication of both lower extremities ([place])  Seen by vascular surgeon in the past, stable and seems to be neurogenic   8. Simple chronic bronchitis (HCC)  Stable, discussed medication but he is afraid of cost  9. Hypertension associated with diabetes ([place])  - amLODipine (NORVASC) 5 MG tablet; Take 1 tablet (5 mg total) by mouth daily. Take with the 2.5 mg tablet  Dispense: 90 tablet; Refill: 1 - amLODipine (NORVASC) 2.5 MG tablet; Take 1 tablet (2.5 mg total) by mouth daily. With the 5 mg dose  Dispense: 90 tablet; Refill: 1 - pioglitazone (ACTOS) 15 MG tablet; Take 1 tablet (15 mg total) by mouth daily.  Dispense: 90 tablet; Refill: 1  10. Thrombocytopenia ([place])  Recheck labs   11. Obesity, Class III, BMI 40-49.9 (morbid obesity) ([place])  Discussed with the patient the risk posed by an increased BMI. Discussed importance of portion control, calorie counting and at least 150 minutes of physical activity weekly. Avoid sweet beverages and drink more water. Eat at least 6 servings of fruit and vegetables daily    12. Iron deficiency anemia due to chronic blood loss  - CBC with Differential/Platelet - Iron, TIBC and Ferritin Panel

## 2021-12-30 ENCOUNTER — Encounter: Payer: Self-pay | Admitting: Family Medicine

## 2021-12-30 ENCOUNTER — Ambulatory Visit (INDEPENDENT_AMBULATORY_CARE_PROVIDER_SITE_OTHER): Payer: Medicare HMO | Admitting: Family Medicine

## 2021-12-30 VITALS — BP 118/74 | HR 90 | Resp 16 | Ht 69.0 in | Wt 299.0 lb

## 2021-12-30 DIAGNOSIS — I739 Peripheral vascular disease, unspecified: Secondary | ICD-10-CM | POA: Diagnosis not present

## 2021-12-30 DIAGNOSIS — I700 Atherosclerosis of aorta: Secondary | ICD-10-CM | POA: Diagnosis not present

## 2021-12-30 DIAGNOSIS — R351 Nocturia: Secondary | ICD-10-CM

## 2021-12-30 DIAGNOSIS — R809 Proteinuria, unspecified: Secondary | ICD-10-CM | POA: Diagnosis not present

## 2021-12-30 DIAGNOSIS — Z23 Encounter for immunization: Secondary | ICD-10-CM | POA: Diagnosis not present

## 2021-12-30 DIAGNOSIS — E1159 Type 2 diabetes mellitus with other circulatory complications: Secondary | ICD-10-CM | POA: Diagnosis not present

## 2021-12-30 DIAGNOSIS — M109 Gout, unspecified: Secondary | ICD-10-CM

## 2021-12-30 DIAGNOSIS — E1129 Type 2 diabetes mellitus with other diabetic kidney complication: Secondary | ICD-10-CM

## 2021-12-30 DIAGNOSIS — D696 Thrombocytopenia, unspecified: Secondary | ICD-10-CM | POA: Diagnosis not present

## 2021-12-30 DIAGNOSIS — I77810 Thoracic aortic ectasia: Secondary | ICD-10-CM

## 2021-12-30 DIAGNOSIS — D500 Iron deficiency anemia secondary to blood loss (chronic): Secondary | ICD-10-CM

## 2021-12-30 DIAGNOSIS — J410 Simple chronic bronchitis: Secondary | ICD-10-CM | POA: Diagnosis not present

## 2021-12-30 DIAGNOSIS — N401 Enlarged prostate with lower urinary tract symptoms: Secondary | ICD-10-CM | POA: Diagnosis not present

## 2021-12-30 DIAGNOSIS — E1140 Type 2 diabetes mellitus with diabetic neuropathy, unspecified: Secondary | ICD-10-CM

## 2021-12-30 DIAGNOSIS — I152 Hypertension secondary to endocrine disorders: Secondary | ICD-10-CM

## 2021-12-30 LAB — POCT GLYCOSYLATED HEMOGLOBIN (HGB A1C): Hemoglobin A1C: 8.1 % — AB (ref 4.0–5.6)

## 2021-12-30 MED ORDER — AMLODIPINE BESYLATE 2.5 MG PO TABS: 2.5000 mg | ORAL_TABLET | Freq: Every day | ORAL | 1 refills | Status: DC

## 2021-12-30 MED ORDER — AMLODIPINE BESYLATE 5 MG PO TABS: 5.0000 mg | ORAL_TABLET | Freq: Every day | ORAL | 1 refills | Status: DC

## 2021-12-30 MED ORDER — PIOGLITAZONE HCL 15 MG PO TABS: 15.0000 mg | ORAL_TABLET | Freq: Every day | ORAL | 1 refills | Status: DC

## 2021-12-30 MED ORDER — TAMSULOSIN HCL 0.4 MG PO CAPS: 0.4000 mg | ORAL_CAPSULE | Freq: Every day | ORAL | 1 refills | Status: DC

## 2021-12-30 MED ORDER — GLIPIZIDE ER 5 MG PO TB24: 5.0000 mg | ORAL_TABLET | Freq: Every day | ORAL | 1 refills | Status: DC

## 2021-12-30 MED ORDER — METFORMIN HCL ER 750 MG PO TB24: 1500.0000 mg | ORAL_TABLET | Freq: Every day | ORAL | 1 refills | Status: DC

## 2021-12-30 NOTE — Patient Instructions (Signed)
Get Shingrix , RSV and covid vaccines

## 2021-12-31 LAB — CBC WITH DIFFERENTIAL/PLATELET
Absolute Monocytes: 456 cells/uL (ref 200–950)
Basophils Absolute: 29 cells/uL (ref 0–200)
Basophils Relative: 0.6 %
Eosinophils Absolute: 67 cells/uL (ref 15–500)
Eosinophils Relative: 1.4 %
HCT: 41.1 % (ref 38.5–50.0)
Hemoglobin: 13.8 g/dL (ref 13.2–17.1)
Lymphs Abs: 1051 cells/uL (ref 850–3900)
MCH: 29.6 pg (ref 27.0–33.0)
MCHC: 33.6 g/dL (ref 32.0–36.0)
MCV: 88.2 fL (ref 80.0–100.0)
MPV: 11.7 fL (ref 7.5–12.5)
Monocytes Relative: 9.5 %
Neutro Abs: 3197 cells/uL (ref 1500–7800)
Neutrophils Relative %: 66.6 %
Platelets: 177 10*3/uL (ref 140–400)
RBC: 4.66 10*6/uL (ref 4.20–5.80)
RDW: 15.2 % — ABNORMAL HIGH (ref 11.0–15.0)
Total Lymphocyte: 21.9 %
WBC: 4.8 10*3/uL (ref 3.8–10.8)

## 2021-12-31 LAB — COMPLETE METABOLIC PANEL WITH GFR
AG Ratio: 1.1 (calc) (ref 1.0–2.5)
ALT: 16 U/L (ref 9–46)
AST: 22 U/L (ref 10–35)
Albumin: 4 g/dL (ref 3.6–5.1)
Alkaline phosphatase (APISO): 53 U/L (ref 35–144)
BUN: 18 mg/dL (ref 7–25)
CO2: 27 mmol/L (ref 20–32)
Calcium: 9.7 mg/dL (ref 8.6–10.3)
Chloride: 100 mmol/L (ref 98–110)
Creat: 0.97 mg/dL (ref 0.70–1.22)
Globulin: 3.7 g/dL (calc) (ref 1.9–3.7)
Glucose, Bld: 264 mg/dL — ABNORMAL HIGH (ref 65–99)
Potassium: 5.2 mmol/L (ref 3.5–5.3)
Sodium: 135 mmol/L (ref 135–146)
Total Bilirubin: 0.7 mg/dL (ref 0.2–1.2)
Total Protein: 7.7 g/dL (ref 6.1–8.1)
eGFR: 79 mL/min/{1.73_m2} (ref >=60)

## 2021-12-31 LAB — LIPID PANEL
Cholesterol: 143 mg/dL (ref <200)
HDL: 43 mg/dL (ref >=40)
LDL Cholesterol (Calc): 81 mg/dL (calc)
Non-HDL Cholesterol (Calc): 100 mg/dL (calc) (ref <130)
Total CHOL/HDL Ratio: 3.3 (calc) (ref <5.0)
Triglycerides: 96 mg/dL (ref <150)

## 2021-12-31 LAB — URIC ACID: Uric Acid, Serum: 3.6 mg/dL — ABNORMAL LOW (ref 4.0–8.0)

## 2021-12-31 LAB — IRON,TIBC AND FERRITIN PANEL
%SAT: 25 % (calc) (ref 20–48)
Ferritin: 61 ng/mL (ref 24–380)
Iron: 85 ug/dL (ref 50–180)
TIBC: 340 mcg/dL (calc) (ref 250–425)

## 2021-12-31 LAB — MICROALBUMIN / CREATININE URINE RATIO
Creatinine, Urine: 49 mg/dL (ref 20–320)
Microalb Creat Ratio: 33 mcg/mg creat — ABNORMAL HIGH (ref <30)
Microalb, Ur: 1.6 mg/dL

## 2022-01-02 ENCOUNTER — Ambulatory Visit: Payer: Medicare HMO | Admitting: Podiatry

## 2022-01-07 ENCOUNTER — Other Ambulatory Visit: Payer: Self-pay | Admitting: Family Medicine

## 2022-01-07 DIAGNOSIS — E782 Mixed hyperlipidemia: Secondary | ICD-10-CM

## 2022-01-07 DIAGNOSIS — I700 Atherosclerosis of aorta: Secondary | ICD-10-CM

## 2022-01-07 MED ORDER — ATORVASTATIN CALCIUM 80 MG PO TABS: 80.0000 mg | ORAL_TABLET | Freq: Every day | ORAL | 0 refills | Status: DC

## 2022-01-16 ENCOUNTER — Encounter: Payer: Self-pay | Admitting: Podiatry

## 2022-01-16 ENCOUNTER — Ambulatory Visit: Payer: Medicare HMO | Admitting: Podiatry

## 2022-01-16 DIAGNOSIS — I739 Peripheral vascular disease, unspecified: Secondary | ICD-10-CM | POA: Diagnosis not present

## 2022-01-16 DIAGNOSIS — B351 Tinea unguium: Secondary | ICD-10-CM

## 2022-01-16 DIAGNOSIS — E1142 Type 2 diabetes mellitus with diabetic polyneuropathy: Secondary | ICD-10-CM | POA: Diagnosis not present

## 2022-01-16 DIAGNOSIS — N1831 Chronic kidney disease, stage 3a: Secondary | ICD-10-CM | POA: Diagnosis not present

## 2022-01-16 DIAGNOSIS — M79676 Pain in unspecified toe(s): Secondary | ICD-10-CM

## 2022-01-16 NOTE — Progress Notes (Signed)
Complaint:  Visit Type: Patient returns to my office for risk foot care.  This patient requires this care by a professional since this patient will be at a high risk due to having diabetes.  This patient is unable to cut his own nails since he cannot reach his nails.  This patient presents for at risk foot  care.  Podiatric Exam: Vascular: dorsalis pedis and posterior tibial pulses are  weakly palpable bilateral. Capillary return is immediate. Temperature gradient is WNL. Skin turgor WNL  Sensorium: Normal Semmes Weinstein monofilament test. Normal tactile sensation bilaterally. Nail Exam: Pt has thick disfigured discolored nails with subungual debris noted bilateral entire nail hallux through fifth toenails.  Nails are thick painful and deformed 1-5  B/L. Ulcer Exam: There is no evidence of ulcer or pre-ulcerative changes or infection. Orthopedic Exam: Muscle tone and strength are WNL. No limitations in general ROM. No crepitus or effusions noted. HAV  B/L. Hammer toes second  B/L Skin: No Porokeratosis. No infection or ulcers.  Corn second toe medial aspect @ PIPJ. Right foot.  Diagnosis:  Onychomycosis, , Pain in right toe, pain in left toes, Corn/callus  Treatment & Plan Procedures and Treatment: Consent by patient was obtained for treatment procedures. The patient understood the discussion of treatment and procedures well. All questions were answered thoroughly reviewed. Debridement of mycotic and hypertrophic toenails, 1 through 5 bilateral and clearing of subungual debris. No ulceration, no infection noted.   Told this patient to return for periodic foot evaluation to help reduce potential of at risk complications.  Gave him  pads due to corn. Return Visit-Office Procedure: Patient instructed to return to the office for a follow up visit 3 months for continued evaluation and treatment.    Gardiner Barefoot DPM

## 2022-01-29 ENCOUNTER — Telehealth: Payer: Self-pay | Admitting: Family Medicine

## 2022-01-29 NOTE — Telephone Encounter (Signed)
Filled out on 01/23/22 and faxed back to [facility] same day.

## 2022-01-29 NOTE — Telephone Encounter (Signed)
Patient dropped of dental form on 01/23/2022, patient states he needs clearance from PCP so he can have his teeth abstracted.

## 2022-02-11 ENCOUNTER — Ambulatory Visit (INDEPENDENT_AMBULATORY_CARE_PROVIDER_SITE_OTHER): Payer: Medicare HMO

## 2022-02-11 DIAGNOSIS — Z0000 Encounter for general adult medical examination without abnormal findings: Secondary | ICD-10-CM | POA: Diagnosis not present

## 2022-02-11 NOTE — Progress Notes (Signed)
I connected with  Jordan [name] on 02/11/22 by a audio enabled telemedicine application and verified that I am speaking with the correct person using two identifiers.  Patient Location: Home  Provider Location: Office/Clinic  I discussed the limitations of evaluation and management by telemedicine. The patient expressed understanding and agreed to proceed.  Subjective:   Jordan [name] is a 81 y.o. male who presents for Medicare Annual/Subsequent preventive examination.  Review of Systems    Per HPI unless specifically indicated below.  Cardiac Risk Factors include: advanced age (>19mn, >>59women);male gender, hypertension, CAD, and hyperlipidemia.           Objective:       12/30/2021    1:50 PM 08/28/2021    1:35 PM 08/27/2021    1:45 PM  Vitals with BMI  Height '5\' 9"'$  '5\' 9"'$  '5\' 9"'$   Weight 299 lbs 294 lbs 13 oz 296 lbs  BMI 44.13 [phone] Systolic [id number] Diastolic 74 76 70  Pulse 90 85 90    There were no vitals filed for this visit. There is no height or weight on file to calculate BMI.     05/18/2021   10:00 PM 05/14/2021   10:33 AM 05/10/2021   11:40 AM 02/15/2021   12:11 PM 02/05/2021   10:09 AM 12/01/2019    2:39 PM 04/20/2019    5:03 PM  Advanced Directives  Does Patient Have a Medical Advance Directive? No No No No No No No  Would patient like information on creating a medical advance directive? No - Patient declined No - Patient declined No - Patient declined No - Patient declined No - Patient declined Yes (MAU/Ambulatory/Procedural Areas - Information given) No - Patient declined    Current Medications (verified) Outpatient Encounter Medications as of 02/11/2022  Medication Sig   acetaminophen (TYLENOL) 325 MG tablet Take 2 tablets (650 mg total) by mouth every 4 (four) hours as needed for headache or mild pain.   Alcohol Swabs (B-D SINGLE USE SWABS REGULAR) PADS 1 each by Does not apply route as needed.   allopurinol (ZYLOPRIM) 300 MG  tablet Take 1 tablet (300 mg total) by mouth daily.   amLODipine (NORVASC) 2.5 MG tablet Take 1 tablet (2.5 mg total) by mouth daily. With the 5 mg dose   amLODipine (NORVASC) 5 MG tablet Take 1 tablet (5 mg total) by mouth daily. Take with the 2.5 mg tablet   aspirin EC 81 MG tablet Take 1 tablet (81 mg total) by mouth daily. Swallow whole.   atorvastatin (LIPITOR) 80 MG tablet Take 1 tablet (80 mg total) by mouth at bedtime.   Cholecalciferol (VITAMIN D3 PO) Take 1 tablet by mouth every Monday, Wednesday, and Friday.   ferrous sulfate 325 (65 FE) MG tablet Take 325 mg by mouth daily.   furosemide (LASIX) 20 MG tablet Take 1 tablet by mouth once daily   glipiZIDE (GLUCOTROL XL) 5 MG 24 hr tablet Take 1 tablet (5 mg total) by mouth daily with breakfast.   glucose blood (TRUE METRIX BLOOD GLUCOSE TEST) test strip Use as instructed   latanoprost (XALATAN) 0.005 % ophthalmic solution Place 1 drop into the left eye at bedtime.    metFORMIN (GLUCOPHAGE-XR) 750 MG 24 hr tablet Take 2 tablets (1,500 mg total) by mouth daily with breakfast.   Omega-3 Fatty Acids (FISH OIL PO) Take 1 capsule by mouth 2 (two) times daily.    pioglitazone (ACTOS) 15 MG  tablet Take 1 tablet (15 mg total) by mouth daily.   polyethylene glycol (MIRALAX / GLYCOLAX) packet Take 17 g by mouth at bedtime.    tadalafil (CIALIS) 10 MG tablet Take 1-2 tablets (10-20 mg total) by mouth every three (3) days as needed for erectile dysfunction.   tamsulosin (FLOMAX) 0.4 MG CAPS capsule Take 1 capsule (0.4 mg total) by mouth daily.   TRUEplus Lancets 33G MISC 1 Box by Does not apply route every 30 (thirty) days.   Blood Glucose Calibration (TRUE METRIX LEVEL 1) Low SOLN 1 Bottle by In Vitro route once for 1 dose.   Blood Glucose Monitoring Suppl (TRUE METRIX AIR GLUCOSE METER) DEVI 1 each by Does not apply route once for 1 dose.   No facility-administered encounter medications on file as of 02/11/2022.    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 Burt Knack and Dr. Cyndia Bent   Unspecified constipation    Past Surgical History:  Procedure Laterality Date   cataract surgery     COLONOSCOPY  03/17/2010   COLONOSCOPY N/A 04/22/2019   Procedure: COLONOSCOPY;  Surgeon: Irving Copas., MD;  Location: [facility];  Service: Gastroenterology;  Laterality: N/A;   EYE SURGERY  03/17/2009   cataract   HERNIA REPAIR     INTRAOPERATIVE TRANSTHORACIC ECHOCARDIOGRAM N/A 05/14/2021   Procedure: INTRAOPERATIVE TRANSTHORACIC ECHOCARDIOGRAM;  Surgeon: Sherren Mocha, MD;  Location: [facility];  Service: Open Heart Surgery;  Laterality: N/A;   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   Procedure: PACEMAKER IMPLANT;  Surgeon: Evans Lance, MD;  Location: [facility];  Service: Cardiovascular;  Laterality: N/A;   RIGHT HEART CATH AND CORONARY ANGIOGRAPHY Bilateral 02/15/2021   Procedure: RIGHT HEART CATH AND CORONARY ANGIOGRAPHY;  Surgeon: Nelva Bush, MD;  Location: [facility];  Service: Cardiovascular;  Laterality: Bilateral;   TRANSCATHETER AORTIC VALVE REPLACEMENT, TRANSFEMORAL N/A 05/14/2021   Procedure: Transcatheter Aortic Valve Replacement, Transfemoral;  Surgeon: Sherren Mocha, MD;  Location: [facility];  Service: Open Heart Surgery;  Laterality: N/A;   UMBILICAL HERNIA REPAIR  03/17/2009   Family History  Problem Relation Age of Onset   Heart disease  Father    Hypertension Father    Diabetes Sister    Diabetes Brother    Prostate cancer Brother    Diabetes Sister    Kidney disease Neg Hx    Kidney cancer Neg Hx    Bladder Cancer Neg Hx    Colon cancer Neg Hx    Esophageal cancer Neg Hx    Inflammatory bowel disease Neg Hx    Liver disease Neg Hx    Pancreatic cancer Neg Hx    Rectal cancer Neg Hx    Stomach cancer Neg Hx    Social History   Socioeconomic History   Marital status: Widowed    Spouse name:  Not on file   Number of children: 2   Years of education: Not on file   Highest education level: Some college, no degree  Occupational History   Occupation: retired     Comment: used to [employer]   Tobacco Use   Smoking status: Former    Packs/day: 1.00    Years: 10.00    Total pack years: 10.00    Types: Cigarettes    Start date: 03/17/1965    Quit date: 11/27/1975    Years since quitting: 46.2   Smokeless tobacco: Never   Tobacco comments:    quit 40 years  Vaping Use   Vaping Use: Never used  Substance and Sexual Activity   Alcohol use: Yes    Alcohol/week: 1.0 standard drink of alcohol    Types: 1 Standard drinks or equivalent per week    Comment: socially - 1 x year   Drug use: No   Sexual activity: Yes    Partners: Female  Other Topics Concern   Not on file  Social History Narrative   Lives by himself in [place]   Two grown children, daughter in [place] and son in [place]   He goes to [place] to visit his sisters often    Social Determinants of Health   Financial Resource Strain: Low Risk  (02/11/2022)   Overall Financial Resource Strain (CARDIA)    Difficulty of Paying Living Expenses: Not hard at all  Food Insecurity: No Food Insecurity (02/11/2022)   Hunger Vital Sign    Worried About [place] in the Last Year: Never true    [place] in the Last Year: Never true  Transportation Needs: No Transportation Needs (02/11/2022)   PRAPARE - Civil engineer, contracting (Medical): No    Lack of Transportation (Non-Medical): No  Physical Activity: Sufficiently Active (02/11/2022)   Exercise Vital Sign    Days of Exercise per Week: 5 days    Minutes of Exercise per Session: 30 min  Stress: No Stress Concern Present (02/11/2022)   [place]    Feeling of Stress : Not at all  Social Connections: Moderately Integrated (02/11/2022)   Social Connection and Isolation Panel [NHANES]    Frequency of Communication with Friends and Family: More than three times a week    Frequency of Social Gatherings with Friends and Family: More than three times a week    Attends Religious Services: More than 4 times per year    Active Member of [employer] or Organizations: Yes    Attends Archivist Meetings: More than 4 times per year    Marital Status: Widowed    Tobacco Counseling Counseling given: No Tobacco comments: quit 40 years   Clinical Intake:  Pre-visit preparation completed: No  Pain : No/denies pain     Nutritional Status: BMI > 30  Obese Nutritional Risks: None Diabetes: Yes CBG done?: Yes CBG resulted in Enter/ Edit results?: No Did pt. bring in CBG monitor from home?: No  How often do you need to have someone help you when you read instructions, pamphlets, or other written materials from your doctor or pharmacy?: 1 - Never  Diabetic?Nutrition Risk Assessment:  Has the patient had any N/V/D within the last 2 months?  No  Does the patient have any non-healing wounds?  No  Has the patient had any unintentional weight loss or weight gain?  No   Diabetes:  Is the patient diabetic?  Yes  If diabetic, was a CBG obtained today?  No  Did the patient bring in their glucometer from home?  No  How often do you monitor your CBG's? Twice daily    Financial Strains and Diabetes Management:  Are you having any financial strains with the device, your supplies or  your medication? No .  Does the patient want to be seen by Chronic Care Management for management of their diabetes?  No  Would the patient like to be referred to a Nutritionist or for Diabetic Management?  No   Diabetic Exams:  Diabetic Eye Exam: Completed 04/12/2021 Diabetic Foot Exam: Completed 01/16/2022    Interpreter Needed?: No  Information entered by :: Donnie Mesa, [place]   Activities of Daily Living    02/11/2022    9:44 AM 12/30/2021    1:50 PM  In your present state of health, do you have any difficulty performing the following activities:  Hearing? 1 1  Vision? 1 0  Difficulty concentrating or making decisions? 1 0  Walking or climbing stairs? 1 1  Dressing or bathing? 0 0  Doing errands, shopping? 0 0    Patient Care Team: Steele Sizer, MD as PCP - General (Family Medicine) Minna Merritts, MD as PCP - Cardiology (Cardiology) Vickie Epley, MD as PCP - Electrophysiology (Cardiology) Nori Riis PA-C as Physician Assistant (Urology) Minna Merritts, MD as Consulting Physician (Cardiology) Lucky Cowboy Erskine Squibb, MD as Referring Physician (Vascular Surgery) Gardiner Barefoot, DPM as Consulting Physician (Podiatry)  Indicate any recent Medical Services you may have received from other than [facility] providers in the past year (date may be approximate). 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     Assessment:   This is a routine wellness examination for Jordan [name].  Hearing/Vision screen Admits to some hearing loss that he associates with age. Annual Eye Exam, [facility]. Wear glasses.  Dietary issues and exercise activities discussed: Current Exercise Habits: Structured exercise class, Type of exercise: walking, Time (Minutes): 30, Frequency (Times/Week): 5, Weekly Exercise (Minutes/Week): 150, Intensity: Moderate   Goals Addressed             This Visit's Progress    Stay Active and Independent        Why is this important?   Regular activity or exercise is important to managing back pain.  Activity helps to keep your muscles strong.  You will sleep better and feel more relaxed.  You will have more energy and feel less stressed.  If you are not active now, start slowly. Little changes make a big difference.  Rest, but not too much.  Stay as active as you can and listen to your body's signals.            Depression Screen    02/11/2022    9:44 AM 12/30/2021    1:50 PM 08/27/2021    1:44 PM 04/17/2021   11:02 AM 02/05/2021   10:08 AM 01/16/2021   10:44 AM 08/15/2020    2:42 PM  PHQ 2/9 Scores  PHQ - 2 Score 0 0 0 0 0 0 3  PHQ- 9 Score  0 0 0 '3 3 4    '$ Fall Risk    02/11/2022    9:44 AM 12/30/2021    1:50 PM 08/27/2021    1:44 PM 04/17/2021   11:02 AM 02/05/2021   10:10 AM  Fall Risk  Falls in the past year? 0 0 0 0 0  Number falls in past yr: 0 0 0 0 0  Injury with Fall? 0 0 0 0 0  Risk for fall due to : No Fall Risks No Fall Risks No Fall Risks;Impaired balance/gait No Fall Risks No Fall Risks  Follow up Falls evaluation completed Falls prevention discussed Falls prevention discussed Falls prevention discussed Falls prevention discussed    FALL RISK PREVENTION PERTAINING TO THE HOME:  Any stairs in or around the home? No  If so, are there any without handrails? No  Home free of loose throw rugs in walkways, pet beds, electrical cords, etc? Yes  Adequate lighting in your home to reduce risk of falls? Yes   ASSISTIVE DEVICES UTILIZED TO PREVENT FALLS:  Life alert? No  Use of a cane, walker or w/c? Yes  Grab bars in the bathroom? Yes  Shower chair or bench in shower? No  Elevated toilet seat or a handicapped toilet? No   TIMED UP AND GO:  Was the test performed? No , virtual appt    Cognitive Function:        02/11/2022    9:45 AM 11/30/2018   12:11 PM  6CIT Screen  What Year? 0 points 0 points  What month? 0 points 0 points  What time? 0 points 0  points  Count back from 20 0 points 0 points  Months in reverse 0 points 0 points  Repeat phrase 4 points 6 points  Total Score 4 points 6 points    Immunizations Immunization History  Administered Date(s) Administered   Fluad Quad(high Dose 65+) 12/23/2019, 01/16/2021, 12/30/2021   Influenza, High Dose Seasonal PF 11/27/2015, 11/19/2016, 11/25/2017   Influenza,inj,Quad PF,6+ Mos 11/09/2014, 11/30/2018   PFIZER(Purple Top)SARS-COV-2 Vaccination 04/07/2019, 05/16/2019, 12/16/2019   Pneumococcal Conjugate-13 11/19/2016   Pneumococcal Polysaccharide-23 11/30/2018   Tdap 02/18/2017    TDAP status: Up to date  Flu Vaccine status: Up to date  Pneumococcal vaccine status: Up to date  Covid-19 vaccine status: Information provided on how to obtain vaccines.   Qualifies for Shingles Vaccine? Yes   Zostavax completed No   Shingrix Completed?: No.    Education has been provided regarding the importance of this vaccine. Patient has been advised to call insurance company to determine out of pocket expense if they have not yet received this vaccine. Advised may also receive vaccine at local pharmacy or Health Dept. Verbalized acceptance and understanding.  Screening Tests Health Maintenance  Topic Date Due   Zoster Vaccines- Shingrix (1 of 2) Never done   COVID-19 Vaccine (4 - 2023-24 season) 11/15/2021   OPHTHALMOLOGY EXAM  04/12/2022   HEMOGLOBIN A1C  07/01/2022   Diabetic kidney evaluation - GFR measurement  12/31/2022   Diabetic kidney evaluation - Urine ACR  12/31/2022   FOOT EXAM  01/17/2023   Medicare Annual Wellness (AWV)  02/12/2023   Pneumonia Vaccine 71+ Years old  Completed   INFLUENZA VACCINE  Completed   HPV VACCINES  Aged Out    Health Maintenance  Health Maintenance Due  Topic Date Due   Zoster Vaccines- Shingrix (1 of 2) Never done   COVID-19 Vaccine (4 - 2023-24 season) 11/15/2021    Colorectal cancer screening: No longer required.   Lung Cancer Screening:  (Low Dose CT Chest recommended if Age 68-80 years, 30 pack-year currently smoking OR have quit w/in 15years.) does not qualify.     Additional Screening:  Hepatitis C Screening: does not qualify,  Postponed  Vision Screening: Recommended annual ophthalmology exams for early detection of glaucoma and other disorders of the eye. Is the patient up to date with their annual eye exam?  Yes  Who is the provider or what is the name of the office in which the patient attends annual eye exams? [facility] If pt is not established with a provider, would they like to be referred to a provider to establish care? No .   Dental Screening: Recommended annual dental exams for proper oral hygiene  Community Resource Referral / Chronic Care Management: CRR required this visit?  No   CCM required this visit?  No      Plan:     I have personally reviewed and noted the following in the patient's chart:   Medical and social history Use of alcohol, tobacco or illicit drugs  Current medications and supplements including opioid prescriptions. Patient is not currently taking opioid prescriptions. Functional ability and status Nutritional status Physical activity Advanced directives List of other physicians Hospitalizations, surgeries, and ER visits in previous 12 months Vitals Screenings to include cognitive, depression, and falls Referrals and appointments  In addition, I have reviewed and discussed with patient certain preventive protocols, quality metrics, and best practice recommendations. A written personalized care plan for preventive services as well as general preventive health recommendations were provided to patient.    Jordan [name] , Thank you for taking time to come for your Medicare Wellness Visit. I appreciate your ongoing commitment to your health goals. Please review the following plan we discussed and let me know if I can assist you in the future.   These are the goals we  discussed:  Goals      Patient Stated     Lower A1c to less than 7.0     Stay Active and Independent     Why is this important?   Regular activity or exercise is important to managing back pain.  Activity helps to keep your muscles strong.  You will sleep better and feel more relaxed.  You will have more energy and feel less stressed.  If you are not active now, start slowly. Little changes make a big difference.  Rest, but not too much.  Stay as active as you can and listen to your body's signals.             This is a list of the screening recommended for you and due dates:  Health Maintenance  Topic Date Due   Zoster (Shingles) Vaccine (1 of 2) Never done   COVID-19 Vaccine (4 - 2023-24 season) 11/15/2021   Eye exam for diabetics  04/12/2022   Hemoglobin A1C  07/01/2022   Yearly kidney function blood test for diabetes  12/31/2022   Yearly kidney health urinalysis for diabetes  12/31/2022   Complete foot exam   01/17/2023   Medicare Annual Wellness Visit  02/12/2023   Pneumonia Vaccine  Completed   Flu Shot  Completed   HPV Vaccine  Aged [address], [place]   02/11/2022   Nurse Notes: Approximately 30 minute Non-Face -To-Face Medicare Wellness Visit

## 2022-02-11 NOTE — Patient Instructions (Signed)
Health Maintenance, Male Adopting a healthy lifestyle and getting preventive care are important in promoting health and wellness. Ask your health care provider about: The right schedule for you to have regular tests and exams. Things you can do on your own to prevent diseases and keep yourself healthy. What should I know about diet, weight, and exercise? Eat a healthy diet  Eat a diet that includes plenty of vegetables, fruits, low-fat dairy products, and lean protein. Do not eat a lot of foods that are high in solid fats, added sugars, or sodium. Maintain a healthy weight Body mass index (BMI) is a measurement that can be used to identify possible weight problems. It estimates body fat based on height and weight. Your health care provider can help determine your BMI and help you achieve or maintain a healthy weight. Get regular exercise Get regular exercise. This is one of the most important things you can do for your health. Most adults should: Exercise for at least 150 minutes each week. The exercise should increase your heart rate and make you sweat (moderate-intensity exercise). Do strengthening exercises at least twice a week. This is in addition to the moderate-intensity exercise. Spend less time sitting. Even light physical activity can be beneficial. Watch cholesterol and blood lipids Have your blood tested for lipids and cholesterol at 80 years of age, then have this test every 5 years. You may need to have your cholesterol levels checked more often if: Your lipid or cholesterol levels are high. You are older than 80 years of age. You are at high risk for heart disease. What should I know about cancer screening? Many types of cancers can be detected early and may often be prevented. Depending on your health history and family history, you may need to have cancer screening at various ages. This may include screening for: Colorectal cancer. Prostate cancer. Skin cancer. Lung  cancer. What should I know about heart disease, diabetes, and high blood pressure? Blood pressure and heart disease High blood pressure causes heart disease and increases the risk of stroke. This is more likely to develop in people who have high blood pressure readings or are overweight. Talk with your health care provider about your target blood pressure readings. Have your blood pressure checked: Every 3-5 years if you are 18-39 years of age. Every year if you are 40 years old or older. If you are between the ages of 65 and 75 and are a current or former smoker, ask your health care provider if you should have a one-time screening for abdominal aortic aneurysm (AAA). Diabetes Have regular diabetes screenings. This checks your fasting blood sugar level. Have the screening done: Once every three years after age 45 if you are at a normal weight and have a low risk for diabetes. More often and at a younger age if you are overweight or have a high risk for diabetes. What should I know about preventing infection? Hepatitis B If you have a higher risk for hepatitis B, you should be screened for this virus. Talk with your health care provider to find out if you are at risk for hepatitis B infection. Hepatitis C Blood testing is recommended for: Everyone born from 1945 through 1965. Anyone with known risk factors for hepatitis C. Sexually transmitted infections (STIs) You should be screened each year for STIs, including gonorrhea and chlamydia, if: You are sexually active and are younger than 80 years of age. You are older than 80 years of age and your   health care provider tells you that you are at risk for this type of infection. Your sexual activity has changed since you were last screened, and you are at increased risk for chlamydia or gonorrhea. Ask your health care provider if you are at risk. Ask your health care provider about whether you are at high risk for HIV. Your health care provider  may recommend a prescription medicine to help prevent HIV infection. If you choose to take medicine to prevent HIV, you should first get tested for HIV. You should then be tested every 3 months for as long as you are taking the medicine. Follow these instructions at home: Alcohol use Do not drink alcohol if your health care provider tells you not to drink. If you drink alcohol: Limit how much you have to 0-2 drinks a day. Know how much alcohol is in your drink. In the [place] one drink equals one 12 oz bottle of beer (355 mL), one 5 oz glass of wine (148 mL), or one 1 oz glass of hard liquor (44 mL). Lifestyle Do not use any products that contain nicotine or tobacco. These products include cigarettes, chewing tobacco, and vaping devices, such as e-cigarettes. If you need help quitting, ask your health care provider. Do not use street drugs. Do not share needles. Ask your health care provider for help if you need support or information about quitting drugs. General instructions Schedule regular health, dental, and eye exams. Stay current with your vaccines. Tell your health care provider if: You often feel depressed. You have ever been abused or do not feel safe at home. Summary Adopting a healthy lifestyle and getting preventive care are important in promoting health and wellness. Follow your health care provider's instructions about healthy diet, exercising, and getting tested or screened for diseases. Follow your health care provider's instructions on monitoring your cholesterol and blood pressure. This information is not intended to replace advice given to you by your health care provider. Make sure you discuss any questions you have with your health care provider. Document Revised: 07/23/2020 Document Reviewed: 07/23/2020 Elsevier Patient Education  2023 [employer]  

## 2022-02-17 ENCOUNTER — Encounter: Payer: Self-pay | Admitting: Podiatry

## 2022-02-19 ENCOUNTER — Ambulatory Visit (INDEPENDENT_AMBULATORY_CARE_PROVIDER_SITE_OTHER): Payer: Medicare HMO | Admitting: Nurse Practitioner

## 2022-02-19 ENCOUNTER — Encounter (INDEPENDENT_AMBULATORY_CARE_PROVIDER_SITE_OTHER): Payer: Medicare HMO

## 2022-02-19 ENCOUNTER — Ambulatory Visit (INDEPENDENT_AMBULATORY_CARE_PROVIDER_SITE_OTHER): Payer: Medicare HMO

## 2022-02-19 DIAGNOSIS — I442 Atrioventricular block, complete: Secondary | ICD-10-CM | POA: Diagnosis not present

## 2022-02-25 LAB — CUP PACEART REMOTE DEVICE CHECK
Battery Remaining Longevity: 175 mo
Battery Voltage: 3.14 V
Brady Statistic AP VP Percent: 0.01 %
Brady Statistic AP VS Percent: 2.69 %
Brady Statistic AS VP Percent: 0.03 %
Brady Statistic AS VS Percent: 97.27 %
Brady Statistic RA Percent Paced: 2.71 %
Brady Statistic RV Percent Paced: 0.04 %
Date Time Interrogation Session: 20231211193643
Implantable Lead Connection Status: 753985
Implantable Lead Connection Status: 753985
Implantable Lead Implant Date: 20230307
Implantable Lead Implant Date: 20230307
Implantable Lead Location: 753859
Implantable Lead Location: 753860
Implantable Lead Model: 3830
Implantable Lead Model: 5076
Implantable Pulse Generator Implant Date: 20230307
Lead Channel Impedance Value: 342 Ohm
Lead Channel Impedance Value: 399 Ohm
Lead Channel Impedance Value: 399 Ohm
Lead Channel Impedance Value: 608 Ohm
Lead Channel Pacing Threshold Amplitude: 0.625 V
Lead Channel Pacing Threshold Amplitude: 0.75 V
Lead Channel Pacing Threshold Pulse Width: 0.4 ms
Lead Channel Pacing Threshold Pulse Width: 0.4 ms
Lead Channel Sensing Intrinsic Amplitude: 18 mV
Lead Channel Sensing Intrinsic Amplitude: 18 mV
Lead Channel Sensing Intrinsic Amplitude: 4.75 mV
Lead Channel Sensing Intrinsic Amplitude: 4.75 mV
Lead Channel Setting Pacing Amplitude: 1.5 V
Lead Channel Setting Pacing Amplitude: 2 V
Lead Channel Setting Pacing Pulse Width: 0.4 ms
Lead Channel Setting Sensing Sensitivity: 1.2 mV
Zone Setting Status: 755011
Zone Setting Status: 755011

## 2022-02-28 ENCOUNTER — Other Ambulatory Visit: Payer: Self-pay | Admitting: Family Medicine

## 2022-02-28 NOTE — Telephone Encounter (Signed)
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 [facility])   [facility] Steele Sizer, MD   10 months ago Ascending aorta dilation [facility])   [facility] [place], Drue Stager, MD   1 year ago Type 2 diabetes mellitus with microalbuminuria, without long-term current use of insulin [facility])   [facility] [place], Drue Stager, MD   1 year ago Type 2 diabetes mellitus with microalbuminuria, without long-term current use of insulin [facility])   [facility] Steele Sizer, MD       Future Appointments             In 2 months  [place]. [facility], [facility]   In 2 months Ancil Boozer, Drue Stager, MD [facility] [place] test strip [place] Med Name: ACCU-CHEK AVIVA PLUS   Strip] 200 strip 3    Sig: TEST TWO TIMES DAILY AS NEEDED AS DIRECTED     Endocrinology: Diabetes - Testing Supplies Passed - 02/28/2022  5:21 PM      Passed - Valid encounter within last 12 months    Recent Outpatient Visits           2 months ago Controlled type 2 diabetes mellitus with microalbuminuria, without long-term current use of insulin [facility])   [facility] [place], Drue Stager, MD   6 months ago 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 [facility])   [facility] Steele Sizer, MD   10 months ago Ascending aorta dilation [facility])   [facility] [place], Drue Stager, MD   1 year ago Type 2 diabetes mellitus with microalbuminuria, without long-term current use of insulin [facility])   [facility] [place], Drue Stager, MD   1 year ago Type 2 diabetes mellitus with microalbuminuria, without long-term current use of insulin [facility])   [facility] Steele Sizer, MD       Future Appointments             In 2 months  [place]. [facility], [facility]   In 2 months Ancil Boozer, Drue Stager, MD [facility], [facility]

## 2022-03-14 NOTE — Progress Notes (Signed)
Remote pacemaker transmission.   

## 2022-03-20 ENCOUNTER — Other Ambulatory Visit: Payer: Self-pay | Admitting: Family Medicine

## 2022-03-20 DIAGNOSIS — I700 Atherosclerosis of aorta: Secondary | ICD-10-CM

## 2022-03-20 DIAGNOSIS — M109 Gout, unspecified: Secondary | ICD-10-CM

## 2022-03-20 DIAGNOSIS — E782 Mixed hyperlipidemia: Secondary | ICD-10-CM

## 2022-03-20 DIAGNOSIS — N401 Enlarged prostate with lower urinary tract symptoms: Secondary | ICD-10-CM

## 2022-03-20 DIAGNOSIS — R809 Proteinuria, unspecified: Secondary | ICD-10-CM

## 2022-03-21 NOTE — Telephone Encounter (Signed)
Requested Prescriptions  Pending Prescriptions Disp Refills   metFORMIN (GLUCOPHAGE-XR) 750 MG 24 hr tablet [Pharmacy Med Name: METFORMIN HYDROCHLORIDE ER 750 MG Tablet Extended Release 24 Hour] 180 tablet 3    Sig: TAKE 2 TABLETS (1,500 MG TOTAL) BY MOUTH DAILY WITH BREAKFAST.     Endocrinology:  Diabetes - Biguanides Failed - 03/20/2022  6:36 PM      Failed - HBA1C is between 0 and 7.9 and within 180 days    Hemoglobin A1C  Date Value Ref Range Status  12/30/2021 8.1 (A) 4.0 - 5.6 % Final   HbA1c, POC (prediabetic range)  Date Value Ref Range Status  03/29/2018 6.3 5.7 - 6.4 % Final   HbA1c, POC (controlled diabetic range)  Date Value Ref Range Status  04/01/2019 7.6 (A) 0.0 - 7.0 % Final   Hgb A1c MFr Bld  Date Value Ref Range Status  04/17/2021 7.9 (H) <5.7 % of total Hgb Final    Comment:    For someone without known diabetes, a hemoglobin A1c value of 6.5% or greater indicates that they may have  diabetes and this should be confirmed with a follow-up  test. . For someone with known diabetes, a value <7% indicates  that their diabetes is well controlled and a value  greater than or equal to 7% indicates suboptimal  control. A1c targets should be individualized based on  duration of diabetes, age, comorbid conditions, and  other considerations. . Currently, no consensus exists regarding use of hemoglobin A1c for diagnosis of diabetes for children. .          Failed - B12 Level in normal range and within 720 days    Vitamin B-12  Date Value Ref Range Status  11/25/2017 728 200 - 1,100 pg/mL Final         Passed - Cr in normal range and within 360 days    Creat  Date Value Ref Range Status  12/30/2021 0.97 0.70 - 1.22 mg/dL Final   Creatinine, Urine  Date Value Ref Range Status  12/30/2021 49 20 - 320 mg/dL Final         Passed - eGFR in normal range and within 360 days    GFR, Est African American  Date Value Ref Range Status  12/23/2019 92 > OR = 60  mL/min/1.60m Final   GFR, Est Non African American  Date Value Ref Range Status  12/23/2019 79 > OR = 60 mL/min/1.738mFinal   GFR, Estimated  Date Value Ref Range Status  05/20/2021 >60 >60 mL/min Final    Comment:    (NOTE) Calculated using the CKD-EPI Creatinine Equation (2021)    eGFR  Date Value Ref Range Status  12/30/2021 79 > OR = 60 mL/min/1.7333minal  03/22/2021 75 >59 mL/min/1.73 Final         Passed - Valid encounter within last 6 months    Recent Outpatient Visits           2 months ago Controlled type 2 diabetes mellitus with microalbuminuria, without long-term current use of insulin ([facility]   6 months ago 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   11 months ago 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[facility]   1 year ago Type 2 diabetes mellitus with microalbuminuria, without long-term current use of insulin [facility]  Steele Sizer, MD   1 year ago Type 2 diabetes mellitus with microalbuminuria, without long-term current use of insulin [facility])   [facility] Steele Sizer, MD       Future Appointments             In 1 month  [place]. [facility], [facility]   In 2 months Ancil Boozer, Drue Stager, MD [facility] [place] within normal limits and completed in the last 12 months    WBC  Date Value Ref Range Status  12/30/2021 4.8 3.8 - 10.8 Thousand/uL Final   RBC  Date Value Ref Range Status  12/30/2021 4.66 4.20 - 5.80 Million/uL Final   Hemoglobin  Date Value Ref Range Status  12/30/2021 13.8 13.2 - 17.1 g/dL Final  02/12/2021 10.7 (L) 13.0 - 17.7 g/dL Final   HCT  Date Value Ref Range Status  12/30/2021 41.1 38.5 - 50.0 % Final    Hematocrit  Date Value Ref Range Status  02/12/2021 33.9 (L) 37.5 - 51.0 % Final   MCHC  Date Value Ref Range Status  12/30/2021 33.6 32.0 - 36.0 g/dL Final   [facility]  Date Value Ref Range Status  12/30/2021 29.6 27.0 - 33.0 pg Final   MCV  Date Value Ref Range Status  12/30/2021 88.2 80.0 - 100.0 fL Final  02/12/2021 87 79 - 97 fL Final   No results found for: "PLTCOUNTKUC", "LABPLAT", "POCPLA" RDW  Date Value Ref Range Status  12/30/2021 15.2 (H) 11.0 - 15.0 % Final  02/12/2021 15.2 11.6 - 15.4 % Final          tamsulosin (FLOMAX) 0.4 MG CAPS capsule [Pharmacy Med Name: TAMSULOSIN HYDROCHLORIDE 0.4 MG Capsule] 90 capsule 3    Sig: TAKE 1 CAPSULE (0.4 MG TOTAL) BY MOUTH DAILY.     Urology: Alpha-Adrenergic Blocker Failed - 03/20/2022  6:36 PM      Failed - PSA in normal range and within 360 days    PSA  Date Value Ref Range Status  12/23/2019 1.68 < OR = 4.0 ng/mL Final    Comment:    The total PSA value from this assay system is  standardized against the WHO standard. The test  result will be approximately 20% lower when compared  to the equimolar-standardized total PSA (Beckman  Coulter). Comparison of serial PSA results should be  interpreted with this fact in mind. . This test was performed using the Siemens  chemiluminescent method. Values obtained from  different assay methods cannot be used interchangeably. PSA levels, regardless of value, should not be interpreted as absolute evidence of the presence or absence of disease.    Prostate Specific Ag, Serum  Date Value Ref Range Status  09/21/2014 2.9 0.0 - 4.0 ng/mL Final    Comment:    Roche ECLIA methodology. According to the American Urological Association, Serum PSA should decrease and remain at undetectable levels after radical prostatectomy. The AUA defines biochemical recurrence as an initial PSA value 0.2 ng/mL or greater followed by a subsequent confirmatory PSA value 0.2 ng/mL or  greater. Values obtained with different assay methods or kits cannot be used interchangeably. Results cannot be interpreted as absolute evidence of the presence or absence of malignant disease.          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 [facility])   [facility] Steele Sizer, MD   11 months ago Ascending aorta dilation [facility])   [facility] Steele Sizer, MD   1 year ago Type 2 diabetes mellitus with microalbuminuria, without long-term current use of insulin [facility])   [facility] [place], Drue Stager, MD   1 year ago Type 2 diabetes mellitus with microalbuminuria, without long-term current use of insulin [facility])   [facility] Steele Sizer, MD       Future Appointments             In 1 month  [place]. [facility], [facility]   In 2 months Ancil Boozer, Drue Stager, MD [facility], [facility]             atorvastatin (LIPITOR) 40 MG tablet [Pharmacy Med Name: ATORVASTATIN CALCIUM 40 MG Tablet] 90 tablet 3    Sig: TAKE 1 TABLET AT BEDTIME     Cardiovascular:  Antilipid - Statins Failed - 03/20/2022  6:36 PM      Failed - Lipid Panel in normal range within the last 12 months    Cholesterol, Total  Date Value Ref Range Status  08/27/2015 131 100 - 199 mg/dL Final   Cholesterol  Date Value Ref Range Status  12/30/2021 143 <200 mg/dL Final   LDL Cholesterol (Calc)  Date Value Ref Range Status  12/30/2021 81 mg/dL (calc) Final    Comment:    Reference  range: <100 . Desirable range <100 mg/dL for primary prevention;   <70 mg/dL for patients with CHD or diabetic patients  with > or = 2 CHD risk factors. Marland Kitchen LDL-C is now calculated using the Martin-Hopkins  calculation, which is a validated novel method providing  better accuracy than the Friedewald equation in the  estimation of LDL-C.  Cresenciano Genre et al. Annamaria Helling. 0623;762(83): 2061-2068  (http://education.QuestDiagnostics.com/faq/FAQ164)    HDL  Date Value Ref Range Status  12/30/2021 43 > OR = 40 mg/dL Final  08/27/2015 40 >39 mg/dL Final   Triglycerides  Date Value Ref Range Status  12/30/2021 96 <150 mg/dL Final         Passed - Patient is not pregnant      Passed - Valid encounter within last 12 months    Recent Outpatient Visits           2 months ago Controlled type 2 diabetes mellitus with microalbuminuria, without long-term current use of insulin [facility])   [facility] [place], Drue Stager, MD   6 months ago 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 [facility])   [facility] Steele Sizer, MD   11 months ago Ascending aorta dilation [facility])   [facility] [place], Drue Stager, MD   1 year ago Type 2 diabetes mellitus with microalbuminuria, without long-term current use of insulin [facility])   [facility] [place], Drue Stager, MD   1 year ago Type 2 diabetes mellitus with microalbuminuria, without long-term current use  of insulin [facility])   [facility] Steele Sizer, MD       Future Appointments             In 1 month  [place] [facility] [place]. [facility], [facility]   In 2 months Ancil Boozer, Drue Stager, MD [facility], [facility]             allopurinol (ZYLOPRIM) 300 MG tablet [Pharmacy Med Name: ALLOPURINOL 300 MG Tablet] 90 tablet 3    Sig: TAKE 1 TABLET EVERY DAY     Endocrinology:  Gout Agents - allopurinol Failed -  03/20/2022  6:36 PM      Failed - Uric Acid in normal range and within 360 days    Uric Acid, Serum  Date Value Ref Range Status  12/30/2021 3.6 (L) 4.0 - 8.0 mg/dL Final    Comment:    Therapeutic target for gout patients: <6.0 mg/dL .    Uric Acid  Date Value Ref Range Status  08/27/2015 5.1 3.7 - 8.6 mg/dL Final    Comment:               Therapeutic target for gout patients: <6.0         Passed - Cr in normal range and within 360 days    Creat  Date Value Ref Range Status  12/30/2021 0.97 0.70 - 1.22 mg/dL Final   Creatinine, Urine  Date Value Ref Range Status  12/30/2021 49 20 - 320 mg/dL Final         Passed - Valid encounter within last 12 months    Recent Outpatient Visits           2 months ago Controlled type 2 diabetes mellitus with microalbuminuria, without long-term current use of insulin [facility])   [facility] [place], Drue Stager, MD   6 months ago 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)   [facility] Steele Sizer, MD   11 months ago Ascending aorta dilation [facility])   [facility] [place], Drue Stager, MD   1 year ago Type 2 diabetes mellitus with microalbuminuria, without long-term current use of insulin [facility])   [facility] [place], Drue Stager, MD   1 year ago Type 2 diabetes mellitus with microalbuminuria, without long-term current use of insulin [facility])   [facility] Steele Sizer, MD       Future Appointments             In 1 month  [place]. [facility], [facility]   In 2 months Ancil Boozer, Drue Stager, MD [facility] [place] within normal limits and completed in the last 12 months    WBC  Date Value Ref Range Status  12/30/2021 4.8 3.8 - 10.8 Thousand/uL Final   RBC  Date Value Ref Range Status  12/30/2021 4.66 4.20 - 5.80 Million/uL Final    Hemoglobin  Date Value Ref Range Status  12/30/2021 13.8 13.2 - 17.1 g/dL Final  02/12/2021 10.7 (L) 13.0 - 17.7 g/dL Final   HCT  Date Value Ref Range Status  12/30/2021 41.1 38.5 - 50.0 % Final   Hematocrit  Date Value Ref Range Status  02/12/2021 33.9 (L) 37.5 - 51.0 % Final   MCHC  Date Value Ref Range Status  12/30/2021  33.6 32.0 - 36.0 g/dL Final   [facility]  Date Value Ref Range Status  12/30/2021 29.6 27.0 - 33.0 pg Final   MCV  Date Value Ref Range Status  12/30/2021 88.2 80.0 - 100.0 fL Final  02/12/2021 87 79 - 97 fL Final   No results found for: "PLTCOUNTKUC", "LABPLAT", "POCPLA" RDW  Date Value Ref Range Status  12/30/2021 15.2 (H) 11.0 - 15.0 % Final  02/12/2021 15.2 11.6 - 15.4 % Final          glipiZIDE (GLUCOTROL XL) 5 MG 24 hr tablet [Pharmacy Med Name: GLIPIZIDE ER 5 MG Tablet Extended Release 24 Hour] 90 tablet 3    Sig: TAKE 1 TABLET (5 MG TOTAL) BY MOUTH DAILY WITH BREAKFAST.     Endocrinology:  Diabetes - Sulfonylureas Failed - 03/20/2022  6:36 PM      Failed - HBA1C is between 0 and 7.9 and within 180 days    Hemoglobin A1C  Date Value Ref Range Status  12/30/2021 8.1 (A) 4.0 - 5.6 % Final   HbA1c, POC (prediabetic range)  Date Value Ref Range Status  03/29/2018 6.3 5.7 - 6.4 % Final   HbA1c, POC (controlled diabetic range)  Date Value Ref Range Status  04/01/2019 7.6 (A) 0.0 - 7.0 % Final   Hgb A1c MFr Bld  Date Value Ref Range Status  04/17/2021 7.9 (H) <5.7 % of total Hgb Final    Comment:    For someone without known diabetes, a hemoglobin A1c value of 6.5% or greater indicates that they may have  diabetes and this should be confirmed with a follow-up  test. . For someone with known diabetes, a value <7% indicates  that their diabetes is well controlled and a value  greater than or equal to 7% indicates suboptimal  control. A1c targets should be individualized based on  duration of diabetes, age, comorbid conditions, and   other considerations. . Currently, no consensus exists regarding use of hemoglobin A1c for diagnosis of diabetes for children. .          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 [facility])   [facility] Steele Sizer, MD   11 months ago Ascending aorta dilation [facility])   [facility] [place], Drue Stager, MD   1 year ago Type 2 diabetes mellitus with microalbuminuria, without long-term current use of insulin [facility])   [facility] [place], Drue Stager, MD   1 year ago Type 2 diabetes mellitus with microalbuminuria, without long-term current use of insulin [facility])   [facility] Steele Sizer, MD       Future Appointments             In 1 month  [place]. [facility], [facility]   In 2 months [place], [place],  MD [place]

## 2022-03-28 ENCOUNTER — Other Ambulatory Visit: Payer: Self-pay | Admitting: Family Medicine

## 2022-03-28 DIAGNOSIS — I700 Atherosclerosis of aorta: Secondary | ICD-10-CM

## 2022-03-28 DIAGNOSIS — E782 Mixed hyperlipidemia: Secondary | ICD-10-CM

## 2022-04-16 DIAGNOSIS — H401122 Primary open-angle glaucoma, left eye, moderate stage: Secondary | ICD-10-CM | POA: Diagnosis not present

## 2022-04-16 DIAGNOSIS — Z0100 Encounter for examination of eyes and vision without abnormal findings: Secondary | ICD-10-CM | POA: Diagnosis not present

## 2022-04-16 DIAGNOSIS — E119 Type 2 diabetes mellitus without complications: Secondary | ICD-10-CM | POA: Diagnosis not present

## 2022-04-16 LAB — HM DIABETES EYE EXAM

## 2022-04-24 ENCOUNTER — Ambulatory Visit: Payer: Medicare HMO | Admitting: Podiatry

## 2022-04-30 DIAGNOSIS — Z0100 Encounter for examination of eyes and vision without abnormal findings: Secondary | ICD-10-CM | POA: Diagnosis not present

## 2022-05-16 NOTE — Progress Notes (Unsigned)
[facility] [place]                                     Cardiology Office Note:    Date:  05/19/2022   ID:  Jordan [name], DOB 1940-04-06, MRN [id number]  PCP:  Steele Sizer, MD  [facility] Cardiologist:  Ida Rogue, MD  [facility] Electrophysiologist:  Vickie Epley, MD   Referring MD: Steele Sizer, MD   Chief Complaint  Patient presents with   Follow-up    1 year s/p TAVR   History of Present Illness:    Jordan [name] is a 82 y.o. 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   He is followed by Dr. Rockey Situ for his cardiology care. Echocardiogram from 2014 showed normal LVEF with mild aortic stenosis. He had many repeat studies over the course of several years that showed slow progression. Repeat echocardiogram 02/2021 showed an EF at 50-55% with AVA by VTI 1.37cm2, mean gradient 52mHg, peak 54.520mg. He was then referred to Dr. CoBurt Knack2/30/22 for the evaluation of severe, symptomatic aortic stenosis. At that time, he reported progressive exertional dyspnea with low-level activities. He was previously able to walk an hour per day for exercise.  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 Otherwise, there was mild-moderate, non-obstructive coronary artery disease. Plan for for continued medical management of his CAD including aggressive secondary prevention.    He was evaluated by the multidisciplinary valve team and underwent successful TAVR with a 2967mdwards Sapien 3 THV via the TF approach on 05/14/21. Post operative echo showed EF 60%, normally functioning TAVR with a mean gradient of 4 mmHg and no PVL. 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 TayLovena Le  He has been doing well since his TAVR. Today he is here alone and reports that he continues [place]x [place] issues. He denies CP or SOB. No LE edema, orthopnea or PND. No dizziness or syncope. No blood in stool or urine. No palpitations.    Past Medical History:  Diagnosis Date   Aortic stenosis    Chronic kidney disease, stage II (mild)    Decreased libido    Diabetes mellitus without complication ([facility]  Glaucoma    Gout 2009   Heart murmur    Hernia [id number]    Hypertension    Inguinal hernia without mention of obstruction or gangrene, unilateral or unspecified, (not specified as recurrent)    Lumbago    Obesity    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   s/p TAVR with a 29 mm Edwards S3UR via the TF approach by Dr. CooBurt Knackd Dr. BarCyndia BentUnspecified constipation     Past Surgical History:  Procedure Laterality Date   cataract surgery     COLONOSCOPY  03/17/2010   COLONOSCOPY N/A 04/22/2019   Procedure: COLONOSCOPY;  Surgeon: ManIrving CopasMD;  Location: [facility];  Service:  Gastroenterology;  Laterality: N/A;   EYE SURGERY  03/17/2009   cataract   HERNIA REPAIR     INTRAOPERATIVE TRANSTHORACIC ECHOCARDIOGRAM N/A 05/14/2021   Procedure: INTRAOPERATIVE TRANSTHORACIC ECHOCARDIOGRAM;  Surgeon: Sherren Mocha, MD;  Location: [place] [facility];  Service: Open Heart Surgery;  Laterality: N/A;   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   Procedure: PACEMAKER IMPLANT;  Surgeon: Evans Lance, MD;  Location: [place] [facility];  Service: Cardiovascular;  Laterality: N/A;   RIGHT HEART CATH AND CORONARY ANGIOGRAPHY Bilateral 02/15/2021   Procedure: RIGHT HEART CATH AND CORONARY ANGIOGRAPHY;  Surgeon: Nelva Bush, MD;  Location: [place] [facility];  Service:  Cardiovascular;  Laterality: Bilateral;   TRANSCATHETER AORTIC VALVE REPLACEMENT, TRANSFEMORAL N/A 05/14/2021   Procedure: Transcatheter Aortic Valve Replacement, Transfemoral;  Surgeon: Sherren Mocha, MD;  Location: [place] [facility];  Service: Open Heart Surgery;  Laterality: N/A;   UMBILICAL HERNIA REPAIR  03/17/2009    Current Medications: Current Meds  Medication Sig   ACCU-CHEK AVIVA PLUS test strip TEST TWO TIMES DAILY AS NEEDED AS DIRECTED   Accu-Chek Softclix Lancets lancets TEST BLOOD SUGAR TWICE DAILY AS DIRECTED   acetaminophen (TYLENOL) 325 MG tablet Take 2 tablets (650 mg total) by mouth every 4 (four) hours as needed for headache or mild pain.   Alcohol Swabs (B-D SINGLE USE SWABS REGULAR) PADS 1 each by Does not apply route as needed.   allopurinol (ZYLOPRIM) 300 MG tablet TAKE 1 TABLET EVERY DAY   amLODipine (NORVASC) 2.5 MG tablet Take 1 tablet (2.5 mg total) by mouth daily. With the 5 mg dose   amLODipine (NORVASC) 5 MG tablet Take 1 tablet (5 mg total) by mouth daily. Take with the 2.5 mg tablet   aspirin EC 81 MG tablet Take 1 tablet (81 mg total) by mouth daily. Swallow whole.   atorvastatin (LIPITOR) 80 MG tablet TAKE 1 TABLET AT BEDTIME.   Cholecalciferol (VITAMIN D3 PO) Take 1 tablet by mouth every Monday, Wednesday, and Friday.   ferrous sulfate 325 (65 FE) MG tablet Take 325 mg by mouth daily.   furosemide (LASIX) 20 MG tablet Take 1 tablet by mouth once daily   glipiZIDE (GLUCOTROL XL) 5 MG 24 hr tablet Take 1 tablet (5 mg total) by mouth daily with breakfast.   latanoprost (XALATAN) 0.005 % ophthalmic solution Place 1 drop into the left eye at bedtime.    metFORMIN (GLUCOPHAGE-XR) 750 MG 24 hr tablet Take 2 tablets (1,500 mg total) by mouth daily with breakfast.   Omega-3 Fatty Acids (FISH OIL PO) Take 1 capsule by mouth 2 (two) times daily.    pioglitazone (ACTOS) 15 MG tablet Take 1 tablet (15 mg total) by mouth daily.   polyethylene glycol (MIRALAX /  GLYCOLAX) packet Take 17 g by mouth at bedtime.    tadalafil (CIALIS) 10 MG tablet Take 1-2 tablets (10-20 mg total) by mouth every three (3) days as needed for erectile dysfunction.   tamsulosin (FLOMAX) 0.4 MG CAPS capsule Take 1 capsule (0.4 mg total) by mouth daily.     Allergies:   Ace inhibitors   Social History   Socioeconomic History   Marital status: Widowed    Spouse name: Not on file   Number of children: 2   Years of education: Not on file   Highest education level: Some college, no degree  Occupational History   Occupation: retired     Comment: used to [employer]   Tobacco Use   Smoking  status: Former    Packs/day: 1.00    Years: 10.00    Total pack years: 10.00    Types: Cigarettes    Start date: 03/17/1965    Quit date: 11/27/1975    Years since quitting: 46.5   Smokeless tobacco: Never   Tobacco comments:    quit 40 years  Vaping Use   Vaping Use: Never used  Substance and Sexual Activity   Alcohol use: Yes    Alcohol/week: 1.0 standard drink of alcohol    Types: 1 Standard drinks or equivalent per week    Comment: socially - 1 x year   Drug use: No   Sexual activity: Yes    Partners: Female  Other Topics Concern   Not on file  Social History Narrative   Lives by himself in [place]   Two grown children, daughter in [place] and son in [place]   He goes to [place] to visit his sisters often    Social Determinants of Health   Financial Resource Strain: Low Risk  (02/11/2022)   Overall Financial Resource Strain (CARDIA)    Difficulty of Paying Living Expenses: Not hard at all  Food Insecurity: No Food Insecurity (02/11/2022)   Hunger Vital Sign    Worried About [place] in the Last Year: Never true    [place] in the Last Year: Never true  Transportation Needs: No Transportation Needs (02/11/2022)   PRAPARE - Hydrologist (Medical): No    Lack of Transportation (Non-Medical): No   Physical Activity: Sufficiently Active (02/11/2022)   Exercise Vital Sign    Days of Exercise per Week: 5 days    Minutes of Exercise per Session: 30 min  Stress: No Stress Concern Present (02/11/2022)   [place]    Feeling of Stress : Not at all  Social Connections: Moderately Integrated (02/11/2022)   Social Connection and Isolation Panel [NHANES]    Frequency of Communication with Friends and Family: More than three times a week    Frequency of Social Gatherings with Friends and Family: More than three times a week    Attends Religious Services: More than 4 times per year    Active Member of [employer] or Organizations: Yes    Attends Archivist Meetings: More than 4 times per year    Marital Status: Widowed     Family History: The patient's family history includes Diabetes in his brother, sister, and sister; Heart disease in his father; Hypertension in his father; Prostate cancer in his brother. There is no history of Kidney disease, Kidney cancer, Bladder Cancer, Colon cancer, Esophageal cancer, Inflammatory bowel disease, Liver disease, Pancreatic cancer, Rectal cancer, or Stomach cancer.  ROS:   Please see the history of present illness.    All other systems reviewed and are negative.  EKGs/Labs/Other Studies Reviewed:    The following studies were reviewed today:  Echocardiogram 06/26/21:   1. Left ventricular ejection fraction, by estimation, is 55 to 60%. The  left ventricle has normal function. The left ventricle has no regional  wall motion abnormalities. There is mild concentric left ventricular  hypertrophy. Left ventricular diastolic  parameters are consistent with Grade I diastolic dysfunction (impaired  relaxation).   2. Right ventricular systolic function is normal. The right ventricular  size is normal. Tricuspid regurgitation signal is inadequate for assessing  PA pressure.   3. Left  atrial size was moderately  dilated.   4. The mitral valve is normal in structure. Trivial mitral valve  regurgitation. No evidence of mitral stenosis.   5. The aortic valve has been repaired/replaced. Aortic valve  regurgitation is not visualized. No aortic stenosis is present. There is a  29 mm Sapien prosthetic (TAVR) valve present in the aortic position.  Procedure Date: 05/14/21. Aortic valve mean  gradient measures 10.2 mmHg. Aortic valve Vmax measures 2.20 m/s. Aortic  valve acceleration time measures 85 msec.   6. Aortic dilatation noted. There is mild dilatation of the ascending  aorta, measuring 44 mm. There is borderline dilatation of the aortic root,  measuring 40 mm.      TAVR OPERATIVE NOTE     Date of Procedure:                05/14/2021   Preoperative Diagnosis:      Severe Aortic Stenosis    Postoperative Diagnosis:    Same    Procedure:        Transcatheter Aortic Valve Replacement - Percutaneous Left Transfemoral Approach             Edwards Sapien 3 Ultra THV (size 29 mm, model # 9755RSL, serial # [id number])              Co-Surgeons:                        Gaye Pollack, MD and Sherren Mocha, MD       Anesthesiologist:                  Renaldo Reel, MD   Echocardiographer:              Osborne Oman, MD   Pre-operative Echo Findings: Severe aortic stenosis Normal left ventricular systolic function   Post-operative Echo Findings: No paravalvular leak Normal left ventricular systolic function  ____________   Echo 05/15/21: IMPRESSIONS  1. The aortic valve has been replaced by a 29 mm Sapien prosthetic (TAVR)  valve present in the aortic position. No AI/PVL. Effective orifice area,  by VTI measures 4.72 cm. Aortic valve mean gradient measures 4.0 mmHg.  Peak gradient 7 mm Hg. Accerlation   time 67 ms. DVI 0.77.   2. Left ventricular ejection fraction, by estimation, is 60 to 65%. The  left ventricle has normal function. The left ventricle has no  regional  wall motion abnormalities. There is mild concentric left ventricular  hypertrophy. Left ventricular diastolic  parameters are consistent with Grade I diastolic dysfunction (impaired  relaxation).   3. Right ventricular systolic function is normal. The right ventricular  size is normal. Tricuspid regurgitation signal is inadequate for assessing  PA pressure.   4. The mitral valve is grossly normal. No evidence of mitral valve  regurgitation. No evidence of mitral stenosis.   Comparison(s): Stable prosthetic valve. LVEF appears robust in contrast  images (not performed in priors).    _____________________________   PPM placement 37/23 Conclusion   CONCLUSIONS:   1. Successful implantation of a medtronic dual-chamber pacemaker for symptomatic bradycardia due to 2:1 AV block  2. No early apparent complications.    EKG:  EKG is not ordered today.    Recent Labs: 12/30/2021: ALT 16; BUN 18; Creat 0.97; Hemoglobin 13.8; Platelets 177; Potassium 5.2; Sodium 135   Recent Lipid Panel    Component Value Date/Time   CHOL 143 12/30/2021 1501   CHOL 131 08/27/2015 1158   TRIG  96 12/30/2021 1501   HDL 43 12/30/2021 1501   HDL 40 08/27/2015 1158   CHOLHDL 3.3 12/30/2021 1501   VLDL 20 07/28/2016 1248   LDLCALC 81 12/30/2021 1501    Physical Exam:    VS:  BP 124/70   Pulse 85   Ht '5\' 9"'$  (1.753 m)   Wt (!) 306 lb 6.4 oz (139 kg)   SpO2 98%   BMI 45.25 kg/m     Wt Readings from Last 3 Encounters:  05/19/22 (!) 306 lb 6.4 oz (139 kg)  12/30/21 299 lb (135.6 kg)  08/28/21 294 lb 12.8 oz (133.7 kg)    General: Well developed, well nourished, NAD Lungs:Clear to ausculation bilaterally. No wheezes, rales, or rhonchi. Breathing is unlabored. Cardiovascular: RRR with S1 S2. No murmurs Extremities: No edema.  Neuro: Alert and oriented. No focal deficits. No facial asymmetry. MAE spontaneously. Psych: Responds to questions appropriately with normal affect.     ASSESSMENT/PLAN:    Severe AS s/p TAVR: Doing well post TAVR with NYHA class I symptoms. Continues to walk 11mn day/ 5 days per week without issues. Tolerating medications. SBE prophylaxis discussed however he does not routinely visit the dentist. &&&&Echo today with LVEF at 55-60% with stable valve placement, mean gradient at 10.238mg, peak at 19.86m29m with AVA by VTI at 2.97cm2. Plan 1 year follow up with echo. Will have appointment with Dr. GolRockey Situ6/23.    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    HTN: BP well controlled. No changes made.    HLD: Continue statin    CHB s/p PPM: Stable with no issues. Seen by post PPM wound with programming as follows: RV lead noted oversensing. RV sensitivity programmed from 0.90 mV to 1.20 mV. PAV programmed from 180 ms to 240 ms. SAV programmed from 160ms 34m40 ms. Device programmed at 3.5V/auto capture programmed on for extra safety margin until 3 month visit.    Morbid obesity: Walking daily for 30min.74mouraged to keep this up.    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 At this time, this is encapsulated within Gerota's fascia, is separate from the right renal vein which is patent, and not associated with definite lymphadenopathy or signs of metastatic disease. He is now followed by Dr. HerrickLouis Meckelet up for April    {Are you ordering a CV Procedure (e.g. stress test, cath, DCCV, TEE, etc)?   Press F2        :[id number] Adjustments/Labs and Tests Ordered: Current medicines are reviewed at length with the patient today.  Concerns regarding medicines are outlined above.  No orders of the defined types were placed in this encounter.  No orders of the defined types were placed in this encounter.   Patient Instructions  Medication  Instructions:  Your physician recommends that you continue on your current medications as directed. Please refer to the Current Medication list given to you today.  *If you need a refill on your cardiac medications before your next appointment, please call your pharmacy*   Lab Work: None ordered   If you have labs (blood work) drawn today and your tests are completely normal, you will receive your results only by: [place]Chart[facility] [place] have MyChart) OR A paper copy in the mail If you have any lab test  that is abnormal or we need to change your treatment, we will call you to review the results.   Testing/Procedures: None ordered    Follow-Up: Follow up as scheduled   Other Instructions None     Signed, Kathyrn Drown, NP  05/19/2022 10:37 AM    [place]

## 2022-05-19 ENCOUNTER — Other Ambulatory Visit (HOSPITAL_COMMUNITY): Payer: Medicare HMO

## 2022-05-19 ENCOUNTER — Ambulatory Visit: Payer: Medicare HMO | Admitting: Cardiology

## 2022-05-19 ENCOUNTER — Ambulatory Visit: Payer: Medicare HMO

## 2022-05-19 ENCOUNTER — Ambulatory Visit: Payer: Medicare HMO | Attending: Cardiology

## 2022-05-19 VITALS — BP 124/70 | HR 85 | Ht 69.0 in | Wt 306.4 lb

## 2022-05-19 DIAGNOSIS — I77810 Thoracic aortic ectasia: Secondary | ICD-10-CM | POA: Diagnosis not present

## 2022-05-19 DIAGNOSIS — I350 Nonrheumatic aortic (valve) stenosis: Secondary | ICD-10-CM | POA: Insufficient documentation

## 2022-05-19 DIAGNOSIS — I442 Atrioventricular block, complete: Secondary | ICD-10-CM

## 2022-05-19 DIAGNOSIS — I10 Essential (primary) hypertension: Secondary | ICD-10-CM | POA: Insufficient documentation

## 2022-05-19 DIAGNOSIS — Z952 Presence of prosthetic heart valve: Secondary | ICD-10-CM | POA: Insufficient documentation

## 2022-05-19 DIAGNOSIS — Z950 Presence of cardiac pacemaker: Secondary | ICD-10-CM | POA: Diagnosis not present

## 2022-05-19 LAB — ECHOCARDIOGRAM COMPLETE
AR max vel: 1.24 cm2
AV Area VTI: 1.44 cm2
AV Area mean vel: 1.31 cm2
AV Mean grad: 9 mmHg
AV Peak grad: 17 mmHg
Ao pk vel: 2.06 m/s
Area-P 1/2: 3.39 cm2
S' Lateral: 3.1 cm

## 2022-05-19 NOTE — Patient Instructions (Signed)

## 2022-05-20 ENCOUNTER — Encounter: Payer: Self-pay | Admitting: Family Medicine

## 2022-05-20 ENCOUNTER — Ambulatory Visit (INDEPENDENT_AMBULATORY_CARE_PROVIDER_SITE_OTHER): Payer: Medicare HMO | Admitting: Family Medicine

## 2022-05-20 ENCOUNTER — Telehealth: Payer: Self-pay

## 2022-05-20 VITALS — BP 116/72 | HR 97 | Temp 97.8°F | Resp 20 | Ht 69.0 in | Wt 308.0 lb

## 2022-05-20 DIAGNOSIS — R809 Proteinuria, unspecified: Secondary | ICD-10-CM | POA: Diagnosis not present

## 2022-05-20 DIAGNOSIS — I739 Peripheral vascular disease, unspecified: Secondary | ICD-10-CM

## 2022-05-20 DIAGNOSIS — I152 Hypertension secondary to endocrine disorders: Secondary | ICD-10-CM

## 2022-05-20 DIAGNOSIS — M109 Gout, unspecified: Secondary | ICD-10-CM

## 2022-05-20 DIAGNOSIS — I77810 Thoracic aortic ectasia: Secondary | ICD-10-CM

## 2022-05-20 DIAGNOSIS — J410 Simple chronic bronchitis: Secondary | ICD-10-CM | POA: Diagnosis not present

## 2022-05-20 DIAGNOSIS — E1159 Type 2 diabetes mellitus with other circulatory complications: Secondary | ICD-10-CM

## 2022-05-20 DIAGNOSIS — E782 Mixed hyperlipidemia: Secondary | ICD-10-CM

## 2022-05-20 DIAGNOSIS — E1129 Type 2 diabetes mellitus with other diabetic kidney complication: Secondary | ICD-10-CM

## 2022-05-20 DIAGNOSIS — Z862 Personal history of diseases of the blood and blood-forming organs and certain disorders involving the immune mechanism: Secondary | ICD-10-CM

## 2022-05-20 DIAGNOSIS — D696 Thrombocytopenia, unspecified: Secondary | ICD-10-CM

## 2022-05-20 DIAGNOSIS — N401 Enlarged prostate with lower urinary tract symptoms: Secondary | ICD-10-CM | POA: Diagnosis not present

## 2022-05-20 DIAGNOSIS — I700 Atherosclerosis of aorta: Secondary | ICD-10-CM | POA: Diagnosis not present

## 2022-05-20 DIAGNOSIS — Z952 Presence of prosthetic heart valve: Secondary | ICD-10-CM

## 2022-05-20 DIAGNOSIS — R351 Nocturia: Secondary | ICD-10-CM

## 2022-05-20 LAB — POCT GLYCOSYLATED HEMOGLOBIN (HGB A1C): Hemoglobin A1C: 8.2 % — AB (ref 4.0–5.6)

## 2022-05-20 MED ORDER — METFORMIN HCL ER 750 MG PO TB24: 1500.0000 mg | ORAL_TABLET | Freq: Every day | ORAL | 0 refills | Status: DC

## 2022-05-20 MED ORDER — ATORVASTATIN CALCIUM 80 MG PO TABS: 80.0000 mg | ORAL_TABLET | Freq: Every day | ORAL | 0 refills | Status: DC

## 2022-05-20 MED ORDER — GLIPIZIDE ER 5 MG PO TB24: 5.0000 mg | ORAL_TABLET | Freq: Every day | ORAL | 0 refills | Status: DC

## 2022-05-20 MED ORDER — TAMSULOSIN HCL 0.4 MG PO CAPS: 0.4000 mg | ORAL_CAPSULE | Freq: Every day | ORAL | 0 refills | Status: DC

## 2022-05-20 MED ORDER — AMLODIPINE BESYLATE 5 MG PO TABS: 5.0000 mg | ORAL_TABLET | Freq: Every day | ORAL | 0 refills | Status: DC

## 2022-05-20 MED ORDER — PIOGLITAZONE HCL 15 MG PO TABS: 15.0000 mg | ORAL_TABLET | Freq: Every day | ORAL | 0 refills | Status: DC

## 2022-05-20 NOTE — Telephone Encounter (Addendum)
Device alert for monitored VT 3/2, >20 beats - route to triage Total of 5 NSVT, 5-11 beats 38 SVT, longest duration 79mn 52sec, HR's primarily 150's LA  Reviewed alert.   Episode started in the monitored zone and accelerated to the therapeutic zone. HR varied from 150bpm - 214bpm.  Event lasted 37 beats in duration.   No therapy.     Patient has a history of short runs of NSVT.   Will continue to monitor.

## 2022-05-20 NOTE — Progress Notes (Signed)
Name: Jordan [name]   MRN: [id number]    DOB: 07-05-1940   Date:05/20/2022       Progress Note  Subjective  Chief Complaint  Follow Up  HPI  DM with microalbuminuria , obesity, dyslipidemia and HTN and  ED: he is back on Glipizide '5mg'$  ER , Metformin 1500 mg daily, A1C is going up again, from 7.5 % to 8.1%. and today 8.2 %. Last visit we added Actos, explained he must resume a diabetic diet to get A1C below 8%  He cannot take ACE because of history of angioedema, bp is controlled with Norvasc - we will decrease dose today due to his age and risk of falls. .  He denies polyphagia, polydipsia but has polyuria - also has BPH and has nocturia, denies recent episodes of hypoglycemia.    Chronic bronchitis: he used to smoke but quit many years ago, he still has a cough that is productive at times, usually in the  mornings and it is a  pale yellow sputum, he denies wheezing  He has some sob with activity    BPH: seen by [facility] and symptoms are stable, on Flomax but still has nocturia about 2-3 times per night  Unchanged    HTN:  he is compliant with mediations,  no chest pain,, he has some SOB with activity .He has lower extremity edema but mild , last visit he was having some dizziness and we decreased norvasc from 10 mg to 7.5 mg today bp is still low and we will try going down to 5 mg of norvasc    Morbid obese : BMI above 40, using a hiking stick and walks 30 minutes about 5 days a week, but weight is going up, he states he is eating everything again. Currently not following a diabetic diet    History of Aortic valve stenosis/Asending aorta dilation: under the care of cardiologist - Dr. Rockey Situ,  no syncope or chest pain, had echo normal EF. 50-55 %. He is now also seeing Dr. 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 Allen Norris .He is still taking iron supplementation and denies pica or sob    Gout: taking allopurinol and not on diuretics  Last uric acid was normal . No recent flares .  Claudication:  he states no longer walking one hour because he has to take multiple breaks. He has been able to walks at most 25 minutes but has to stop half way through . He was seen by vascular surgeon and felt likely secondary from lumbar spine problems. Discussed risk and benefits of therapy for lumbar spine stenosis and since symptoms only when walking he will continue physical activity and stop to take breaks as needed . 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   Coronary artery disease involving native coronary artery of native heart without angina pectoris    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   1st degree AV block 05/15/2021   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   History of angina 02/12/2021   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   PAD (peripheral artery disease) ([place]) 01/17/2020   Diverticulosis of colon with hemorrhage 05/26/2019   Atherosclerosis  of aorta ([place]) 04/26/2019   Rectal bleed 04/22/2019   Chronic bronchitis ([place]) 07/30/2018   Bilateral hydrocele 11/27/2015   Ventral hernia without obstruction or gangrene 11/27/2015   Controlled type 2 diabetes mellitus with microalbuminuria ([place]) 10/26/2015   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 ([place]) 11/07/2014   Hyperlipidemia 11/07/2014   Essential hypertension 11/07/2014   Obesity, Class III, BMI 40-49.9 (morbid obesity) ([place]) 11/07/2014   Controlled gout 11/07/2014   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;  Service: Open Heart Surgery;  Laterality: N/A;   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   Procedure: PACEMAKER IMPLANT;  Surgeon: Evans Lance, MD;  Location: [facility];  Service: Cardiovascular;  Laterality: N/A;   RIGHT HEART CATH AND CORONARY ANGIOGRAPHY Bilateral 02/15/2021   Procedure: RIGHT HEART CATH AND CORONARY ANGIOGRAPHY;  Surgeon: Nelva Bush, MD;  Location: [facility];  Service: Cardiovascular;  Laterality: Bilateral;   TRANSCATHETER AORTIC VALVE REPLACEMENT, TRANSFEMORAL N/A 05/14/2021   Procedure: Transcatheter Aortic Valve Replacement, Transfemoral;  Surgeon: Sherren Mocha, MD;  Location: [facility];  Service: Open Heart Surgery;  Laterality: N/A;    UMBILICAL HERNIA REPAIR  03/17/2009    Family History  Problem Relation Age of Onset   Heart disease Father    Hypertension Father    Diabetes Sister    Diabetes Brother    Prostate cancer Brother    Diabetes Sister    Kidney disease Neg Hx    Kidney cancer Neg Hx    Bladder Cancer Neg Hx    Colon cancer Neg Hx    Esophageal cancer Neg Hx    Inflammatory bowel disease Neg Hx    Liver disease Neg Hx    Pancreatic cancer Neg Hx    Rectal cancer Neg Hx    Stomach cancer Neg Hx     Social History   Tobacco Use   Smoking status: Former    Packs/day: 1.00    Years: 10.00    Total pack years: 10.00    Types: Cigarettes    Start date: 03/17/1965    Quit  date: 11/27/1975    Years since quitting: 46.5   Smokeless tobacco: Never   Tobacco comments:    quit 40 years  Substance Use Topics   Alcohol use: Yes    Alcohol/week: 1.0 standard drink of alcohol    Types: 1 Standard drinks or equivalent per week    Comment: socially - 1 x year     Current Outpatient Medications:    ACCU-CHEK AVIVA PLUS test strip, TEST TWO TIMES DAILY AS NEEDED AS DIRECTED, Disp: 200 strip, Rfl: 3   Accu-Chek Softclix Lancets lancets, TEST BLOOD SUGAR TWICE DAILY AS DIRECTED, Disp: 200 each, Rfl: 3   acetaminophen (TYLENOL) 325 MG tablet, Take 2 tablets (650 mg total) by mouth every 4 (four) hours as needed for headache or mild pain., Disp: , Rfl:    Alcohol Swabs (B-D SINGLE USE SWABS REGULAR) PADS, 1 each by Does not apply route as needed., Disp: 100 each, Rfl: 3   allopurinol (ZYLOPRIM) 300 MG tablet, TAKE 1 TABLET EVERY DAY, Disp: 90 tablet, Rfl: 2   amLODipine (NORVASC) 2.5 MG tablet, Take 1 tablet (2.5 mg total) by mouth daily. With the 5 mg dose, Disp: 90 tablet, Rfl: 1   amLODipine (NORVASC) 5 MG tablet, Take 1 tablet (5 mg total) by mouth daily. Take with the 2.5 mg tablet, Disp: 90 tablet, Rfl: 1   aspirin EC 81 MG tablet, Take 1 tablet (81 mg total) by mouth daily. Swallow whole., Disp: , Rfl:     atorvastatin (LIPITOR) 80 MG tablet, TAKE 1 TABLET AT BEDTIME., Disp: 90 tablet, Rfl: 0   Cholecalciferol (VITAMIN D3 PO), Take 1 tablet by mouth every Monday, Wednesday, and Friday., Disp: , Rfl:    ferrous sulfate 325 (65 FE) MG tablet, Take 325 mg by mouth daily., Disp: , Rfl:    furosemide (LASIX) 20 MG tablet, Take 1 tablet by mouth once daily, Disp: 90 tablet, Rfl: 2   glipiZIDE (GLUCOTROL XL) 5 MG 24 hr tablet, Take 1 tablet (5 mg total) by mouth daily with breakfast., Disp: 90 tablet, Rfl: 1   latanoprost (XALATAN) 0.005 % ophthalmic solution, Place 1 drop into the left eye at bedtime. , Disp: , Rfl:    metFORMIN (GLUCOPHAGE-XR) 750 MG 24 hr tablet, Take 2 tablets (1,500 mg total) by mouth daily with breakfast., Disp: 180 tablet, Rfl: 1   Omega-3 Fatty Acids (FISH OIL PO), Take 1 capsule by mouth 2 (two) times daily. , Disp: , Rfl:    pioglitazone (ACTOS) 15 MG tablet, Take 1 tablet (15 mg total) by mouth daily., Disp: 90 tablet, Rfl: 1   polyethylene glycol (MIRALAX / GLYCOLAX) packet, Take 17 g by mouth at bedtime. , Disp: , Rfl:    tadalafil (CIALIS) 10 MG tablet, Take 1-2 tablets (10-20 mg total) by mouth every three (3) days as needed for erectile dysfunction., Disp: 30 tablet, Rfl: 0   tamsulosin (FLOMAX) 0.4 MG CAPS capsule, Take 1 capsule (0.4 mg total) by mouth daily., Disp: 90 capsule, Rfl: 1   Blood Glucose Calibration (TRUE METRIX LEVEL 1) Low SOLN, 1 Bottle by In Vitro route once for 1 dose., Disp: 1 each, Rfl: 0   Blood Glucose Monitoring Suppl (TRUE METRIX AIR GLUCOSE METER) DEVI, 1 each by Does not apply route once for 1 dose., Disp: 1 each, Rfl: 0  Allergies  Allergen Reactions   Ace Inhibitors Other (See Comments)    angioedema    I personally reviewed active problem list, medication list, allergies, family  history, social history, health maintenance with the patient/caregiver today.   ROS  Ten systems reviewed and is negative except as mentioned in HPI    Objective  Vitals:   05/20/22 1352  BP: 116/72  Pulse: 97  Resp: 20  Temp: 97.8 F (36.6 C)  TempSrc: Oral  SpO2: 100%  Weight: (!) 308 lb (139.7 kg)  Height: '5\' 9"'$  (1.753 m)    Body mass index is 45.48 kg/m.  Physical Exam  Constitutional: Patient appears well-developed and well-nourished. Obese  No distress.  HEENT: head atraumatic, normocephalic, pupils equal and reactive to light,, neck supple Cardiovascular: Normal rate, regular rhythm and normal heart sounds.  No murmur heard. Trace  BLE edema. Pulmonary/Chest: Effort normal and breath sounds normal. No respiratory distress. Abdominal: Soft.  There is no tenderness. Psychiatric: Patient has a normal mood and affect. behavior is normal. Judgment and thought content normal.   Recent Results (from the past 2160 hour(s))  CUP PACEART REMOTE DEVICE CHECK     Status: None   Collection Time: 02/24/22  7:36 PM  Result Value Ref Range   Date Time Interrogation Session [id number]    Pulse Generator Manufacturer MERM    Pulse Gen Model [id number] Azure XT DR MRI    Pulse Gen Serial Number [id number] [facility]    Implantable Pulse Generator Type Implantable Pulse Generator    Implantable Pulse Generator Implant Date XK:2188682    Implantable Lead Manufacturer MERM    Implantable Lead Model 3830 SelectSecure MRI SureScan    Implantable Lead Serial Number [id number]    Implantable Lead Implant Date XK:2188682    Implantable Lead Location Detail 1 UNKNOWN    Implantable Lead Special Function LBBB    Implantable Lead Location [id number]    Implantable Lead Connection Status [id number]    Implantable Lead Manufacturer MERM    Implantable Lead Model 5076 CapSureFix Novus MRI SureScan    Implantable Lead Serial Number [id number]    Implantable Lead Implant Date XK:2188682    Implantable Lead Location Detail 1 APPENDAGE    Implantable Lead Location [id number]    Implantable Lead Connection Status [id number]    Lead Channel  Setting Sensing Sensitivity 1.2 mV   Lead Channel Setting Pacing Amplitude 1.5 V   Lead Channel Setting Pacing Pulse Width 0.4 ms   Lead Channel Setting Pacing Amplitude 2 V   Zone Setting Status [id number]    Zone Setting Status [phone]    Lead Channel Impedance Value 399 ohm   Lead Channel Impedance Value 342 ohm   Lead Channel Sensing Intrinsic Amplitude 4.75 mV   Lead Channel Sensing Intrinsic Amplitude 4.75 mV   Lead Channel Pacing Threshold Amplitude 0.625 V   Lead Channel Pacing Threshold Pulse Width 0.4 ms   Lead Channel Impedance Value 608 ohm   Lead Channel Impedance Value 399 ohm   Lead Channel Sensing Intrinsic Amplitude 18 mV   Lead Channel Sensing Intrinsic Amplitude 18 mV   Lead Channel Pacing Threshold Amplitude 0.75 V   Lead Channel Pacing Threshold Pulse Width 0.4 ms   Battery Status OK    Battery Remaining Longevity 175 mo   Battery Voltage 3.14 V   Brady Statistic RA Percent Paced 2.71 %   Brady Statistic RV Percent Paced 0.04 %   Brady Statistic AP VP Percent 0.01 %   Brady Statistic AS VP Percent 0.03 %   Brady Statistic AP VS Percent 2.69 %   Brady Statistic AS VS Percent  97.27 %  HM DIABETES EYE EXAM     Status: None   Collection Time: 04/16/22 12:00 AM  Result Value Ref Range   HM Diabetic Eye Exam No Retinopathy No Retinopathy  ECHOCARDIOGRAM COMPLETE     Status: None   Collection Time: 05/19/22  9:54 AM  Result Value Ref Range   Area-P 1/2 3.39 cm2   S' Lateral 3.10 cm   AV Area mean vel 1.31 cm2   AR max vel 1.24 cm2   AV Area VTI 1.44 cm2   Ao pk vel 2.06 m/s   AV Mean grad 9.0 mmHg   AV Peak grad 17.0 mmHg   Est EF 55 - 60%   POCT HgB A1C     Status: Abnormal   Collection Time: 05/20/22  2:01 PM  Result Value Ref Range   Hemoglobin A1C 8.2 (A) 4.0 - 5.6 %   HbA1c POC (<> result, manual entry)     HbA1c, POC (prediabetic range)     HbA1c, POC (controlled diabetic range)      PHQ2/9:    05/20/2022    2:01 PM 02/11/2022    9:44 AM 12/30/2021     1:50 PM 08/27/2021    1:44 PM 04/17/2021   11:02 AM  Depression screen PHQ 2/9  Decreased Interest 0 0 0 0 0  Down, Depressed, Hopeless 0 0 0 0 0  PHQ - 2 Score 0 0 0 0 0  Altered sleeping 0  0 0 0  Tired, decreased energy 0  0 0 0  Change in appetite 0  0 0 0  Feeling bad or failure about yourself  0  0 0 0  Trouble concentrating 0  0 0 0  Moving slowly or fidgety/restless 0  0 0 0  Suicidal thoughts 0  0 0 0  PHQ-9 Score 0  0 0 0    phq 9 is negative   Fall Risk:    05/20/2022    2:01 PM 02/11/2022    9:44 AM 12/30/2021    1:50 PM 08/27/2021    1:44 PM 04/17/2021   11:02 AM  [place] in the past year? 0 0 0 0 0  Number falls in past yr:  0 0 0 0  Injury with Fall?  0 0 0 0  Risk for fall due to : Impaired balance/gait No Fall Risks No Fall Risks No Fall Risks;Impaired balance/gait No Fall Risks  Follow up Falls prevention discussed;Education provided;Falls evaluation completed Falls evaluation completed Falls prevention discussed Falls prevention discussed Falls prevention discussed      Functional Status Survey: Is the patient deaf or have difficulty hearing?: No Does the patient have difficulty seeing, even when wearing glasses/contacts?: No Does the patient have difficulty concentrating, remembering, or making decisions?: No Does the patient have difficulty walking or climbing stairs?: Yes Does the patient have difficulty dressing or bathing?: No Does the patient have difficulty doing errands alone such as visiting a doctor's office or shopping?: No    Assessment & Plan  1. Controlled type 2 diabetes mellitus with microalbuminuria, without long-term current use of insulin (HCC)  - POCT HgB A1C - metFORMIN (GLUCOPHAGE-XR) 750 MG 24 hr tablet; Take 2 tablets (1,500 mg total) by mouth daily with breakfast.  Dispense: 180 tablet; Refill: 0 - pioglitazone (ACTOS) 15 MG tablet; Take 1 tablet (15 mg total) by mouth daily.  Dispense: 90 tablet; Refill: 0 -  glipiZIDE (GLUCOTROL XL) 5 MG 24 hr  tablet; Take 1 tablet (5 mg total) by mouth daily with breakfast.  Dispense: 90 tablet; Refill: 0  2. Atherosclerosis of aorta (HCC)  - atorvastatin (LIPITOR) 80 MG tablet; Take 1 tablet (80 mg total) by mouth at bedtime.  Dispense: 90 tablet; Refill: 0  3. Hypertension associated with diabetes ([place])  - pioglitazone (ACTOS) 15 MG tablet; Take 1 tablet (15 mg total) by mouth daily.  Dispense: 90 tablet; Refill: 0 - amLODipine (NORVASC) 5 MG tablet; Take 1 tablet (5 mg total) by mouth daily. Take with the 2.5 mg tablet  Dispense: 90 tablet; Refill: 0  4. Benign prostatic hyperplasia with nocturia  - tamsulosin (FLOMAX) 0.4 MG CAPS capsule; Take 1 capsule (0.4 mg total) by mouth daily.  Dispense: 90 capsule; Refill: 0  5. Simple chronic bronchitis (HCC)  stable  6. 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HCC)  Under the care of Dr. Rockey Situ  7. Thrombocytopenia ([place])  Recheck it yearly   8. Obesity, Class III, BMI 40-49.9 (morbid obesity) ([place])  He needs to stop eating jelly beans  9. Controlled gout  Doing well   10. Mixed hyperlipidemia  - atorvastatin (LIPITOR) 80 MG tablet; Take 1 tablet (80 mg total) by mouth at bedtime.  Dispense: 90 tablet; Refill: 0  11. Claudication of both lower extremities (HCC)  Stable, continue regular physical activity   12. S/P TAVR (transcatheter aortic valve replacement)   13. History of iron deficiency anemia

## 2022-05-21 ENCOUNTER — Ambulatory Visit: Payer: Medicare HMO

## 2022-05-21 DIAGNOSIS — I440 Atrioventricular block, first degree: Secondary | ICD-10-CM

## 2022-05-21 LAB — CUP PACEART REMOTE DEVICE CHECK
Battery Remaining Longevity: 172 mo
Battery Voltage: 3.11 V
Brady Statistic AP VP Percent: 0 %
Brady Statistic AP VS Percent: 2.68 %
Brady Statistic AS VP Percent: 0.02 %
Brady Statistic AS VS Percent: 97.3 %
Brady Statistic RA Percent Paced: 2.7 %
Brady Statistic RV Percent Paced: 0.02 %
Date Time Interrogation Session: 20240305201132
Implantable Lead Connection Status: 753985
Implantable Lead Connection Status: 753985
Implantable Lead Implant Date: 20230307
Implantable Lead Implant Date: 20230307
Implantable Lead Location: 753859
Implantable Lead Location: 753860
Implantable Lead Model: 3830
Implantable Lead Model: 5076
Implantable Pulse Generator Implant Date: 20230307
Lead Channel Impedance Value: 323 Ohm
Lead Channel Impedance Value: 380 Ohm
Lead Channel Impedance Value: 399 Ohm
Lead Channel Impedance Value: 589 Ohm
Lead Channel Pacing Threshold Amplitude: 0.375 V
Lead Channel Pacing Threshold Amplitude: 0.875 V
Lead Channel Pacing Threshold Pulse Width: 0.4 ms
Lead Channel Pacing Threshold Pulse Width: 0.4 ms
Lead Channel Sensing Intrinsic Amplitude: 17.125 mV
Lead Channel Sensing Intrinsic Amplitude: 17.125 mV
Lead Channel Sensing Intrinsic Amplitude: 4.5 mV
Lead Channel Sensing Intrinsic Amplitude: 4.5 mV
Lead Channel Setting Pacing Amplitude: 1.5 V
Lead Channel Setting Pacing Amplitude: 2 V
Lead Channel Setting Pacing Pulse Width: 0.4 ms
Lead Channel Setting Sensing Sensitivity: 1.2 mV
Zone Setting Status: 755011
Zone Setting Status: 755011

## 2022-05-23 ENCOUNTER — Ambulatory Visit: Payer: Medicare HMO | Admitting: Podiatry

## 2022-05-23 DIAGNOSIS — E1142 Type 2 diabetes mellitus with diabetic polyneuropathy: Secondary | ICD-10-CM | POA: Diagnosis not present

## 2022-05-23 DIAGNOSIS — B351 Tinea unguium: Secondary | ICD-10-CM | POA: Diagnosis not present

## 2022-05-23 DIAGNOSIS — M79676 Pain in unspecified toe(s): Secondary | ICD-10-CM

## 2022-05-23 NOTE — Progress Notes (Signed)
Complaint:  Visit Type: Patient returns to my office for risk foot care.  This patient requires this care by a professional since this patient will be at a high risk due to having diabetes.  This patient is unable to cut his own nails since he cannot reach his nails.  This patient presents for at risk foot  care.  Podiatric Exam: Vascular: dorsalis pedis and posterior tibial pulses are  weakly palpable bilateral. Capillary return is immediate. Temperature gradient is WNL. Skin turgor WNL  Sensorium: Normal Semmes Weinstein monofilament test. Normal tactile sensation bilaterally. Nail Exam: Pt has thick disfigured discolored nails with subungual debris noted bilateral entire nail hallux through fifth toenails.  Nails are thick painful and deformed 1-5  B/L. Ulcer Exam: There is no evidence of ulcer or pre-ulcerative changes or infection. Orthopedic Exam: Muscle tone and strength are WNL. No limitations in general ROM. No crepitus or effusions noted. HAV  B/L. Hammer toes second  B/L Skin: No Porokeratosis. No infection or ulcers.  Corn second toe medial aspect @ PIPJ. Right foot.  Diagnosis:  Onychomycosis, , Pain in right toe, pain in left toes, Corn/callus  Treatment & Plan Procedures and Treatment: Consent by patient was obtained for treatment procedures. The patient understood the discussion of treatment and procedures well. All questions were answered thoroughly reviewed. Debridement of mycotic and hypertrophic toenails, 1 through 5 bilateral and clearing of subungual debris. No ulceration, no infection noted.   Told this patient to return for periodic foot evaluation to help reduce potential of at risk complications.  Gave him  pads due to corn. Return Visit-Office Procedure: Patient instructed to return to the office for a follow up visit 3 months for continued evaluation and treatment.    Boneta Lucks D.P.M.

## 2022-06-23 DIAGNOSIS — D49511 Neoplasm of unspecified behavior of right kidney: Secondary | ICD-10-CM | POA: Diagnosis not present

## 2022-06-25 NOTE — Progress Notes (Signed)
Remote pacemaker transmission.   

## 2022-06-30 DIAGNOSIS — D49511 Neoplasm of unspecified behavior of right kidney: Secondary | ICD-10-CM | POA: Diagnosis not present

## 2022-06-30 DIAGNOSIS — N281 Cyst of kidney, acquired: Secondary | ICD-10-CM | POA: Diagnosis not present

## 2022-06-30 DIAGNOSIS — C649 Malignant neoplasm of unspecified kidney, except renal pelvis: Secondary | ICD-10-CM | POA: Diagnosis not present

## 2022-07-11 DIAGNOSIS — D49511 Neoplasm of unspecified behavior of right kidney: Secondary | ICD-10-CM | POA: Diagnosis not present

## 2022-07-16 ENCOUNTER — Encounter: Payer: Self-pay | Admitting: Family Medicine

## 2022-07-27 NOTE — Progress Notes (Unsigned)
Cardiology Office Note  Date:  07/28/2022   ID:  KIMSEY VELARDO, DOB April 19, 1940, MRN [id number]  PCP:  Alba Cory, MD   Chief Complaint  Patient presents with   Follow-up    "Doing well." Medications reviewed by the patient verbally.     HPI:  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  Renal mass followed by urology Who presents for f/u of his aortic valve stenosis, s/p TAVR , pacer for symptomatic bradycardia  Last seen in clinic by myself June 2023 seen by one of our providers March 2024  Echo March 2024 EF 55 to 60%, well-functioning TAVR valve  In follow-up today reports he feels well Walks with a cane  denies significant lower extremity edema No significant shortness of breath or chest pain on exertion  Lab work reviewed A1c 8.2 Total cholesterol 143 LDL 81 Creatinine 0.97  EKG personally reviewed by myself on todays visit Normal sinus rhythm rate 98 bpm no significant ST-T wave changes  Other past medical history reviewed LHC performed 02/2021 showed 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 Otherwise, there was mild-moderate, non-obstructive coronary artery disease.   s/p TAVR 05/14/21  Post operative echo showed EF 60%, normally functioning TAVR with a mean gradient of 4 mmHg and no PVL. 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 Ladona Ridgel.   Echo 03/2020, The aortic valve has an indeterminant number of cusps. There is  moderate calcification of the aortic valve. There is severe thickening of  the aortic valve. Aortic valve regurgitation is not  visualized. Severe  aortic valve stenosis. Aortic valve mean  gradient measures 47.0 mmHg.    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  PSH:    Past Surgical History:  Procedure Laterality Date   cataract surgery     COLONOSCOPY  03/17/2010   COLONOSCOPY N/A 04/22/2019   Procedure: COLONOSCOPY;  Surgeon: Mansouraty, Netty Starring., MD;  Location: [facility];  Service: Gastroenterology;  Laterality: N/A;   EYE SURGERY  03/17/2009   cataract   HERNIA REPAIR     INTRAOPERATIVE TRANSTHORACIC ECHOCARDIOGRAM N/A 05/14/2021   Procedure: INTRAOPERATIVE TRANSTHORACIC ECHOCARDIOGRAM;  Surgeon: Tonny Bollman, MD;  Location: [facility] INVASIVE CV LAB;  Service: Open Heart Surgery;  Laterality: N/A;   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   Procedure: PACEMAKER IMPLANT;  Surgeon: Marinus Maw, MD;  Location: [facility] INVASIVE CV LAB;  Service: Cardiovascular;  Laterality: N/A;   RIGHT HEART CATH AND CORONARY ANGIOGRAPHY Bilateral 02/15/2021   Procedure: RIGHT HEART CATH AND CORONARY ANGIOGRAPHY;  Surgeon: Yvonne Kendall, MD;  Location: [facility] INVASIVE CV LAB;  Service: Cardiovascular;  Laterality: Bilateral;   TRANSCATHETER AORTIC VALVE REPLACEMENT, TRANSFEMORAL N/A 05/14/2021   Procedure: Transcatheter Aortic Valve Replacement, Transfemoral;  Surgeon: Tonny Bollman, MD;  Location: [facility] CV LAB;  Service: Open Heart Surgery;  Laterality: N/A;  UMBILICAL HERNIA REPAIR  03/17/2009    Current Outpatient Medications  Medication Sig Dispense Refill   ACCU-CHEK AVIVA PLUS test strip TEST  TWO TIMES DAILY AS NEEDED AS DIRECTED 200 strip 3   Accu-Chek Softclix Lancets lancets TEST BLOOD SUGAR TWICE DAILY AS DIRECTED 200 each 3   acetaminophen (TYLENOL) 325 MG tablet Take 2 tablets (650 mg total) by mouth every 4 (four) hours as needed for headache or mild pain.     acetaminophen-codeine (TYLENOL #3) 300-30 MG tablet Take 1 tablet by mouth every 6 (six) hours as needed.     Alcohol Swabs (B-D SINGLE USE SWABS REGULAR) PADS 1 each by Does not apply route as needed. 100 each 3   allopurinol (ZYLOPRIM) 300 MG tablet TAKE 1 TABLET EVERY DAY 90 tablet 2   amLODipine (NORVASC) 5 MG tablet Take 5 mg by mouth daily.     aspirin EC 81 MG tablet Take 1 tablet (81 mg total) by mouth daily. Swallow whole.     atorvastatin (LIPITOR) 80 MG tablet Take 1 tablet (80 mg total) by mouth at bedtime. 90 tablet 0   Blood Glucose Calibration (TRUE METRIX LEVEL 1) Low SOLN 1 Bottle by In Vitro route once for 1 dose. 1 each 0   Blood Glucose Monitoring Suppl (TRUE METRIX AIR GLUCOSE METER) DEVI 1 each by Does not apply route once for 1 dose. 1 each 0   Cholecalciferol (VITAMIN D3 PO) Take 1 tablet by mouth every Monday, Wednesday, and Friday.     ferrous sulfate 325 (65 FE) MG tablet Take 325 mg by mouth daily.     furosemide (LASIX) 20 MG tablet Take 1 tablet by mouth once daily 90 tablet 2   glipiZIDE (GLUCOTROL XL) 5 MG 24 hr tablet Take 1 tablet (5 mg total) by mouth daily with breakfast. 90 tablet 0   latanoprost (XALATAN) 0.005 % ophthalmic solution Place 1 drop into the left eye at bedtime.      metFORMIN (GLUCOPHAGE-XR) 750 MG 24 hr tablet Take 2 tablets (1,500 mg total) by mouth daily with breakfast. 180 tablet 0   Omega-3 Fatty Acids (FISH OIL PO) Take 1 capsule by mouth 2 (two) times daily.      pioglitazone (ACTOS) 15 MG tablet Take 1 tablet (15 mg total) by mouth daily. 90 tablet 0   polyethylene glycol (MIRALAX / GLYCOLAX) packet Take 17 g by mouth at bedtime.      tadalafil (CIALIS) 10 MG  tablet Take 1-2 tablets (10-20 mg total) by mouth every three (3) days as needed for erectile dysfunction. 30 tablet 0   tamsulosin (FLOMAX) 0.4 MG CAPS capsule Take 1 capsule (0.4 mg total) by mouth daily. 90 capsule 0   No current facility-administered medications for this visit.    Allergies:   Ace inhibitors   Social History:  The patient  reports that he quit smoking about 46 years ago. His smoking use included cigarettes. He started smoking about 57 years ago. He has a 10.00 pack-year smoking history. He has never used smokeless tobacco. He reports current alcohol use of about 1.0 standard drink of alcohol per week. He reports that he does not use drugs.   Family History:   family history includes Diabetes in his brother, sister, and sister; Heart disease in his father; Hypertension in his father; Prostate cancer in his brother.    Review of Systems: Review of Systems  Constitutional: Negative.   HENT: Negative.    Respiratory: Negative.  Cardiovascular: Negative.   Gastrointestinal: Negative.   Musculoskeletal: Negative.        Leg weakness  Neurological: Negative.   Psychiatric/Behavioral: Negative.    All other systems reviewed and are negative.   PHYSICAL EXAM: VS:  BP 110/60 (BP Location: Left Arm, Patient Position: Sitting, Cuff Size: Large)   Pulse 98   Ht 5\' 9"  (1.753 m)   Wt (!) 307 lb 6 oz (139.4 kg)   SpO2 98%   BMI 45.39 kg/m  , BMI Body mass index is 45.39 kg/m. Constitutional:  oriented to person, place, and time. No distress.  HENT:  Head: Grossly normal Eyes:  no discharge. No scleral icterus.  Neck: No JVD, no carotid bruits  Cardiovascular: Regular rate and rhythm, no murmurs appreciated Pulmonary/Chest: Clear to auscultation bilaterally, no wheezes or rails Abdominal: Soft.  no distension.  no tenderness.  Musculoskeletal: Normal range of motion Neurological:  normal muscle tone. Coordination normal. No atrophy Skin: Skin warm and  dry Psychiatric: normal affect, pleasant  Recent Labs: 12/30/2021: ALT 16; BUN 18; Creat 0.97; Hemoglobin 13.8; Platelets 177; Potassium 5.2; Sodium 135    Lipid Panel Lab Results  Component Value Date   CHOL 143 12/30/2021   HDL 43 12/30/2021   LDLCALC 81 12/30/2021   TRIG 96 12/30/2021      Wt Readings from Last 3 Encounters:  07/28/22 (!) 307 lb 6 oz (139.4 kg)  05/20/22 (!) 308 lb (139.7 kg)  05/19/22 (!) 306 lb 6.4 oz (139 kg)     ASSESSMENT AND PLAN:  Aortic valve stenosis, severe Moderate aortic valve stenosis June 2020 Severe stenosis January 2022 cardiac catheterization, nonobstructive disease TAVR placement February 2023 Recent echo showing stable valve prosthesis Clinically stable  Essential hypertension - Plan: EKG 12-Lead Blood pressure is well controlled on today's visit. No changes made to the medications.  Mixed hyperlipidemia Continue Lipitor 80 daily  goal LDL less than 70  Controlled type 2 diabetes mellitus with microalbuminuria, without long-term current use of insulin (HCC) A1c typically trending higher, 8 Sedentary Dietary changes recommended, low carbohydrate and walking program discussed  Obesity, Class III, BMI 40-49.9 (morbid obesity) (HCC) Recommend he continue his walking program and calorie restriction for weight loss   Total encounter time more than 30 minutes  Greater than 50% was spent in counseling and coordination of care with the patient    Orders Placed This Encounter  Procedures   EKG 12-Lead     Signed, Dossie Arbour, M.D., Ph.D. 07/28/2022  [facility] [place], [place] [phone]

## 2022-07-28 ENCOUNTER — Ambulatory Visit: Payer: Medicare HMO | Attending: Cardiovascular Disease | Admitting: Cardiovascular Disease

## 2022-07-28 ENCOUNTER — Encounter: Payer: Self-pay | Admitting: Cardiovascular Disease

## 2022-07-28 VITALS — BP 110/60 | HR 98 | Ht 69.0 in | Wt 307.4 lb

## 2022-07-28 DIAGNOSIS — I10 Essential (primary) hypertension: Secondary | ICD-10-CM | POA: Diagnosis not present

## 2022-07-28 DIAGNOSIS — I700 Atherosclerosis of aorta: Secondary | ICD-10-CM | POA: Diagnosis not present

## 2022-07-28 DIAGNOSIS — Z952 Presence of prosthetic heart valve: Secondary | ICD-10-CM | POA: Diagnosis not present

## 2022-07-28 DIAGNOSIS — I442 Atrioventricular block, complete: Secondary | ICD-10-CM | POA: Diagnosis not present

## 2022-07-28 DIAGNOSIS — I350 Nonrheumatic aortic (valve) stenosis: Secondary | ICD-10-CM | POA: Diagnosis not present

## 2022-07-28 DIAGNOSIS — I77810 Thoracic aortic ectasia: Secondary | ICD-10-CM

## 2022-07-28 DIAGNOSIS — I739 Peripheral vascular disease, unspecified: Secondary | ICD-10-CM

## 2022-07-28 DIAGNOSIS — Z950 Presence of cardiac pacemaker: Secondary | ICD-10-CM | POA: Diagnosis not present

## 2022-07-28 NOTE — Patient Instructions (Signed)
Medication Instructions:  No changes  If you need a refill on your cardiac medications before your next appointment, please call your pharmacy.   Lab work: No new labs needed  Testing/Procedures: No new testing needed  Follow-Up: At [facility], you and your health needs are our priority.  As part of our continuing mission to provide you with exceptional heart care, we have created designated Provider Care Teams.  These Care Teams include your primary Cardiologist (physician) and Advanced Practice Providers (APPs -  Physician Assistants and Nurse Practitioners) who all work together to provide you with the care you need, when you need it.  You will need a follow up appointment in 12 months  Providers on your designated Care Team:   Christopher Berge, NP Ryan Dunn, PA-C Cadence Furth, PA-C  COVID-19 Vaccine Information can be found at: [email or web address] For questions related to vaccine distribution or appointments, please email [email or web address] or call [phone].   

## 2022-08-14 ENCOUNTER — Other Ambulatory Visit: Payer: Self-pay | Admitting: Family Medicine

## 2022-08-14 DIAGNOSIS — E1129 Type 2 diabetes mellitus with other diabetic kidney complication: Secondary | ICD-10-CM

## 2022-08-14 DIAGNOSIS — I700 Atherosclerosis of aorta: Secondary | ICD-10-CM

## 2022-08-14 DIAGNOSIS — E1159 Type 2 diabetes mellitus with other circulatory complications: Secondary | ICD-10-CM

## 2022-08-14 DIAGNOSIS — N401 Enlarged prostate with lower urinary tract symptoms: Secondary | ICD-10-CM

## 2022-08-14 DIAGNOSIS — E782 Mixed hyperlipidemia: Secondary | ICD-10-CM

## 2022-08-20 ENCOUNTER — Ambulatory Visit: Payer: Medicare HMO

## 2022-08-21 ENCOUNTER — Other Ambulatory Visit: Payer: Self-pay | Admitting: Cardiovascular Disease

## 2022-08-25 ENCOUNTER — Ambulatory Visit: Payer: Medicare HMO | Admitting: Podiatry

## 2022-08-25 ENCOUNTER — Encounter: Payer: Self-pay | Admitting: Podiatry

## 2022-08-25 VITALS — BP 137/73

## 2022-08-25 DIAGNOSIS — B351 Tinea unguium: Secondary | ICD-10-CM | POA: Diagnosis not present

## 2022-08-25 DIAGNOSIS — E1142 Type 2 diabetes mellitus with diabetic polyneuropathy: Secondary | ICD-10-CM | POA: Diagnosis not present

## 2022-08-25 DIAGNOSIS — L84 Corns and callosities: Secondary | ICD-10-CM | POA: Diagnosis not present

## 2022-08-25 DIAGNOSIS — M79676 Pain in unspecified toe(s): Secondary | ICD-10-CM | POA: Diagnosis not present

## 2022-08-30 NOTE — Progress Notes (Signed)
  Subjective:  Patient ID: Jordan [name], male    DOB: 11/30/1940,  MRN: [id number]  DANTRELL VANROSSUM presents to clinic today for at risk foot care with history of diabetic neuropathy and corn(s) right foot and painful thick toenails that are difficult to trim. Painful toenails interfere with ambulation. Aggravating factors include wearing enclosed shoe gear. Pain is relieved with periodic professional debridement. Painful corns are aggravated when weightbearing when wearing enclosed shoe gear. Pain is relieved with periodic professional debridement.  Chief Complaint  Patient presents with   Nail Problem    DFC,Referring Provider Alba Cory, MD,lov:03/24,A1C:8.2,BS:130,      New problem(s): None.   PCP is Alba Cory, MD.  Allergies  Allergen Reactions   Ace Inhibitors Other (See Comments)    angioedema    Review of Systems: Negative except as noted in the HPI.  Objective: No changes noted in today's physical examination. Vitals:   08/25/22 1357  BP: 137/73   PANTH HIEB is a pleasant 82 y.o. male WD, WN in NAD. AAO x 3.  Vascular Examination: CFT <3 seconds b/l. DP/PT pulses faintly palpable b/l. Skin temperature gradient warm to warm b/l. No pain with calf compression. No ischemia or gangrene. No cyanosis or clubbing noted b/l.    Neurological Examination: Sensation grossly intact b/l with 10 gram monofilament. Vibratory sensation intact b/l. Pt has subjective symptoms of neuropathy.  Dermatological Examination: Pedal skin warm and supple b/l.   No open wounds. No interdigital macerations.  Toenails 1-5 b/l thick, discolored, elongated with subungual debris and pain on dorsal palpation.    Hyperkeratotic lesion(s) medial PIPJ of R 2nd toe.  No erythema, no edema, no drainage, no fluctuance.  Musculoskeletal Examination: Muscle strength 5/5 to b/l LE. Normal muscle strength 5/5 to all lower extremity muscle groups bilaterally. Hallux valgus with  bunion deformity noted b/l lower extremities. Hammertoe(s) noted to the bilateral 2nd toes.. No pain, crepitus or joint limitation noted with ROM b/l LE.  Patient ambulates independently without assistive aids.  Radiographs: None  Last A1c:      Latest Ref Rng & Units 05/20/2022    2:01 PM 12/30/2021    2:00 PM  Hemoglobin A1C  Hemoglobin-A1c 4.0 - 5.6 % 8.2  8.1    Assessment/Plan: 1. 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 Corns   3. Diabetic polyneuropathy associated with type 2 diabetes mellitus (HCC)   -Patient was evaluated and treated. All patient's and/or POA's questions/concerns answered on today's visit. -Diabetic foot examination performed today. -Patient to continue soft, supportive shoe gear daily. -Toenails 1-5 b/l were debrided in length and girth with sterile nail nippers and dremel without iatrogenic bleeding.  -Corn(s) R 2nd toe pared utilizing sterile scalpel blade without complication or incident. Total number debrided=1. -Dispensed tube foam. Apply to right great toe or right 2nd digit every morning. Remove every evening. -Patient/POA to call should there be question/concern in the interim.   Return in about 3 months (around 11/25/2022).  Freddie Breech, DPM

## 2022-09-23 NOTE — Progress Notes (Signed)
Name: Jordan [name]   MRN: [id number]    DOB: March 20, 1940   Date:09/24/2022       Progress Note  Subjective  Chief Complaint  Follow Up  HPI  DM with microalbuminuria , obesity, dyslipidemia and HTN and  ED: he is back on Glipizide 5mg  ER , Metformin 1500 mg daily and pioglitazone 15 mg , A1C is going up again, from 7.5 % to 8.1%. it went up to  8.2 % but today is down to 7.2 %  He cannot take ACE because of history of angioedema, bp is controlled with Norvasc .  He denies polyphagia, polydipsia but has polyuria - also has BPH and has nocturia, denies recent episodes of hypoglycemia.    Chronic bronchitis: he used to smoke but quit many years ago, he still has a cough that is productive at times, usually in the  mornings and it is a  pale yellow sputum, he denies wheezing  He has some sob with moderate activity    BPH: seen by [facility] and symptoms are stable, on Flomax but still has nocturia about 2-3 times per night . Stable    HTN:  he is compliant with mediations,  no chest pain,, he has some SOB with activity .He has lower extremity edema but mild , last visit he was having some dizziness and we decreased norvasc from 10 mg to 7.5 mg today bp was still low and he is now on 5 mg dose and bp is good for him at 132/74    Morbid obese : BMI above 40, using a hiking stick and walks 30 minutes about 5 days a week,he eats healthier this time of the year due to the heat, weight is down 4 lbs.    History of Aortic valve stenosis/Asending aorta dilation: under the care of cardiologist - Dr. Mariah Milling,  no syncope or chest pain, had echo normal EF. 55-60  %. He is now also seeing Dr. 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 He had echo done 05/2022  and ascending aorta dilation is stable.    Atherosclerosis of aorta/Hyperlipidemia : he is on  statin therapy, taking aspirin 81 mg daily  last LDL was up from 58 to 81 , explained importance of compliance to get LDL below 70, we will recheck labs next visit    History of iron deficiency anemia:  secondary to rectal bleeding, he has a personal history of hemorrhoids and diverticulosis, he was released from Dr. Servando Snare .He is still taking iron supplementation and denies pica or sob , last hct was at goal, we will recheck it next visit    Gout: taking allopurinol and not on diuretics  Last uric acid was normal . No recent flares .  Claudication lower extremity :  he states no longer walking one hour because he has to take multiple breaks. He has been walking again for 30 minutes no longer having to take a break every time he walks  He was seen by vascular surgeon and felt likely secondary from lumbar spine problems. Discussed risk and benefits of therapy for lumbar spine stenosis and since symptoms only when walking he will continue physical activity and stop to take breaks as needed .   Gait problems: he has OA on both knees he states symptoms have been stable, he uses a cane or walking  stick   Thrombocytopenia: last level was normal again but we will continue to monitor yearly since it goes up and down , recheck it yearly   ED: he states not using cialis due to cost, we will give him a Goodrx voucher to take to [facility]  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   History of angina 02/12/2021   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   PAD (peripheral artery disease) (HCC) 01/17/2020   Diverticulosis of colon with hemorrhage 05/26/2019   Atherosclerosis of aorta (HCC) 04/26/2019   Rectal bleed 04/22/2019   Chronic bronchitis (HCC) 07/30/2018   Bilateral hydrocele 11/27/2015   Ventral hernia without obstruction or gangrene 11/27/2015   Controlled type 2 diabetes mellitus with microalbuminuria (HCC) 10/26/2015   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   Hyperlipidemia 11/07/2014   Essential hypertension 11/07/2014   Obesity, Class III, BMI 40-49.9 (morbid obesity) (HCC) 11/07/2014   Controlled gout 11/07/2014   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   Constipation 10/26/2012   Incisional hernia, without obstruction or gangrene 10/26/2012   Left inguinal hernia     Past Surgical History:  Procedure Laterality Date   cataract surgery     COLONOSCOPY  03/17/2010   COLONOSCOPY N/A 04/22/2019   Procedure: COLONOSCOPY;  Surgeon: Lemar Lofty., MD;  Location: [facility];  Service: Gastroenterology;  Laterality: N/A;   EYE SURGERY  03/17/2009   cataract   HERNIA REPAIR     INTRAOPERATIVE TRANSTHORACIC ECHOCARDIOGRAM N/A 05/14/2021   Procedure: INTRAOPERATIVE TRANSTHORACIC ECHOCARDIOGRAM;  Surgeon: Tonny Bollman, MD;  Location: [facility] INVAS[facility] CV LAB;  Service: Open Heart Surgery;  Laterality: N/A;   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   Procedure: PACEMAKER IMPLANT;  Surgeon: Marinus Maw, MD;  Location: [facility] INVASIVE CV LAB;  Service: Cardiovascular;  Laterality: N/A;   RIGHT HEART CATH AND CORONARY ANGIOGRAPHY Bilateral 02/15/2021   Procedure: RIGHT HEART CATH AND CORONARY ANGIOGRAPHY;  Surgeon: Yvonne Kendall, MD;  Location: [facility] INVASIVE CV LAB;  Service: Cardiovascular;  Laterality: Bilateral;   TRANSCATHETER AORTIC VALVE REPLACEMENT, TRANSFEMORAL N/A 05/14/2021   Procedure: Transcatheter Aortic  Valve Replacement, Transfemoral;  Surgeon: Tonny Bollman, MD;  Location: [facility] INVASIVE CV LAB;  Service: Open Heart Surgery;  Laterality: N/A;   UMBILICAL HERNIA REPAIR  03/17/2009    Family History  Problem Relation Age of Onset   Heart disease Father    Hypertension Father    Diabetes Sister    Diabetes Brother    Prostate cancer Brother    Diabetes Sister    Kidney disease Neg Hx    Kidney cancer Neg Hx    Bladder Cancer Neg Hx    Colon cancer Neg Hx    Esophageal cancer Neg Hx    Inflammatory bowel disease Neg Hx    Liver disease Neg Hx    Pancreatic cancer Neg Hx    Rectal cancer Neg Hx    Stomach cancer Neg Hx     Social History   Tobacco  Use   Smoking status: Former    Packs/day: 1.00    Years: 10.00    Additional pack years: 0.00    Total pack years: 10.00    Types: Cigarettes    Start date: 03/17/1965    Quit date: 11/27/1975    Years since quitting: 46.8   Smokeless tobacco: Never   Tobacco comments:    quit 40 years  Substance Use Topics   Alcohol use: Yes    Alcohol/week: 1.0 standard drink of alcohol    Types: 1 Standard drinks or equivalent per week    Comment: socially - 1 x year     Current Outpatient Medications:    ACCU-CHEK AVIVA PLUS test strip, TEST TWO TIMES DAILY AS NEEDED AS DIRECTED, Disp: 200 strip, Rfl: 3   acetaminophen (TYLENOL) 325 MG tablet, Take 2 tablets (650 mg total) by mouth every 4 (four) hours as needed for headache or mild pain., Disp: , Rfl:    acetaminophen-codeine (TYLENOL #3) 300-30 MG tablet, Take 1 tablet by mouth every 6 (six) hours as needed., Disp: , Rfl:    Alcohol Swabs (B-D SINGLE USE SWABS REGULAR) PADS, 1 each by Does not apply route as needed., Disp: 100 each, Rfl: 3   allopurinol (ZYLOPRIM) 300 MG tablet, TAKE 1 TABLET EVERY DAY, Disp: 90 tablet, Rfl: 2   aspirin EC 81 MG tablet, Take 1 tablet (81 mg total) by mouth daily. Swallow whole., Disp: , Rfl:    Cholecalciferol (VITAMIN D3 PO), Take 1 tablet by mouth  every Monday, Wednesday, and Friday., Disp: , Rfl:    ferrous sulfate 325 (65 FE) MG tablet, Take 325 mg by mouth daily., Disp: , Rfl:    furosemide (LASIX) 20 MG tablet, TAKE 1 TABLET EVERY DAY, Disp: 90 tablet, Rfl: 3   latanoprost (XALATAN) 0.005 % ophthalmic solution, Place 1 drop into the left eye at bedtime. , Disp: , Rfl:    Omega-3 Fatty Acids (FISH OIL PO), Take 1 capsule by mouth 2 (two) times daily. , Disp: , Rfl:    polyethylene glycol (MIRALAX / GLYCOLAX) packet, Take 17 g by mouth at bedtime. , Disp: , Rfl:    TRUEplus Lancets 33G MISC, CHECK BLOOD GLUCOSE TWO TIMES DAILY, Disp: 200 each, Rfl: 3   amLODipine (NORVASC) 5 MG tablet, Take 1 tablet (5 mg total) by mouth daily., Disp: 90 tablet, Rfl: 0   atorvastatin (LIPITOR) 80 MG tablet, Take 1 tablet (80 mg total) by mouth at bedtime., Disp: 90 tablet, Rfl: 0   Blood Glucose Calibration (TRUE METRIX LEVEL 1) Low SOLN, 1 Bottle by In Vitro route once for 1 dose., Disp: 1 each, Rfl: 0   Blood Glucose Monitoring Suppl (TRUE METRIX AIR GLUCOSE METER) DEVI, 1 each by Does not apply route once for 1 dose., Disp: 1 each, Rfl: 0   glipiZIDE (GLUCOTROL XL) 5 MG 24 hr tablet, Take 1 tablet (5 mg total) by mouth daily with breakfast., Disp: 90 tablet, Rfl: 0   metFORMIN (GLUCOPHAGE-XR) 750 MG 24 hr tablet, Take 2 tablets (1,500 mg total) by mouth daily with breakfast., Disp: 180 tablet, Rfl: 0   pioglitazone (ACTOS) 15 MG tablet, Take 1 tablet (15 mg total) by mouth daily., Disp: 90 tablet, Rfl: 0   tadalafil (CIALIS) 20 MG tablet, Take 1 tablet (20 mg total) by mouth every three (3) days as needed for erectile dysfunction., Disp: 30 tablet, Rfl: 0   tamsulosin (FLOMAX) 0.4 MG CAPS capsule, Take 1 capsule (0.4 mg  total) by mouth daily., Disp: 90 capsule, Rfl: 0  Allergies  Allergen Reactions   Ace Inhibitors Other (See Comments)    angioedema    I personally reviewed active problem list, medication list, allergies, family history, social  history, health maintenance with the patient/caregiver today.   ROS  Ten systems reviewed and is negative except as mentioned in HPI   Objective  Vitals:   09/24/22 1334  BP: 132/74  Pulse: 94  Resp: 20  Temp: 98 F (36.7 C)  TempSrc: Oral  SpO2: 100%  Weight: (!) 303 lb 4.8 oz (137.6 kg)  Height: 5\' 9"  (1.753 m)    Body mass index is 44.79 kg/m.  Physical Exam  Constitutional: Patient appears well-developed and well-nourished. Obese  No distress.  HEENT: head atraumatic, normocephalic, pupils equal and reactive to light, ears normal TM, neck supple, throat within normal limits Cardiovascular: Normal rate, regular rhythm and normal heart sounds.  No murmur heard. Trace  BLE edema. Pulmonary/Chest: Effort normal and breath sounds normal. No respiratory distress. Abdominal: Soft.  There is no tenderness. Psychiatric: Patient has a normal mood and affect. behavior is normal. Judgment and thought content normal.   Recent Results (from the past 2160 hour(s))  POCT HgB A1C     Status: Abnormal   Collection Time: 09/24/22  1:36 PM  Result Value Ref Range   Hemoglobin A1C 7.2 (A) 4.0 - 5.6 %   HbA1c POC (<> result, manual entry)     HbA1c, POC (prediabetic range)     HbA1c, POC (controlled diabetic range)       PHQ2/9:    09/24/2022    1:36 PM 05/20/2022    2:01 PM 02/11/2022    9:44 AM 12/30/2021    1:50 PM 08/27/2021    1:44 PM  Depression screen PHQ 2/9  Decreased Interest 0 0 0 0 0  Down, Depressed, Hopeless 0 0 0 0 0  PHQ - 2 Score 0 0 0 0 0  Altered sleeping 0 0  0 0  Tired, decreased energy 0 0  0 0  Change in appetite 0 0  0 0  Feeling bad or failure about yourself  0 0  0 0  Trouble concentrating 0 0  0 0  Moving slowly or fidgety/restless 0 0  0 0  Suicidal thoughts 0 0  0 0  PHQ-9 Score 0 0  0 0    phq 9 is negative   Fall Risk:    09/24/2022    1:35 PM 05/20/2022    2:01 PM 02/11/2022    9:44 AM 12/30/2021    1:50 PM 08/27/2021    1:44 PM  Fall  Risk   Falls in the past year? 0 0 0 0 0  Number falls in past yr:   0 0 0  Injury with Fall?   0 0 0  Risk for fall due to : Impaired balance/gait Impaired balance/gait No Fall Risks No Fall Risks No Fall Risks;Impaired balance/gait  Follow up Falls prevention discussed;Education provided;Falls evaluation completed Falls prevention discussed;Education provided;Falls evaluation completed Falls evaluation completed Falls prevention discussed Falls prevention discussed      Functional Status Survey: Is the patient deaf or have difficulty hearing?: No Does the patient have difficulty seeing, even when wearing glasses/contacts?: No Does the patient have difficulty concentrating, remembering, or making decisions?: No Does the patient have difficulty walking or climbing stairs?: Yes Does the patient have difficulty dressing or bathing?: No Does the patient have  difficulty doing errands alone such as visiting a doctor's office or shopping?: No    Assessment & Plan  1. Controlled type 2 diabetes mellitus with microalbuminuria, without long-term current use of insulin (HCC)  - POCT HgB A1C - glipiZIDE (GLUCOTROL XL) 5 MG 24 hr tablet; Take 1 tablet (5 mg total) by mouth daily with breakfast.  Dispense: 90 tablet; Refill: 0 - metFORMIN (GLUCOPHAGE-XR) 750 MG 24 hr tablet; Take 2 tablets (1,500 mg total) by mouth daily with breakfast.  Dispense: 180 tablet; Refill: 0 - pioglitazone (ACTOS) 15 MG tablet; Take 1 tablet (15 mg total) by mouth daily.  Dispense: 90 tablet; Refill: 0  2. Atherosclerosis of aorta (HCC)  - atorvastatin (LIPITOR) 80 MG tablet; Take 1 tablet (80 mg total) by mouth at bedtime.  Dispense: 90 tablet; Refill: 0  3. Hypertension associated with diabetes (HCC)  - pioglitazone (ACTOS) 15 MG tablet; Take 1 tablet (15 mg total) by mouth daily.  Dispense: 90 tablet; Refill: 0  4. Simple chronic bronchitis (HCC)  stable  5. 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HCC)  Recent Echo done  by cardiologist mild dilation  6. Thrombocytopenia (HCC)  Recheck next visit  7. Claudication of both lower extremities (HCC)  Seems to have improved with activity   8. History of iron deficiency anemia  He is still taking iron pills every other day   9. Mixed hyperlipidemia  - atorvastatin (LIPITOR) 80 MG tablet; Take 1 tablet (80 mg total) by mouth at bedtime.  Dispense: 90 tablet; Refill: 0  10. Benign prostatic hyperplasia with nocturia  - tamsulosin (FLOMAX) 0.4 MG CAPS capsule; Take 1 capsule (0.4 mg total) by mouth daily.  Dispense: 90 capsule; Refill: 0  11. ED (erectile dysfunction) of organic origin  - tadalafil (CIALIS) 20 MG tablet; Take 1 tablet (20 mg total) by mouth every three (3) days as needed for erectile dysfunction.  Dispense: 30 tablet; Refill: 0

## 2022-09-24 ENCOUNTER — Encounter: Payer: Self-pay | Admitting: Family Medicine

## 2022-09-24 ENCOUNTER — Ambulatory Visit: Payer: Medicare HMO | Admitting: Family Medicine

## 2022-09-24 VITALS — BP 132/74 | HR 94 | Temp 98.0°F | Resp 20 | Ht 69.0 in | Wt 303.3 lb

## 2022-09-24 DIAGNOSIS — E1129 Type 2 diabetes mellitus with other diabetic kidney complication: Secondary | ICD-10-CM | POA: Diagnosis not present

## 2022-09-24 DIAGNOSIS — Z7984 Long term (current) use of oral hypoglycemic drugs: Secondary | ICD-10-CM

## 2022-09-24 DIAGNOSIS — D696 Thrombocytopenia, unspecified: Secondary | ICD-10-CM

## 2022-09-24 DIAGNOSIS — N401 Enlarged prostate with lower urinary tract symptoms: Secondary | ICD-10-CM

## 2022-09-24 DIAGNOSIS — I152 Hypertension secondary to endocrine disorders: Secondary | ICD-10-CM

## 2022-09-24 DIAGNOSIS — Z862 Personal history of diseases of the blood and blood-forming organs and certain disorders involving the immune mechanism: Secondary | ICD-10-CM | POA: Diagnosis not present

## 2022-09-24 DIAGNOSIS — I700 Atherosclerosis of aorta: Secondary | ICD-10-CM | POA: Diagnosis not present

## 2022-09-24 DIAGNOSIS — E782 Mixed hyperlipidemia: Secondary | ICD-10-CM

## 2022-09-24 DIAGNOSIS — E1159 Type 2 diabetes mellitus with other circulatory complications: Secondary | ICD-10-CM

## 2022-09-24 DIAGNOSIS — R809 Proteinuria, unspecified: Secondary | ICD-10-CM

## 2022-09-24 DIAGNOSIS — I739 Peripheral vascular disease, unspecified: Secondary | ICD-10-CM | POA: Diagnosis not present

## 2022-09-24 DIAGNOSIS — N529 Male erectile dysfunction, unspecified: Secondary | ICD-10-CM

## 2022-09-24 DIAGNOSIS — I77810 Thoracic aortic ectasia: Secondary | ICD-10-CM

## 2022-09-24 DIAGNOSIS — J410 Simple chronic bronchitis: Secondary | ICD-10-CM

## 2022-09-24 LAB — POCT GLYCOSYLATED HEMOGLOBIN (HGB A1C): Hemoglobin A1C: 7.2 % — AB (ref 4.0–5.6)

## 2022-09-24 MED ORDER — AMLODIPINE BESYLATE 5 MG PO TABS: 5.0000 mg | ORAL_TABLET | Freq: Every day | ORAL | 0 refills | Status: DC

## 2022-09-24 MED ORDER — TADALAFIL 20 MG PO TABS
20.0000 mg | ORAL_TABLET | ORAL | 0 refills | Status: DC | PRN
Start: 2022-09-24 — End: 2023-02-10

## 2022-09-24 MED ORDER — PIOGLITAZONE HCL 15 MG PO TABS: 15.0000 mg | ORAL_TABLET | Freq: Every day | ORAL | 0 refills | Status: DC

## 2022-09-24 MED ORDER — GLIPIZIDE ER 5 MG PO TB24: 5.0000 mg | ORAL_TABLET | Freq: Every day | ORAL | 0 refills | Status: DC

## 2022-09-24 MED ORDER — TAMSULOSIN HCL 0.4 MG PO CAPS: 0.4000 mg | ORAL_CAPSULE | Freq: Every day | ORAL | 0 refills | Status: DC

## 2022-09-24 MED ORDER — ATORVASTATIN CALCIUM 80 MG PO TABS
80.0000 mg | ORAL_TABLET | Freq: Every day | ORAL | 0 refills | Status: DC
Start: 2022-09-24 — End: 2023-02-10

## 2022-09-24 MED ORDER — METFORMIN HCL ER 750 MG PO TB24: 1500.0000 mg | ORAL_TABLET | Freq: Every day | ORAL | 0 refills | Status: DC

## 2022-10-25 ENCOUNTER — Other Ambulatory Visit: Payer: Self-pay | Admitting: Family Medicine

## 2022-11-19 ENCOUNTER — Ambulatory Visit (INDEPENDENT_AMBULATORY_CARE_PROVIDER_SITE_OTHER): Payer: Medicare HMO

## 2022-11-19 DIAGNOSIS — I442 Atrioventricular block, complete: Secondary | ICD-10-CM | POA: Diagnosis not present

## 2022-11-20 LAB — CUP PACEART REMOTE DEVICE CHECK
Battery Remaining Longevity: 166 mo
Battery Voltage: 3.06 V
Brady Statistic AP VP Percent: 0.01 %
Brady Statistic AP VS Percent: 5.13 %
Brady Statistic AS VP Percent: 0.03 %
Brady Statistic AS VS Percent: 94.83 %
Brady Statistic RA Percent Paced: 5.18 %
Brady Statistic RV Percent Paced: 0.04 %
Date Time Interrogation Session: 20240903214914
Implantable Lead Connection Status: 753985
Implantable Lead Connection Status: 753985
Implantable Lead Implant Date: 20230307
Implantable Lead Implant Date: 20230307
Implantable Lead Location: 753859
Implantable Lead Location: 753860
Implantable Lead Model: 3830
Implantable Lead Model: 5076
Implantable Pulse Generator Implant Date: 20230307
Lead Channel Impedance Value: 323 Ohm
Lead Channel Impedance Value: 380 Ohm
Lead Channel Impedance Value: 399 Ohm
Lead Channel Impedance Value: 570 Ohm
Lead Channel Pacing Threshold Amplitude: 0.625 V
Lead Channel Pacing Threshold Amplitude: 1.125 V
Lead Channel Pacing Threshold Pulse Width: 0.4 ms
Lead Channel Pacing Threshold Pulse Width: 0.4 ms
Lead Channel Sensing Intrinsic Amplitude: 16.625 mV
Lead Channel Sensing Intrinsic Amplitude: 16.625 mV
Lead Channel Sensing Intrinsic Amplitude: 4.25 mV
Lead Channel Sensing Intrinsic Amplitude: 4.25 mV
Lead Channel Setting Pacing Amplitude: 1.5 V
Lead Channel Setting Pacing Amplitude: 2.25 V
Lead Channel Setting Pacing Pulse Width: 0.4 ms
Lead Channel Setting Sensing Sensitivity: 1.2 mV
Zone Setting Status: 755011
Zone Setting Status: 755011

## 2022-11-20 NOTE — Progress Notes (Signed)
Cardiology Office Note Date:  11/20/2022  Patient ID:  Jordan, [name] 10-20-1940, MRN [id number] PCP:  Alba Cory, MD  Cardiologist:  Julien Nordmann, MD Electrophysiologist: Lanier Prude, MD    Chief Complaint: tachycardia episodes on PPM  History of Present Illness: Jordan [name] is a 82 y.o. male with PMH notable for severe aortic stenosis s/p TAVR, post-procedurally CHB s/p PPM, CAD, PAD, HTN, T2DM, former smoker, HLD, ascending aorta dilation; seen today for Lanier Prude, MD for concerning tachycardia on remote device transmission.  He was last seen by Dr. Lalla Brothers 08/2021 for routine 91d post- PPM implant. AV conduction was intact. He saw Dr. Mariah Milling 07/2022 for routine follow-up. Feeling well, HTN well-controlled. A1C elevated, rec lifestyle modificiations  His remote transmission from yesterday showed SVT and NSVT rhythms, so patient was scheduled for clinic visit for further evaluation.  Since last being seen in our clinic the patient reports doing very well. He has no chest pain, chest pressure. Denies palpitations, SOB. He walks at the [facility] 30 minutes per day, 5 days a week. He also uses a stationary floor bike several days a week. He has no concerning symptoms while exercising. He is limited to 30 minutes of walking because of knee pain.  He does have lower extremity edema, says it comes and goes. He has good appetite, no early satiety .   Device Information: MDT dual chamber PPM, imp 05/2021; dx CHB   Past Medical History:  Diagnosis Date   Aortic stenosis    Chronic kidney disease, stage II (mild)    Decreased libido    Diabetes mellitus without complication (HCC)    Glaucoma    Gout 2009   Heart murmur    Hernia 2841,3244   Hyperlipidemia    Hypertension    Inguinal hernia without mention of obstruction or gangrene, unilateral or unspecified, (not specified as recurrent)    Lumbago    Obesity    S/P TAVR (transcatheter aortic valve  replacement) 05/14/2021   s/p TAVR with a 29 mm Edwards S3UR via the TF approach by Dr. Excell Seltzer and Dr. Laneta Simmers   Unspecified constipation     Past Surgical History:  Procedure Laterality Date   cataract surgery     COLONOSCOPY  03/17/2010   COLONOSCOPY N/A 04/22/2019   Procedure: COLONOSCOPY;  Surgeon: Lemar Lofty., MD;  Location: [facility];  Service: Gastroenterology;  Laterality: N/A;   EYE SURGERY  03/17/2009   cataract   HERNIA REPAIR     INTRAOPERATIVE TRANSTHORACIC ECHOCARDIOGRAM N/A 05/14/2021   Procedure: INTRAOPERATIVE TRANSTHORACIC ECHOCARDIOGRAM;  Surgeon: Tonny Bollman, MD;  Location: [facility] INVASIVE CV LAB;  Service: Open Heart Surgery;  Laterality: N/A;   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   Procedure: PACEMAKER IMPLANT;  Surgeon: Marinus Maw, MD;  Location: [facility] INVASIVE CV LAB;  Service: Cardiovascular;  Laterality: N/A;   RIGHT HEART CATH AND CORONARY ANGIOGRAPHY Bilateral 02/15/2021   Procedure: RIGHT HEART CATH AND CORONARY ANGIOGRAPHY;  Surgeon: Yvonne Kendall, MD;  Location: [facility] INVASIVE CV LAB;  Service: Cardiovascular;  Laterality: Bilateral;   TRANSCATHETER AORTIC VALVE REPLACEMENT, TRANSFEMORAL N/A 05/14/2021   Procedure: Transcatheter Aortic Valve Replacement, Transfemoral;  Surgeon: Tonny Bollman, MD;  Location: [facility] INVASIVE CV LAB;  Service: Open Heart Surgery;  Laterality: N/A;   UMBILICAL HERNIA REPAIR  03/17/2009    Current Outpatient Medications  Medication Instructions   acetaminophen (TYLENOL) 650 mg, Oral, Every 4 hours PRN   acetaminophen-codeine (TYLENOL #3) 300-30 MG tablet 1  tablet, Oral, Every 6 hours PRN   Alcohol Swabs (B-D SINGLE USE SWABS REGULAR) PADS 1 each, Does not apply, As needed   allopurinol (ZYLOPRIM) 300 mg, Oral, Daily   amLODipine (NORVASC) 5 mg, Oral, Daily   aspirin EC 81 mg, Oral, Daily, Swallow whole.   atorvastatin (LIPITOR) 80 mg, Oral, Daily at bedtime   Blood Glucose Calibration (TRUE METRIX LEVEL 1) Low SOLN  1 Bottle, In Vitro,  Once   Blood Glucose Monitoring Suppl (TRUE METRIX AIR GLUCOSE METER) DEVI 1 each, Does not apply,  Once   Cholecalciferol (VITAMIN D3 PO) 1 tablet, Oral, Every M-W-F   ferrous sulfate 325 mg, Oral, Daily   furosemide (LASIX) 20 mg, Oral, Daily   glipiZIDE (GLUCOTROL XL) 5 mg, Oral, Daily with breakfast   latanoprost (XALATAN) 0.005 % ophthalmic solution 1 drop, Left Eye, Daily at bedtime   metFORMIN (GLUCOPHAGE-XR) 1,500 mg, Oral, Daily with breakfast   Omega-3 Fatty Acids (FISH OIL PO) 1 capsule, Oral, 2 times daily   pioglitazone (ACTOS) 15 mg, Oral, Daily   polyethylene glycol (MIRALAX / GLYCOLAX) 17 g, Oral, Daily at bedtime   tadalafil (CIALIS) 20 mg, Oral, Every 3 DAYS PRN   tamsulosin (FLOMAX) 0.4 mg, Oral, Daily   TRUE METRIX BLOOD GLUCOSE TEST test strip CHECK BLOOD SUGAR AS DIRECTED   TRUEplus Lancets 33G MISC CHECK BLOOD GLUCOSE TWO TIMES DAILY    Social History:  The patient  reports that he quit smoking about 47 years ago. His smoking use included cigarettes. He started smoking about 57 years ago. He has a 10.7 pack-year smoking history. He has never used smokeless tobacco. He reports current alcohol use of about 1.0 standard drink of alcohol per week. He reports that he does not use drugs.   Family History:   The patient's family history includes Diabetes in his brother, sister, and sister; Heart disease in his father; Hypertension in his father; Prostate cancer in his brother.  ROS:  Please see the history of present illness. All other systems are reviewed and otherwise negative.   PHYSICAL EXAM:  VS:  BP 127/68 (BP Location: Left Arm, Patient Position: Sitting, Cuff Size: Large)   Pulse 88   Ht 5\' 9"  (1.753 m)   Wt (!) 308 lb 2 oz (139.8 kg)   SpO2 96%   BMI 45.50 kg/m  BMI: Body mass index is 45.5 kg/m.  GEN- The patient is well appearing, alert and oriented x 3 today.   Lungs- Clear to ausculation bilaterally, normal work of breathing.   Heart- Regular rate and rhythm, no murmurs, rubs or gallops Extremities- Trace peripheral edema, warm, dry Skin-  device pocket well-healed, no tethering   Device interrogation done today and reviewed by myself:  Battery 13.8 years Lead thresholds, impedence, sensing stable  Freq SVT episodes, longest 9 minutes Few NSVT episodes, longest 7 seconds Rare VP No changes made today  EKG is ordered. Personal review of EKG from today shows:    EKG Interpretation Date/Time:  Friday November 21 2022 13:28:30 EDT Ventricular Rate:  88 PR Interval:  196 QRS Duration:  148 QT Interval:  384 QTC Calculation: 464 R Axis:   15  Text Interpretation: Normal sinus rhythm Right bundle branch block Confirmed by Sherie Don [phone]) on 11/21/2022 1:31:14 PM    Recent Labs: 12/30/2021: ALT 16; BUN 18; Creat 0.97; Hemoglobin 13.8; Platelets 177; Potassium 5.2; Sodium 135  12/30/2021: Cholesterol 143; HDL 43; LDL Cholesterol (Calc) 81; Total CHOL/HDL Ratio 3.3; Triglycerides  96   CrCl cannot be calculated (Patient's most recent lab result is older than the maximum 21 days allowed.).   Wt Readings from Last 3 Encounters:  09/24/22 (!) 303 lb 4.8 oz (137.6 kg)  07/28/22 (!) 307 lb 6 oz (139.4 kg)  05/20/22 (!) 308 lb (139.7 kg)     Additional studies reviewed include: Previous EP, cardiology notes.   TTE, 05/19/2022  1. Left ventricular ejection fraction, by estimation, is 55 to 60%. The left ventricle has normal function. The left ventricle has no regional wall motion abnormalities. There is mild left ventricular hypertrophy. Left ventricular diastolic parameters are consistent with Grade I diastolic dysfunction (impaired relaxation).   2. Right ventricular systolic function is normal. The right ventricular size is normal. Tricuspid regurgitation signal is inadequate for assessing PA pressure.   3. The mitral valve is normal in structure. Trivial mitral valve regurgitation. No evidence of mitral  stenosis.   4. The aortic valve has been repaired/replaced. Aortic valve regurgitation is trivial. There is a 29 mm Edwards Sapien prosthetic (TAVR) valve present in the aortic position. Echo findings are consistent with normal structure and function of the aortic  valve prosthesis. Aortic valve mean gradient measures 9.0 mmHg.   5. Aortic dilatation noted. There is mild dilatation of the ascending aorta, measuring 39 mm.   TTE, 06/26/2021 1. Left ventricular ejection fraction, by estimation, is 55 to 60%. The left ventricle has normal function. The left ventricle has no regional wall motion abnormalities. There is mild concentric left ventricular hypertrophy. Left ventricular diastolic parameters are consistent with Grade I diastolic dysfunction (impaired relaxation).   2. Right ventricular systolic function is normal. The right ventricular size is normal. Tricuspid regurgitation signal is inadequate for assessing PA pressure.   3. Left atrial size was moderately dilated.   4. The mitral valve is normal in structure. Trivial mitral valve regurgitation. No evidence of mitral stenosis.   5. The aortic valve has been repaired/replaced. Aortic valve regurgitation is not visualized. No aortic stenosis is present. There is a 29 mm Sapien prosthetic (TAVR) valve present in the aortic position. Procedure Date: 05/14/21. Aortic valve mean gradient measures 10.2 mmHg. Aortic valve Vmax measures 2.20 m/s. Aortic valve acceleration time measures 85 msec.   6. Aortic dilatation noted. There is mild dilatation of the ascending aorta, measuring 44 mm. There is borderline dilatation of the aortic root, measuring 40 mm.   7. The inferior vena cava is normal in size with greater than 50% respiratory variability, suggesting right atrial pressure of 3 mmHg.   Comparison(s): No significant change from prior study. Prior images reviewed side by side. 05/15/21 EF 60-65%. AV peak PG, mean PG.   R/LHC  02/15/2021 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  Otherwise, there is mild-moderate, non-obstructive coronary artery disease, as detailed below. Mildly elevated left heart and pulmonary artery pressures (PCWP 22 mmHg, mean PAP 26 mmHg). Severely elevated right heart filling pressure (mean RAP 16 mmHg, RVEDP 16 mmHg). Normal Fick cardiac output/index (CO 6.1 L/min, CI 2.5 L/min/m^2).  ASSESSMENT AND PLAN:  #) NSVT #) SVT Frequent arrhythmia episodes by device No symptoms during episodes No decreased exercise tolerance Update echo Update CBC, BMP, mag, TSH Start 25mg  toprol nightly  #) intermittent CHB #) MDT dual chamber PPM in situ Device functioning well, see paceart for details Rare VP       Current medicines are reviewed at length with the patient  today.   The patient does not have concerns regarding his medicines.  The following changes were made today:   START 25mg  toprol daily  Labs/ tests ordered today include:  Orders Placed This Encounter  Procedures   CBC   Basic metabolic panel   Magnesium   TSH   EKG 12-Lead   ECHOCARDIOGRAM COMPLETE     Disposition: Follow up with EP APP in 3 months   Signed, Sherie Don, NP  11/20/22  1:39 PM  Electrophysiology [facility]

## 2022-11-21 ENCOUNTER — Encounter: Payer: Self-pay | Admitting: Cardiology

## 2022-11-21 ENCOUNTER — Ambulatory Visit: Payer: Medicare HMO | Attending: Cardiology | Admitting: Cardiology

## 2022-11-21 VITALS — BP 127/68 | HR 88 | Ht 69.0 in | Wt 308.1 lb

## 2022-11-21 DIAGNOSIS — I472 Ventricular tachycardia, unspecified: Secondary | ICD-10-CM | POA: Diagnosis not present

## 2022-11-21 DIAGNOSIS — Z950 Presence of cardiac pacemaker: Secondary | ICD-10-CM | POA: Diagnosis not present

## 2022-11-21 DIAGNOSIS — I10 Essential (primary) hypertension: Secondary | ICD-10-CM | POA: Diagnosis not present

## 2022-11-21 DIAGNOSIS — I442 Atrioventricular block, complete: Secondary | ICD-10-CM | POA: Diagnosis not present

## 2022-11-21 DIAGNOSIS — I471 Supraventricular tachycardia, unspecified: Secondary | ICD-10-CM | POA: Diagnosis not present

## 2022-11-21 DIAGNOSIS — I700 Atherosclerosis of aorta: Secondary | ICD-10-CM | POA: Diagnosis not present

## 2022-11-21 DIAGNOSIS — I77810 Thoracic aortic ectasia: Secondary | ICD-10-CM | POA: Diagnosis not present

## 2022-11-21 DIAGNOSIS — E782 Mixed hyperlipidemia: Secondary | ICD-10-CM | POA: Diagnosis not present

## 2022-11-21 LAB — CUP PACEART INCLINIC DEVICE CHECK
Date Time Interrogation Session: 20240906154208
Implantable Lead Connection Status: 753985
Implantable Lead Connection Status: 753985
Implantable Lead Implant Date: 20230307
Implantable Lead Implant Date: 20230307
Implantable Lead Location: 753859
Implantable Lead Location: 753860
Implantable Lead Model: 3830
Implantable Lead Model: 5076
Implantable Pulse Generator Implant Date: 20230307

## 2022-11-21 MED ORDER — METOPROLOL SUCCINATE ER 25 MG PO TB24: 25.0000 mg | ORAL_TABLET | Freq: Every day | ORAL | 3 refills | Status: DC

## 2022-11-21 NOTE — Patient Instructions (Addendum)
Medication Instructions:  START Metoprolol Succinate 25 mg once daily   *If you need a refill on your cardiac medications before your next appointment, please call your pharmacy*   Lab Work: Your provider would like for you to have following labs drawn today CBC, BMET, MAG, TSH.   If you have labs (blood work) drawn today and your tests are completely normal, you will receive your results only by: MyChart Message (if you have MyChart) OR A paper copy in the mail If you have any lab test that is abnormal or we need to change your treatment, we will call you to review the results.   Testing/Procedures: Your physician has requested that you have an echocardiogram. Echocardiography is a painless test that uses sound waves to create images of your heart. It provides your doctor with information about the size and shape of your heart and how well your heart's chambers and valves are working.   You may receive an ultrasound enhancing agent through an IV if needed to better visualize your heart during the echo. This procedure takes approximately one hour.  There are no restrictions for this procedure.  This will take place at [address], [place] [address]    Follow-Up: At [facility] you and your health needs are our priority.  As part of our continuing mission to provide you with exceptional heart care, we have created designated Provider Care Teams.  These Care Teams include your primary Cardiologist (physician) and Advanced Practice Providers (APPs -  Physician Assistants and Nurse Practitioners) who all work together to provide you with the care you need, when you need it.  We recommend signing up for the patient portal called "MyChart".  Sign up information is provided on this After Visit Summary.  MyChart is used to connect with patients for Virtual Visits (Telemedicine).  Patients are able to view lab/test results, encounter notes, upcoming  appointments, etc.  Non-urgent messages can be sent to your provider as well.   To learn more about what you can do with MyChart, go to [email or web address].    Your next appointment:   2-3 month(s)  Provider:   Sherie Don, NP

## 2022-11-22 LAB — MAGNESIUM: Magnesium: 1.7 mg/dL (ref 1.6–2.3)

## 2022-11-22 LAB — BASIC METABOLIC PANEL
BUN/Creatinine Ratio: 16 (ref 10–24)
BUN: 15 mg/dL (ref 8–27)
CO2: 23 mmol/L (ref 20–29)
Calcium: 9.8 mg/dL (ref 8.6–10.2)
Chloride: 100 mmol/L (ref 96–106)
Creatinine, Ser: 0.95 mg/dL (ref 0.76–1.27)
Glucose: 206 mg/dL — ABNORMAL HIGH (ref 70–99)
Potassium: 4.3 mmol/L (ref 3.5–5.2)
Sodium: 136 mmol/L (ref 134–144)
eGFR: 80 mL/min/{1.73_m2} (ref >59)

## 2022-11-22 LAB — TSH: TSH: 1.4 u[IU]/mL (ref 0.450–4.500)

## 2022-11-22 LAB — CBC
Hematocrit: 37.4 % — ABNORMAL LOW (ref 37.5–51.0)
Hemoglobin: 12.4 g/dL — ABNORMAL LOW (ref 13.0–17.7)
MCH: 29.5 pg (ref 26.6–33.0)
MCHC: 33.2 g/dL (ref 31.5–35.7)
MCV: 89 fL (ref 79–97)
Platelets: 172 10*3/uL (ref 150–450)
RBC: 4.2 x10E6/uL (ref 4.14–5.80)
RDW: 14.9 % (ref 11.6–15.4)
WBC: 6 10*3/uL (ref 3.4–10.8)

## 2022-12-03 NOTE — Progress Notes (Signed)
Remote pacemaker transmission.   

## 2022-12-04 ENCOUNTER — Ambulatory Visit (INDEPENDENT_AMBULATORY_CARE_PROVIDER_SITE_OTHER): Payer: Medicare HMO | Admitting: Podiatry

## 2022-12-04 DIAGNOSIS — Z91199 Patient's noncompliance with other medical treatment and regimen due to unspecified reason: Secondary | ICD-10-CM

## 2022-12-04 NOTE — Progress Notes (Signed)
1. No-show for appointment     

## 2022-12-08 DIAGNOSIS — D49511 Neoplasm of unspecified behavior of right kidney: Secondary | ICD-10-CM | POA: Diagnosis not present

## 2022-12-12 ENCOUNTER — Ambulatory Visit: Payer: Medicare HMO | Attending: Cardiology

## 2022-12-12 DIAGNOSIS — Z950 Presence of cardiac pacemaker: Secondary | ICD-10-CM

## 2022-12-12 DIAGNOSIS — I471 Supraventricular tachycardia, unspecified: Secondary | ICD-10-CM | POA: Diagnosis not present

## 2022-12-12 DIAGNOSIS — I472 Ventricular tachycardia, unspecified: Secondary | ICD-10-CM | POA: Diagnosis not present

## 2022-12-12 DIAGNOSIS — I442 Atrioventricular block, complete: Secondary | ICD-10-CM | POA: Diagnosis not present

## 2022-12-12 LAB — ECHOCARDIOGRAM COMPLETE
AR max vel: 1.98 cm2
AV Area VTI: 1.88 cm2
AV Area mean vel: 1.88 cm2
AV Mean grad: 7.3 mm[Hg]
AV Peak grad: 13.5 mm[Hg]
Ao pk vel: 1.84 m/s
Area-P 1/2: 3.65 cm2
S' Lateral: 3.1 cm

## 2022-12-12 MED ORDER — PERFLUTREN LIPID MICROSPHERE
1.0000 mL | INTRAVENOUS | Status: AC | PRN
  Administered 2022-12-12: 2 mL via INTRAVENOUS

## 2023-01-01 DIAGNOSIS — K5730 Diverticulosis of large intestine without perforation or abscess without bleeding: Secondary | ICD-10-CM | POA: Diagnosis not present

## 2023-01-01 DIAGNOSIS — N281 Cyst of kidney, acquired: Secondary | ICD-10-CM | POA: Diagnosis not present

## 2023-01-01 DIAGNOSIS — Q631 Lobulated, fused and horseshoe kidney: Secondary | ICD-10-CM | POA: Diagnosis not present

## 2023-01-01 DIAGNOSIS — C641 Malignant neoplasm of right kidney, except renal pelvis: Secondary | ICD-10-CM | POA: Diagnosis not present

## 2023-01-01 DIAGNOSIS — D49511 Neoplasm of unspecified behavior of right kidney: Secondary | ICD-10-CM | POA: Diagnosis not present

## 2023-01-05 DIAGNOSIS — D49511 Neoplasm of unspecified behavior of right kidney: Secondary | ICD-10-CM | POA: Diagnosis not present

## 2023-01-15 DIAGNOSIS — H401122 Primary open-angle glaucoma, left eye, moderate stage: Secondary | ICD-10-CM | POA: Diagnosis not present

## 2023-01-15 DIAGNOSIS — H401111 Primary open-angle glaucoma, right eye, mild stage: Secondary | ICD-10-CM | POA: Diagnosis not present

## 2023-01-15 DIAGNOSIS — M3501 Sicca syndrome with keratoconjunctivitis: Secondary | ICD-10-CM | POA: Diagnosis not present

## 2023-01-15 DIAGNOSIS — E119 Type 2 diabetes mellitus without complications: Secondary | ICD-10-CM | POA: Diagnosis not present

## 2023-01-20 NOTE — Progress Notes (Deleted)
Cardiology Office Note Date:  01/20/2023  Patient ID:  Jordan, [name] 02-15-1941, MRN [id number] PCP:  Alba Cory, MD  Cardiologist:  Julien Nordmann, MD Electrophysiologist: Lanier Prude, MD    Chief Complaint: tachycardia episodes on PPM  History of Present Illness: Jordan [name] is a 82 y.o. male with PMH notable for severe aortic stenosis s/p TAVR, post-procedurally CHB s/p PPM, CAD, PAD, HTN, T2DM, former smoker, HLD, ascending aorta dilation; seen today for Lanier Prude, MD for routine EP follow-up  He was last seen by Dr. Lalla Brothers 08/2021 for routine 91d post- PPM implant. AV conduction was intact. He saw Dr. Mariah Milling 07/2022 for routine follow-up. Feeling well, HTN well-controlled. A1C elevated, rec lifestyle modificiations I saw him 11/2022 after his remote transmission showed SVT and NSVT rhythms. He was asymptomatic, exercising regularly. I started toprol.   On follow-up today,  *** palpitations *** SOB, exercise limitations  *** look at VP   Device Information: MDT dual chamber PPM, imp 05/2021; dx CHB   Past Medical History:  Diagnosis Date   Aortic stenosis    Chronic kidney disease, stage II (mild)    Decreased libido    Diabetes mellitus without complication (HCC)    Glaucoma    Gout 2009   Heart murmur    Hernia [id number]   Hyperlipidemia    Hypertension    Inguinal hernia without mention of obstruction or gangrene, unilateral or unspecified, (not specified as recurrent)    Lumbago    Obesity    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   s/p TAVR with a 29 mm Edwards S3UR via the TF approach by Dr. Excell Seltzer and Dr. Laneta Simmers   Unspecified constipation     Past Surgical History:  Procedure Laterality Date   cataract surgery     COLONOSCOPY  03/17/2010   COLONOSCOPY N/A 04/22/2019   Procedure: COLONOSCOPY;  Surgeon: Lemar Lofty., MD;  Location: [facility];  Service: Gastroenterology;  Laterality: N/A;    EYE SURGERY  03/17/2009   cataract   HERNIA REPAIR     INTRAOPERATIVE TRANSTHORACIC ECHOCARDIOGRAM N/A 05/14/2021   Procedure: INTRAOPERATIVE TRANSTHORACIC ECHOCARDIOGRAM;  Surgeon: Tonny Bollman, MD;  Location: [facility] INVASIVE CV LAB;  Service: Open Heart Surgery;  Laterality: N/A;   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   Procedure: PACEMAKER IMPLANT;  Surgeon: Marinus Maw, MD;  Location[facility] INVASIVE CV LAB;  Service: Cardiovascular;  Laterality: N/A;   RIGHT HEART CATH AND CORONARY ANGIOGRAPHY Bilateral 02/15/2021   Procedure: RIGHT HEART CATH AND CORONARY ANGIOGRAPHY;  Surgeon: Yvonne Kendall, MD;  Location: [facility] INVASIVE CV LAB;  Service: Cardiovascular;  Laterality: Bilateral;   TRANSCATHETER AORTIC VALVE REPLACEMENT, TRANSFEMORAL N/A 05/14/2021   Procedure: Transcatheter Aortic Valve Replacement, Transfemoral;  Surgeon: Tonny Bollman, MD;  Location: [facility] INVASIVE CV LAB;  Service: Open Heart Surgery;  Laterality: N/A;   UMBILICAL HERNIA REPAIR  03/17/2009    Current Outpatient Medications  Medication Instructions   acetaminophen (TYLENOL) 650 mg, Oral, Every 4 hours PRN   acetaminophen-codeine (TYLENOL #3) 300-30 MG tablet 1 tablet, Every 6 hours PRN   Alcohol Swabs (B-D SINGLE USE SWABS REGULAR) PADS 1 each, Does not apply, As needed   allopurinol (ZYLOPRIM) 300 mg, Oral, Daily   amLODipine (NORVASC) 5 mg, Oral, Daily   aspirin EC 81 mg, Oral, Daily, Swallow whole.   atorvastatin (LIPITOR) 80 mg, Oral, Daily at bedtime   Blood Glucose Calibration (TRUE METRIX LEVEL 1) Low SOLN 1 Bottle, In Vitro,  Once   Blood Glucose Monitoring Suppl (TRUE METRIX AIR GLUCOSE METER) DEVI 1 each, Does not apply,  Once   Cholecalciferol (VITAMIN D3 PO) 1 tablet, Oral, Every M-W-F   ferrous sulfate 325 mg, Oral, Daily   furosemide (LASIX) 20 mg, Oral, Daily   glipiZIDE (GLUCOTROL XL) 5 mg, Oral, Daily with breakfast   latanoprost (XALATAN) 0.005 % ophthalmic solution 1 drop, Left Eye, Daily at bedtime    metFORMIN (GLUCOPHAGE-XR) 1,500 mg, Oral, Daily with breakfast   metoprolol succinate (TOPROL XL) 25 mg, Oral, Daily   Omega-3 Fatty Acids (FISH OIL PO) 1 capsule, Oral, 2 times daily   pioglitazone (ACTOS) 15 mg, Oral, Daily   polyethylene glycol (MIRALAX / GLYCOLAX) 17 g, Oral, Daily at bedtime   tadalafil (CIALIS) 20 mg, Oral, Every 3 DAYS PRN   tamsulosin (FLOMAX) 0.4 mg, Oral, Daily   TRUE METRIX BLOOD GLUCOSE TEST test strip CHECK BLOOD SUGAR AS DIRECTED   TRUEplus Lancets 33G MISC CHECK BLOOD GLUCOSE TWO TIMES DAILY    Social History:  The patient  reports that he quit smoking about 47 years ago. His smoking use included cigarettes. He started smoking about 57 years ago. He has a 10.7 pack-year smoking history. He has never used smokeless tobacco. He reports current alcohol use of about 1.0 standard drink of alcohol per week. He reports that he does not use drugs.   Family History:   The patient's family history includes Diabetes in his brother, sister, and sister; Heart disease in his father; Hypertension in his father; Prostate cancer in his brother.  ROS:  Please see the history of present illness. All other systems are reviewed and otherwise negative.   PHYSICAL EXAM:  VS:  There were no vitals taken for this visit. BMI: There is no height or weight on file to calculate BMI.  GEN- The patient is well appearing, alert and oriented x 3 today.   Lungs- Clear to ausculation bilaterally, normal work of breathing.  Heart- Regular rate and rhythm, no murmurs, rubs or gallops Extremities- Trace peripheral edema, warm, dry Skin-  device pocket well-healed, no tethering   Device interrogation done today and reviewed by myself:  Battery 13.8 years Lead thresholds, impedence, sensing stable  Freq SVT episodes, longest 9 minutes Few NSVT episodes, longest 7 seconds Rare VP No changes made today  EKG is ordered. Personal review of EKG from today shows:         Recent  Labs: 11/21/2022: BUN 15; Creatinine, Ser 0.95; Hemoglobin 12.4; Magnesium 1.7; Platelets 172; Potassium 4.3; Sodium 136; TSH 1.400  No results found for requested labs within last 365 days.   CrCl cannot be calculated (Patient's most recent lab result is older than the maximum 21 days allowed.).   Wt Readings from Last 3 Encounters:  11/21/22 (!) 308 lb 2 oz (139.8 kg)  09/24/22 (!) 303 lb 4.8 oz (137.6 kg)  07/28/22 (!) 307 lb 6 oz (139.4 kg)     Additional studies reviewed include: Previous EP, cardiology notes.   TTE, 12/12/2022  1. Left ventricular ejection fraction, by estimation, is 55 to 60%. The left ventricle has normal function. The left ventricle has no regional wall motion abnormalities. There is mild left ventricular hypertrophy. Left ventricular diastolic parameters are consistent with Grade I diastolic dysfunction (impaired relaxation).   2. Right ventricular systolic function is normal. The right ventricular size is normal.   3. The mitral valve is normal in structure. No evidence of mitral valve regurgitation.  4. The aortic valve has been repaired/replaced. Aortic valve regurgitation is not visualized. There is a 29 mm Sapien prosthetic (TAVR) valve present in the aortic position. Procedure Date: 05/14/21. Aortic valve mean gradient measures 7.3 mmHg.   5. The inferior vena cava is normal in size with greater than 50% respiratory variability, suggesting right atrial pressure of 3 mmHg.   TTE, 05/19/2022  1. Left ventricular ejection fraction, by estimation, is 55 to 60%. The left ventricle has normal function. The left ventricle has no regional wall motion abnormalities. There is mild left ventricular hypertrophy. Left ventricular diastolic parameters are consistent with Grade I diastolic dysfunction (impaired relaxation).   2. Right ventricular systolic function is normal. The right ventricular size is normal. Tricuspid regurgitation signal is inadequate for assessing PA  pressure.   3. The mitral valve is normal in structure. Trivial mitral valve regurgitation. No evidence of mitral stenosis.   4. The aortic valve has been repaired/replaced. Aortic valve regurgitation is trivial. There is a 29 mm Edwards Sapien prosthetic (TAVR) valve present in the aortic position. Echo findings are consistent with normal structure and function of the aortic  valve prosthesis. Aortic valve mean gradient measures 9.0 mmHg.   5. Aortic dilatation noted. There is mild dilatation of the ascending aorta, measuring 39 mm.   TTE, 06/26/2021 1. Left ventricular ejection fraction, by estimation, is 55 to 60%. The left ventricle has normal function. The left ventricle has no regional wall motion abnormalities. There is mild concentric left ventricular hypertrophy. Left ventricular diastolic parameters are consistent with Grade I diastolic dysfunction (impaired relaxation).   2. Right ventricular systolic function is normal. The right ventricular size is normal. Tricuspid regurgitation signal is inadequate for assessing PA pressure.   3. Left atrial size was moderately dilated.   4. The mitral valve is normal in structure. Trivial mitral valve regurgitation. No evidence of mitral stenosis.   5. The aortic valve has been repaired/replaced. Aortic valve regurgitation is not visualized. No aortic stenosis is present. There is a 29 mm Sapien prosthetic (TAVR) valve present in the aortic position. Procedure Date: 05/14/21. Aortic valve mean gradient measures 10.2 mmHg. Aortic valve Vmax measures 2.20 m/s. Aortic valve acceleration time measures 85 msec.   6. Aortic dilatation noted. There is mild dilatation of the ascending aorta, measuring 44 mm. There is borderline dilatation of the aortic root, measuring 40 mm.   7. The inferior vena cava is normal in size with greater than 50% respiratory variability, suggesting right atrial pressure of 3 mmHg.   Comparison(s): No significant change from prior  study. Prior images reviewed side by side. 05/15/21 EF 60-65%. AV peak PG, mean PG.   R/LHC 02/15/2021 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  Otherwise, there is mild-moderate, non-obstructive coronary artery disease, as detailed below. Mildly elevated left heart and pulmonary artery pressures (PCWP 22 mmHg, mean PAP 26 mmHg). Severely elevated right heart filling pressure (mean RAP 16 mmHg, RVEDP 16 mmHg). Normal Fick cardiac output/index (CO 6.1 L/min, CI 2.5 L/min/m^2).  ASSESSMENT AND PLAN:  #) NSVT #) SVT Frequent arrhythmia episodes by device No symptoms during episodes No decreased exercise tolerance Update echo Update CBC, BMP, mag, TSH Start 25mg  toprol nightly  #) intermittent CHB #) MDT dual chamber PPM in situ Device functioning well, see paceart for details Rare VP       Current medicines are reviewed at length with the patient today.   The  patient does not have concerns regarding his medicines.  The following changes were made today:   START 25mg  toprol daily  Labs/ tests ordered today include:  No orders of the defined types were placed in this encounter.    Disposition: Follow up with EP APP in 3 months   Signed, Sherie Don, NP  01/20/23  6:53 PM  Electrophysiology [facility]

## 2023-01-21 ENCOUNTER — Ambulatory Visit: Payer: Medicare HMO | Attending: Cardiology | Admitting: Cardiology

## 2023-01-21 DIAGNOSIS — I471 Supraventricular tachycardia, unspecified: Secondary | ICD-10-CM

## 2023-01-21 DIAGNOSIS — I442 Atrioventricular block, complete: Secondary | ICD-10-CM

## 2023-01-21 DIAGNOSIS — Z950 Presence of cardiac pacemaker: Secondary | ICD-10-CM

## 2023-01-21 DIAGNOSIS — I472 Ventricular tachycardia, unspecified: Secondary | ICD-10-CM

## 2023-01-22 ENCOUNTER — Encounter: Payer: Self-pay | Admitting: Cardiology

## 2023-01-27 ENCOUNTER — Ambulatory Visit: Payer: Medicare HMO | Admitting: Family Medicine

## 2023-02-02 ENCOUNTER — Other Ambulatory Visit: Payer: Self-pay | Admitting: Family Medicine

## 2023-02-02 DIAGNOSIS — E782 Mixed hyperlipidemia: Secondary | ICD-10-CM

## 2023-02-02 DIAGNOSIS — I700 Atherosclerosis of aorta: Secondary | ICD-10-CM

## 2023-02-02 DIAGNOSIS — N401 Enlarged prostate with lower urinary tract symptoms: Secondary | ICD-10-CM

## 2023-02-02 DIAGNOSIS — E1159 Type 2 diabetes mellitus with other circulatory complications: Secondary | ICD-10-CM

## 2023-02-02 DIAGNOSIS — R809 Proteinuria, unspecified: Secondary | ICD-10-CM

## 2023-02-09 NOTE — Progress Notes (Unsigned)
Name: Jordan [name]   MRN: [id number]    DOB: 07-17-1940   Date:02/10/2023       Progress Note  Subjective  Chief Complaint  Follow Up  HPI  Discussed the use of AI scribe software for clinical note transcription with the patient, who gave verbal consent to proceed.  History of Present Illness   The patient, with a history of diabetes, obesity, dyslipidemia, hypertension, and erectile dysfunction, reports no new health issues since the last visit. He believes he is managing his diet and physical activity well. However, his recent HbA1c result of 7.6 indicates a slight increase from the previous 7.2, suggesting suboptimal diabetes control. The patient is currently on glipizide 5mg , metformin 750mg  once daily, and pioglitazone 15mg  for diabetes management. He reports checking his blood sugar at home, with a recent reading of 135.  The patient also has a history of diabetes with microalbuminuria and has been offered a new class of medication, SGL-2 agonists, to protect his kidneys and lower his blood pressure. However, due to cost concerns, he has chosen to continue with his current regimen.  In terms of urinary symptoms, the patient reports frequent urination and a feeling of incomplete bladder emptying, which he attributes to an enlarged prostate. He is currently on tamsulosin for this issue and has seen a urologist in the past. He also reports some improvement in erectile dysfunction with Cialis.  The patient has a history of chronic bronchitis and continues to cough up phlegm in the mornings, but reports no wheezing or shortness of breath. 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 He continues to follow up with a cardiologist for these issues.  The patient has a history of gout, managed with allopurinol, and iron deficiency anemia, managed with over-the-counter iron supplements. He denies any recent rectal bleeding.  He also reports leg pain after walking for more than 30 minutes, which was attributed to lumbar spinal stenosis rather than vascular disease. He is managing this with physical therapy.  The patient has a history of arthritis in the knee, which he manages with a cane. He also reports a significant weight gain since the last visit, despite efforts to reduce food intake and maintain physical activity. He has been advised to lose weight and improve his diet.          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   PAD (peripheral artery disease) (HCC) 01/17/2020   Diverticulosis of colon with hemorrhage 05/26/2019   Atherosclerosis of aorta (HCC) 04/26/2019   Rectal bleed 04/22/2019   Chronic bronchitis (HCC) 07/30/2018   Bilateral hydrocele 11/27/2015   Ventral hernia without obstruction or gangrene 11/27/2015   Controlled type 2 diabetes mellitus with microalbuminuria (HCC) 10/26/2015   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   Hyperlipidemia 11/07/2014   Essential hypertension 11/07/2014   Obesity, Class III, BMI 40-49.9 (morbid obesity) (  HCC) 11/07/2014   Controlled gout 11/07/2014   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   Constipation 10/26/2012   Incisional hernia, without obstruction or gangrene 10/26/2012   Left inguinal hernia     Past  Surgical History:  Procedure Laterality Date   cataract surgery     COLONOSCOPY  03/17/2010   COLONOSCOPY N/A 04/22/2019   Procedure: COLONOSCOPY;  Surgeon: Mansouraty, Netty Starring., MD;  Location: [facility];  Service: Gastroenterology;  Laterality: N/A;   EYE SURGERY  03/17/2009   cataract   HERNIA REPAIR     INTRAOPERATIVE TRANSTHORACIC ECHOCARDIOGRAM N/A 05/14/2021   Procedure: INTRAOPERATIVE TRANSTHORACIC ECHOCARDIOGRAM;  Surgeon: Tonny Bollman, MD;  Location: [facility] INVASIVE CV LAB;  Service: Open Heart Surgery;  Laterality: N/A;   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   Procedure: PACEMAKER IMPLANT;  Surgeon: Marinus Maw, MD;  Location: [facility] INVASIVE CV LAB;  Service: Cardiovascular;  Laterality: N/A;   RIGHT HEART CATH AND CORONARY ANGIOGRAPHY Bilateral 02/15/2021   Procedure: RIGHT HEART CATH AND CORONARY ANGIOGRAPHY;  Surgeon: Yvonne Kendall, MD;  Location: [facility] INVASIVE CV LAB;  Service: Cardiovascular;  Laterality: Bilateral;   TRANSCATHETER AORTIC VALVE REPLACEMENT, TRANSFEMORAL N/A 05/14/2021   Procedure: Transcatheter Aortic Valve Replacement, Transfemoral;  Surgeon: Tonny Bollman, MD;  Location: [facility] CV LAB;  Service: Open Heart Surgery;  Laterality: N/A;   UMBILICAL HERNIA REPAIR  03/17/2009    Family History  Problem Relation Age of Onset   Heart disease Father    Hypertension Father    Diabetes Sister    Diabetes Brother    Prostate cancer Brother    Diabetes Sister    Kidney disease Neg Hx    Kidney cancer Neg Hx    Bladder Cancer Neg Hx    Colon cancer Neg Hx    Esophageal cancer Neg Hx    Inflammatory bowel disease Neg Hx    Liver disease Neg Hx    Pancreatic cancer Neg Hx    Rectal cancer Neg Hx    Stomach cancer Neg Hx     Social History   Tobacco Use   Smoking status: Former    Current packs/day: 0.00    Average packs/day: 1 pack/day for 10.7 years (10.7 ttl pk-yrs)    Types: Cigarettes    Start date: 03/17/1965    Quit date: 11/27/1975     Years since quitting: 47.2   Smokeless tobacco: Never   Tobacco comments:    quit 40 years  Substance Use Topics   Alcohol use: Yes    Alcohol/week: 1.0 standard drink of alcohol    Types: 1 Standard drinks or equivalent per week    Comment: socially - 1 x year     Current Outpatient Medications:    acetaminophen (TYLENOL) 325 MG tablet, Take 2 tablets (650 mg total) by mouth every 4 (four) hours as needed for headache or mild pain., Disp: , Rfl:    acetaminophen-codeine (TYLENOL #3) 300-30 MG tablet, Take 1 tablet by mouth every 6 (six) hours as needed., Disp: , Rfl:    Alcohol Swabs (B-D SINGLE USE SWABS REGULAR) PADS, 1 each by Does not apply route as needed., Disp: 100 each, Rfl: 3   allopurinol (ZYLOPRIM) 300 MG tablet, TAKE 1 TABLET EVERY DAY, Disp: 90 tablet, Rfl: 2   amLODipine (NORVASC) 5 MG tablet, Take 1 tablet (5 mg total) by mouth daily., Disp: 90 tablet, Rfl: 0   aspirin EC 81 MG tablet, Take 1 tablet (81  mg total) by mouth daily. Swallow whole., Disp: , Rfl:    atorvastatin (LIPITOR) 80 MG tablet, Take 1 tablet (80 mg total) by mouth at bedtime., Disp: 90 tablet, Rfl: 0   Cholecalciferol (VITAMIN D3 PO), Take 1 tablet by mouth every Monday, Wednesday, and Friday., Disp: , Rfl:    ferrous sulfate 325 (65 FE) MG tablet, Take 325 mg by mouth daily., Disp: , Rfl:    furosemide (LASIX) 20 MG tablet, TAKE 1 TABLET EVERY DAY, Disp: 90 tablet, Rfl: 3   glipiZIDE (GLUCOTROL XL) 5 MG 24 hr tablet, Take 1 tablet (5 mg total) by mouth daily with breakfast., Disp: 90 tablet, Rfl: 0   latanoprost (XALATAN) 0.005 % ophthalmic solution, Place 1 drop into the left eye at bedtime. , Disp: , Rfl:    metFORMIN (GLUCOPHAGE-XR) 750 MG 24 hr tablet, Take 2 tablets (1,500 mg total) by mouth daily with breakfast. (Patient taking differently: Take 750 mg by mouth daily with breakfast.), Disp: 180 tablet, Rfl: 0   metoprolol succinate (TOPROL XL) 25 MG 24 hr tablet, Take 1 tablet (25 mg total) by mouth  daily., Disp: 90 tablet, Rfl: 3   Omega-3 Fatty Acids (FISH OIL PO), Take 1 capsule by mouth 2 (two) times daily. , Disp: , Rfl:    pioglitazone (ACTOS) 15 MG tablet, Take 1 tablet (15 mg total) by mouth daily., Disp: 90 tablet, Rfl: 0   polyethylene glycol (MIRALAX / GLYCOLAX) packet, Take 17 g by mouth at bedtime. , Disp: , Rfl:    tadalafil (CIALIS) 20 MG tablet, Take 1 tablet (20 mg total) by mouth every three (3) days as needed for erectile dysfunction., Disp: 30 tablet, Rfl: 0   tamsulosin (FLOMAX) 0.4 MG CAPS capsule, Take 1 capsule (0.4 mg total) by mouth daily., Disp: 90 capsule, Rfl: 0   TRUE METRIX BLOOD GLUCOSE TEST test strip, CHECK BLOOD SUGAR AS DIRECTED, Disp: 200 strip, Rfl: 3   TRUEplus Lancets 33G MISC, CHECK BLOOD GLUCOSE TWO TIMES DAILY, Disp: 200 each, Rfl: 3   Blood Glucose Calibration (TRUE METRIX LEVEL 1) Low SOLN, 1 Bottle by In Vitro route once for 1 dose., Disp: 1 each, Rfl: 0   Blood Glucose Monitoring Suppl (TRUE METRIX AIR GLUCOSE METER) DEVI, 1 each by Does not apply route once for 1 dose., Disp: 1 each, Rfl: 0  Allergies  Allergen Reactions   Ace Inhibitors Other (See Comments)    angioedema    I personally reviewed active problem list, medication list, allergies, family history, social history, health maintenance with the patient/caregiver today.   ROS  Ten systems reviewed and is negative except as mentioned in HPI   Objective  Vitals:   02/10/23 1441  BP: 130/74  Pulse: 97  Resp: 16  SpO2: 97%  Weight: (!) 311 lb (141.1 kg)  Height: 5\' 9"  (1.753 m)    Body mass index is 45.93 kg/m.  Physical Exam  Constitutional: Patient appears well-developed and well-nourished. Obese No distress.  HEENT: head atraumatic, normocephalic, pupils equal and reactive to light, , neck supple, throat within normal limits Cardiovascular: Normal rate, regular rhythm , opening click heard on 2nd left intercostal space   No murmur heard. No BLE  edema. Pulmonary/Chest: Effort normal and breath sounds normal. No respiratory distress. Abdominal: Soft.  There is no tenderness. Psychiatric: Patient has a normal mood and affect. behavior is normal. Judgment and thought content normal.    PHQ2/9:    02/10/2023    2:41 PM 09/24/2022  1:36 PM 05/20/2022    2:01 PM 02/11/2022    9:44 AM 12/30/2021    1:50 PM  Depression screen PHQ 2/9  Decreased Interest 0 0 0 0 0  Down, Depressed, Hopeless 0 0 0 0 0  PHQ - 2 Score 0 0 0 0 0  Altered sleeping 0 0 0  0  Tired, decreased energy 0 0 0  0  Change in appetite 0 0 0  0  Feeling bad or failure about yourself  0 0 0  0  Trouble concentrating 0 0 0  0  Moving slowly or fidgety/restless 0 0 0  0  Suicidal thoughts 0 0 0  0  PHQ-9 Score 0 0 0  0    phq 9 is negative   Fall Risk:    02/10/2023    2:41 PM 09/24/2022    1:35 PM 05/20/2022    2:01 PM 02/11/2022    9:44 AM 12/30/2021    1:50 PM  Fall Risk   Falls in the past year? 0 0 0 0 0  Number falls in past yr: 0   0 0  Injury with Fall? 0   0 0  Risk for fall due to : Impaired balance/gait Impaired balance/gait Impaired balance/gait No Fall Risks No Fall Risks  Follow up Falls prevention discussed Falls prevention discussed;Education provided;Falls evaluation completed Falls prevention discussed;Education provided;Falls evaluation completed Falls evaluation completed Falls prevention discussed      Functional Status Survey: Is the patient deaf or have difficulty hearing?: No Does the patient have difficulty seeing, even when wearing glasses/contacts?: No Does the patient have difficulty concentrating, remembering, or making decisions?: No Does the patient have difficulty walking or climbing stairs?: Yes Does the patient have difficulty dressing or bathing?: No Does the patient have difficulty doing errands alone such as visiting a doctor's office or shopping?: No    Assessment & Plan  Assessment and Plan    Diabetes  Mellitus with associated HTN, obesity, dyslipidemia and 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 Patient is currently on Glipizide 5mg , Metformin 750mg  once daily, and Pioglitazone 15mg . Patient reports adherence to diet and physical activity. -Increase Metformin to 750mg  twice daily to improve glycemic control. -Check blood glucose at home regularly.  Diabetes with Microalbuminuria Patient has protein in urine. Discussed the benefits of SGLT-2 inhibitors, but patient declined due to cost. -Continue current management.  Hypertension Blood pressure is well-controlled at 130/74 on Amlodipine 5mg . -Continue Amlodipine 5mg  daily.  Erectile Dysfunction Patient reports some improvement with Cialis. -Continue Cialis as needed.  Morbid Obesity BMI is 45.93. Patient reports attempts to reduce food intake and walks for 30-35 minutes daily. -Encourage continued diet and exercise efforts. -Plan for weight check in 4 months.  Chronic Bronchitis Patient reports morning cough with phlegm production, but no wheezing or shortness of breath. -Continue current management.  Aortic Valve Stenosis and Ascending Aorta Dilation Patient has a history of TAVR procedure and pacemaker placement. No current symptoms. -Continue follow-ups with cardiologist.  Gout No current issues. Patient is on Allopurinol. -Continue Allopurinol.  Iron Deficiency Anemia Patient is taking iron supplements three times a week. Last hemoglobin was low. -Check iron levels today. -Continue iron supplementation.  Lumbar Spinal Stenosis Patient reports leg tiredness after walking for extended periods. No current back pain. -Continue current management.  Arthritis Patient uses a cane for assistance due to knee pain. -Continue current management.  General Health Maintenance -Perform foot exam today. -Perform Medicare  wellness exam. -Check protein in urine and cholesterol levels today. -Follow-up in 4  months.

## 2023-02-10 ENCOUNTER — Ambulatory Visit (INDEPENDENT_AMBULATORY_CARE_PROVIDER_SITE_OTHER): Payer: Medicare HMO | Admitting: Family Medicine

## 2023-02-10 ENCOUNTER — Other Ambulatory Visit: Payer: Self-pay | Admitting: Family Medicine

## 2023-02-10 ENCOUNTER — Encounter: Payer: Self-pay | Admitting: Family Medicine

## 2023-02-10 VITALS — BP 130/74 | HR 97 | Resp 16 | Ht 69.0 in | Wt 311.0 lb

## 2023-02-10 DIAGNOSIS — E1129 Type 2 diabetes mellitus with other diabetic kidney complication: Secondary | ICD-10-CM

## 2023-02-10 DIAGNOSIS — E1159 Type 2 diabetes mellitus with other circulatory complications: Secondary | ICD-10-CM | POA: Diagnosis not present

## 2023-02-10 DIAGNOSIS — E782 Mixed hyperlipidemia: Secondary | ICD-10-CM

## 2023-02-10 DIAGNOSIS — I152 Hypertension secondary to endocrine disorders: Secondary | ICD-10-CM | POA: Diagnosis not present

## 2023-02-10 DIAGNOSIS — R809 Proteinuria, unspecified: Secondary | ICD-10-CM

## 2023-02-10 DIAGNOSIS — I77810 Thoracic aortic ectasia: Secondary | ICD-10-CM

## 2023-02-10 DIAGNOSIS — M109 Gout, unspecified: Secondary | ICD-10-CM

## 2023-02-10 DIAGNOSIS — J410 Simple chronic bronchitis: Secondary | ICD-10-CM | POA: Diagnosis not present

## 2023-02-10 DIAGNOSIS — Z862 Personal history of diseases of the blood and blood-forming organs and certain disorders involving the immune mechanism: Secondary | ICD-10-CM

## 2023-02-10 DIAGNOSIS — I700 Atherosclerosis of aorta: Secondary | ICD-10-CM

## 2023-02-10 DIAGNOSIS — N401 Enlarged prostate with lower urinary tract symptoms: Secondary | ICD-10-CM

## 2023-02-10 DIAGNOSIS — N529 Male erectile dysfunction, unspecified: Secondary | ICD-10-CM

## 2023-02-10 DIAGNOSIS — I739 Peripheral vascular disease, unspecified: Secondary | ICD-10-CM | POA: Diagnosis not present

## 2023-02-10 DIAGNOSIS — R351 Nocturia: Secondary | ICD-10-CM

## 2023-02-10 DIAGNOSIS — Z7984 Long term (current) use of oral hypoglycemic drugs: Secondary | ICD-10-CM

## 2023-02-10 LAB — POCT GLYCOSYLATED HEMOGLOBIN (HGB A1C): Hemoglobin A1C: 7.6 % — AB (ref 4.0–5.6)

## 2023-02-10 MED ORDER — ATORVASTATIN CALCIUM 80 MG PO TABS: 80.0000 mg | ORAL_TABLET | Freq: Every day | ORAL | 3 refills | Status: DC

## 2023-02-10 MED ORDER — TADALAFIL 20 MG PO TABS: 20.0000 mg | ORAL_TABLET | ORAL | 0 refills | Status: DC | PRN

## 2023-02-10 MED ORDER — PIOGLITAZONE HCL 15 MG PO TABS: 15.0000 mg | ORAL_TABLET | Freq: Every day | ORAL | 1 refills | Status: DC

## 2023-02-10 MED ORDER — ALLOPURINOL 300 MG PO TABS: 300.0000 mg | ORAL_TABLET | Freq: Every day | ORAL | 3 refills | Status: DC

## 2023-02-10 MED ORDER — METFORMIN HCL ER 750 MG PO TB24: 1500.0000 mg | ORAL_TABLET | Freq: Every day | ORAL | 1 refills | Status: DC

## 2023-02-10 MED ORDER — GLIPIZIDE ER 5 MG PO TB24: 5.0000 mg | ORAL_TABLET | Freq: Every day | ORAL | 1 refills | Status: DC

## 2023-02-10 MED ORDER — AMLODIPINE BESYLATE 5 MG PO TABS: 5.0000 mg | ORAL_TABLET | Freq: Every day | ORAL | 1 refills | Status: DC

## 2023-02-10 NOTE — Progress Notes (Deleted)
Name: Jordan [name]   MRN: [id number]    DOB: [birth year]   Date:02/10/2023       Progress Note  Subjective  Chief Complaint  Follow Up  HPI  DM with microalbuminuria , obesity, dyslipidemia and HTN and  ED: he is back on Glipizide 5mg  ER , Metformin 1500 mg daily and pioglitazone 15 mg , A1C is going up again, from 7.5 % to 8.1%. it went up to  8.2 % but today is down to 7.2 %  He cannot take ACE because of history of angioedema, bp is controlled with Norvasc .  He denies polyphagia, polydipsia but has polyuria - also has BPH and has nocturia, denies recent episodes of hypoglycemia.    Chronic bronchitis: he used to smoke but quit many years ago, he still has a cough that is productive at times, usually in the  mornings and it is a  pale yellow sputum, he denies wheezing  He has some sob with moderate activity    BPH: seen by [facility] and symptoms are stable, on Flomax but still has nocturia about 2-3 times per night . Stable    HTN:  he is compliant with mediations,  no chest pain,, he has some SOB with activity .He has lower extremity edema but mild , last visit he was having some dizziness and we decreased norvasc from 10 mg to 7.5 mg today bp was still low and he is now on 5 mg dose and bp is good for him at 132/74    Morbid obese : BMI above 40, using a hiking stick and walks 30 minutes about 5 days a week,he eats healthier this time of the year due to the heat, weight is down 4 lbs.    History of Aortic valve stenosis/Asending aorta dilation: under the care of cardiologist - Dr. Mariah Milling,  no syncope or chest pain, had echo normal EF. 55-60  %. He is now also seeing Dr. 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 He had echo done 05/2022  and ascending aorta dilation is stable.    Atherosclerosis of aorta/Hyperlipidemia : he is on  statin therapy, taking aspirin 81 mg daily  last LDL was up from 58 to 81 , explained importance of compliance to get LDL below 70, we will recheck labs next visit    History of iron deficiency anemia:  secondary to rectal bleeding, he has a personal history of hemorrhoids and diverticulosis, he was released from Dr. Servando Snare .He is still taking iron supplementation and denies pica or sob , last hct was at goal, we will recheck it next visit    Gout: taking allopurinol and not on diuretics  Last uric acid was normal . No recent flares .  Claudication lower extremity :  he states no longer walking one hour because he has to take multiple breaks. He has been walking again for 30 minutes no longer having to take a break every time he walks  He was seen by vascular surgeon and felt likely secondary from lumbar spine problems. Discussed risk and benefits of therapy for lumbar spine stenosis and since symptoms only when walking he will continue physical activity and stop to take breaks as needed .   Gait problems: he has OA on both knees he states symptoms have been stable, he uses a cane or walking  stick   Thrombocytopenia: last level was normal again but we will continue to monitor yearly since it goes up and down , recheck it yearly   ED: he states not using cialis due to cost, we will give him a Goodrx voucher to take to [facility]  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   History of angina 02/12/2021   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   PAD (peripheral artery disease) (HCC) 01/17/2020   Diverticulosis of colon with hemorrhage 05/26/2019   Atherosclerosis of aorta (HCC) 04/26/2019   Rectal bleed 04/22/2019   Chronic bronchitis (HCC) 07/30/2018   Bilateral hydrocele 11/27/2015   Ventral hernia without obstruction or gangrene 11/27/2015   Controlled type 2 diabetes mellitus with microalbuminuria (HCC) 10/26/2015   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   Hyperlipidemia 11/07/2014   Essential hypertension 11/07/2014   Obesity, Class III, BMI 40-49.9 (morbid obesity) (HCC) 11/07/2014   Controlled gout 11/07/2014   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   Constipation 10/26/2012   Incisional hernia, without obstruction or gangrene 10/26/2012   Left inguinal hernia     Past Surgical History:  Procedure Laterality Date   cataract surgery     COLONOSCOPY  03/17/2010   COLONOSCOPY N/A 04/22/2019   Procedure: COLONOSCOPY;  Surgeon: Lemar Lofty., MD;  Location: [facility];  Service: Gastroenterology;  Laterality: N/A;   EYE SURGERY  03/17/2009   cataract   HERNIA REPAIR     INTRAOPERATIVE TRANSTHORACIC ECHOCARDIOGRAM N/A 05/14/2021   Procedure: INTRAOPERATIVE TRANSTHORACIC ECHOCARDIOGRAM;  Surgeon: Tonny Bollman, MD;  Location[facility] INVASIVE CV LAB;  Service: Open Heart Surgery;  Laterality: N/A;   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   Procedure: PACEMAKER IMPLANT;  Surgeon: Marinus Maw, MD;  Location: [facility] INVASIVE CV LAB;  Service: Cardiovascular;  Laterality: N/A;   RIGHT HEART CATH AND CORONARY ANGIOGRAPHY Bilateral 02/15/2021   Procedure: RIGHT HEART CATH AND CORONARY ANGIOGRAPHY;  Surgeon: Yvonne Kendall, MD;  Location: [facility] INVASIVE CV LAB;  Service: Cardiovascular;  Laterality: Bilateral;   TRANSCATHETER AORTIC VALVE REPLACEMENT, TRANSFEMORAL N/A 05/14/2021   Procedure: Transcatheter Aortic  Valve Replacement, Transfemoral;  Surgeon: Tonny Bollman, MD;  Location: [facility] INVASIVE CV LAB;  Service: Open Heart Surgery;  Laterality: N/A;   UMBILICAL HERNIA REPAIR  03/17/2009    Family History  Problem Relation Age of Onset   Heart disease Father    Hypertension Father    Diabetes Sister    Diabetes Brother    Prostate cancer Brother    Diabetes Sister    Kidney disease Neg Hx    Kidney cancer Neg Hx    Bladder Cancer Neg Hx    Colon cancer Neg Hx    Esophageal cancer Neg Hx    Inflammatory bowel disease Neg Hx    Liver disease Neg Hx    Pancreatic cancer Neg Hx    Rectal cancer Neg Hx    Stomach cancer Neg Hx     Social History   Tobacco  Use   Smoking status: Former    Current packs/day: 0.00    Average packs/day: 1 pack/day for 10.7 years (10.7 ttl pk-yrs)    Types: Cigarettes    Start date: 03/17/1965    Quit date: 11/27/1975    Years since quitting: 47.2   Smokeless tobacco: Never   Tobacco comments:    quit 40 years  Substance Use Topics   Alcohol use: Yes    Alcohol/week: 1.0 standard drink of alcohol    Types: 1 Standard drinks or equivalent per week    Comment: socially - 1 x year     Current Outpatient Medications:    acetaminophen (TYLENOL) 325 MG tablet, Take 2 tablets (650 mg total) by mouth every 4 (four) hours as needed for headache or mild pain., Disp: , Rfl:    acetaminophen-codeine (TYLENOL #3) 300-30 MG tablet, Take 1 tablet by mouth every 6 (six) hours as needed., Disp: , Rfl:    Alcohol Swabs (B-D SINGLE USE SWABS REGULAR) PADS, 1 each by Does not apply route as needed., Disp: 100 each, Rfl: 3   allopurinol (ZYLOPRIM) 300 MG tablet, TAKE 1 TABLET EVERY DAY, Disp: 90 tablet, Rfl: 2   amLODipine (NORVASC) 5 MG tablet, Take 1 tablet (5 mg total) by mouth daily., Disp: 90 tablet, Rfl: 0   aspirin EC 81 MG tablet, Take 1 tablet (81 mg total) by mouth daily. Swallow whole., Disp: , Rfl:    atorvastatin (LIPITOR) 80 MG tablet, Take 1 tablet (80 mg  total) by mouth at bedtime., Disp: 90 tablet, Rfl: 0   Cholecalciferol (VITAMIN D3 PO), Take 1 tablet by mouth every Monday, Wednesday, and Friday., Disp: , Rfl:    ferrous sulfate 325 (65 FE) MG tablet, Take 325 mg by mouth daily., Disp: , Rfl:    furosemide (LASIX) 20 MG tablet, TAKE 1 TABLET EVERY DAY, Disp: 90 tablet, Rfl: 3   glipiZIDE (GLUCOTROL XL) 5 MG 24 hr tablet, Take 1 tablet (5 mg total) by mouth daily with breakfast., Disp: 90 tablet, Rfl: 0   latanoprost (XALATAN) 0.005 % ophthalmic solution, Place 1 drop into the left eye at bedtime. , Disp: , Rfl:    metFORMIN (GLUCOPHAGE-XR) 750 MG 24 hr tablet, Take 2 tablets (1,500 mg total) by mouth daily with breakfast. (Patient taking differently: Take 750 mg by mouth daily with breakfast.), Disp: 180 tablet, Rfl: 0   metoprolol succinate (TOPROL XL) 25 MG 24 hr tablet, Take 1 tablet (25 mg total) by mouth daily., Disp: 90 tablet, Rfl: 3   Omega-3 Fatty Acids (FISH OIL PO), Take 1 capsule by mouth 2 (two) times daily. , Disp: , Rfl:    pioglitazone (ACTOS) 15 MG tablet, Take 1 tablet (15 mg total) by mouth daily., Disp: 90 tablet, Rfl: 0   polyethylene glycol (MIRALAX / GLYCOLAX) packet, Take 17 g by mouth at bedtime. , Disp: , Rfl:    tadalafil (CIALIS) 20 MG tablet, Take 1 tablet (20 mg total) by mouth every three (3) days as needed for erectile dysfunction., Disp: 30 tablet, Rfl: 0   tamsulosin (FLOMAX) 0.4 MG CAPS capsule, Take 1 capsule (0.4 mg total) by mouth daily., Disp: 90 capsule, Rfl: 0   TRUE METRIX BLOOD GLUCOSE TEST test strip, CHECK BLOOD SUGAR AS DIRECTED, Disp: 200 strip, Rfl: 3   TRUEplus Lancets 33G MISC, CHECK BLOOD GLUCOSE TWO TIMES DAILY, Disp: 200 each, Rfl: 3   Blood Glucose Calibration (TRUE METRIX LEVEL 1) Low SOLN, 1 Bottle by In  Vitro route once for 1 dose., Disp: 1 each, Rfl: 0   Blood Glucose Monitoring Suppl (TRUE METRIX AIR GLUCOSE METER) DEVI, 1 each by Does not apply route once for 1 dose., Disp: 1 each, Rfl:  0  Allergies  Allergen Reactions   Ace Inhibitors Other (See Comments)    angioedema    I personally reviewed active problem list, medication list, allergies, family history, social history, health maintenance with the patient/caregiver today.   ROS  ***  Objective  Vitals:   02/10/23 1441  BP: 130/74  Pulse: 97  Resp: 16  SpO2: 97%  Weight: (!) 311 lb (141.1 kg)  Height: 5\' 9"  (1.753 m)    Body mass index is 45.93 kg/m.  Physical Exam ***   PHQ2/9:    02/10/2023    2:41 PM 09/24/2022    1:36 PM 05/20/2022    2:01 PM 02/11/2022    9:44 AM 12/30/2021    1:50 PM  Depression screen PHQ 2/9  Decreased Interest 0 0 0 0 0  Down, Depressed, Hopeless 0 0 0 0 0  PHQ - 2 Score 0 0 0 0 0  Altered sleeping 0 0 0  0  Tired, decreased energy 0 0 0  0  Change in appetite 0 0 0  0  Feeling bad or failure about yourself  0 0 0  0  Trouble concentrating 0 0 0  0  Moving slowly or fidgety/restless 0 0 0  0  Suicidal thoughts 0 0 0  0  PHQ-9 Score 0 0 0  0    phq 9 is {gen pos [id number]}   Fall Risk:    02/10/2023    2:41 PM 09/24/2022    1:35 PM 05/20/2022    2:01 PM 02/11/2022    9:44 AM 12/30/2021    1:50 PM  Fall Risk   Falls in the past year? 0 0 0 0 0  Number falls in past yr: 0   0 0  Injury with Fall? 0   0 0  Risk for fall due to : Impaired balance/gait Impaired balance/gait Impaired balance/gait No Fall Risks No Fall Risks  Follow up Falls prevention discussed Falls prevention discussed;Education provided;Falls evaluation completed Falls prevention discussed;Education provided;Falls evaluation completed Falls evaluation completed Falls prevention discussed      Functional Status Survey: Is the patient deaf or have difficulty hearing?: No Does the patient have difficulty seeing, even when wearing glasses/contacts?: No Does the patient have difficulty concentrating, remembering, or making decisions?: No Does the patient have difficulty walking or  climbing stairs?: Yes Does the patient have difficulty dressing or bathing?: No Does the patient have difficulty doing errands alone such as visiting a doctor's office or shopping?: No    Assessment & Plan  *** There are no diagnoses linked to this encounter.

## 2023-02-11 LAB — COMPLETE METABOLIC PANEL WITH GFR
AG Ratio: 1.1 (calc) (ref 1.0–2.5)
ALT: 11 U/L (ref 9–46)
AST: 19 U/L (ref 10–35)
Albumin: 4 g/dL (ref 3.6–5.1)
Alkaline phosphatase (APISO): 64 U/L (ref 35–144)
BUN: 17 mg/dL (ref 7–25)
CO2: 25 mmol/L (ref 20–32)
Calcium: 9.8 mg/dL (ref 8.6–10.3)
Chloride: 104 mmol/L (ref 98–110)
Creat: 0.89 mg/dL (ref 0.70–1.22)
Globulin: 3.7 g/dL (ref 1.9–3.7)
Glucose, Bld: 128 mg/dL — ABNORMAL HIGH (ref 65–99)
Potassium: 4.6 mmol/L (ref 3.5–5.3)
Sodium: 138 mmol/L (ref 135–146)
Total Bilirubin: 0.8 mg/dL (ref 0.2–1.2)
Total Protein: 7.7 g/dL (ref 6.1–8.1)
eGFR: 86 mL/min/{1.73_m2} (ref >=60)

## 2023-02-11 LAB — CBC WITH DIFFERENTIAL/PLATELET
Absolute Lymphocytes: 1044 {cells}/uL (ref 850–3900)
Absolute Monocytes: 475 {cells}/uL (ref 200–950)
Basophils Absolute: 20 {cells}/uL (ref 0–200)
Basophils Relative: 0.4 %
Eosinophils Absolute: 49 {cells}/uL (ref 15–500)
Eosinophils Relative: 1 %
HCT: 39.4 % (ref 38.5–50.0)
Hemoglobin: 12.9 g/dL — ABNORMAL LOW (ref 13.2–17.1)
MCH: 28.4 pg (ref 27.0–33.0)
MCHC: 32.7 g/dL (ref 32.0–36.0)
MCV: 86.6 fL (ref 80.0–100.0)
MPV: 12.6 fL — ABNORMAL HIGH (ref 7.5–12.5)
Monocytes Relative: 9.7 %
Neutro Abs: 3312 {cells}/uL (ref 1500–7800)
Neutrophils Relative %: 67.6 %
Platelets: 161 10*3/uL (ref 140–400)
RBC: 4.55 10*6/uL (ref 4.20–5.80)
RDW: 16.4 % — ABNORMAL HIGH (ref 11.0–15.0)
Total Lymphocyte: 21.3 %
WBC: 4.9 10*3/uL (ref 3.8–10.8)

## 2023-02-11 LAB — LIPID PANEL
Cholesterol: 140 mg/dL (ref <200)
HDL: 49 mg/dL (ref >=40)
LDL Cholesterol (Calc): 77 mg/dL
Non-HDL Cholesterol (Calc): 91 mg/dL (ref <130)
Total CHOL/HDL Ratio: 2.9 (calc) (ref <5.0)
Triglycerides: 61 mg/dL (ref <150)

## 2023-02-11 LAB — IRON,TIBC AND FERRITIN PANEL
%SAT: 24 % (ref 20–48)
Ferritin: 37 ng/mL (ref 24–380)
Iron: 83 ug/dL (ref 50–180)
TIBC: 350 ug/dL (ref 250–425)

## 2023-02-11 LAB — MICROALBUMIN / CREATININE URINE RATIO
Creatinine, Urine: 29 mg/dL (ref 20–320)
Microalb Creat Ratio: 14 mg/g{creat} (ref <30)
Microalb, Ur: 0.4 mg/dL

## 2023-02-18 ENCOUNTER — Other Ambulatory Visit: Payer: Self-pay | Admitting: Family Medicine

## 2023-02-18 ENCOUNTER — Ambulatory Visit (INDEPENDENT_AMBULATORY_CARE_PROVIDER_SITE_OTHER): Payer: Medicare HMO

## 2023-02-18 DIAGNOSIS — I472 Ventricular tachycardia, unspecified: Secondary | ICD-10-CM

## 2023-02-18 LAB — CUP PACEART REMOTE DEVICE CHECK
Battery Remaining Longevity: 162 mo
Battery Voltage: 3.05 V
Brady Statistic AP VP Percent: 0.01 %
Brady Statistic AP VS Percent: 23.79 %
Brady Statistic AS VP Percent: 0.03 %
Brady Statistic AS VS Percent: 76.17 %
Brady Statistic RA Percent Paced: 23.89 %
Brady Statistic RV Percent Paced: 0.05 %
Date Time Interrogation Session: 20241204053902
Implantable Lead Connection Status: 753985
Implantable Lead Connection Status: 753985
Implantable Lead Implant Date: 20230307
Implantable Lead Implant Date: 20230307
Implantable Lead Location: 753859
Implantable Lead Location: 753860
Implantable Lead Model: 3830
Implantable Lead Model: 5076
Implantable Pulse Generator Implant Date: 20230307
Lead Channel Impedance Value: 323 Ohm
Lead Channel Impedance Value: 380 Ohm
Lead Channel Impedance Value: 399 Ohm
Lead Channel Impedance Value: 570 Ohm
Lead Channel Pacing Threshold Amplitude: 0.625 V
Lead Channel Pacing Threshold Amplitude: 1 V
Lead Channel Pacing Threshold Pulse Width: 0.4 ms
Lead Channel Pacing Threshold Pulse Width: 0.4 ms
Lead Channel Sensing Intrinsic Amplitude: 16.875 mV
Lead Channel Sensing Intrinsic Amplitude: 16.875 mV
Lead Channel Sensing Intrinsic Amplitude: 4.5 mV
Lead Channel Sensing Intrinsic Amplitude: 4.5 mV
Lead Channel Setting Pacing Amplitude: 1.5 V
Lead Channel Setting Pacing Amplitude: 2 V
Lead Channel Setting Pacing Pulse Width: 0.4 ms
Lead Channel Setting Sensing Sensitivity: 1.2 mV
Zone Setting Status: 755011
Zone Setting Status: 755011

## 2023-03-19 ENCOUNTER — Ambulatory Visit (INDEPENDENT_AMBULATORY_CARE_PROVIDER_SITE_OTHER): Payer: Medicare HMO | Admitting: Podiatry

## 2023-03-19 DIAGNOSIS — Z91199 Patient's noncompliance with other medical treatment and regimen due to unspecified reason: Secondary | ICD-10-CM

## 2023-03-19 NOTE — Progress Notes (Signed)
 1. No-show for appointment

## 2023-03-26 ENCOUNTER — Ambulatory Visit (INDEPENDENT_AMBULATORY_CARE_PROVIDER_SITE_OTHER): Payer: Medicare HMO

## 2023-03-26 DIAGNOSIS — Z0000 Encounter for general adult medical examination without abnormal findings: Secondary | ICD-10-CM

## 2023-03-26 NOTE — Progress Notes (Signed)
 Subjective:   Jordan [name] is a 83 y.o. male who presents for Medicare Annual/Subsequent preventive examination.  Visit Complete: Virtual I connected with  Jordan [name] on 03/26/23 by a audio enabled telemedicine application and verified that I am speaking with the correct person using two identifiers.  This patient declined Interactive audio and acupuncturist. Therefore the visit was completed with audio only.   Patient Location: Home  Provider Location: Office/Clinic  I discussed the limitations of evaluation and management by telemedicine. The patient expressed understanding and agreed to proceed.  Vital Signs: Because this visit was a virtual/telehealth visit, some criteria may be missing or patient reported. Any vitals not documented were not able to be obtained and vitals that have been documented are patient reported.  Cardiac Risk Factors include: advanced age (>22men, >76 women);diabetes mellitus;hypertension;dyslipidemia;male gender;microalbuminuria;obesity (BMI >30kg/m2)     Objective:    There were no vitals filed for this visit. There is no height or weight on file to calculate BMI.     03/26/2023    2:38 PM 02/11/2022   10:49 AM 05/18/2021   10:00 PM 05/14/2021   10:33 AM 05/10/2021   11:40 AM 02/15/2021   12:11 PM 02/05/2021   10:09 AM  Advanced Directives  Does Patient Have a Medical Advance Directive? No No No No No No No  Would patient like information on creating a medical advance directive? No - Patient declined No - Patient declined No - Patient declined No - Patient declined No - Patient declined No - Patient declined No - Patient declined    Current Medications (verified) Outpatient Encounter Medications as of 03/26/2023  Medication Sig   acetaminophen  (TYLENOL ) 325 MG tablet Take 2 tablets (650 mg total) by mouth every 4 (four) hours as needed for headache or mild pain.   acetaminophen -codeine (TYLENOL  #3) 300-30 MG tablet Take 1 tablet  by mouth every 6 (six) hours as needed.   Alcohol Swabs (B-D SINGLE USE SWABS REGULAR) PADS 1 each by Does not apply route as needed.   allopurinol  (ZYLOPRIM ) 300 MG tablet Take 1 tablet (300 mg total) by mouth daily.   amLODipine  (NORVASC ) 5 MG tablet Take 1 tablet (5 mg total) by mouth daily.   aspirin  EC 81 MG tablet Take 1 tablet (81 mg total) by mouth daily. Swallow whole.   atorvastatin  (LIPITOR) 80 MG tablet Take 1 tablet (80 mg total) by mouth at bedtime.   Cholecalciferol (VITAMIN D3 PO) Take 1 tablet by mouth every Monday, Wednesday, and Friday.   ferrous sulfate 325 (65 FE) MG tablet Take 325 mg by mouth daily.   furosemide  (LASIX ) 20 MG tablet TAKE 1 TABLET EVERY DAY   glipiZIDE  (GLUCOTROL  XL) 5 MG 24 hr tablet Take 1 tablet (5 mg total) by mouth daily with breakfast.   latanoprost  (XALATAN ) 0.005 % ophthalmic solution Place 1 drop into the left eye at bedtime.    metFORMIN  (GLUCOPHAGE -XR) 750 MG 24 hr tablet Take 2 tablets (1,500 mg total) by mouth daily with breakfast.   metoprolol  succinate (TOPROL  XL) 25 MG 24 hr tablet Take 1 tablet (25 mg total) by mouth daily.   Omega-3 Fatty Acids (FISH OIL PO) Take 1 capsule by mouth 2 (two) times daily.    pioglitazone  (ACTOS ) 15 MG tablet Take 1 tablet (15 mg total) by mouth daily.   polyethylene glycol (MIRALAX  / GLYCOLAX ) packet Take 17 g by mouth at bedtime.    tadalafil  (CIALIS ) 20 MG tablet Take 1 tablet (  20 mg total) by mouth every three (3) days as needed for erectile dysfunction.   TRUE METRIX BLOOD GLUCOSE TEST test strip CHECK BLOOD SUGAR AS DIRECTED   TRUEplus Lancets 33G MISC CHECK BLOOD GLUCOSE TWO TIMES DAILY   Blood Glucose Calibration (TRUE METRIX LEVEL 1) Low SOLN 1 Bottle by In Vitro route once for 1 dose.   Blood Glucose Monitoring Suppl (TRUE METRIX AIR GLUCOSE METER) DEVI 1 each by Does not apply route once for 1 dose.   No facility-administered encounter medications on file as of 03/26/2023.    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 Wonda and Dr. Lucas   Unspecified constipation    Past Surgical History:  Procedure Laterality Date   cataract surgery     COLONOSCOPY  03/17/2010   COLONOSCOPY N/A 04/22/2019   Procedure: COLONOSCOPY;  Surgeon: Wilhelmenia Aloha Raddle., MD;  Location: [facility];  Service: Gastroenterology;  Laterality: N/A;   EYE SURGERY  03/17/2009   cataract   HERNIA REPAIR     INTRAOPERATIVE TRANSTHORACIC ECHOCARDIOGRAM N/A 05/14/2021   Procedure: INTRAOPERATIVE TRANSTHORACIC ECHOCARDIOGRAM;  Surgeon: Wonda Sharper, MD;  Location: [facility] INVASIVE CV LAB;  Service: Open Heart Surgery;  Laterality: N/A;   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   Procedure: PACEMAKER IMPLANT;  Surgeon: Waddell Danelle ORN, MD;  Location: [facility] INVASIVE CV LAB;  Service: Cardiovascular;  Laterality: N/A;   RIGHT HEART CATH AND CORONARY ANGIOGRAPHY Bilateral 02/15/2021   Procedure: RIGHT HEART CATH AND CORONARY ANGIOGRAPHY;  Surgeon: Mady Bruckner, MD;  Location: [facility] INVASIVE CV LAB;  Service: Cardiovascular;  Laterality: Bilateral;   TRANSCATHETER AORTIC VALVE REPLACEMENT, TRANSFEMORAL N/A 05/14/2021   Procedure: Transcatheter Aortic Valve Replacement, Transfemoral;  Surgeon: Wonda Sharper, MD;  Location: [facility] INVASIVE CV LAB;  Service: Open Heart Surgery;  Laterality: N/A;   UMBILICAL HERNIA REPAIR  03/17/2009   Family History  Problem Relation Age of Onset    Heart disease Father    Hypertension Father    Diabetes Sister    Diabetes Brother    Prostate cancer Brother    Diabetes Sister    Kidney disease Neg Hx    Kidney cancer Neg Hx    Bladder Cancer Neg Hx    Colon cancer Neg Hx    Esophageal cancer Neg Hx    Inflammatory bowel disease Neg Hx    Liver disease Neg Hx    Pancreatic cancer Neg Hx    Rectal cancer Neg Hx    Stomach cancer Neg Hx    Social History   Socioeconomic History   Marital status: Widowed    Spouse name: Not on file   Number of children: 2   Years of education: Not on file   Highest education level: Some college, no degree  Occupational History   Occupation: retired     Comment: used to [employer]  Tobacco Use   Smoking status: Former    Current packs/day: 0.00    Average packs/day: 1 pack/day for 10.7 years (10.7 ttl pk-yrs)    Types: Cigarettes    Start date: 03/17/1965    Quit date: 11/27/1975    Years since quitting: 47.3   Smokeless tobacco: Never   Tobacco comments:    quit 40 years  Vaping Use   Vaping status: Never Used  Substance and Sexual Activity   Alcohol use: Yes    Alcohol/week: 1.0 standard drink of alcohol    Types: 1 Standard drinks or equivalent per week    Comment: socially - 1 x year   Drug use: No   Sexual activity: Yes    Partners: Female  Other Topics Concern   Not on file  Social History Narrative   Lives by himself in [place]   Two grown children, daughter in [place] and son in [place]    He goes to [place] to visit his sisters often    Social Drivers of Corporate Investment Banker Strain: Low Risk  (03/26/2023)   Overall Financial Resource Strain (CARDIA)    Difficulty of Paying Living Expenses: Not hard at all  Food Insecurity: No Food Insecurity (03/26/2023)   Hunger Vital Sign    Worried About Running Out of Food in the Last Year: Never true    Ran Out of Food in the Last Year: Never true  Transportation Needs: No Transportation Needs (03/26/2023)    PRAPARE - Administrator, Civil Service (Medical): No    Lack of Transportation (Non-Medical): No  Physical Activity: Sufficiently Active (03/26/2023)   Exercise Vital Sign    Days of Exercise per Week: 5 days    Minutes of Exercise per Session: 30 min  Stress: No Stress Concern Present (03/26/2023)   [employer] of Occupational Health - Occupational Stress Questionnaire    Feeling of Stress : Not at all  Social Connections: Socially Isolated (03/26/2023)   Social Connection and Isolation Panel [NHANES]    Frequency of Communication with Friends and Family: More than three times a week    Frequency of Social Gatherings with Friends and Family: More than three times a week    Attends Religious Services: Never    Database Administrator or Organizations: No    Attends Banker Meetings: Never    Marital Status: Widowed    Tobacco Counseling Counseling given: Not Answered Tobacco comments: quit 40 years   Clinical Intake:  Pre-visit preparation completed: Yes  Pain : No/denies pain     BMI - recorded: 45.9 Nutritional Status: BMI > 30  Obese Nutritional Risks: None Diabetes: Yes CBG done?: No Did pt. bring in CBG monitor from home?: No  How often do you need to have someone help you when you read instructions, pamphlets, or other written materials from your doctor or pharmacy?: 1 - Never  Interpreter Needed?: No  Information entered by :: JHONNIE DAS, LPN   Activities of Daily Living    03/26/2023    2:39 PM 02/10/2023    2:41 PM  In your present state of health, do you have any difficulty performing the following activities:  Hearing? 0 0  Vision? 0 0  Difficulty concentrating or making decisions? 0 0  Walking or climbing stairs? 1 1  Comment KNEES   Dressing or bathing? 0 0  Doing errands, shopping? 0 0  Preparing Food and eating ? N   Using the  Toilet? N   In the past six months, have you accidently leaked urine? N   Do you have  problems with loss of bowel control? N   Managing your Medications? N   Managing your Finances? N   Housekeeping or managing your Housekeeping? N     Patient Care Team: Sowles, Krichna, MD as PCP - General (Family Medicine) Perla Evalene PARAS, MD as PCP - Cardiology (Cardiology) Cindie Ole DASEN, MD as PCP - Electrophysiology (Cardiology) Helon Clotilda LABOR PA-C as Physician Assistant (Urology) Perla Evalene PARAS, MD as Consulting Physician (Cardiology) Marea Selinda RAMAN, MD as Referring Physician (Vascular Surgery) Loreda Hacker, DPM as Consulting Physician (Podiatry) Jaye Fallow, MD as Referring Physician (Ophthalmology)  Indicate any recent Medical Services you may have received from other than [facility] providers in the past year (date may be approximate).     Assessment:   This is a routine wellness examination for Tiburcio.  Hearing/Vision screen Hearing Screening - Comments:: NO AIDS Vision Screening - Comments:: [facility]- DR.[facility]   Goals Addressed             This Visit's Progress    DIET - EAT MORE FRUITS AND VEGETABLES         Depression Screen    03/26/2023    2:36 PM 02/10/2023    2:41 PM 09/24/2022    1:36 PM 05/20/2022    2:01 PM 02/11/2022    9:44 AM 12/30/2021    1:50 PM 08/27/2021    1:44 PM  PHQ 2/9 Scores  PHQ - 2 Score 0 0 0 0 0 0 0  PHQ- 9 Score 0 0 0 0  0 0    Fall Risk    03/26/2023    2:39 PM 02/10/2023    2:41 PM 09/24/2022    1:35 PM 05/20/2022    2:01 PM 02/11/2022    9:44 AM  Fall Risk   Falls in the past year? 0 0 0 0 0  Number falls in past yr: 0 0   0  Injury with Fall? 0 0   0  Risk for fall due to : No Fall Risks Impaired balance/gait Impaired balance/gait Impaired balance/gait No Fall Risks  Follow up Falls prevention discussed;Falls evaluation completed Falls prevention discussed Falls prevention discussed;Education provided;Falls evaluation completed Falls prevention discussed;Education provided;Falls evaluation completed Falls  evaluation completed    MEDICARE RISK AT HOME: Medicare Risk at Home Any stairs in or around the home?: Yes If so, are there any without handrails?: Yes Home free of loose throw rugs in walkways, pet beds, electrical cords, etc?: Yes Adequate lighting in your home to reduce risk of falls?: Yes Life alert?: No Use of a cane, walker or w/c?: Yes (CANE ALL THE TIME) Grab bars in the bathroom?: Yes Shower chair or bench in shower?: No Elevated toilet seat or a handicapped toilet?: Yes  TIMED UP AND GO:  Was the test performed?  No    Cognitive Function:        03/26/2023    2:41 PM 02/11/2022    9:45 AM 11/30/2018   12:11 PM  6CIT Screen  What Year? 0 points 0 points 0 points  What month? 0 points 0 points 0 points  What time? 0 points 0 points 0 points  Count back from 20 0 points 0 points 0 points  Months in reverse 0 points 0 points 0 points  Repeat phrase 0 points 4 points 6 points  Total Score 0 points 4 points  6 points    Immunizations Immunization History  Administered Date(s) Administered   Fluad Quad(high Dose 65+) 12/23/2019, 01/16/2021, 12/30/2021   Influenza, High Dose Seasonal PF 11/27/2015, 11/19/2016, 11/25/2017, 12/17/2022   Influenza,inj,Quad PF,6+ Mos 11/09/2014, 11/30/2018   Moderna SARS-COV2 Booster Vaccination 08/24/2020   PFIZER(Purple Top)SARS-COV-2 Vaccination 04/07/2019, 05/16/2019, 12/16/2019   Pfizer(Comirnaty)Fall Seasonal Vaccine 12 years and older 12/17/2022   Pneumococcal Conjugate-13 11/19/2016   Pneumococcal Polysaccharide-23 11/30/2018   Tdap 02/18/2017    TDAP status: Up to date  Flu Vaccine status: Up to date  Pneumococcal vaccine status: Up to date  Covid-19 vaccine status: Completed vaccines  Qualifies for Shingles Vaccine? Yes   Zostavax completed No   Shingrix Completed?: No.    Education has been provided regarding the importance of this vaccine. Patient has been advised to call insurance company to determine out of pocket  expense if they have not yet received this vaccine. Advised may also receive vaccine at local pharmacy or Health Dept. Verbalized acceptance and understanding.  Screening Tests Health Maintenance  Topic Date Due   FOOT EXAM  01/17/2023   COVID-19 Vaccine (5 - 2024-25 season) 02/11/2023   Zoster Vaccines- Shingrix (1 of 2) 05/12/2023 (Originally 08/30/1959)   OPHTHALMOLOGY EXAM  04/17/2023   HEMOGLOBIN A1C  08/10/2023   Diabetic kidney evaluation - eGFR measurement  02/10/2024   Diabetic kidney evaluation - Urine ACR  02/10/2024   Medicare Annual Wellness (AWV)  03/25/2024   DTaP/Tdap/Td (2 - Td or Tdap) 02/19/2027   Pneumonia Vaccine 61+ Years old  Completed   INFLUENZA VACCINE  Completed   Hepatitis C Screening  Completed   HPV VACCINES  Aged Out    Health Maintenance  Health Maintenance Due  Topic Date Due   FOOT EXAM  01/17/2023   COVID-19 Vaccine (5 - 2024-25 season) 02/11/2023    Colorectal cancer screening: No longer required.   Lung Cancer Screening: (Low Dose CT Chest recommended if Age 5-80 years, 20 pack-year currently smoking OR have quit w/in 15years.) does not qualify.     Additional Screening:  Hepatitis C Screening: does not qualify; Completed 04/20/19  Vision Screening: Recommended annual ophthalmology exams for early detection of glaucoma and other disorders of the eye. Is the patient up to date with their annual eye exam?  Yes  Who is the provider or what is the name of the office in which the patient attends annual eye exams? [facility] If pt is not established with a provider, would they like to be referred to a provider to establish care? No .   Dental Screening: Recommended annual dental exams for proper oral hygiene  Diabetic Foot Exam: Diabetic Foot Exam: Completed 01/16/22  Community Resource Referral / Chronic Care Management: CRR required this visit?  No   CCM required this visit?  No     Plan:     I have personally reviewed and noted the  following in the patient's chart:   Medical and social history Use of alcohol, tobacco or illicit drugs  Current medications and supplements including opioid prescriptions. Patient is not currently taking opioid prescriptions. Functional ability and status Nutritional status Physical activity Advanced directives List of other physicians Hospitalizations, surgeries, and ER visits in previous 12 months Vitals Screenings to include cognitive, depression, and falls Referrals and appointments  In addition, I have reviewed and discussed with patient certain preventive protocols, quality metrics, and best practice recommendations. A written personalized care plan for preventive services as well as general preventive health recommendations  were provided to patient.     Jhonnie GORMAN Das, LPN   [id number]/7974   After Visit Summary: (MyChart) Due to this being a telephonic visit, the after visit summary with patients personalized plan was offered to patient via MyChart   Nurse Notes: NONE

## 2023-03-26 NOTE — Patient Instructions (Addendum)
 Mr. Sittner , Thank you for taking time to come for your Medicare Wellness Visit. I appreciate your ongoing commitment to your health goals. Please review the following plan we discussed and let me know if I can assist you in the future.   Referrals/Orders/Follow-Ups/Clinician Recommendations: NONE  This is a list of the screening recommended for you and due dates:  Health Maintenance  Topic Date Due   Complete foot exam   01/17/2023   COVID-19 Vaccine (5 - 2024-25 season) 02/11/2023   Zoster (Shingles) Vaccine (1 of 2) 05/12/2023*   Eye exam for diabetics  04/17/2023   Hemoglobin A1C  08/10/2023   Yearly kidney function blood test for diabetes  02/10/2024   Yearly kidney health urinalysis for diabetes  02/10/2024   Medicare Annual Wellness Visit  03/25/2024   DTaP/Tdap/Td vaccine (2 - Td or Tdap) 02/19/2027   Pneumonia Vaccine  Completed   Flu Shot  Completed   Hepatitis C Screening  Completed   HPV Vaccine  Aged Out  *Topic was postponed. The date shown is not the original due date.    Advanced directives: (ACP Link)Information on Advanced Care Planning can be found at [place]  Secretary of [facility] Advance Health Care Directives Advance Health Care Directives ([email or web address]   Next Medicare Annual Wellness Visit scheduled for next year: Yes   04/07/24 @ 2:30 PM IN PERSON

## 2023-04-20 ENCOUNTER — Encounter: Payer: Self-pay | Admitting: Podiatry

## 2023-04-20 ENCOUNTER — Ambulatory Visit: Payer: Medicare HMO | Admitting: Podiatry

## 2023-04-20 DIAGNOSIS — M2042 Other hammer toe(s) (acquired), left foot: Secondary | ICD-10-CM

## 2023-04-20 DIAGNOSIS — M79676 Pain in unspecified toe(s): Secondary | ICD-10-CM

## 2023-04-20 DIAGNOSIS — B351 Tinea unguium: Secondary | ICD-10-CM | POA: Diagnosis not present

## 2023-04-20 DIAGNOSIS — E1142 Type 2 diabetes mellitus with diabetic polyneuropathy: Secondary | ICD-10-CM | POA: Diagnosis not present

## 2023-04-20 DIAGNOSIS — E119 Type 2 diabetes mellitus without complications: Secondary | ICD-10-CM | POA: Diagnosis not present

## 2023-04-20 DIAGNOSIS — L84 Corns and callosities: Secondary | ICD-10-CM

## 2023-04-20 DIAGNOSIS — M2041 Other hammer toe(s) (acquired), right foot: Secondary | ICD-10-CM | POA: Diagnosis not present

## 2023-04-20 NOTE — Progress Notes (Signed)
 04/21/2023 3:50 PM   Jordan [name] 1940-04-24 [id number]  Referring provider: Glenard Mire, MD [address] [place],  [place] [address]  Urological history: 1. 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 BPH with LU TS -prostate volume 101 cc on contrast CT 04/2019   3. Family history of prostate cancer -brother has a history of non fatal prostate cancer.  4.  Enhancing right renal mass -Followed by Dr. Cam at [facility] urology  Chief Complaint  Patient presents with   Follow-up   HPI: Jordan [name] is a 83 y.o. male who presents today for ED.  Previous records reviewed.   SHIM 7  He is taking tadalafil  20 mg on demand dosing.  He states he can only get fat.  He does not have an erection firm enough for penetration or intercourse.  He is also cannot ejaculate.  Patient is not having spontaneous erections.  He denies any pain or curvature with erections.     SHIM     Row Name 04/21/23 1531         SHIM: Over the last 6 months:   How do you rate your confidence that you could get and keep an erection? Very Low  Simultaneous filing. User may not have seen previous data.     When you had erections with sexual stimulation, how often were your erections hard enough for penetration (entering your partner)? Almost Never or Never  Simultaneous filing. User may not have seen previous data.     During sexual intercourse, how often were you able to maintain your erection after you had penetrated (entered) your partner? Almost Never or Never  Simultaneous filing. User may not have seen previous data.     During sexual intercourse, how difficult was it to maintain your erection to completion of intercourse? Very Difficult  Simultaneous filing. User may not have seen previous data.     When you attempted sexual intercourse, how often was it satisfactory for you? A Few Times (much less than half the time)  Simultaneous filing. User may not have seen  previous data.       SHIM Total Score   SHIM 7  Simultaneous filing. User may not have seen previous data.              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 Wonda and Dr. Lucas   Unspecified constipation     Surgical History: Past Surgical History:  Procedure Laterality Date   cataract surgery     COLONOSCOPY  03/17/2010   COLONOSCOPY N/A 04/22/2019   Procedure: COLONOSCOPY;  Surgeon: Wilhelmenia Aloha Raddle., MD;  Location: [facility];  Service: Gastroenterology;  Laterality: N/A;   EYE SURGERY  03/17/2009   cataract   HERNIA  REPAIR     INTRAOPERATIVE TRANSTHORACIC ECHOCARDIOGRAM N/A 05/14/2021   Procedure: INTRAOPERATIVE TRANSTHORACIC ECHOCARDIOGRAM;  Surgeon: Wonda Sharper, MD;  Location: [facility] INVASIVE CV LAB;  Service: Open Heart Surgery;  Laterality: N/A;   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   Procedure: PACEMAKER IMPLANT;  Surgeon: Waddell Danelle ORN, MD;  [facility] INV[facility] CV LAB;  Service: Cardiovascular;  Laterality: N/A;   RIGHT HEART CATH AND CORONARY ANGIOGRAPHY Bilateral 02/15/2021   Procedure: RIGHT HEART CATH AND CORONARY ANGIOGRAPHY;  Surgeon: Mady Bruckner, MD;  Location: [facility] INVASIVE CV LAB;  Service: Cardiovascular;  Laterality: Bilateral;   TRANSCATHETER AORTIC VALVE REPLACEMENT, TRANSFEMORAL N/A 05/14/2021   Procedure: Transcatheter Aortic Valve Replacement,  Transfemoral;  Surgeon: Wonda Sharper, MD;  Location: [facility] INVASIVE CV LAB;  Service: Open Heart Surgery;  Laterality: N/A;   UMBILICAL HERNIA REPAIR  03/17/2009    Home Medications:  Allergies as of 04/21/2023       Reactions   Ace Inhibitors Other (See Comments)   angioedema        Medication List        Accurate as of April 21, 2023  3:50 PM. If you have any questions, ask your nurse or doctor.          acetaminophen  325 MG tablet Commonly known as: TYLENOL  Take 2 tablets (650 mg total) by mouth every 4 (four) hours as needed for headache or mild pain.   acetaminophen -codeine 300-30 MG tablet Commonly known as: TYLENOL  #3 Take 1 tablet by mouth every 6 (six) hours as needed.   allopurinol  300 MG tablet Commonly known as: ZYLOPRIM  Take 1 tablet (300 mg total) by mouth daily.   amLODipine  5 MG tablet Commonly known as: NORVASC  Take 1 tablet (5 mg total) by mouth daily.   aspirin  EC 81 MG tablet Take 1 tablet (81 mg total) by mouth daily. Swallow whole.   atorvastatin  80 MG tablet Commonly known as: LIPITOR Take 1 tablet (80 mg total) by mouth at bedtime.   B-D SINGLE USE SWABS REGULAR Pads 1 each by Does not apply route as needed.   ferrous sulfate 325 (65 FE) MG tablet Take 325 mg by mouth daily.   FISH OIL PO Take 1 capsule by mouth 2 (two) times daily.   furosemide  20 MG tablet Commonly known as: LASIX  TAKE 1 TABLET EVERY DAY   glipiZIDE  5 MG 24 hr tablet Commonly known as: GLUCOTROL  XL Take 1 tablet (5 mg total) by mouth daily with breakfast.   latanoprost  0.005 % ophthalmic solution Commonly known as: XALATAN  Place 1 drop into the left eye at bedtime.   metFORMIN  750 MG 24 hr tablet Commonly known as: GLUCOPHAGE -XR Take 2 tablets (1,500 mg total) by mouth daily with breakfast.   metoprolol  succinate 25 MG 24 hr tablet Commonly known as: Toprol  XL Take 1 tablet (25 mg total) by mouth daily.   pioglitazone  15 MG tablet Commonly known  as: ACTOS  Take 1 tablet (15 mg total) by mouth daily.   polyethylene glycol 17 g packet Commonly known as: MIRALAX  / GLYCOLAX  Take 17 g by mouth at bedtime.   tadalafil  20 MG tablet Commonly known as: CIALIS  Take 1 tablet (20 mg total) by mouth daily as needed for erectile dysfunction. What changed: when to take this   True Metrix Air Glucose Meter Devi 1 each by Does not apply route once for 1 dose.   True Metrix Blood Glucose Test test strip Generic drug: glucose blood CHECK BLOOD SUGAR AS DIRECTED  True Metrix Level 1 Low Soln 1 Bottle by In Vitro route once for 1 dose.   TRUEplus Lancets 33G Misc CHECK BLOOD GLUCOSE TWO TIMES DAILY   VITAMIN D3 PO Take 1 tablet by mouth every Monday, Wednesday, and Friday.        Allergies:  Allergies  Allergen Reactions   Ace Inhibitors Other (See Comments)    angioedema    Family History: Family History  Problem Relation Age of Onset   Heart disease Father    Hypertension Father    Diabetes Sister    Diabetes Brother    Prostate cancer Brother    Diabetes Sister    Kidney disease Neg Hx    Kidney cancer Neg Hx    Bladder Cancer Neg Hx    Colon cancer Neg Hx    Esophageal cancer Neg Hx    Inflammatory bowel disease Neg Hx    Liver disease Neg Hx    Pancreatic cancer Neg Hx    Rectal cancer Neg Hx    Stomach cancer Neg Hx     Social History:  reports that he quit smoking about 47 years ago. His smoking use included cigarettes. He started smoking about 58 years ago. He has a 10.7 pack-year smoking history. He has never used smokeless tobacco. He reports that he does not currently use alcohol after a past usage of about 1.0 standard drink of alcohol per week. He reports that he does not use drugs.  ROS: Pertinent ROS in HPI  Physical Exam: BP (!) 166/82   Pulse 90   Constitutional:  Well nourished. Alert and oriented, No acute distress. HEENT: [place] AT, moist mucus membranes.  Trachea midline, no  masses. Cardiovascular: No clubbing, cyanosis, or edema. Respiratory: Normal respiratory effort, no increased work of breathing. Neurologic: Grossly intact, no focal deficits, moving all 4 extremities. Psychiatric: Normal mood and affect.  Laboratory Data: Lab Results  Component Value Date   WBC 4.9 02/10/2023   HGB 12.9 (L) 02/10/2023   HCT 39.4 02/10/2023   MCV 86.6 02/10/2023   PLT 161 02/10/2023    Lab Results  Component Value Date   CREATININE 0.89 02/10/2023    Lab Results  Component Value Date   PSA 1.68 12/23/2019    Lab Results  Component Value Date   HGBA1C 7.6 (A) 02/10/2023    Lab Results  Component Value Date   TSH 1.400 11/21/2022       Component Value Date/Time   CHOL 140 02/10/2023 1512   CHOL 131 08/27/2015 1158   HDL 49 02/10/2023 1512   HDL 40 08/27/2015 1158   CHOLHDL 2.9 02/10/2023 1512   VLDL 20 07/28/2016 1248   LDLCALC 77 02/10/2023 1512    Lab Results  Component Value Date   AST 19 02/10/2023   Lab Results  Component Value Date   ALT 11 02/10/2023  I have reviewed the labs.   Pertinent Imaging: N/A  Assessment & Plan:    1. Erectile dysfunction - I explained to the patient that in order to achieve an erection it takes good functioning of the nervous system (parasympathetic and rs, sympathetic, sensory and motor), good blood flow into the erectile tissue of the penis and a desire to have sex - I explained that conditions like diabetes, hypertension, coronary artery disease, peripheral vascular disease, smoking, alcohol consumption, age, sleep apnea and BPH can diminish the ability to have an erection - we will obtain a serum testosterone  level at this time; if  it is abnormal we will need to repeat the study for confirmation  - we discussed increasing the tadalafil  to 20 mg daily or switching to ICI, he would like to try the tadalafil  20 mg daily, I have sent the prescription to [facility] so he can use the GoodRx coupon  2.  Renal mass -follow by Dr. Cam   3. BPH with LU TS -Continue to manage conservatively  4. Ejaculatory disorder -Will reassess once we get him to the point where he is having satisfactory erections, his prostate size may be interfering with his ability to ejaculate  Return in about 1 month (around 05/19/2023) for SHIM.  These notes generated with voice recognition software. I apologize for typographical errors.  CLOTILDA HELON RIGGERS  [facility] [address]  [address] [place], [place] [address] [phone]

## 2023-04-20 NOTE — Progress Notes (Unsigned)
ANNUAL DIABETIC FOOT EXAM  Subjective: Jordan [name] presents today for annual diabetic foot exam. Chief Complaint  Patient presents with   Diabetes    "Trim my toenails and the corn in between my toes."   Patient confirms h/o diabetes.  Patient denies any h/o foot wounds.  Patient has been diagnosed with neuropathy.  Jordan Cory, MD is patient's PCP. LOV 02/10/2023.  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 Excell Seltzer and Dr. 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   PAD (peripheral artery disease) (HCC) 01/17/2020   Diverticulosis of colon with hemorrhage 05/26/2019   Atherosclerosis of aorta (HCC) 04/26/2019   Rectal bleed 04/22/2019   Chronic bronchitis (HCC) 07/30/2018   Bilateral hydrocele 11/27/2015   Ventral hernia without obstruction or gangrene 11/27/2015   Controlled type 2  diabetes mellitus with microalbuminuria (HCC) 10/26/2015   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   Hyperlipidemia 11/07/2014   Essential hypertension 11/07/2014   Obesity, Class III, BMI 40-49.9 (morbid obesity) (HCC) 11/07/2014   Controlled gout 11/07/2014   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 INVAS[facility] CV LAB;  Service: Open Heart Surgery;  Laterality: N/A;   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   Procedure: PACEMAKER IMPLANT;  Surgeon: Marinus Maw, MD;  Location: [facility] INVASIVE CV LAB;  Service: Cardiovascular;  Laterality: N/A;   RIGHT HEART CATH AND CORONARY ANGIOGRAPHY Bilateral 02/15/2021   Procedure: RIGHT HEART CATH AND CORONARY ANGIOGRAPHY;  Surgeon: Yvonne Kendall, MD;  Location: [facility] INVASIVE CV LAB;  Service: Cardiovascular;  Laterality: Bilateral;   TRANSCATHETER AORTIC VALVE REPLACEMENT, TRANSFEMORAL N/A 05/14/2021   Procedure: Transcatheter Aortic Valve Replacement, Transfemoral;  Surgeon: Tonny Bollman, MD;  Location: [facility] INVASIVE CV  LAB;  Service: Open Heart Surgery;  Laterality: N/A;   UMBILICAL HERNIA REPAIR  03/17/2009   Current Outpatient Medications on File Prior to Visit  Medication Sig Dispense Refill   acetaminophen (TYLENOL) 325 MG tablet Take 2 tablets (650 mg total) by mouth every 4  (four) hours as needed for headache or mild pain.     acetaminophen-codeine (TYLENOL #3) 300-30 MG tablet Take 1 tablet by mouth every 6 (six) hours as needed.     Alcohol Swabs (B-D SINGLE USE SWABS REGULAR) PADS 1 each by Does not apply route as needed. 100 each 3   allopurinol (ZYLOPRIM) 300 MG tablet Take 1 tablet (300 mg total) by mouth daily. 90 tablet 3   amLODipine (NORVASC) 5 MG tablet Take 1 tablet (5 mg total) by mouth daily. 90 tablet 1   aspirin EC 81 MG tablet Take 1 tablet (81 mg total) by mouth daily. Swallow whole.     atorvastatin (LIPITOR) 80 MG tablet Take 1 tablet (80 mg total) by mouth at bedtime. 90 tablet 3   Cholecalciferol (VITAMIN D3 PO) Take 1 tablet by mouth every Monday, Wednesday, and Friday.     ferrous sulfate 325 (65 FE) MG tablet Take 325 mg by mouth daily.     furosemide (LASIX) 20 MG tablet TAKE 1 TABLET EVERY DAY 90 tablet 3   glipiZIDE (GLUCOTROL XL) 5 MG 24 hr tablet Take 1 tablet (5 mg total) by mouth daily with breakfast. 90 tablet 1   latanoprost (XALATAN) 0.005 % ophthalmic solution Place 1 drop into the left eye at bedtime.      metFORMIN (GLUCOPHAGE-XR) 750 MG 24 hr tablet Take 2 tablets (1,500 mg total) by mouth daily with breakfast. 180 tablet 1   metoprolol succinate (TOPROL XL) 25 MG 24 hr tablet Take 1 tablet (25 mg total) by mouth daily. 90 tablet 3   Omega-3 Fatty Acids (FISH OIL PO) Take 1 capsule by mouth 2 (two) times daily.      pioglitazone (ACTOS) 15 MG tablet Take 1 tablet (15 mg total) by mouth daily. 90 tablet 1   polyethylene glycol (MIRALAX / GLYCOLAX) packet Take 17 g by mouth at bedtime.      tadalafil (CIALIS) 20 MG tablet Take 1 tablet (20 mg total) by mouth every three (3) days as needed for erectile dysfunction. 30 tablet 0   TRUE METRIX BLOOD GLUCOSE TEST test strip CHECK BLOOD SUGAR AS DIRECTED 200 strip 3   TRUEplus Lancets 33G MISC CHECK BLOOD GLUCOSE TWO TIMES DAILY 200 each 3   Blood Glucose Calibration (TRUE METRIX LEVEL  1) Low SOLN 1 Bottle by In Vitro route once for 1 dose. 1 each 0   Blood Glucose Monitoring Suppl (TRUE METRIX AIR GLUCOSE METER) DEVI 1 each by Does not apply route once for 1 dose. 1 each 0   No current facility-administered medications on file prior to visit.    Allergies  Allergen Reactions   Ace Inhibitors Other (See Comments)    angioedema   Social History   Occupational History   Occupation: retired     Comment: used to [employer]   Tobacco Use   Smoking status: Former    Current packs/day: 0.00    Average packs/day: 1 pack/day for 10.7 years (10.7 ttl pk-yrs)    Types: Cigarettes    Start date: 03/17/1965    Quit date: 11/27/1975    Years since quitting: 47.4   Smokeless tobacco: Never   Tobacco comments:  quit 40 years  Vaping Use   Vaping status: Never Used  Substance and Sexual Activity   Alcohol use: Not Currently    Alcohol/week: 1.0 standard drink of alcohol    Types: 1 Standard drinks or equivalent per week    Comment: socially - 1 x year   Drug use: No   Sexual activity: Yes    Partners: Female   Family History  Problem Relation Age of Onset   Heart disease Father    Hypertension Father    Diabetes Sister    Diabetes Brother    Prostate cancer Brother    Diabetes Sister    Kidney disease Neg Hx    Kidney cancer Neg Hx    Bladder Cancer Neg Hx    Colon cancer Neg Hx    Esophageal cancer Neg Hx    Inflammatory bowel disease Neg Hx    Liver disease Neg Hx    Pancreatic cancer Neg Hx    Rectal cancer Neg Hx    Stomach cancer Neg Hx    Immunization History  Administered Date(s) Administered   Fluad Quad(high Dose 65+) 12/23/2019, 01/16/2021, 12/30/2021   Influenza, High Dose Seasonal PF 11/27/2015, 11/19/2016, 11/25/2017, 12/17/2022   Influenza,inj,Quad PF,6+ Mos 11/09/2014, 11/30/2018   Moderna SARS-COV2 Booster Vaccination 08/24/2020   PFIZER(Purple Top)SARS-COV-2 Vaccination 04/07/2019, 05/16/2019, 12/16/2019    Pfizer(Comirnaty)Fall Seasonal Vaccine 12 years and older 12/17/2022   Pneumococcal Conjugate-13 11/19/2016   Pneumococcal Polysaccharide-23 11/30/2018   Tdap 02/18/2017     Review of Systems: Negative except as noted in the HPI.   Objective: There were no vitals filed for this visit.  Jordan [name] is a pleasant 83 y.o. male in NAD. AAO X 3.  Title   Diabetic Foot Exam - detailed Date & Time: 04/20/2023  1:15 PM Diabetic Foot exam was performed with the following findings: Yes  Visual Foot Exam completed.: Yes  Is there a history of foot ulcer?: No Is there a foot ulcer now?: No Is there swelling?: Yes (Comment: bilateral ankles) Is there elevated skin temperature?: No Is there abnormal foot shape?: Yes (Comment: hammertoes b/l) Is there a claw toe deformity?: No Are the toenails long?: Yes Are the toenails thick?: Yes Are the toenails ingrown?: No Is the skin thin, fragile, shiny and hairless?": No Normal Range of Motion?: Yes Is there foot or ankle muscle weakness?: No Do you have pain in calf while walking?: No Are the shoes appropriate in style and fit?: Yes Can the patient see the bottom of their feet?: No Pulse Foot Exam completed.: Yes   Right Posterior Tibialis: Diminished Left posterior Tibialis: Diminished   Right Dorsalis Pedis: Diminished Left Dorsalis Pedis: Diminished     Sensory Foot Exam Completed.: Yes Semmes-Weinstein Monofilament Test "+" means "has sensation" and "-" means "no sensation"   R Site 1-Great Toe: Pos L Site 1-Great Toe: Pos   R Site 4: Pos L Site 4: Pos   R site 5: Pos L Site 5: Pos  R Site 6: Pos L Site 6: Pos     Image components are not supported.   Image components are not supported. Image components are not supported.  [place] Right vibratory: present Left vibratory: present  Comments Hyperkeratotic lesion(s) medial PIPJ of R 2nd toe.  No erythema, no edema, no drainage, no fluctuance.      Lab Results  Component  Value Date   HGBA1C 7.6 (A) 02/10/2023   ADA Risk Categorization: Low Risk :  Patient has all of the following: Intact protective sensation No prior foot ulcer  No severe deformity Pedal pulses present  Assessment: 1. 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 Corns   3. Acquired hammertoes of both feet   4. Diabetic polyneuropathy associated with type 2 diabetes mellitus (HCC)   5. Encounter for diabetic foot exam [facility])     Plan: -Patient was evaluated today. All questions/concerns addressed on today's visit. -Diabetic foot examination performed today. -Patient to continue soft, supportive shoe gear daily. -Toenails 1-5 b/l were debrided in length and girth with sterile nail nippers and dremel without iatrogenic bleeding.  -Corn(s) R 2nd toe pared utilizing sterile scalpel blade without complication or incident. Total number debrided=1. -Patient/POA to call should there be question/concern in the interim. Return in about 3 months (around 07/18/2023).  Freddie Breech, DPM      [place] LOCATION: [address] [place] [address]                   Office [phone]   [facility] LOCATION: [address] [place], [place] [address] Office [phone]

## 2023-04-21 ENCOUNTER — Encounter: Payer: Self-pay | Admitting: Urology

## 2023-04-21 ENCOUNTER — Ambulatory Visit: Payer: Self-pay | Admitting: Urology

## 2023-04-21 VITALS — BP 166/82 | HR 90

## 2023-04-21 DIAGNOSIS — N529 Male erectile dysfunction, unspecified: Secondary | ICD-10-CM

## 2023-04-21 DIAGNOSIS — N5319 Other ejaculatory dysfunction: Secondary | ICD-10-CM | POA: Diagnosis not present

## 2023-04-21 DIAGNOSIS — N138 Other obstructive and reflux uropathy: Secondary | ICD-10-CM

## 2023-04-21 DIAGNOSIS — N401 Enlarged prostate with lower urinary tract symptoms: Secondary | ICD-10-CM

## 2023-04-21 DIAGNOSIS — N2889 Other specified disorders of kidney and ureter: Secondary | ICD-10-CM

## 2023-04-21 MED ORDER — TADALAFIL 20 MG PO TABS: 20.0000 mg | ORAL_TABLET | Freq: Every day | ORAL | 3 refills | Status: AC | PRN

## 2023-04-22 LAB — TESTOSTERONE: Testosterone: 269 ng/dL (ref 264–916)

## 2023-04-23 ENCOUNTER — Telehealth: Payer: Self-pay | Admitting: *Deleted

## 2023-04-23 NOTE — Telephone Encounter (Signed)
-----   Message from [facility] sent at 04/22/2023  8:12 AM EST ----- Would you let Jordan [name] know that his testosterone  level returned low?  We will need to have a confirmation of this by drawing a another testosterone  level before 10 AM.  Please schedule this lab appointment for him.

## 2023-04-23 NOTE — Telephone Encounter (Signed)
 Tried calling cell number, VM was full. Called home number and phone just rang, will try again later.

## 2023-05-15 ENCOUNTER — Encounter: Payer: Self-pay | Admitting: Family Medicine

## 2023-05-15 ENCOUNTER — Ambulatory Visit: Payer: Medicare HMO | Admitting: Family Medicine

## 2023-05-15 VITALS — BP 130/72 | HR 99 | Resp 16 | Ht 69.0 in | Wt 318.1 lb

## 2023-05-15 DIAGNOSIS — I152 Hypertension secondary to endocrine disorders: Secondary | ICD-10-CM | POA: Diagnosis not present

## 2023-05-15 DIAGNOSIS — Z7984 Long term (current) use of oral hypoglycemic drugs: Secondary | ICD-10-CM | POA: Diagnosis not present

## 2023-05-15 DIAGNOSIS — E1159 Type 2 diabetes mellitus with other circulatory complications: Secondary | ICD-10-CM

## 2023-05-15 NOTE — Progress Notes (Signed)
 Name: PRAKASH KIMBERLING   MRN: [id number]    DOB: [birth year]   Date:82/28/2025       Progress Note  Subjective  Chief Complaint  Chief Complaint  Patient presents with   Medical Management of Chronic Issues   HPI   Type 2 diabetes associated with HTN, dyslipidemia, obesity: he does not have a pill box, last visit his A1C was up to 7.6 % and we adjusted Metformin dose to 1500 mg daily , however he has been taking one daily. Advised to continue all other medication and adjust dose so we can recheck A1C on his next visit. 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   PAD (peripheral artery disease) (HCC) 01/17/2020   Diverticulosis of colon with hemorrhage 05/26/2019   Atherosclerosis of aorta (HCC) 04/26/2019   Rectal bleed 04/22/2019   Chronic bronchitis (HCC) 07/30/2018   Bilateral hydrocele 11/27/2015   Ventral hernia without obstruction or gangrene 11/27/2015   Controlled type 2 diabetes mellitus with microalbuminuria (HCC) 10/26/2015   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   Hyperlipidemia 11/07/2014   Essential hypertension 11/07/2014   Obesity, Class III, BMI 40-49.9 (morbid obesity) (HCC) 11/07/2014   Controlled gout 11/07/2014   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   Constipation  10/26/2012   Incisional hernia, without obstruction or gangrene 10/26/2012   Left inguinal hernia     Past Surgical History:  Procedure Laterality Date   cataract surgery     COLONOSCOPY  03/17/2010   COLONOSCOPY N/A 04/22/2019   Procedure: COLONOSCOPY;  Surgeon: Lemar Lofty., MD;  Location: [facility];  Service: Gastroenterology;  Laterality: N/A;   EYE SURGERY  03/17/2009   cataract   HERNIA REPAIR     INTRAOPERATIVE TRANSTHORACIC ECHOCARDIOGRAM N/A 05/14/2021   Procedure: INTRAOPERATIVE TRANSTHORACIC ECHOCARDIOGRAM;  Surgeon: Tonny Bollman, MD;  Location: [facility] INVASIVE CV LAB;  Service: Open Heart Surgery;  Laterality: N/A;   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   Procedure: PACEMAKER IMPLANT;  Surgeon: Marinus Maw, MD;  Location: [facility] INVASIVE CV LAB;  Service: Cardiovascular;  Laterality: N/A;   RIGHT HEART CATH AND CORONARY ANGIOGRAPHY Bilateral 02/15/2021   Procedure: RIGHT HEART CATH AND CORONARY ANGIOGRAPHY;  Surgeon: Yvonne Kendall, MD;  Location: [facility] INVASIVE CV LAB;  Service: Cardiovascular;  Laterality: Bilateral;   TRANSCATHETER AORTIC VALVE REPLACEMENT, TRANSFEMORAL N/A 05/14/2021   Procedure: Transcatheter Aortic Valve Replacement, Transfemoral;  Surgeon: Tonny Bollman, MD;  Location: [facility] INVASIVE CV LAB;  Service: Open Heart Surgery;  Laterality: N/A;   UMBILICAL HERNIA REPAIR  03/17/2009    Family History  Problem Relation Age of Onset   Heart disease Father    Hypertension Father    Diabetes Sister    Diabetes Brother    Prostate cancer Brother    Diabetes Sister    Kidney disease Neg Hx    Kidney cancer Neg Hx  Bladder Cancer Neg Hx    Colon cancer Neg Hx    Esophageal cancer Neg Hx    Inflammatory bowel disease Neg Hx    Liver disease Neg Hx    Pancreatic cancer Neg Hx    Rectal cancer Neg Hx    Stomach cancer Neg Hx     Social History   Tobacco Use   Smoking status: Former    Current packs/day: 0.00    Average packs/day: 1 pack/day  for 10.7 years (10.7 ttl pk-yrs)    Types: Cigarettes    Start date: 03/17/1965    Quit date: 11/27/1975    Years since quitting: 47.4   Smokeless tobacco: Never   Tobacco comments:    quit 40 years  Substance Use Topics   Alcohol use: Not Currently    Alcohol/week: 1.0 standard drink of alcohol    Types: 1 Standard drinks or equivalent per week    Comment: socially - 1 x year     Current Outpatient Medications:    acetaminophen (TYLENOL) 325 MG tablet, Take 2 tablets (650 mg total) by mouth every 4 (four) hours as needed for headache or mild pain., Disp: , Rfl:    acetaminophen-codeine (TYLENOL #3) 300-30 MG tablet, Take 1 tablet by mouth every 6 (six) hours as needed., Disp: , Rfl:    Alcohol Swabs (B-D SINGLE USE SWABS REGULAR) PADS, 1 each by Does not apply route as needed., Disp: 100 each, Rfl: 3   allopurinol (ZYLOPRIM) 300 MG tablet, Take 1 tablet (300 mg total) by mouth daily., Disp: 90 tablet, Rfl: 3   amLODipine (NORVASC) 5 MG tablet, Take 1 tablet (5 mg total) by mouth daily., Disp: 90 tablet, Rfl: 1   aspirin EC 81 MG tablet, Take 1 tablet (81 mg total) by mouth daily. Swallow whole., Disp: , Rfl:    atorvastatin (LIPITOR) 80 MG tablet, Take 1 tablet (80 mg total) by mouth at bedtime., Disp: 90 tablet, Rfl: 3   Cholecalciferol (VITAMIN D3 PO), Take 1 tablet by mouth every Monday, Wednesday, and Friday., Disp: , Rfl:    ferrous sulfate 325 (65 FE) MG tablet, Take 325 mg by mouth daily., Disp: , Rfl:    furosemide (LASIX) 20 MG tablet, TAKE 1 TABLET EVERY DAY, Disp: 90 tablet, Rfl: 3   glipiZIDE (GLUCOTROL XL) 5 MG 24 hr tablet, Take 1 tablet (5 mg total) by mouth daily with breakfast., Disp: 90 tablet, Rfl: 1   latanoprost (XALATAN) 0.005 % ophthalmic solution, Place 1 drop into the left eye at bedtime. , Disp: , Rfl:    metFORMIN (GLUCOPHAGE-XR) 750 MG 24 hr tablet, Take 2 tablets (1,500 mg total) by mouth daily with breakfast., Disp: 180 tablet, Rfl: 1   metoprolol succinate  (TOPROL XL) 25 MG 24 hr tablet, Take 1 tablet (25 mg total) by mouth daily., Disp: 90 tablet, Rfl: 3   Omega-3 Fatty Acids (FISH OIL PO), Take 1 capsule by mouth 2 (two) times daily. , Disp: , Rfl:    pioglitazone (ACTOS) 15 MG tablet, Take 1 tablet (15 mg total) by mouth daily., Disp: 90 tablet, Rfl: 1   polyethylene glycol (MIRALAX / GLYCOLAX) packet, Take 17 g by mouth at bedtime. , Disp: , Rfl:    tadalafil (CIALIS) 20 MG tablet, Take 1 tablet (20 mg total) by mouth daily as needed for erectile dysfunction., Disp: 30 tablet, Rfl: 3   TRUE METRIX BLOOD GLUCOSE TEST test strip, CHECK BLOOD SUGAR AS DIRECTED, Disp: 200 strip, Rfl:  3   TRUEplus Lancets 33G MISC, CHECK BLOOD GLUCOSE TWO TIMES DAILY, Disp: 200 each, Rfl: 3   Blood Glucose Calibration (TRUE METRIX LEVEL 1) Low SOLN, 1 Bottle by In Vitro route once for 1 dose., Disp: 1 each, Rfl: 0   Blood Glucose Monitoring Suppl (TRUE METRIX AIR GLUCOSE METER) DEVI, 1 each by Does not apply route once for 1 dose., Disp: 1 each, Rfl: 0  Allergies  Allergen Reactions   Ace Inhibitors Other (See Comments)    angioedema    I personally reviewed active problem list, medication list, allergies with the patient/caregiver today.   ROS  Ten systems reviewed and is negative except as mentioned in HPI    Objective  Vitals:   05/15/23 1503  BP: 130/72  Pulse: 99  Resp: 16  SpO2: 96%  Weight: (!) 318 lb 1.6 oz (144.3 kg)  Height: 5\' 9"  (1.753 m)    Body mass index is 46.98 kg/m.  Physical Exam  Constitutional: Patient appears well-developed and well-nourished. Obese  No distress.  HEENT: head atraumatic, normocephalic, pupils equal and reactive to light, neck supple Cardiovascular: Normal rate, regular rhythm and normal heart sounds.  No murmur heard. No BLE edema. Pulmonary/Chest: Effort normal and breath sounds normal. No respiratory distress. Abdominal: Soft.  There is no tenderness. Psychiatric: Patient has a normal mood and affect.  behavior is normal. Judgment and thought content normal.   Recent Results (from the past 2160 hours)  CUP PACEART REMOTE DEVICE CHECK     Status: None   Collection Time: 02/18/23  5:39 AM  Result Value Ref Range   Date Time Interrogation Session [id number]    Pulse Generator Manufacturer MERM    Pulse Gen Model [id number] Azure XT DR MRI    Pulse Gen Serial Number [id number]    Cl[facility]    Implantable Pulse Generator Type Implantable Pulse Generator    Implantable Pulse Generator Implant Date [id number]    Implantable Lead Manufacturer MERM    Implantable Lead Model 3830 SelectSecure MRI SureScan    Implantable Lead Serial Number [id number]    Implantable Lead Implant Date 57846962    Implantable Lead Location Detail 1 UNKNOWN    Implantable Lead Special Function LBBB    Implantable Lead Location [id number]    Implantable Lead Connection Status [id number]    Implantable Lead Manufacturer MERM    Implantable Lead Model 5076 CapSureFix Novus MRI SureScan    Implantable Lead Serial Number [id number]    Implantable Lead Implant Date [id number]    Implantable Lead Location Detail 1 APPENDAGE    Implantable Lead Location [id number]    Implantable Lead Connection Status [id number]    Lead Channel Setting Sensing Sensitivity 1.2 mV   Lead Channel Setting Pacing Amplitude 1.5 V   Lead Channel Setting Pacing Pulse Width 0.4 ms   Lead Channel Setting Pacing Amplitude 2 V   Zone Setting Status [id number]    Zone Setting Status [phone]    Lead Channel Impedance Value 380 ohm   Lead Channel Impedance Value 323 ohm   Lead Channel Sensing Intrinsic Amplitude 4.5 mV   Lead Channel Sensing Intrinsic Amplitude 4.5 mV   Lead Channel Pacing Threshold Amplitude 0.625 V   Lead Channel Pacing Threshold Pulse Width 0.4 ms   Lead Channel Impedance Value 570 ohm   Lead Channel Impedance Value 399 ohm   Lead Channel Sensing Intrinsic Amplitude 16.875 mV   Lead Channel Sensing Intrinsic Amplitude 16.875 mV  Lead Channel Pacing Threshold Amplitude 1 V   Lead Channel Pacing Threshold Pulse Width 0.4 ms   Battery Status OK    Battery Remaining Longevity 162 mo   Battery Voltage 3.05 V   Brady Statistic RA Percent Paced 23.89 %   Brady Statistic RV Percent Paced 0.05 %   Brady Statistic AP VP Percent 0.01 %   Brady Statistic AS VP Percent 0.03 %   Brady Statistic AP VS Percent 23.79 %   Brady Statistic AS VS Percent 76.17 %  Testosterone     Status: None   Collection Time: 04/21/23  3:23 PM  Result Value Ref Range   Testosterone 269 264 - 916 ng/dL    Comment: Adult male reference interval is based on a population of healthy nonobese males (BMI <30) between 49 and 10 years old. Travison, et.al. JCEM [phone]. PMID: [id number].     Diabetic Foot Exam:     PHQ2/9:    03/26/2023    2:36 PM 02/10/2023    2:41 PM 09/24/2022    1:36 PM 05/20/2022    2:01 PM 02/11/2022    9:44 AM  Depression screen PHQ 2/9  Decreased Interest 0 0 0 0 0  Down, Depressed, Hopeless 0 0 0 0 0  PHQ - 2 Score 0 0 0 0 0  Altered sleeping 0 0 0 0   Tired, decreased energy 0 0 0 0   Change in appetite 0 0 0 0   Feeling bad or failure about yourself  0 0 0 0   Trouble concentrating 0 0 0 0   Moving slowly or fidgety/restless 0 0 0 0   Suicidal thoughts 0 0 0 0   PHQ-9 Score 0 0 0 0     phq 9 is negative  Fall Risk:    03/26/2023    2:39 PM 02/10/2023    2:41 PM 09/24/2022    1:35 PM 05/20/2022    2:01 PM 02/11/2022    9:44 AM  Fall Risk   Falls in the past year? 0 0 0 0 0  Number falls in past yr: 0 0   0  Injury with Fall? 0 0   0  Risk for fall due to : No Fall Risks Impaired balance/gait Impaired balance/gait Impaired balance/gait No Fall Risks  Follow up Falls prevention discussed;Falls evaluation completed Falls prevention discussed Falls prevention discussed;Education provided;Falls evaluation completed Falls prevention discussed;Education provided;Falls evaluation completed Falls evaluation  completed     Assessment & Plan  1. Hypertension associated with diabetes (HCC) (Primary)  He will start Meformin take two daily    Return in one month for regular follow up and labs

## 2023-05-15 NOTE — Patient Instructions (Signed)
 Take 2 Metformin tablets 750 mg daily

## 2023-05-18 ENCOUNTER — Ambulatory Visit: Payer: Medicare HMO | Admitting: Family Medicine

## 2023-05-18 NOTE — Progress Notes (Unsigned)
 05/19/2023 3:54 PM   Jordan [name] 1941/03/03 [id number]  Referring provider: Alba Cory, MD [address] [place],  [place] [address]  Urological history: 1. 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 BPH with LU TS -prostate volume 101 cc on contrast CT 04/2019   3. Family history of prostate cancer -brother has a history of non fatal prostate cancer.  4.  Enhancing right renal mass -Followed by Dr. Marlou Porch at [facility] urology  Chief Complaint  Patient presents with   Follow-up   HPI: Jordan [name] is a 83 y.o. male who presents today for one month follow up.   Previous records reviewed.   At his visit on 04/21/2023, SHIM 7.  He is taking tadalafil 20 mg on demand dosing.  He states he can only get "fat."  He does not have an erection firm enough for penetration or intercourse.  He is also cannot ejaculate.  Patient is not having spontaneous erections.  He denies any pain or curvature with erections.   He is given a trial of tadalafil 20 mg daily.  His testosterone level was normal at 269 in the afternoon.  SHIM 7  He saw no difference with taking the tadalafil 20 mg daily.   SHIM     Row Name 05/19/23 1533         SHIM: Over the last 6 months:   How do you rate your confidence that you could get and keep an erection? Very Low     When you had erections with sexual stimulation, how often were your erections hard enough for penetration (entering your partner)? Almost Never or Never     During sexual intercourse, how often were you able to maintain your erection after you had penetrated (entered) your partner? Almost Never or Never     During sexual intercourse, how difficult was it to maintain your erection to completion of intercourse? Very Difficult     When you attempted sexual intercourse, how often was it satisfactory for you? 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 Excell Seltzer and Dr. Laneta Simmers   Unspecified constipation     Surgical History: Past Surgical History:  Procedure Laterality Date   cataract surgery     COLONOSCOPY  03/17/2010   COLONOSCOPY N/A 04/22/2019   Procedure: COLONOSCOPY;  Surgeon: Lemar Lofty., MD;  Location: [facility];  Service: Gastroenterology;  Laterality: N/A;   EYE SURGERY  03/17/2009   cataract   HERNIA REPAIR     INTRAOPERATIVE TRANSTHORACIC ECHOCARDIOGRAM N/A 05/14/2021   Procedure: INTRAOPERATIVE  TRANSTHORACIC ECHOCARDIOGRAM;  Surgeon: Tonny Bollman, MD;  Location: [facility] INVASIVE CV LAB;  Service: Open Heart Surgery;  Laterality: N/A;   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   Procedure: PACEMAKER IMPLANT;  Surgeon: Marinus Maw, MD;  Location: [facility] INVASIVE CV LAB;  Service: Cardiovascular;  Laterality: N/A;   RIGHT HEART CATH AND CORONARY ANGIOGRAPHY Bilateral 02/15/2021   Procedure: RIGHT HEART CATH AND CORONARY ANGIOGRAPHY;  Surgeon: Yvonne Kendall, MD;  Location: [facility] INVASIVE CV LAB;  Service: Cardiovascular;  Laterality: Bilateral;   TRANSCATHETER AORTIC VALVE REPLACEMENT, TRANSFEMORAL N/A 05/14/2021   Procedure: Transcatheter Aortic Valve Replacement, Transfemoral;  Surgeon: Tonny Bollman, MD;  Location: [facility] INVASIVE CV LAB;  Service: Open Heart Surgery;  Laterality: N/A;    UMBILICAL HERNIA REPAIR  03/17/2009    Home Medications:  Allergies as of 05/19/2023       Reactions   Ace Inhibitors Other (See Comments)   angioedema        Medication List        Accurate as of May 19, 2023  3:54 PM. If you have any questions, ask your nurse or doctor.          acetaminophen 325 MG tablet Commonly known as: TYLENOL Take 2 tablets (650 mg total) by mouth every 4 (four) hours as needed for headache or mild pain.   acetaminophen-codeine 300-30 MG tablet Commonly known as: TYLENOL #3 Take 1 tablet by mouth every 6 (six) hours as needed.   allopurinol 300 MG tablet Commonly known as: ZYLOPRIM Take 1 tablet (300 mg total) by mouth daily.   amLODipine 5 MG tablet Commonly known as: NORVASC Take 1 tablet (5 mg total) by mouth daily.   aspirin EC 81 MG tablet Take 1 tablet (81 mg total) by mouth daily. Swallow whole.   atorvastatin 80 MG tablet Commonly known as: LIPITOR Take 1 tablet (80 mg total) by mouth at bedtime.   B-D SINGLE USE SWABS REGULAR Pads 1 each by Does not apply route as needed.   ferrous sulfate 325 (65 FE) MG tablet Take 325 mg by mouth daily.   FISH OIL PO Take 1 capsule by mouth 2 (two) times daily.   furosemide 20 MG tablet Commonly known as: LASIX TAKE 1 TABLET EVERY DAY   glipiZIDE 5 MG 24 hr tablet Commonly known as: GLUCOTROL XL Take 1 tablet (5 mg total) by mouth daily with breakfast.   latanoprost 0.005 % ophthalmic solution Commonly known as: XALATAN Place 1 drop into the left eye at bedtime.   metFORMIN 750 MG 24 hr tablet Commonly known as: GLUCOPHAGE-XR Take 2 tablets (1,500 mg total) by mouth daily with breakfast.   metoprolol succinate 25 MG 24 hr tablet Commonly known as: Toprol XL Take 1 tablet (25 mg total) by mouth daily.   pioglitazone 15 MG tablet Commonly known as: ACTOS Take 1 tablet (15 mg total) by mouth daily.   polyethylene glycol 17 g packet Commonly known as: MIRALAX /  GLYCOLAX Take 17 g by mouth at bedtime.   tadalafil 20 MG tablet Commonly known as: CIALIS Take 1 tablet (20 mg total) by mouth daily as needed for erectile dysfunction.   True Metrix Air Glucose Meter Devi 1 each by Does not apply route once for 1 dose.   True Metrix Blood Glucose Test test strip Generic drug: glucose blood CHECK BLOOD SUGAR AS DIRECTED   True Metrix Level 1 Low Soln 1 Bottle by In Vitro route once for 1 dose.   TRUEplus Lancets  33G Misc CHECK BLOOD GLUCOSE TWO TIMES DAILY   VITAMIN D3 PO Take 1 tablet by mouth every Monday, Wednesday, and Friday.        Allergies:  Allergies  Allergen Reactions   Ace Inhibitors Other (See Comments)    angioedema    Family History: Family History  Problem Relation Age of Onset   Heart disease Father    Hypertension Father    Diabetes Sister    Diabetes Brother    Prostate cancer Brother    Diabetes Sister    Kidney disease Neg Hx    Kidney cancer Neg Hx    Bladder Cancer Neg Hx    Colon cancer Neg Hx    Esophageal cancer Neg Hx    Inflammatory bowel disease Neg Hx    Liver disease Neg Hx    Pancreatic cancer Neg Hx    Rectal cancer Neg Hx    Stomach cancer Neg Hx     Social History:  reports that he quit smoking about 47 years ago. His smoking use included cigarettes. He started smoking about 58 years ago. He has a 10.7 pack-year smoking history. He has never used smokeless tobacco. He reports that he does not currently use alcohol after a past usage of about 1.0 standard drink of alcohol per week. He reports that he does not use drugs.  ROS: Pertinent ROS in HPI  Physical Exam: BP (!) 105/49   Pulse 86   Ht 5\' 9"  (1.753 m)   Wt 289 lb (131.1 kg)   BMI 42.68 kg/m   Constitutional:  Well nourished. Alert and oriented, No acute distress. HEENT: [place] AT, moist mucus membranes.  Trachea midline Cardiovascular: No clubbing, cyanosis, or edema. Respiratory: Normal respiratory effort, no increased work of  breathing. Neurologic: Grossly intact, no focal deficits, moving all 4 extremities. Psychiatric: Normal mood and affect.   Laboratory Data: N/A   Pertinent Imaging: N/A  Assessment & Plan:    1. Erectile dysfunction -We discussed trial with [facility], but he deferred -We discussed retesting his testosterone in the morning, but he deferred -He expressed that he is 83 years old and if it works it works and if it does not it does not -He will continue the tadalafil 20 mg daily  2. Renal mass -follow by Dr. Marlou Porch   3. BPH with LU TS -Continue to manage conservatively  4. Ejaculatory disorder -no improvement with the ED on the tadalafil daily, he has deferred any further evaluation or treatment  Return if symptoms worsen or fail to improve.  These notes generated with voice recognition software. I apologize for typographical errors.  Cloretta Ned  [facility] [address]  [address] [place], [place] [address] [phone]

## 2023-05-19 ENCOUNTER — Encounter: Payer: Self-pay | Admitting: Urology

## 2023-05-19 ENCOUNTER — Ambulatory Visit: Payer: Medicare HMO | Admitting: Urology

## 2023-05-19 VITALS — BP 105/49 | HR 86 | Ht 69.0 in | Wt 289.0 lb

## 2023-05-19 DIAGNOSIS — N401 Enlarged prostate with lower urinary tract symptoms: Secondary | ICD-10-CM | POA: Diagnosis not present

## 2023-05-19 DIAGNOSIS — N2889 Other specified disorders of kidney and ureter: Secondary | ICD-10-CM | POA: Diagnosis not present

## 2023-05-19 DIAGNOSIS — N138 Other obstructive and reflux uropathy: Secondary | ICD-10-CM

## 2023-05-19 DIAGNOSIS — N5319 Other ejaculatory dysfunction: Secondary | ICD-10-CM | POA: Diagnosis not present

## 2023-05-19 DIAGNOSIS — N529 Male erectile dysfunction, unspecified: Secondary | ICD-10-CM | POA: Diagnosis not present

## 2023-05-20 ENCOUNTER — Ambulatory Visit (INDEPENDENT_AMBULATORY_CARE_PROVIDER_SITE_OTHER): Payer: Medicare HMO

## 2023-05-20 ENCOUNTER — Telehealth: Payer: Self-pay

## 2023-05-20 DIAGNOSIS — I472 Ventricular tachycardia, unspecified: Secondary | ICD-10-CM

## 2023-05-20 NOTE — Telephone Encounter (Signed)
 It was d/c 01/2023

## 2023-05-20 NOTE — Telephone Encounter (Signed)
 Received a fax from [facility] pharmacy for tamsulosin (FLOMAX) 0.4 MG CAPS capsule [[id number]   Please review

## 2023-05-20 NOTE — Telephone Encounter (Signed)
 No answer unable to leave vm due to being full

## 2023-05-23 LAB — CUP PACEART REMOTE DEVICE CHECK
Battery Remaining Longevity: 159 mo
Battery Voltage: 3.05 V
Brady Statistic AP VP Percent: 0.01 %
Brady Statistic AP VS Percent: 20.52 %
Brady Statistic AS VP Percent: 0.03 %
Brady Statistic AS VS Percent: 79.45 %
Brady Statistic RA Percent Paced: 20.66 %
Brady Statistic RV Percent Paced: 0.04 %
Date Time Interrogation Session: 20250304184938
Implantable Lead Connection Status: 753985
Implantable Lead Connection Status: 753985
Implantable Lead Implant Date: 20230307
Implantable Lead Implant Date: 20230307
Implantable Lead Location: 753859
Implantable Lead Location: 753860
Implantable Lead Model: 3830
Implantable Lead Model: 5076
Implantable Pulse Generator Implant Date: 20230307
Lead Channel Impedance Value: 323 Ohm
Lead Channel Impedance Value: 380 Ohm
Lead Channel Impedance Value: 399 Ohm
Lead Channel Impedance Value: 570 Ohm
Lead Channel Pacing Threshold Amplitude: 0.625 V
Lead Channel Pacing Threshold Amplitude: 1 V
Lead Channel Pacing Threshold Pulse Width: 0.4 ms
Lead Channel Pacing Threshold Pulse Width: 0.4 ms
Lead Channel Sensing Intrinsic Amplitude: 16.75 mV
Lead Channel Sensing Intrinsic Amplitude: 16.75 mV
Lead Channel Sensing Intrinsic Amplitude: 4.625 mV
Lead Channel Sensing Intrinsic Amplitude: 4.625 mV
Lead Channel Setting Pacing Amplitude: 1.5 V
Lead Channel Setting Pacing Amplitude: 2 V
Lead Channel Setting Pacing Pulse Width: 0.4 ms
Lead Channel Setting Sensing Sensitivity: 1.2 mV
Zone Setting Status: 755011
Zone Setting Status: 755011

## 2023-06-12 ENCOUNTER — Ambulatory Visit: Payer: Medicare HMO | Admitting: Family Medicine

## 2023-06-16 ENCOUNTER — Encounter: Payer: Self-pay | Admitting: Family Medicine

## 2023-06-16 ENCOUNTER — Ambulatory Visit (INDEPENDENT_AMBULATORY_CARE_PROVIDER_SITE_OTHER): Payer: Medicare HMO | Admitting: Family Medicine

## 2023-06-16 VITALS — BP 130/74 | HR 96 | Resp 16 | Ht 69.0 in | Wt 313.2 lb

## 2023-06-16 DIAGNOSIS — I77810 Thoracic aortic ectasia: Secondary | ICD-10-CM | POA: Diagnosis not present

## 2023-06-16 DIAGNOSIS — E1129 Type 2 diabetes mellitus with other diabetic kidney complication: Secondary | ICD-10-CM | POA: Diagnosis not present

## 2023-06-16 DIAGNOSIS — N401 Enlarged prostate with lower urinary tract symptoms: Secondary | ICD-10-CM

## 2023-06-16 DIAGNOSIS — I739 Peripheral vascular disease, unspecified: Secondary | ICD-10-CM | POA: Diagnosis not present

## 2023-06-16 DIAGNOSIS — E1159 Type 2 diabetes mellitus with other circulatory complications: Secondary | ICD-10-CM

## 2023-06-16 DIAGNOSIS — R351 Nocturia: Secondary | ICD-10-CM

## 2023-06-16 DIAGNOSIS — Z952 Presence of prosthetic heart valve: Secondary | ICD-10-CM

## 2023-06-16 DIAGNOSIS — I152 Hypertension secondary to endocrine disorders: Secondary | ICD-10-CM

## 2023-06-16 DIAGNOSIS — I700 Atherosclerosis of aorta: Secondary | ICD-10-CM

## 2023-06-16 DIAGNOSIS — M170 Bilateral primary osteoarthritis of knee: Secondary | ICD-10-CM

## 2023-06-16 DIAGNOSIS — R809 Proteinuria, unspecified: Secondary | ICD-10-CM

## 2023-06-16 DIAGNOSIS — M109 Gout, unspecified: Secondary | ICD-10-CM

## 2023-06-16 DIAGNOSIS — J410 Simple chronic bronchitis: Secondary | ICD-10-CM

## 2023-06-16 LAB — POCT GLYCOSYLATED HEMOGLOBIN (HGB A1C): Hemoglobin A1C: 7.6 % — AB (ref 4.0–5.6)

## 2023-06-16 MED ORDER — GLIPIZIDE ER 5 MG PO TB24: 5.0000 mg | ORAL_TABLET | Freq: Every day | ORAL | 1 refills | Status: DC

## 2023-06-16 MED ORDER — PIOGLITAZONE HCL 15 MG PO TABS
15.0000 mg | ORAL_TABLET | Freq: Every day | ORAL | 1 refills | Status: DC
Start: 2023-06-16 — End: 2023-10-15

## 2023-06-16 MED ORDER — AMLODIPINE BESYLATE 5 MG PO TABS: 5.0000 mg | ORAL_TABLET | Freq: Every day | ORAL | 1 refills | Status: DC

## 2023-06-16 NOTE — Progress Notes (Signed)
 Name: Jordan [name]   MRN: [id number]    DOB: [birth year]   Date:06/16/2023       Progress Note  Subjective  Chief Complaint  Chief Complaint  Patient presents with   Medical Management of Chronic Issues   HPI   DM with microalbuminuria , obesity, dyslipidemia and HTN and  ED: he is back on Glipizide 5mg  ER , Metformin 1500 mg daily and pioglitazone 15 mg , A1C is still 7.6 % however he was taking one metformin instead two until last month   He cannot take ACE because of history of angioedema, bp is controlled with Norvasc .  He denies polyphagia, polydipsia but has polyuria - also has BPH and has nocturia, denies recent episodes of hypoglycemia. FSBS 130 range at home    Chronic bronchitis: he used to smoke but quit many years ago, he still has a cough that is productive at times, usually in the  mornings, sputum is yellow and stable. No wheezing   He has some sob with moderate activity    HTN:  he is compliant with mediations,  no chest pain,, he has some SOB with activity .He has lower extremity edema but mild , he was having some dizziness in 2024  and we decreased norvasc from 10 mg to 5 mg ( gradually )  also takes metoprolol 25 mg, tolerated medications well.    Morbid obese : his weight is trending up, he is still walking for 30-45 minutes 5 days a week  with his walking stick. He states he has not been as compliant with his diet. He has been spending more time with his brother in [place] and eats the food he cooks. He has his house in [place] but only here to pay bill and make sure the house is okay   History of Aortic valve stenosis/Asending aorta dilation: under the care of cardiologist - Dr. Mariah Milling,  no syncope or chest pain, had echo normal EF. 55-60  %. He is now also seeing Dr. 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 He had echo done  05/2022  and ascending aorta dilation is stable. Last visit with cardiologist was 07/2022    Atherosclerosis of aorta/Hyperlipidemia : he is on statin therapy, taking aspirin 81 mg daily  last LDL was 77 , explained importance of compliance to get LDL below 70   History of iron deficiency anemia:  secondary to rectal bleeding, he has a personal history of hemorrhoids and diverticulosis, he was released from Dr. Servando Snare .He is still taking iron supplementation and denies pica or sob , last HCT still low but normal iron storage    Gout: taking allopurinol and not on diuretics  Last uric acid was normal . No recent flares .   Claudication lower extremity :  he states no longer walking one hour because he has to take multiple breaks. He has been walking again for 30 minutes no longer having to take a break every time he walks  He was seen by vascular surgeon and felt likely secondary from lumbar spine problems. Discussed risk and benefits of therapy for lumbar spine stenosis and since symptoms only when walking he will continue physical activity for now. Symptoms stable    Gait problems: he has OA on both knees he states symptoms have been stable, he uses a cane or walking  stick    BPH with LUTS, enhancing right renal mass/ED: seen recently by Urologist , taking cialis 20 mg now. 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    History of angina 02/12/2021   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   PAD (peripheral artery disease) (HCC) 01/17/2020   Diverticulosis of colon with hemorrhage 05/26/2019   Atherosclerosis of aorta (HCC) 04/26/2019   Rectal bleed 04/22/2019   Chronic bronchitis (HCC) 07/30/2018   Bilateral hydrocele 11/27/2015   Ventral hernia without obstruction or gangrene 11/27/2015   Controlled type 2 diabetes mellitus with microalbuminuria (HCC) 10/26/2015   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   Hyperlipidemia 11/07/2014   Essential hypertension 11/07/2014   Obesity, Class III, BMI 40-49.9 (morbid obesity) (HCC) 11/07/2014   Controlled gout 11/07/2014   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   Constipation 10/26/2012   Incisional hernia, without obstruction or gangrene 10/26/2012   Left inguinal hernia     Past Surgical History:  Procedure Laterality Date   cataract surgery     COLONOSCOPY  03/17/2010   COLONOSCOPY N/A 04/22/2019   Procedure: COLONOSCOPY;  Surgeon: Lemar Lofty., MD;  Location: [facility];  Service: Gastroenterology;  Laterality: N/A;   EYE SURGERY  03/17/2009   cataract   HERNIA REPAIR     INTRAOPERATIVE TRANSTHORACIC ECHOCARDIOGRAM N/A 05/14/2021   Procedure: INTRAOPERATIVE TRANSTHORACIC ECHOCARDIOGRAM;  Surgeon: Tonny Bollman, MD;  Location: [facility] INVASIVE CV LAB;  Service: Open Heart Surgery;  Laterality: N/A;   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   Procedure: PACEMAKER IMPLANT;  Surgeon: Marinus Maw, MD;  Location: [facility] INVASIVE CV LAB;  Service: Cardiovascular;  Laterality: N/A;   RIGHT HEART CATH AND CORONARY ANGIOGRAPHY Bilateral 02/15/2021   Procedure: RIGHT HEART CATH AND CORONARY ANGIOGRAPHY;  Surgeon: Yvonne Kendall, MD;  Location: [facility] INVASIVE CV LAB;  Service: Cardiovascular;  Laterality: Bilateral;   TRANSCATHETER AORTIC VALVE REPLACEMENT, TRANSFEMORAL  N/A 05/14/2021   Procedure: Transcatheter Aortic Valve Replacement, Transfemoral;  Surgeon: Tonny Bollman, MD;  Location: [facility] INVASIVE CV LAB;  Service: Open Heart Surgery;  Laterality: N/A;   UMBILICAL HERNIA REPAIR  03/17/2009    Family History  Problem Relation Age of Onset   Heart disease Father    Hypertension Father    Diabetes Sister    Diabetes Brother    Prostate cancer Brother    Diabetes Sister    Kidney disease Neg Hx    Kidney cancer Neg Hx    Bladder Cancer Neg Hx    Colon cancer Neg Hx    Esophageal cancer Neg Hx    Inflammatory bowel disease Neg Hx    Liver disease Neg Hx    Pancreatic cancer Neg Hx    Rectal cancer Neg Hx    Stomach cancer Neg Hx     Social History   Tobacco Use   Smoking status: Former  Current packs/day: 0.00    Average packs/day: 1 pack/day for 10.7 years (10.7 ttl pk-yrs)    Types: Cigarettes    Start date: 03/17/1965    Quit date: 11/27/1975    Years since quitting: 47.5   Smokeless tobacco: Never   Tobacco comments:    quit 40 years  Substance Use Topics   Alcohol use: Not Currently    Alcohol/week: 1.0 standard drink of alcohol    Types: 1 Standard drinks or equivalent per week    Comment: socially - 1 x year     Current Outpatient Medications:    acetaminophen (TYLENOL) 325 MG tablet, Take 2 tablets (650 mg total) by mouth every 4 (four) hours as needed for headache or mild pain., Disp: , Rfl:    acetaminophen-codeine (TYLENOL #3) 300-30 MG tablet, Take 1 tablet by mouth every 6 (six) hours as needed., Disp: , Rfl:    Alcohol Swabs (B-D SINGLE USE SWABS REGULAR) PADS, 1 each by Does not apply route as needed., Disp: 100 each, Rfl: 3   allopurinol (ZYLOPRIM) 300 MG tablet, Take 1 tablet (300 mg total) by mouth daily., Disp: 90 tablet, Rfl: 3   amLODipine (NORVASC) 5 MG tablet, Take 1 tablet (5 mg total) by mouth daily., Disp: 90 tablet, Rfl: 1   aspirin EC 81 MG tablet, Take 1 tablet (81 mg total) by mouth daily. Swallow  whole., Disp: , Rfl:    atorvastatin (LIPITOR) 80 MG tablet, Take 1 tablet (80 mg total) by mouth at bedtime., Disp: 90 tablet, Rfl: 3   Cholecalciferol (VITAMIN D3 PO), Take 1 tablet by mouth every Monday, Wednesday, and Friday., Disp: , Rfl:    ferrous sulfate 325 (65 FE) MG tablet, Take 325 mg by mouth daily., Disp: , Rfl:    furosemide (LASIX) 20 MG tablet, TAKE 1 TABLET EVERY DAY, Disp: 90 tablet, Rfl: 3   glipiZIDE (GLUCOTROL XL) 5 MG 24 hr tablet, Take 1 tablet (5 mg total) by mouth daily with breakfast., Disp: 90 tablet, Rfl: 1   latanoprost (XALATAN) 0.005 % ophthalmic solution, Place 1 drop into the left eye at bedtime. , Disp: , Rfl:    metFORMIN (GLUCOPHAGE-XR) 750 MG 24 hr tablet, Take 2 tablets (1,500 mg total) by mouth daily with breakfast., Disp: 180 tablet, Rfl: 1   metoprolol succinate (TOPROL XL) 25 MG 24 hr tablet, Take 1 tablet (25 mg total) by mouth daily., Disp: 90 tablet, Rfl: 3   Omega-3 Fatty Acids (FISH OIL PO), Take 1 capsule by mouth 2 (two) times daily. , Disp: , Rfl:    pioglitazone (ACTOS) 15 MG tablet, Take 1 tablet (15 mg total) by mouth daily., Disp: 90 tablet, Rfl: 1   polyethylene glycol (MIRALAX / GLYCOLAX) packet, Take 17 g by mouth at bedtime. , Disp: , Rfl:    tadalafil (CIALIS) 20 MG tablet, Take 1 tablet (20 mg total) by mouth daily as needed for erectile dysfunction., Disp: 30 tablet, Rfl: 3   TRUE METRIX BLOOD GLUCOSE TEST test strip, CHECK BLOOD SUGAR AS DIRECTED, Disp: 200 strip, Rfl: 3   TRUEplus Lancets 33G MISC, CHECK BLOOD GLUCOSE TWO TIMES DAILY, Disp: 200 each, Rfl: 3   Blood Glucose Calibration (TRUE METRIX LEVEL 1) Low SOLN, 1 Bottle by In Vitro route once for 1 dose., Disp: 1 each, Rfl: 0   Blood Glucose Monitoring Suppl (TRUE METRIX AIR GLUCOSE METER) DEVI, 1 each by Does not apply route once for 1 dose., Disp: 1 each, Rfl: 0  Allergies  Allergen Reactions   Ace Inhibitors Other (See Comments)    angioedema    I personally reviewed  active problem list, medication list, allergies with the patient/caregiver today.   ROS  Ten systems reviewed and is negative except as mentioned in HPI    Objective Physical Exam Constitutional: Patient appears well-developed and well-nourished. Obese  No distress.  HEENT: head atraumatic, normocephalic, pupils equal and reactive to light, neck supple Cardiovascular: Normal rate, regular rhythm and normal heart sounds.  No murmur heard. Trace  BLE edema. Pulmonary/Chest: Effort normal and breath sounds normal. No respiratory distress. Abdominal: Soft.  There is no tenderness. Psychiatric: Patient has a normal mood and affect. behavior is normal. Judgment and thought content normal.   Vitals:   06/16/23 1336  Pulse: 96  Resp: 16  SpO2: 98%  Weight: (!) 313 lb 3.2 oz (142.1 kg)  Height: 5\' 9"  (1.753 m)    Body mass index is 46.25 kg/m.  Recent Results (from the past 2160 hours)  Testosterone     Status: None   Collection Time: 04/21/23  3:23 PM  Result Value Ref Range   Testosterone 269 264 - 916 ng/dL    Comment: Adult male reference interval is based on a population of healthy nonobese males (BMI <30) between 26 and 82 years old. Travison, et.al. JCEM [phone]. PMID: [id number].   CUP PACEART REMOTE DEVICE CHECK     Status: None   Collection Time: 05/19/23  6:49 PM  Result Value Ref Range   Date Time Interrogation Session [id number]    Pulse Generator Manufacturer MERM    Pulse Gen Model [id number] Azure XT DR MRI    Pulse Gen Serial Number [id number]    Clinic [facility]    Implantable Pulse Generator Type Implantable Pulse Generator    Implantable Pulse Generator Implant Date [id number]    Implantable Lead Manufacturer MERM    Implantable Lead Model 3830 SelectSecure MRI SureScan    Implantable Lead Serial Number [id number]    Implantable Lead Implant Date [id number]    Implantable Lead Location Detail 1 UNKNOWN    Implantable Lead Special Function  LBBB    Implantable Lead Location [id number]    Implantable Lead Connection Status [id number]    Implantable Lead Manufacturer MERM    Implantable Lead Model 5076 CapSureFix Novus MRI SureScan    Implantable Lead Serial Number [id number]    Implantable Lead Implant Date [id number]    Implantable Lead Location Detail 1 APPENDAGE    Implantable Lead Location [id number]    Implantable Lead Connection Status [id number]    Lead Channel Setting Sensing Sensitivity 1.2 mV   Lead Channel Setting Pacing Amplitude 1.5 V   Lead Channel Setting Pacing Pulse Width 0.4 ms   Lead Channel Setting Pacing Amplitude 2 V   Zone Setting Status [id number]    Zone Setting Status [phone]    Lead Channel Impedance Value 380 ohm   Lead Channel Impedance Value 323 ohm   Lead Channel Sensing Intrinsic Amplitude 4.625 mV   Lead Channel Sensing Intrinsic Amplitude 4.625 mV   Lead Channel Pacing Threshold Amplitude 0.625 V   Lead Channel Pacing Threshold Pulse Width 0.4 ms   Lead Channel Impedance Value 570 ohm   Lead Channel Impedance Value 399 ohm   Lead Channel Sensing Intrinsic Amplitude 16.75 mV   Lead Channel Sensing Intrinsic Amplitude 16.75 mV   Lead Channel Pacing Threshold Amplitude 1 V   Lead Channel Pacing Threshold Pulse Width 0.4  ms   Battery Status OK    Battery Remaining Longevity 159 mo   Battery Voltage 3.05 V   Brady Statistic RA Percent Paced 20.66 %   Brady Statistic RV Percent Paced 0.04 %   Brady Statistic AP VP Percent 0.01 %   Brady Statistic AS VP Percent 0.03 %   Brady Statistic AP VS Percent 20.52 %   Brady Statistic AS VS Percent 79.45 %    Diabetic Foot Exam:     PHQ2/9:    06/16/2023    1:36 PM 03/26/2023    2:36 PM 02/10/2023    2:41 PM 09/24/2022    1:36 PM 05/20/2022    2:01 PM  Depression screen PHQ 2/9  Decreased Interest 0 0 0 0 0  Down, Depressed, Hopeless 0 0 0 0 0  PHQ - 2 Score 0 0 0 0 0  Altered sleeping 0 0 0 0 0  Tired, decreased energy 0 0 0 0 0  Change in appetite 0 0 0 0 0   Feeling bad or failure about yourself  0 0 0 0 0  Trouble concentrating 0 0 0 0 0  Moving slowly or fidgety/restless 0 0 0 0 0  Suicidal thoughts 0 0 0 0 0  PHQ-9 Score 0 0 0 0 0  Difficult doing work/chores Not difficult at all        phq 9 is negative  Fall Risk:    06/16/2023    1:36 PM 03/26/2023    2:39 PM 02/10/2023    2:41 PM 09/24/2022    1:35 PM 05/20/2022    2:01 PM  Fall Risk   Falls in the past year? 0 0 0 0 0  Number falls in past yr: 0 0 0    Injury with Fall? 0 0 0    Risk for fall due to : No Fall Risks No Fall Risks Impaired balance/gait Impaired balance/gait Impaired balance/gait  Follow up Falls prevention discussed;Education provided;Falls evaluation completed Falls prevention discussed;Falls evaluation completed Falls prevention discussed Falls prevention discussed;Education provided;Falls evaluation completed Falls prevention discussed;Education provided;Falls evaluation completed     Assessment & Plan  1. Controlled type 2 diabetes mellitus with microalbuminuria, without long-term current use of insulin (HCC) (Primary)  - POCT glycosylated hemoglobin (Hb A1C) - glipiZIDE (GLUCOTROL XL) 5 MG 24 hr tablet; Take 1 tablet (5 mg total) by mouth daily with breakfast.  Dispense: 90 tablet; Refill: 1 - pioglitazone (ACTOS) 15 MG tablet; Take 1 tablet (15 mg total) by mouth daily.  Dispense: 90 tablet; Refill: 1  2. Claudication of both lower extremities (HCC)  Stable, continue regular activity   3. Atherosclerosis of aorta (HCC)  On statin therapy and aspirin  4. 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HCC)  Monitored by cardiologist   5. Simple chronic bronchitis (HCC)  stable  6. Benign prostatic hyperplasia with nocturia  Keep visits with urologist   7. S/P TAVR (transcatheter aortic valve replacement)  stable  8. Primary osteoarthritis of both knees  Taking tylenol prn   9. Controlled gout  Controlled  10. Hypertension associated with diabetes  (HCC)  - pioglitazone (ACTOS) 15 MG tablet; Take 1 tablet (15 mg total) by mouth daily.  Dispense: 90 tablet; Refill: 1

## 2023-07-07 NOTE — Addendum Note (Signed)
 Addended by: Lott Rouleau A on: 07/07/2023 09:47 AM   Modules accepted: Orders

## 2023-07-07 NOTE — Progress Notes (Signed)
 Remote pacemaker transmission.

## 2023-07-20 ENCOUNTER — Ambulatory Visit: Admitting: Podiatry

## 2023-07-20 ENCOUNTER — Encounter: Payer: Self-pay | Admitting: Podiatry

## 2023-07-20 ENCOUNTER — Ambulatory Visit: Payer: Medicare HMO | Admitting: Podiatry

## 2023-07-20 DIAGNOSIS — B351 Tinea unguium: Secondary | ICD-10-CM | POA: Diagnosis not present

## 2023-07-20 DIAGNOSIS — E1142 Type 2 diabetes mellitus with diabetic polyneuropathy: Secondary | ICD-10-CM

## 2023-07-20 DIAGNOSIS — L84 Corns and callosities: Secondary | ICD-10-CM | POA: Diagnosis not present

## 2023-07-20 DIAGNOSIS — M79676 Pain in unspecified toe(s): Secondary | ICD-10-CM

## 2023-07-25 NOTE — Progress Notes (Signed)
  Subjective:  Patient ID: Jordan [name], male    DOB: February 02, 1941,  MRN: [id number]  Jordan [name] presents to clinic today for at risk foot care with history of diabetic neuropathy and corn(s) right foot and painful mycotic toenails that are difficult to trim. Painful toenails interfere with ambulation. Aggravating factors include wearing enclosed shoe gear. Pain is relieved with periodic professional debridement. Painful corns are aggravated when weightbearing when wearing enclosed shoe gear. Pain is relieved with periodic professional debridement.  Chief Complaint  Patient presents with   Diabetes    "She usually trims my toenails.  I have a corn in between my toes. (Interdigital 1st/2nd rt)"  Dr. Ava Lei - 06/16/2023; A1c - 7.6   New problem(s): None.   PCP is Sowles, Krichna, MD.  Allergies  Allergen Reactions   Ace Inhibitors Other (See Comments)    angioedema    Review of Systems: Negative except as noted in the HPI.  Objective: No changes noted in today's physical examination. There were no vitals filed for this visit. Jordan [name] is a pleasant 83 y.o. male morbidly obese in NAD. AAO x 3.  Vascular Examination: CFT <4 seconds b/l. DP pulses diminished b/l. PT pulses diminished b/l. Digital hair absent. Skin temperature gradient warm to cool b/l. No ischemia or gangrene. No cyanosis or clubbing noted b/l.    Neurological Examination: Sensation grossly intact b/l with 10 gram monofilament. Vibratory sensation intact b/l.   Dermatological Examination: Pedal skin thin, shiny and atrophic b/l. No open wounds. No interdigital macerations.   Toenails 1-5 b/l thick, discolored, elongated with subungual debris and pain on dorsal palpation.   Hyperkeratotic lesion(s) R 2nd toe.  No erythema, no edema, no drainage, no fluctuance.  Musculoskeletal Examination: Muscle strength 5/5 to all lower extremity muscle groups bilaterally. Hammertoe(s) 2-5  bilaterally.  Radiographs: None  Last A1c:      Latest Ref Rng & Units 06/16/2023    1:46 PM 02/10/2023    2:44 PM 09/24/2022    1:36 PM  Hemoglobin A1C  Hemoglobin-A1c 4.0 - 5.6 % 7.6  7.6  7.2    Assessment/Plan: 1. 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 Corns   3. Diabetic polyneuropathy associated with type 2 diabetes mellitus [facility])   Consent given for treatment. Patient examined. All patient's and/or POA's questions/concerns addressed on today's visit. Mycotic toenails 1-5 debrided in length and girth without incident. Corn(s) R 2nd toe pared with sharp debridement without incident. Continue foot and shoe inspections daily. Monitor blood glucose per PCP/Endocrinologist's recommendations.Continue soft, supportive shoe gear daily. Report any pedal injuries to medical professional. Call office if there are any quesitons/concerns. -Patient/POA to call should there be question/concern in the interim.   Return in about 3 months (around 10/20/2023).  Luella Sager, DPM      [facility] LOCATION: [address] [place] [address]                   Office [phone]   [facility] LOCATION: [address] [place], [place] [address] Office [phone]

## 2023-08-12 ENCOUNTER — Other Ambulatory Visit: Payer: Self-pay | Admitting: Cardiovascular Disease

## 2023-08-19 ENCOUNTER — Ambulatory Visit (INDEPENDENT_AMBULATORY_CARE_PROVIDER_SITE_OTHER): Payer: Medicare HMO

## 2023-08-19 DIAGNOSIS — I440 Atrioventricular block, first degree: Secondary | ICD-10-CM

## 2023-08-26 LAB — CUP PACEART REMOTE DEVICE CHECK
Battery Remaining Longevity: 156 mo
Battery Voltage: 3.04 V
Brady Statistic AP VP Percent: 0.01 %
Brady Statistic AP VS Percent: 18.3 %
Brady Statistic AS VP Percent: 0.03 %
Brady Statistic AS VS Percent: 81.66 %
Brady Statistic RA Percent Paced: 18.38 %
Brady Statistic RV Percent Paced: 0.04 %
Date Time Interrogation Session: 20250610181719
Implantable Lead Connection Status: 753985
Implantable Lead Connection Status: 753985
Implantable Lead Implant Date: 20230307
Implantable Lead Implant Date: 20230307
Implantable Lead Location: 753859
Implantable Lead Location: 753860
Implantable Lead Model: 3830
Implantable Lead Model: 5076
Implantable Pulse Generator Implant Date: 20230307
Lead Channel Impedance Value: 342 Ohm
Lead Channel Impedance Value: 399 Ohm
Lead Channel Impedance Value: 418 Ohm
Lead Channel Impedance Value: 570 Ohm
Lead Channel Pacing Threshold Amplitude: 0.5 V
Lead Channel Pacing Threshold Amplitude: 1 V
Lead Channel Pacing Threshold Pulse Width: 0.4 ms
Lead Channel Pacing Threshold Pulse Width: 0.4 ms
Lead Channel Sensing Intrinsic Amplitude: 17.875 mV
Lead Channel Sensing Intrinsic Amplitude: 17.875 mV
Lead Channel Sensing Intrinsic Amplitude: 5.75 mV
Lead Channel Sensing Intrinsic Amplitude: 5.75 mV
Lead Channel Setting Pacing Amplitude: 1.5 V
Lead Channel Setting Pacing Amplitude: 2 V
Lead Channel Setting Pacing Pulse Width: 0.4 ms
Lead Channel Setting Sensing Sensitivity: 1.2 mV
Zone Setting Status: 755011
Zone Setting Status: 755011

## 2023-08-28 ENCOUNTER — Ambulatory Visit: Payer: Self-pay | Admitting: Cardiology

## 2023-09-02 LAB — HM DIABETES EYE EXAM

## 2023-09-07 ENCOUNTER — Other Ambulatory Visit: Payer: Self-pay | Admitting: Cardiovascular Disease

## 2023-10-12 NOTE — Progress Notes (Signed)
 Remote pacemaker transmission.

## 2023-10-12 NOTE — Addendum Note (Signed)
 Addended by: VICCI SELLER A on: 10/12/2023 12:52 PM   Modules accepted: Orders

## 2023-10-15 ENCOUNTER — Ambulatory Visit: Admitting: Family Medicine

## 2023-10-15 ENCOUNTER — Encounter: Payer: Self-pay | Admitting: Family Medicine

## 2023-10-15 VITALS — BP 128/74 | HR 95 | Resp 16 | Ht 69.0 in | Wt 314.6 lb

## 2023-10-15 DIAGNOSIS — I739 Peripheral vascular disease, unspecified: Secondary | ICD-10-CM | POA: Diagnosis not present

## 2023-10-15 DIAGNOSIS — N401 Enlarged prostate with lower urinary tract symptoms: Secondary | ICD-10-CM

## 2023-10-15 DIAGNOSIS — M170 Bilateral primary osteoarthritis of knee: Secondary | ICD-10-CM

## 2023-10-15 DIAGNOSIS — E1129 Type 2 diabetes mellitus with other diabetic kidney complication: Secondary | ICD-10-CM

## 2023-10-15 DIAGNOSIS — I700 Atherosclerosis of aorta: Secondary | ICD-10-CM

## 2023-10-15 DIAGNOSIS — I77810 Thoracic aortic ectasia: Secondary | ICD-10-CM

## 2023-10-15 DIAGNOSIS — Z952 Presence of prosthetic heart valve: Secondary | ICD-10-CM

## 2023-10-15 DIAGNOSIS — M109 Gout, unspecified: Secondary | ICD-10-CM

## 2023-10-15 DIAGNOSIS — E66813 Obesity, class 3: Secondary | ICD-10-CM

## 2023-10-15 DIAGNOSIS — J410 Simple chronic bronchitis: Secondary | ICD-10-CM

## 2023-10-15 DIAGNOSIS — R809 Proteinuria, unspecified: Secondary | ICD-10-CM

## 2023-10-15 DIAGNOSIS — E1159 Type 2 diabetes mellitus with other circulatory complications: Secondary | ICD-10-CM

## 2023-10-15 LAB — POCT GLYCOSYLATED HEMOGLOBIN (HGB A1C): Hemoglobin A1C: 7.3 % — AB (ref 4.0–5.6)

## 2023-10-15 MED ORDER — PIOGLITAZONE HCL 15 MG PO TABS: 15.0000 mg | ORAL_TABLET | Freq: Every day | ORAL | 1 refills | Status: DC

## 2023-10-15 MED ORDER — AMLODIPINE BESYLATE 5 MG PO TABS: 5.0000 mg | ORAL_TABLET | Freq: Every day | ORAL | 1 refills | Status: DC

## 2023-10-15 MED ORDER — GLIPIZIDE ER 5 MG PO TB24: 5.0000 mg | ORAL_TABLET | Freq: Every day | ORAL | 1 refills | Status: DC

## 2023-10-15 MED ORDER — METFORMIN HCL ER 750 MG PO TB24: 1500.0000 mg | ORAL_TABLET | Freq: Every day | ORAL | 1 refills | Status: DC

## 2023-10-15 MED ORDER — METOPROLOL SUCCINATE ER 25 MG PO TB24: 25.0000 mg | ORAL_TABLET | Freq: Every day | ORAL | 3 refills | Status: AC

## 2023-10-15 NOTE — Progress Notes (Signed)
 Name: Jordan [name]   MRN: [id number]    DOB: 02/18/1941   Date:10/15/2023       Progress Note  Subjective  Chief Complaint  Chief Complaint  Patient presents with   Medical Management of Chronic Issues   HPI   DM  type 2 with microalbuminuria , obesity, dyslipidemia and HTN and  ED: he is back on Glipizide  5mg  ER , Metformin  1500 mg daily and pioglitazone  15 mg ,  A1C today improved down from 7.6 % to 7.3%   He cannot take ACE because of history of angioedema, bp is controlled with Norvasc  .  He denies polyphagia, polydipsia but has polyuria - also has BPH and has nocturia, denies recent episodes of hypoglycemia.    Chronic bronchitis: he used to smoke but quit many years ago, he still has a cough that is productive at times, usually in the  mornings, sputum is yellow and stable. No wheezing. SOB with activity is stable    HTN:  he is compliant with mediations,  no chest pain,, he has some SOB with activity .He has lower extremity edema but mild , he was having some dizziness in 2024  and we decreased norvasc  from 10 mg to 5 mg ( gradually )  also takes metoprolol  25 mg, tolerated medications well.   Morbid obese : his weight is trending up, he is still walking for 30-45 minutes 5 days a week  with his walking stick. He states he has not been as compliant with his diet. He has been spending more time with his brother in [place] and eats the food he cooks. He has his house in [place] but only here to pay bill and make sure the house is okay   History of Aortic valve stenosis/Asending aorta dilation: under the care of cardiologist - Dr. Perla,  no syncope or chest pain, had echo normal EF. 55-60  %. He is now also seeing Dr. Lucas cardiovascular surgeon in [place] , he had TAVR 05/14/2021  also had a medtronic dual chamber pacemaker placed for symptomatic bradycardia due to 2:1 AV block on March 2023 . He had echo done 05/2022  and ascending aorta dilation is stable. He needs to  re-schedule visit with cardiologist    Atherosclerosis of aorta/Hyperlipidemia : he is on  Atorvastatin   therapy, taking aspirin  81 mg daily  last LDL was 77 , explained importance of compliance to get LDL below 70, we will recheck labs next visit    History of iron deficiency anemia:  secondary to rectal bleeding, he has a personal history of hemorrhoids and diverticulosis, he was released from Dr. Jinny .He is still taking iron supplementation and denies pica or sob , last HCT still low but normal iron storage , we will recheck labs next visit    Gout: taking allopurinol  and not on diuretics  Last uric acid was normal . No problems in a long time   Claudication lower extremity :  he states no longer walking one hour because he has to take multiple breaks. He has been walking again for 30 minutes no longer having to take a break every time he walks  He was seen by vascular surgeon and felt likely secondary from lumbar spine problems. Discussed risk and benefits of therapy for lumbar spine stenosis and since symptoms only when walking he will continue physical activity for now. Symptoms stable    OA on both knees he states symptoms have been stable, he uses a cane  or walking stick    BPH with LUTS, enhancing right renal mass/ED: seen recently by Urologist , taking cialis  20 mg now. 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   PAD (peripheral artery disease) (HCC) 01/17/2020   Diverticulosis of colon with hemorrhage 05/26/2019   Atherosclerosis of aorta (HCC) 04/26/2019   Rectal bleed 04/22/2019   Chronic bronchitis (HCC) 07/30/2018   Bilateral hydrocele 11/27/2015   Ventral hernia without obstruction or gangrene 11/27/2015   Controlled type 2 diabetes mellitus with microalbuminuria (HCC) 10/26/2015   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   Hyperlipidemia 11/07/2014   Essential hypertension 11/07/2014   Obesity, Class III, BMI 40-49.9 (morbid obesity) (HCC) 11/07/2014   Controlled gout 11/07/2014   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   Constipation 10/26/2012   Incisional hernia, without obstruction or gangrene 10/26/2012   Left inguinal hernia     Past Surgical History:  Procedure Laterality Date   cataract surgery     COLONOSCOPY  03/17/2010   COLONOSCOPY N/A 04/22/2019   Procedure: COLONOSCOPY;  Surgeon: Wilhelmenia Aloha Raddle., MD;  Location: [facility];  Service: Gastroenterology;  Laterality: N/A;   EYE SURGERY  03/17/2009   cataract   HERNIA REPAIR     INTRAOPERATIVE TRANSTHORACIC ECHOCARDIOGRAM N/A 05/14/2021   Procedure: INTRAOPERATIVE TRANSTHORACIC ECHOCARDIOGRAM;  Surgeon: Wonda Sharper, MD;  Location: [facility] INVASIVE CV LAB;  Service: Open Heart Surgery;  Laterality: N/A;   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   Procedure: PACEMAKER IMPLANT;  Surgeon: Waddell Danelle ORN, MD;  Location: [facility] INVASIVE CV LAB;  Service: Cardiovascular;  Laterality: N/A;   RIGHT HEART CATH AND CORONARY ANGIOGRAPHY Bilateral 02/15/2021   Procedure: RIGHT HEART CATH AND CORONARY ANGIOGRAPHY;  Surgeon: Mady Bruckner, MD;  Location: [facility] INVASIVE CV LAB;  Service: Cardiovascular;  Laterality: Bilateral;   TRANSCATHETER AORTIC VALVE REPLACEMENT, TRANSFEMORAL N/A 05/14/2021   Procedure:  Transcatheter Aortic Valve Replacement, Transfemoral;  Surgeon: Wonda Sharper, MD;  Location: [facility] INVASIVE CV LAB;  Service: Open Heart Surgery;  Laterality: N/A;   UMBILICAL HERNIA REPAIR  03/17/2009    Family History  Problem Relation Age of Onset   Heart disease Father    Hypertension Father    Diabetes Sister    Diabetes Brother    Prostate cancer Brother    Diabetes Sister    Kidney disease Neg Hx    Kidney cancer Neg Hx    Bladder Cancer Neg Hx    Colon cancer Neg Hx    Esophageal cancer Neg Hx    Inflammatory bowel disease Neg Hx    Liver disease Neg Hx    Pancreatic cancer Neg Hx    Rectal cancer Neg Hx    Stomach cancer Neg Hx     Social History   Tobacco Use   Smoking status: Former    Current packs/day: 0.00    Average  packs/day: 1 pack/day for 10.7 years (10.7 ttl pk-yrs)    Types: Cigarettes    Start date: 03/17/1965    Quit date: 11/27/1975    Years since quitting: 47.9   Smokeless tobacco: Never   Tobacco comments:    quit 40 years  Substance Use Topics   Alcohol use: Not Currently    Alcohol/week: 1.0 standard drink of alcohol    Types: 1 Standard drinks or equivalent per week    Comment: socially - 1 x year     Current Outpatient Medications:    acetaminophen  (TYLENOL ) 325 MG tablet, Take 2 tablets (650 mg total) by mouth every 4 (four) hours as needed for headache or mild pain., Disp: , Rfl:    Alcohol Swabs (B-D SINGLE USE SWABS REGULAR) PADS, 1 each by Does not apply route as needed., Disp: 100 each, Rfl: 3   allopurinol  (ZYLOPRIM ) 300 MG tablet, Take 1 tablet (300 mg total) by mouth daily., Disp: 90 tablet, Rfl: 3   amLODipine  (NORVASC ) 5 MG tablet, Take 1 tablet (5 mg total) by mouth daily., Disp: 90 tablet, Rfl: 1   aspirin  EC 81 MG tablet, Take 1 tablet (81 mg total) by mouth daily. Swallow whole., Disp: , Rfl:    atorvastatin  (LIPITOR) 80 MG tablet, Take 1 tablet (80 mg total) by mouth at bedtime., Disp: 90 tablet, Rfl: 3   Blood Glucose  Calibration (TRUE METRIX LEVEL 1) Low SOLN, 1 Bottle by In Vitro route once for 1 dose., Disp: 1 each, Rfl: 0   Blood Glucose Monitoring Suppl (TRUE METRIX AIR GLUCOSE METER) DEVI, 1 each by Does not apply route once for 1 dose., Disp: 1 each, Rfl: 0   Cholecalciferol (VITAMIN D3 PO), Take 1 tablet by mouth every Monday, Wednesday, and Friday., Disp: , Rfl:    ferrous gluconate  (FERGON) 240 (27 FE) MG tablet, Take 240 mg by mouth 3 (three) times daily with meals., Disp: , Rfl:    furosemide  (LASIX ) 20 MG tablet, TAKE 1 TABLET EVERY DAY, Disp: 90 tablet, Rfl: 0   glipiZIDE  (GLUCOTROL  XL) 5 MG 24 hr tablet, Take 1 tablet (5 mg total) by mouth daily with breakfast., Disp: 90 tablet, Rfl: 1   latanoprost  (XALATAN ) 0.005 % ophthalmic solution, Place 1 drop into the left eye at bedtime. , Disp: , Rfl:    metFORMIN  (GLUCOPHAGE -XR) 750 MG 24 hr tablet, Take 2 tablets (1,500 mg total) by mouth daily with breakfast., Disp: 180 tablet, Rfl: 1   metoprolol  succinate (TOPROL  XL) 25 MG 24 hr tablet, Take 1 tablet (25 mg total) by mouth daily., Disp: 90 tablet, Rfl: 3   Omega-3 Fatty Acids (FISH OIL PO), Take 1 capsule by mouth 2 (two) times daily. , Disp: , Rfl:    pioglitazone  (ACTOS ) 15 MG tablet, Take 1 tablet (15 mg total) by mouth daily., Disp: 90 tablet, Rfl: 1   polyethylene glycol (MIRALAX  / GLYCOLAX ) packet, Take 17 g by mouth at bedtime. , Disp: , Rfl:    tadalafil  (CIALIS ) 20 MG tablet, Take 1 tablet (20 mg total) by mouth daily as needed for erectile dysfunction., Disp: 30 tablet, Rfl: 3   TRUE METRIX BLOOD GLUCOSE TEST test strip, CHECK BLOOD SUGAR AS DIRECTED, Disp: 200 strip, Rfl: 3   TRUEplus Lancets 33G MISC, CHECK BLOOD GLUCOSE TWO TIMES DAILY, Disp: 200 each, Rfl: 3  Allergies  Allergen Reactions   Ace Inhibitors Other (See Comments)    angioedema    I personally reviewed active problem list,  medication list, allergies with the patient/caregiver today.   ROS  Ten systems reviewed and  is negative except as mentioned in HPI    Objective Physical Exam Constitutional: Patient appears well-developed and well-nourished. Obese  No distress.  HEENT: head atraumatic, normocephalic, pupils equal and reactive to light, neck supple Cardiovascular: Normal rate, regular rhythm and normal heart sounds.  No murmur heard. No BLE edema. Pulmonary/Chest: Effort normal and breath sounds normal. No respiratory distress. Abdominal: Soft.  There is no tenderness. Muscular skeletal: using a cane due to knee OA  Psychiatric: Patient has a normal mood and affect. behavior is normal. Judgment and thought content normal.   Vitals:   10/15/23 1436  BP: 128/74  Pulse: 95  Resp: 16  SpO2: 97%  Weight: (!) 314 lb 9.6 oz (142.7 kg)  Height: 5' 9 (1.753 m)    Body mass index is 46.46 kg/m.  Recent Results (from the past 2160 hours)  CUP PACEART REMOTE DEVICE CHECK     Status: None   Collection Time: 08/25/23  6:17 PM  Result Value Ref Range   Date Time Interrogation Session [id number]    Pulse Generator Manufacturer MERM    Pulse Gen Model [id number] Azure XT DR MRI    Pulse Gen Serial Number [id number]    Clinic Name [facility]    Implantable Pulse Generator Type Implantable Pulse Generator    Implantable Pulse Generator Implant Date 79769692    Implantable Lead Manufacturer MERM    Implantable Lead Model 3830 SelectSecure MRI SureScan    Implantable Lead Serial Number [id number]    Implantable Lead Implant Date 79769692    Implantable Lead Location Detail 1 UNKNOWN    Implantable Lead Special Function LBBB    Implantable Lead Location [id number]    Implantable Lead Connection Status [id number]    Implantable Lead Manufacturer MERM    Implantable Lead Model 5076 CapSureFix Novus MRI SureScan    Implantable Lead Serial Number [id number]    Implantable Lead Implant Date 79769692    Implantable Lead Location Detail 1 APPENDAGE    Implantable Lead Location [id number]    Implantable Lead  Connection Status [id number]    Lead Channel Setting Sensing Sensitivity 1.2 mV   Lead Channel Setting Pacing Amplitude 1.5 V   Lead Channel Setting Pacing Pulse Width 0.4 ms   Lead Channel Setting Pacing Amplitude 2 V   Zone Setting Status [id number]    Zone Setting Status [phone]    Lead Channel Impedance Value 399 ohm   Lead Channel Impedance Value 342 ohm   Lead Channel Sensing Intrinsic Amplitude 5.75 mV   Lead Channel Sensing Intrinsic Amplitude 5.75 mV   Lead Channel Pacing Threshold Amplitude 0.5 V   Lead Channel Pacing Threshold Pulse Width 0.4 ms   Lead Channel Impedance Value 570 ohm   Lead Channel Impedance Value 418 ohm   Lead Channel Sensing Intrinsic Amplitude 17.875 mV   Lead Channel Sensing Intrinsic Amplitude 17.875 mV   Lead Channel Pacing Threshold Amplitude 1 V   Lead Channel Pacing Threshold Pulse Width 0.4 ms   Battery Status OK    Battery Remaining Longevity 156 mo   Battery Voltage 3.04 V   Brady Statistic RA Percent Paced 18.38 %   Brady Statistic RV Percent Paced 0.04 %   Brady Statistic AP VP Percent 0.01 %   Brady Statistic AS VP Percent 0.03 %   Brady Statistic AP VS Percent 18.3 %   Brady Statistic AS VS Percent 81.66 %  HM DIABETES EYE EXAM     Status: None   Collection Time: 09/02/23  4:37 PM  Result Value Ref Range   HM Diabetic Eye Exam No Retinopathy No Retinopathy    Comment: Abstracted by HIM  POCT glycosylated hemoglobin (Hb A1C)     Status: Abnormal   Collection Time: 10/15/23  2:39 PM  Result Value Ref Range   Hemoglobin A1C 7.3 (A) 4.0 - 5.6 %   HbA1c POC (<> result, manual entry)     HbA1c, POC (prediabetic range)     HbA1c, POC (controlled diabetic range)      Diabetic Foot Exam:     PHQ2/9:    10/15/2023    2:26 PM 06/16/2023    1:36 PM 03/26/2023    2:36 PM 02/10/2023    2:41 PM 09/24/2022    1:36 PM  Depression screen PHQ 2/9  Decreased Interest 0 0 0 0 0  Down, Depressed, Hopeless 0 0 0 0 0  PHQ - 2 Score 0 0 0 0 0  Altered  sleeping  0 0 0 0  Tired, decreased energy  0 0 0 0  Change in appetite  0 0 0 0  Feeling bad or failure about yourself   0 0 0 0  Trouble concentrating  0 0 0 0  Moving slowly or fidgety/restless  0 0 0 0  Suicidal thoughts  0 0 0 0  PHQ-9 Score  0 0 0 0  Difficult doing work/chores  Not difficult at all       phq 9 is negative  Fall Risk:    10/15/2023    2:26 PM 06/16/2023    1:36 PM 03/26/2023    2:39 PM 02/10/2023    2:41 PM 09/24/2022    1:35 PM  Fall Risk   Falls in the past year? 0 0 0 0 0  Number falls in past yr: 0 0 0 0   Injury with Fall? 0 0 0 0   Risk for fall due to : No Fall Risks No Fall Risks No Fall Risks Impaired balance/gait Impaired balance/gait  Follow up Falls evaluation completed Falls prevention discussed;Education provided;Falls evaluation completed Falls prevention discussed;Falls evaluation completed Falls prevention discussed Falls prevention discussed;Education provided;Falls evaluation completed     Assessment & Plan  1. Controlled type 2 diabetes mellitus with microalbuminuria, without long-term current use of insulin  (HCC) (Primary)  - POCT glycosylated hemoglobin (Hb A1C) - metFORMIN  (GLUCOPHAGE -XR) 750 MG 24 hr tablet; Take 2 tablets (1,500 mg total) by mouth daily with breakfast.  Dispense: 180 tablet; Refill: 1 - glipiZIDE  (GLUCOTROL  XL) 5 MG 24 hr tablet; Take 1 tablet (5 mg total) by mouth daily with breakfast.  Dispense: 90 tablet; Refill: 1 - pioglitazone  (ACTOS ) 15 MG tablet; Take 1 tablet (15 mg total) by mouth daily.  Dispense: 90 tablet; Refill: 1  2. 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HCC)  Monitored by cardiologist   3. Claudication of both lower extremities (HCC)  Stable , he refuses referral to vascular surgeon   4. Atherosclerosis of aorta (HCC)  On statin therapy   5. Simple chronic bronchitis (HCC)  Still has SOB with activity and a productive cough - he does not want an inhaler  6. Hypertension associated with diabetes  (HCC)  - metoprolol  succinate (TOPROL  XL) 25 MG 24 hr tablet; Take 1 tablet (25 mg total) by mouth daily.  Dispense: 90 tablet; Refill: 3 - amLODipine  (NORVASC ) 5 MG tablet; Take 1 tablet (5 mg total)  by mouth daily.  Dispense: 90 tablet; Refill: 1  7. Primary osteoarthritis of both knees  Stable   8. S/P TAVR (transcatheter aortic valve replacement)  stable  9. Benign prostatic hyperplasia with nocturia  Under the care of Urologist   10. Controlled gout  Continue Allopurinol    11. Obesity, Class III, BMI 40-49.9 (morbid obesity)   Discussed portion control, cut down on carbohydrates

## 2023-10-16 ENCOUNTER — Ambulatory Visit: Admitting: Family Medicine

## 2023-10-22 ENCOUNTER — Ambulatory Visit (INDEPENDENT_AMBULATORY_CARE_PROVIDER_SITE_OTHER): Admitting: Podiatry

## 2023-10-22 DIAGNOSIS — Z91199 Patient's noncompliance with other medical treatment and regimen due to unspecified reason: Secondary | ICD-10-CM

## 2023-10-26 NOTE — Progress Notes (Signed)
 1. No-show for appointment

## 2023-11-18 ENCOUNTER — Ambulatory Visit (INDEPENDENT_AMBULATORY_CARE_PROVIDER_SITE_OTHER): Payer: Medicare HMO

## 2023-11-18 DIAGNOSIS — I442 Atrioventricular block, complete: Secondary | ICD-10-CM

## 2023-11-19 LAB — CUP PACEART REMOTE DEVICE CHECK
Battery Remaining Longevity: 153 mo
Battery Voltage: 3.04 V
Brady Statistic AP VP Percent: 0.01 %
Brady Statistic AP VS Percent: 12.03 %
Brady Statistic AS VP Percent: 0.03 %
Brady Statistic AS VS Percent: 87.93 %
Brady Statistic RA Percent Paced: 12.13 %
Brady Statistic RV Percent Paced: 0.04 %
Date Time Interrogation Session: 20250902202904
Implantable Lead Connection Status: 753985
Implantable Lead Connection Status: 753985
Implantable Lead Implant Date: 20230307
Implantable Lead Implant Date: 20230307
Implantable Lead Location: 753859
Implantable Lead Location: 753860
Implantable Lead Model: 3830
Implantable Lead Model: 5076
Implantable Pulse Generator Implant Date: 20230307
Lead Channel Impedance Value: 323 Ohm
Lead Channel Impedance Value: 380 Ohm
Lead Channel Impedance Value: 380 Ohm
Lead Channel Impedance Value: 570 Ohm
Lead Channel Pacing Threshold Amplitude: 0.5 V
Lead Channel Pacing Threshold Amplitude: 1 V
Lead Channel Pacing Threshold Pulse Width: 0.4 ms
Lead Channel Pacing Threshold Pulse Width: 0.4 ms
Lead Channel Sensing Intrinsic Amplitude: 16.625 mV
Lead Channel Sensing Intrinsic Amplitude: 16.625 mV
Lead Channel Sensing Intrinsic Amplitude: 4.5 mV
Lead Channel Sensing Intrinsic Amplitude: 4.5 mV
Lead Channel Setting Pacing Amplitude: 1.5 V
Lead Channel Setting Pacing Amplitude: 2 V
Lead Channel Setting Pacing Pulse Width: 0.4 ms
Lead Channel Setting Sensing Sensitivity: 1.2 mV
Zone Setting Status: 755011
Zone Setting Status: 755011

## 2023-11-20 NOTE — Progress Notes (Signed)
 Remote PPM Transmission

## 2023-11-21 ENCOUNTER — Ambulatory Visit: Payer: Self-pay | Admitting: Cardiology

## 2023-12-08 ENCOUNTER — Encounter: Payer: Self-pay | Admitting: Cardiovascular Disease

## 2023-12-08 ENCOUNTER — Ambulatory Visit: Attending: Cardiovascular Disease | Admitting: Cardiovascular Disease

## 2023-12-08 VITALS — BP 122/80 | HR 81 | Ht 69.0 in | Wt 314.0 lb

## 2023-12-08 DIAGNOSIS — Z950 Presence of cardiac pacemaker: Secondary | ICD-10-CM | POA: Diagnosis not present

## 2023-12-08 DIAGNOSIS — I442 Atrioventricular block, complete: Secondary | ICD-10-CM

## 2023-12-08 DIAGNOSIS — I472 Ventricular tachycardia, unspecified: Secondary | ICD-10-CM

## 2023-12-08 DIAGNOSIS — I471 Supraventricular tachycardia, unspecified: Secondary | ICD-10-CM | POA: Diagnosis not present

## 2023-12-08 MED ORDER — FUROSEMIDE 20 MG PO TABS: 20.0000 mg | ORAL_TABLET | Freq: Every day | ORAL | 3 refills | Status: DC

## 2023-12-08 NOTE — Patient Instructions (Signed)

## 2023-12-08 NOTE — Progress Notes (Signed)
 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  Renal mass followed by urology Who presents for f/u of his aortic valve stenosis, s/p TAVR , pacer for symptomatic bradycardia  Last seen in clinic by myself 5/24  In follow-up today reports doing well Presents in a wheelchair Ambulates with a cane for gait instability Denies any recent falls Denies shortness of breath or chest pain on exertion Weight elevated but stable 314 pounds Denies lower extremity edema No tachycardia, no palpitations, no near-syncope  Pacer downloads reviewed A sensed V sensed rhythm Rare episodes of tachycardia, short  Echo March 2024 EF 55 to 60%, well-functioning TAVR valve  Lab work reviewed A1c 7.3 Total cholesterol 140 LDL 77 Creatinine 0.89  EKG personally reviewed by myself on todays visit EKG Interpretation Date/Time:  Tuesday December 08 2023 15:46:27 EDT Ventricular Rate:  81 PR Interval:  188 QRS Duration:  116 QT Interval:  386 QTC Calculation: 448 R Axis:   19  Text Interpretation: Normal sinus rhythm Incomplete right bundle branch block When compared with ECG of 21-Nov-2022 13:28, No significant change was found Confirmed by Jordan [name] [phone]) on 12/08/2023 3:56:56 PM   Other past medical history reviewed LHC performed 02/2021 showed 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 Otherwise, there was mild-moderate, non-obstructive coronary artery disease.   s/p TAVR 05/14/21  Post operative echo showed EF 60%, normally functioning TAVR with a mean gradient of  4 mmHg and no PVL. 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 Waddell.   Echo 03/2020, The aortic valve has an indeterminant number of cusps. There is  moderate calcification of the aortic valve. There is severe thickening of  the aortic valve. Aortic valve regurgitation is not visualized. Severe  aortic valve stenosis. Aortic valve mean  gradient measures 47.0 mmHg.    PMH:   has a past medical history of Aortic stenosis, Chronic kidney disease, stage II (mild), Decreased libido, Diabetes mellitus without complication (HCC), Glaucoma, Gout (2009), Heart murmur, Hernia (7990,7985), Hyperlipidemia, Hypertension, Inguinal hernia without mention of obstruction or gangrene, unilateral or unspecified, (not specified as recurrent), Lumbago, Obesity, S/P TAVR (transcatheter aortic valve replacement) (05/14/2021), and Unspecified constipation.  PSH:    Past Surgical History:  Procedure Laterality Date   cataract surgery     COLONOSCOPY  03/17/2010   COLONOSCOPY N/A 04/22/2019   Procedure: COLONOSCOPY;  Surgeon: Mansouraty, Aloha Raddle., MD;  Location: [facility];  Service: Gastroenterology;  Laterality: N/A;   EYE SURGERY  03/17/2009   cataract   HERNIA REPAIR     INTRAOPERATIVE TRANSTHORACIC ECHOCARDIOGRAM N/A 05/14/2021   Procedure: INTRAOPERATIVE TRANSTHORACIC ECHOCARDIOGRAM;  Surgeon: Wonda Sharper, MD;  Location: [facility] INVASIVE CV LAB;  Service: Open Heart Surgery;  Laterality: N/A;   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   Procedure: PACEMAKER IMPLANT;  Surgeon: Waddell Danelle ORN, MD;  Location: [facility] INVASIVE CV LAB;  Service: Cardiovascular;  Laterality: N/A;   RIGHT HEART  CATH AND CORONARY ANGIOGRAPHY Bilateral 02/15/2021   Procedure: RIGHT HEART CATH AND CORONARY ANGIOGRAPHY;  Surgeon: Mady Bruckner, MD;  Location: [facility]  INVASIVE CV LAB;  Service: Cardiovascular;  Laterality: Bilateral;   TRANSCATHETER AORTIC VALVE REPLACEMENT, TRANSFEMORAL N/A 05/14/2021   Procedure: Transcatheter Aortic Valve Replacement, Transfemoral;  Surgeon: Wonda Sharper, MD;  Location: [facility] INVASIVE CV LAB;  Service: Open Heart Surgery;  Laterality: N/A;   UMBILICAL HERNIA REPAIR  03/17/2009    Current Outpatient Medications  Medication Sig Dispense Refill   acetaminophen  (TYLENOL ) 325 MG tablet Take 2 tablets (650 mg total) by mouth every 4 (four) hours as needed for headache or mild pain.     Alcohol Swabs (B-D SINGLE USE SWABS REGULAR) PADS 1 each by Does not apply route as needed. 100 each 3   allopurinol  (ZYLOPRIM ) 300 MG tablet Take 1 tablet (300 mg total) by mouth daily. 90 tablet 3   amLODipine  (NORVASC ) 5 MG tablet Take 1 tablet (5 mg total) by mouth daily. 90 tablet 1   aspirin  EC 81 MG tablet Take 1 tablet (81 mg total) by mouth daily. Swallow whole.     atorvastatin  (LIPITOR) 80 MG tablet Take 1 tablet (80 mg total) by mouth at bedtime. 90 tablet 3   Blood Glucose Calibration (TRUE METRIX LEVEL 1) Low SOLN 1 Bottle by In Vitro route once for 1 dose. 1 each 0   Blood Glucose Monitoring Suppl (TRUE METRIX AIR GLUCOSE METER) DEVI 1 each by Does not apply route once for 1 dose. 1 each 0   Cholecalciferol (VITAMIN D3 PO) Take 1 tablet by mouth every Monday, Wednesday, and Friday.     ferrous gluconate  (FERGON) 240 (27 FE) MG tablet Take 240 mg by mouth 3 (three) times daily with meals.     furosemide  (LASIX ) 20 MG tablet TAKE 1 TABLET EVERY DAY 90 tablet 0   glipiZIDE  (GLUCOTROL  XL) 5 MG 24 hr tablet Take 1 tablet (5 mg total) by mouth daily with breakfast. 90 tablet 1   latanoprost  (XALATAN ) 0.005 % ophthalmic solution Place 1 drop into the left eye at bedtime.      metFORMIN  (GLUCOPHAGE -XR) 750 MG 24 hr tablet Take 2 tablets (1,500 mg total) by mouth daily with breakfast. 180 tablet 1   metoprolol  succinate (TOPROL  XL) 25 MG 24  hr tablet Take 1 tablet (25 mg total) by mouth daily. 90 tablet 3   Omega-3 Fatty Acids (FISH OIL PO) Take 1 capsule by mouth 2 (two) times daily.      pioglitazone  (ACTOS ) 15 MG tablet Take 1 tablet (15 mg total) by mouth daily. 90 tablet 1   polyethylene glycol (MIRALAX  / GLYCOLAX ) packet Take 17 g by mouth at bedtime.      tadalafil  (CIALIS ) 20 MG tablet Take 1 tablet (20 mg total) by mouth daily as needed for erectile dysfunction. 30 tablet 3   TRUE METRIX BLOOD GLUCOSE TEST test strip CHECK BLOOD SUGAR AS DIRECTED 200 strip 3   TRUEplus Lancets 33G MISC CHECK BLOOD GLUCOSE TWO TIMES DAILY 200 each 3   No current facility-administered medications for this visit.    Allergies:   Ace inhibitors   Social History:  The patient  reports that he quit smoking about 48 years ago. His smoking use included cigarettes. He started smoking about 58 years ago. He has a 10.7 pack-year smoking history. He has never used smokeless tobacco. He reports that he does not currently use alcohol after a past usage of about 1.0  standard drink of alcohol per week. He reports that he does not use drugs.   Family History:   family history includes Diabetes in his brother, sister, and sister; Heart disease in his father; Hypertension in his father; Prostate cancer in his brother.    Review of Systems: Review of Systems  Constitutional: Negative.   HENT: Negative.    Respiratory: Negative.    Cardiovascular: Negative.   Gastrointestinal: Negative.   Musculoskeletal: Negative.        Leg weakness  Neurological: Negative.   Psychiatric/Behavioral: Negative.    All other systems reviewed and are negative.   PHYSICAL EXAM: VS:  BP 122/80 (BP Location: Left Arm, Patient Position: Sitting, Cuff Size: Normal)   Pulse 81   Ht 5' 9 (1.753 m)   Wt (!) 314 lb (142.4 kg)   SpO2 96%   BMI 46.37 kg/m  , BMI Body mass index is 46.37 kg/m. Constitutional:  oriented to person, place, and time. No distress.  HENT:   Head: Normocephalic and atraumatic.  Eyes:  no discharge. No scleral icterus.  Neck: Normal range of motion. Neck supple. No JVD present.  Cardiovascular: Normal rate, regular rhythm, normal heart sounds and intact distal pulses. Exam reveals no gallop and no friction rub. No edema No murmur heard. Pulmonary/Chest: Effort normal and breath sounds normal. No stridor. No respiratory distress.  no wheezes.  no rales.  no tenderness.  Abdominal: Soft.  no distension.  no tenderness.  Musculoskeletal: Normal range of motion.  no  tenderness or deformity.  Neurological:  normal muscle tone. Coordination normal. No atrophy Skin: Skin is warm and dry. No rash noted. not diaphoretic.  Psychiatric:  normal mood and affect. behavior is normal. Thought content normal.     Recent Labs: 02/10/2023: ALT 11; BUN 17; Creat 0.89; Hemoglobin 12.9; Platelets 161; Potassium 4.6; Sodium 138    Lipid Panel Lab Results  Component Value Date   CHOL 140 02/10/2023   HDL 49 02/10/2023   LDLCALC 77 02/10/2023   TRIG 61 02/10/2023      Wt Readings from Last 3 Encounters:  12/08/23 (!) 314 lb (142.4 kg)  10/15/23 (!) 314 lb 9.6 oz (142.7 kg)  06/16/23 (!) 313 lb 3.2 oz (142.1 kg)     ASSESSMENT AND PLAN:  Aortic valve stenosis, severe Moderate aortic valve stenosis June 2020 Severe stenosis January 2022 cardiac catheterization, nonobstructive disease TAVR placement February 2023 - Echo 2024 stable TAVR No change in clinical status or exam -Repeat echo 2026  Essential hypertension -  Blood pressure is well controlled on today's visit. No changes made to the medications.  Mixed hyperlipidemia Continue Lipitor 80 daily  goal LDL less than 70 Cholesterol at goal  Controlled type 2 diabetes mellitus with microalbuminuria, without long-term current use of insulin  (HCC) A1c improving down to 7 Sedentary No regular exercise program -Recommend low carbohydrate diet  Obesity, Class III, BMI  40-49.9 (morbid obesity) (HCC) Recommend calorie restriction      Orders Placed This Encounter  Procedures   EKG 12-Lead     Signed, Velinda Lunger, M.D., Ph.D. 12/08/2023  [facility] [place], [place] [phone]

## 2023-12-09 ENCOUNTER — Telehealth: Payer: Self-pay | Admitting: Podiatry

## 2023-12-09 NOTE — Telephone Encounter (Signed)
 error

## 2023-12-21 DIAGNOSIS — D49511 Neoplasm of unspecified behavior of right kidney: Secondary | ICD-10-CM | POA: Diagnosis not present

## 2023-12-23 NOTE — Progress Notes (Signed)
 Jordan [name]                                          MRN: [id number]   12/23/2023   The [facility] Quality Team Specialist reviewed this patient medical record for the purposes of chart review for care gap closure. The following were reviewed: chart review for care gap closure-kidney health evaluation for diabetes:eGFR  and uACR.    [facility] Quality Team

## 2023-12-28 DIAGNOSIS — C641 Malignant neoplasm of right kidney, except renal pelvis: Secondary | ICD-10-CM | POA: Diagnosis not present

## 2023-12-28 DIAGNOSIS — D3502 Benign neoplasm of left adrenal gland: Secondary | ICD-10-CM | POA: Diagnosis not present

## 2023-12-28 DIAGNOSIS — D49511 Neoplasm of unspecified behavior of right kidney: Secondary | ICD-10-CM | POA: Diagnosis not present

## 2023-12-28 DIAGNOSIS — D3501 Benign neoplasm of right adrenal gland: Secondary | ICD-10-CM | POA: Diagnosis not present

## 2023-12-29 ENCOUNTER — Ambulatory Visit (INDEPENDENT_AMBULATORY_CARE_PROVIDER_SITE_OTHER)

## 2023-12-29 DIAGNOSIS — Z23 Encounter for immunization: Secondary | ICD-10-CM | POA: Diagnosis not present

## 2024-01-09 ENCOUNTER — Other Ambulatory Visit: Payer: Self-pay | Admitting: Cardiovascular Disease

## 2024-01-18 ENCOUNTER — Ambulatory Visit: Admitting: Podiatry

## 2024-01-18 DIAGNOSIS — L84 Corns and callosities: Secondary | ICD-10-CM | POA: Diagnosis not present

## 2024-01-18 DIAGNOSIS — E1142 Type 2 diabetes mellitus with diabetic polyneuropathy: Secondary | ICD-10-CM | POA: Diagnosis not present

## 2024-01-18 DIAGNOSIS — M79676 Pain in unspecified toe(s): Secondary | ICD-10-CM | POA: Diagnosis not present

## 2024-01-18 DIAGNOSIS — B351 Tinea unguium: Secondary | ICD-10-CM | POA: Diagnosis not present

## 2024-01-19 DIAGNOSIS — R399 Unspecified symptoms and signs involving the genitourinary system: Secondary | ICD-10-CM | POA: Diagnosis not present

## 2024-01-19 DIAGNOSIS — D49511 Neoplasm of unspecified behavior of right kidney: Secondary | ICD-10-CM | POA: Diagnosis not present

## 2024-01-24 ENCOUNTER — Encounter: Payer: Self-pay | Admitting: Podiatry

## 2024-01-24 NOTE — Progress Notes (Signed)
  Subjective:  Patient ID: Jordan [name], male    DOB: [birth year],  MRN: [id number]  Jordan [name] presents to clinic today for at risk foot care with history of diabetic neuropathy and corn(s) right foot and painful mycotic toenails that are difficult to trim. Painful toenails interfere with ambulation. Aggravating factors include wearing enclosed shoe gear. Pain is relieved with periodic professional debridement. Painful corns are aggravated when weightbearing when wearing enclosed shoe gear. Pain is relieved with periodic professional debridement.  Chief Complaint  Patient presents with   Toe Pain    A1c 7.3 Dr. Glenard is his PCP. He has an appointment next week.   New problem(s): None.   PCP is Sowles, Krichna, MD.  Allergies  Allergen Reactions   Ace Inhibitors Other (See Comments)    angioedema    Review of Systems: Negative except as noted in the HPI.  Objective: No changes noted in today's physical examination. There were no vitals filed for this visit. Jordan [name] is a pleasant 83 y.o. male morbidly obese in NAD. AAO x 3.  Vascular Examination: CFT <4 seconds b/l. DP pulses diminished b/l. PT pulses diminished b/l. Digital hair absent. Skin temperature gradient warm to cool b/l. No ischemia or gangrene. No cyanosis or clubbing noted b/l.    Neurological Examination: Sensation grossly intact b/l with 10 gram monofilament.   Dermatological Examination: Pedal skin thin, shiny and atrophic b/l. No open wounds. No interdigital macerations.   Toenails 1-5 b/l thick, discolored, elongated with subungual debris and pain on dorsal palpation.   Hyperkeratotic lesion(s) medial PIPJ of right second digit.  No erythema, no edema, no drainage, no fluctuance.  Musculoskeletal Examination: Muscle strength 5/5 to all lower extremity muscle groups bilaterally. Hammertoe(s) 2-5 bilaterally.  Radiographs: None  Assessment/Plan: 1. 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 Corns   3. Diabetic polyneuropathy associated with type 2 diabetes mellitus (HCC)   Patient was evaluated and treated. All patient's and/or POA's questions/concerns addressed on today's visit. Toenails 1-5 b/l debrided in length and girth without incident. Corn(s) medial PIPJ of right second digit pared with sharp debridement without incident. Continue foot and shoe inspections daily. Monitor blood glucose per PCP/Endocrinologist's recommendations. Continue soft, supportive shoe gear daily. Report any pedal injuries to medical professional. Call office if there are any questions/concerns. -Patient/POA to call should there be question/concern in the interim.   Return in about 3 months (around 04/19/2024).  Jordan [name], DPM      [facility] LOCATION: [address], [place] [address]                   Office [phone]   [facility] LOCATION: [address] [place], [place] [address] Office [phone]

## 2024-01-26 IMAGING — CR DG CHEST 2V
2 series · 2 of 2 positions shown · non-contrast
Comparison: 05/10/2021 and older studies.

CLINICAL DATA: Pt states he has been wearing a heart monitor since
[DATE] and received a call today to come to ED so that his heart could
be monitored better. Denies chest pain, sob, dizziness or any other
complaints

Nonsmoker, diabetic Hx of aortic stenosis, heart murmur, HTN, S/P
TAVR
EXAM:
CHEST - 2 VIEW

[chest pa]
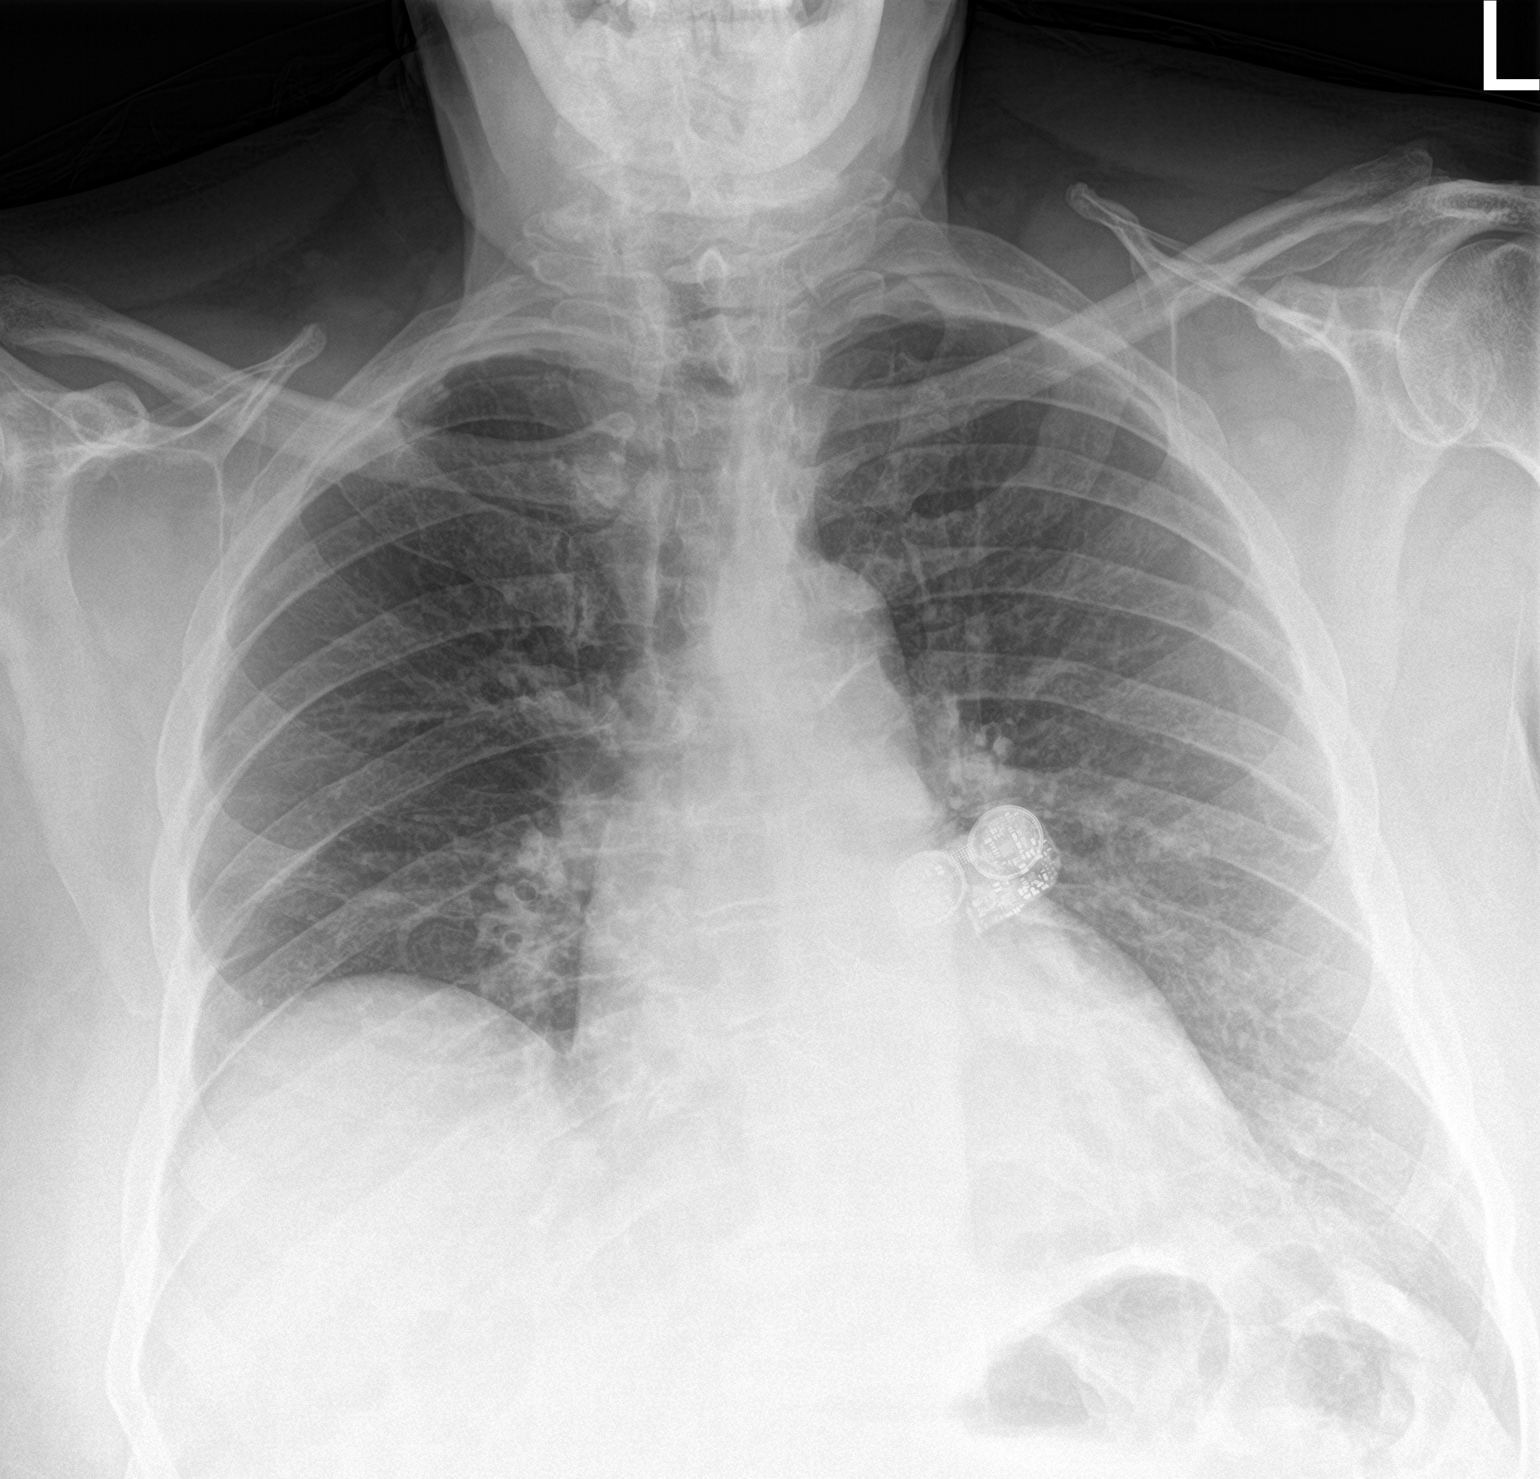

[chest lat]
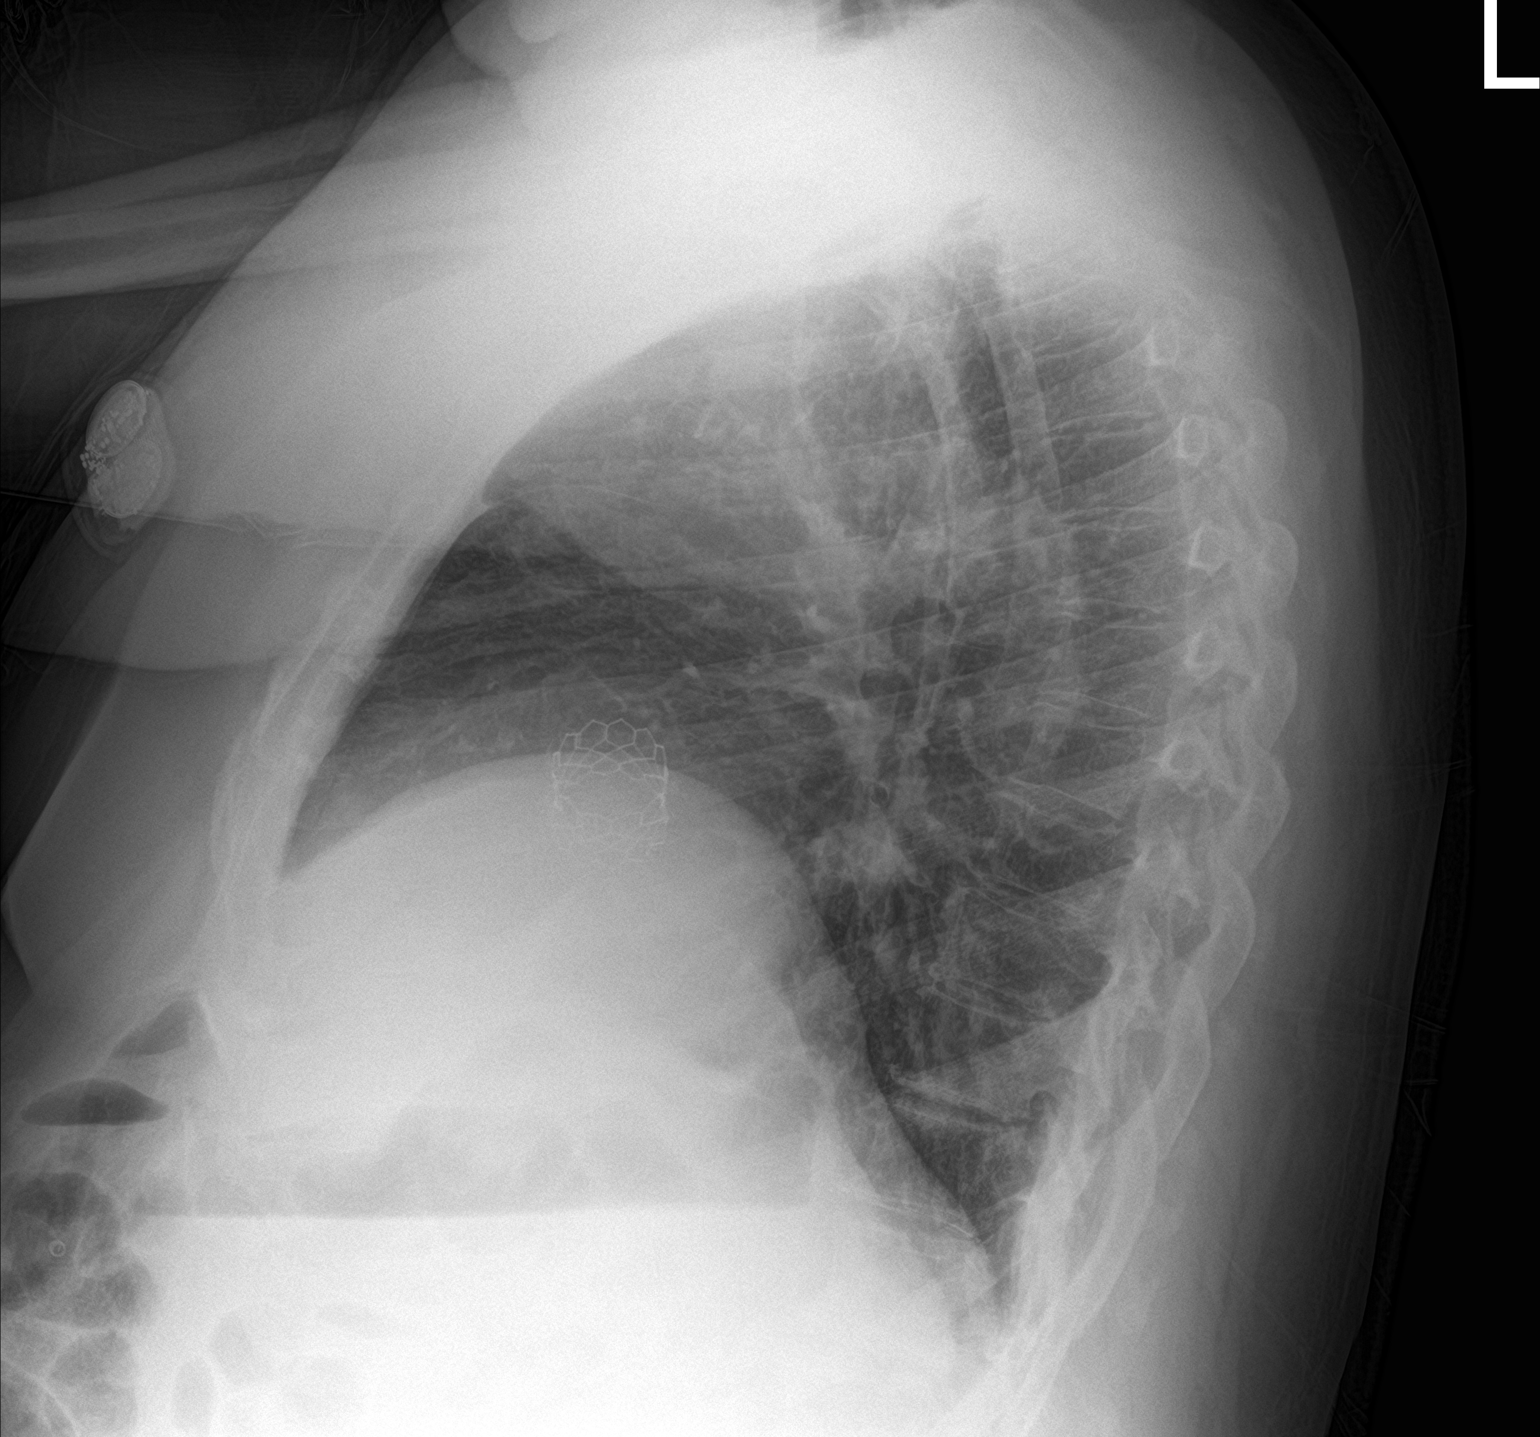

[2 of 2 positions shown; findings below may reference images not displayed]

FINDINGS: Cardiac silhouette is normal in size. Stable prosthetic aortic
valve. No mediastinal or hilar masses. No evidence of adenopathy.

Eventrated anterior right hemidiaphragm, stable.

Clear lungs.  No pleural effusion or pneumothorax.

Skeletal structures are intact.
IMPRESSION: No active cardiopulmonary disease.

## 2024-01-30 IMAGING — CR DG CHEST 2V
2 series · 2 of 2 positions shown · non-contrast
Comparison: 4 days ago

CLINICAL DATA: Pacer placement

EXAM:
CHEST - 2 VIEW

[chest lat]
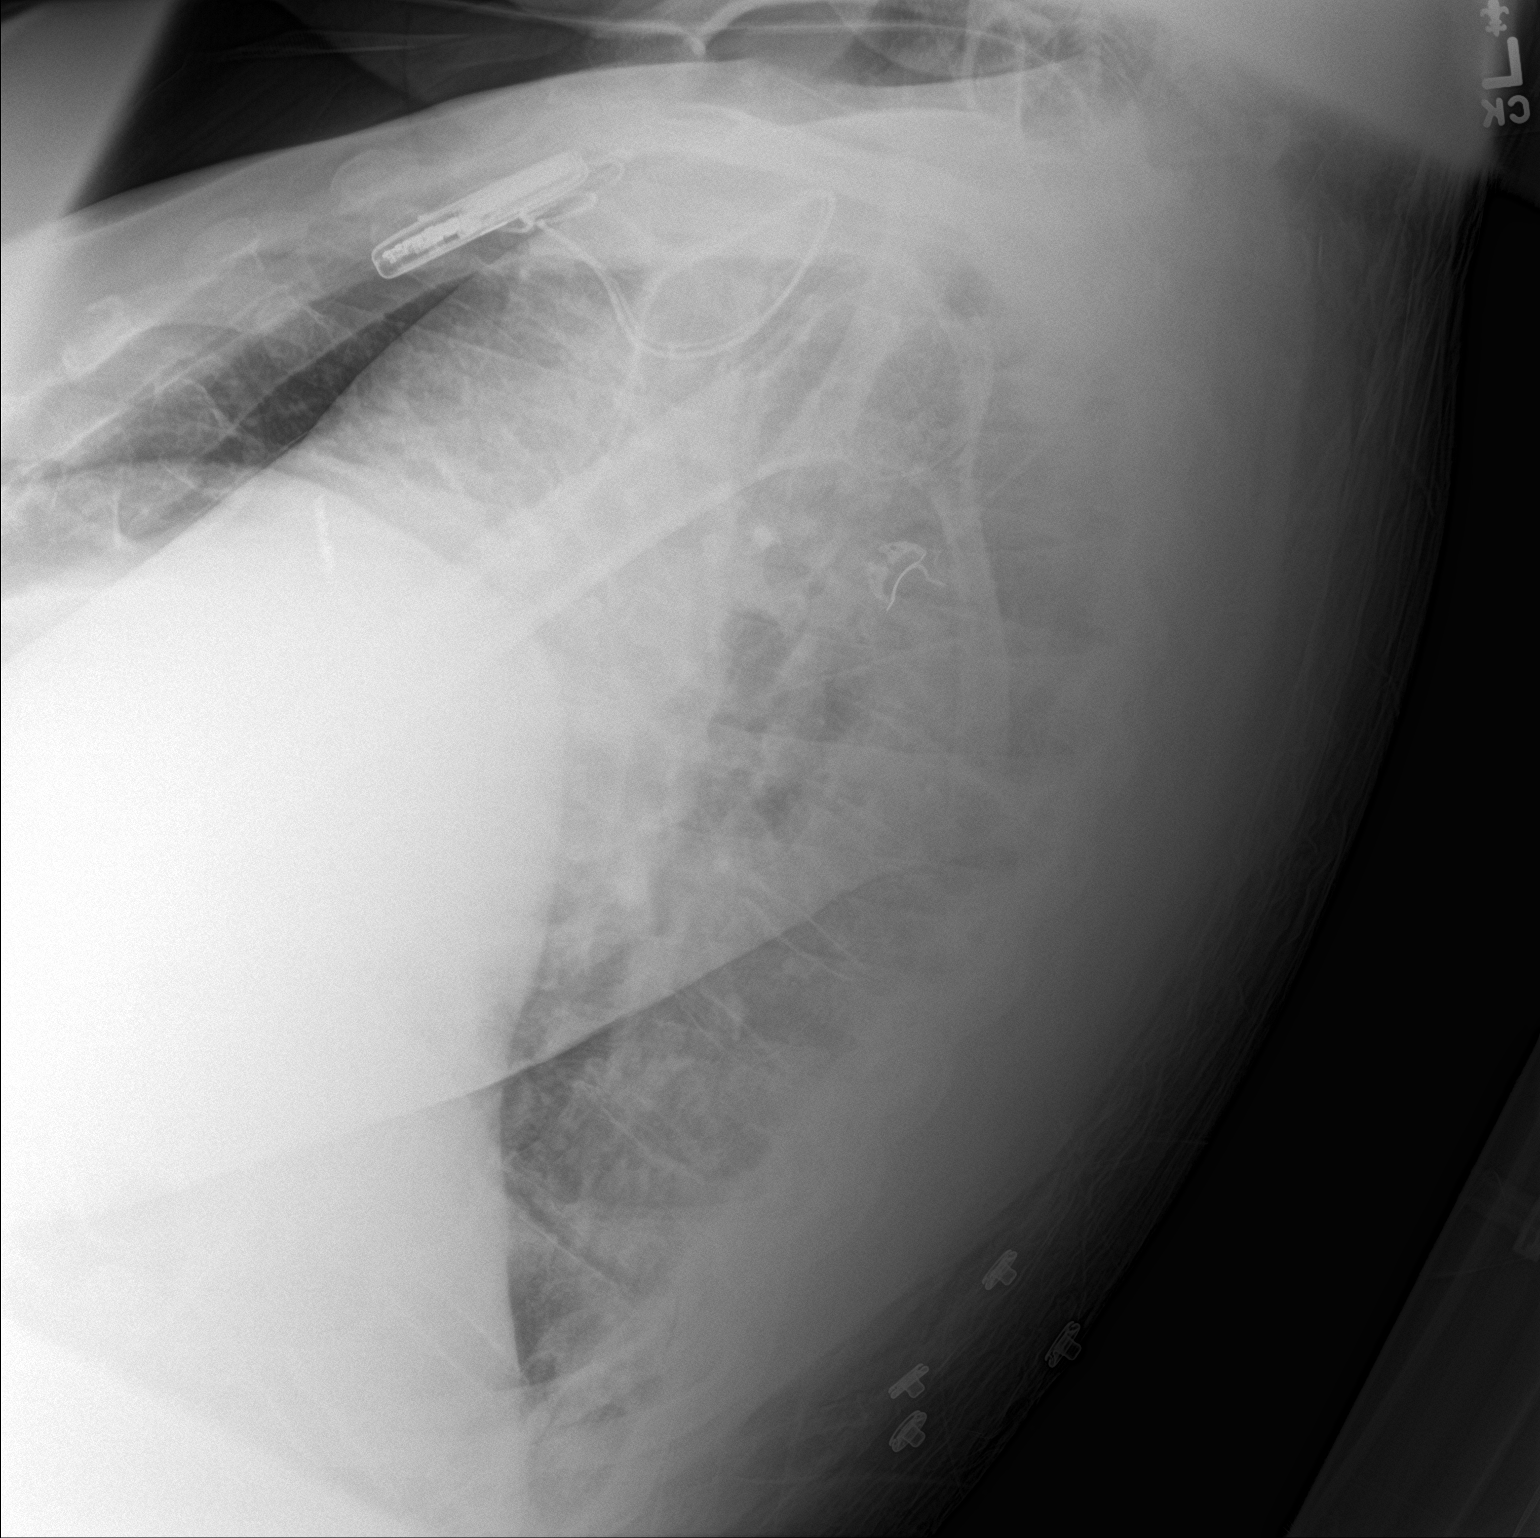

[chest ap]
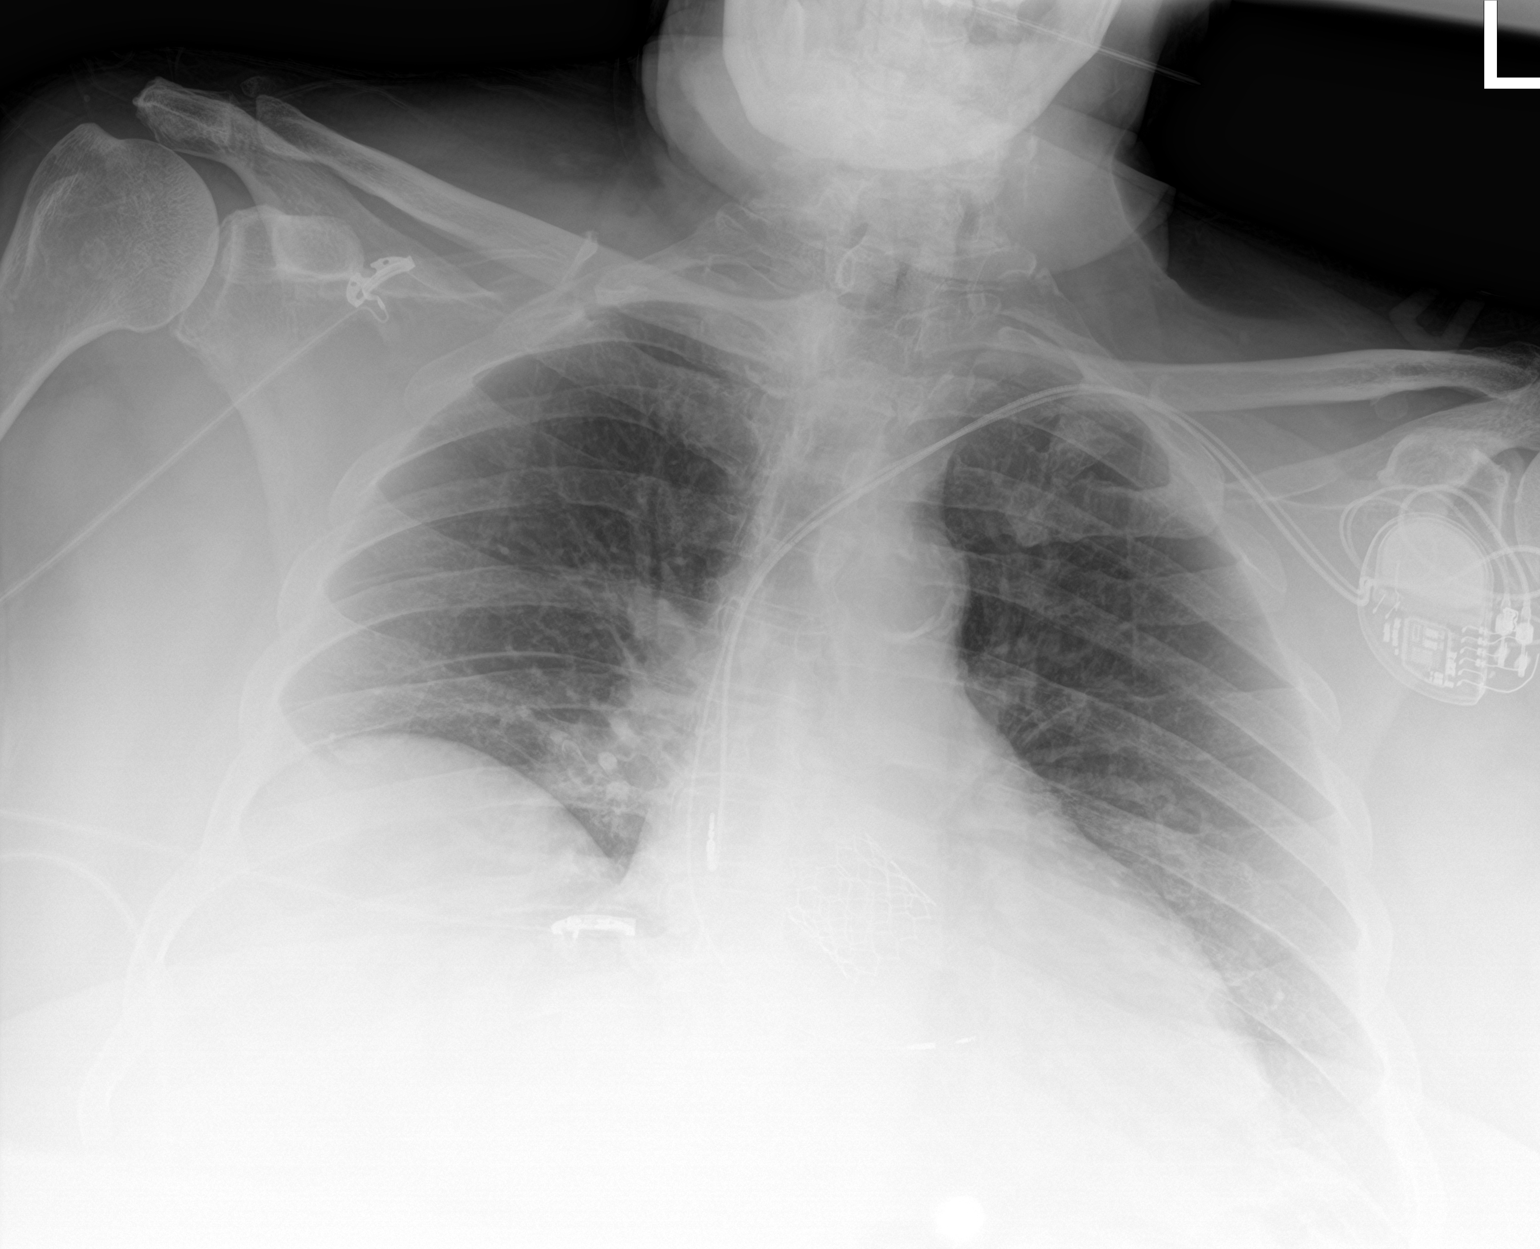

[2 of 2 positions shown; findings below may reference images not displayed]

FINDINGS: Dual-chamber pacer from the left with leads over the right atrial
appendage and right ventricle. Limited lateral view from necessary
arms down positioning. Stable heart size given differences in
technique. Transcatheter aortic valve replacement. No complicating
pneumothorax or pleural fluid. Low volume chest with right diaphragm
elevation that is chronic.
IMPRESSION: 1. No complicating features of dual-chamber pacer placement.
2. Low volume chest.

## 2024-02-09 ENCOUNTER — Telehealth: Payer: Self-pay

## 2024-02-09 NOTE — Telephone Encounter (Signed)
 Pharmacy Quality Measure Review  This patient is appearing on a report for being at risk of failing the  Kidney Health Evaluation for Patients with Diabetes measure this calendar year.   Last documented A1c on 10/15/23  Last documented UACR on 02/10/23  Appointment with PCP scheduled on 02/16/24. Will update appointment notes to obtain UACR at follow up.  Libbey Duce E. Marsh, PharmD, CPP Clinical Pharmacist [facility] [phone]

## 2024-02-15 ENCOUNTER — Other Ambulatory Visit: Payer: Self-pay | Admitting: Cardiovascular Disease

## 2024-02-15 ENCOUNTER — Other Ambulatory Visit: Payer: Self-pay | Admitting: Family Medicine

## 2024-02-15 DIAGNOSIS — I700 Atherosclerosis of aorta: Secondary | ICD-10-CM

## 2024-02-15 DIAGNOSIS — E782 Mixed hyperlipidemia: Secondary | ICD-10-CM

## 2024-02-16 ENCOUNTER — Encounter: Payer: Self-pay | Admitting: Family Medicine

## 2024-02-16 ENCOUNTER — Ambulatory Visit: Admitting: Family Medicine

## 2024-02-16 VITALS — BP 124/76 | HR 96 | Resp 16 | Ht 69.0 in | Wt 309.2 lb

## 2024-02-16 DIAGNOSIS — E1159 Type 2 diabetes mellitus with other circulatory complications: Secondary | ICD-10-CM

## 2024-02-16 DIAGNOSIS — E1169 Type 2 diabetes mellitus with other specified complication: Secondary | ICD-10-CM

## 2024-02-16 DIAGNOSIS — Z952 Presence of prosthetic heart valve: Secondary | ICD-10-CM

## 2024-02-16 DIAGNOSIS — I77810 Thoracic aortic ectasia: Secondary | ICD-10-CM

## 2024-02-16 DIAGNOSIS — I700 Atherosclerosis of aorta: Secondary | ICD-10-CM | POA: Diagnosis not present

## 2024-02-16 DIAGNOSIS — N401 Enlarged prostate with lower urinary tract symptoms: Secondary | ICD-10-CM

## 2024-02-16 DIAGNOSIS — R809 Proteinuria, unspecified: Secondary | ICD-10-CM | POA: Diagnosis not present

## 2024-02-16 DIAGNOSIS — J410 Simple chronic bronchitis: Secondary | ICD-10-CM

## 2024-02-16 DIAGNOSIS — Z862 Personal history of diseases of the blood and blood-forming organs and certain disorders involving the immune mechanism: Secondary | ICD-10-CM | POA: Diagnosis not present

## 2024-02-16 DIAGNOSIS — E1129 Type 2 diabetes mellitus with other diabetic kidney complication: Secondary | ICD-10-CM | POA: Diagnosis not present

## 2024-02-16 DIAGNOSIS — E119 Type 2 diabetes mellitus without complications: Secondary | ICD-10-CM

## 2024-02-16 DIAGNOSIS — Z95811 Presence of heart assist device: Secondary | ICD-10-CM | POA: Insufficient documentation

## 2024-02-16 DIAGNOSIS — M109 Gout, unspecified: Secondary | ICD-10-CM

## 2024-02-16 MED ORDER — ATORVASTATIN CALCIUM 80 MG PO TABS: 80.0000 mg | ORAL_TABLET | Freq: Every day | ORAL | 3 refills | Status: AC

## 2024-02-16 MED ORDER — AMLODIPINE BESYLATE 5 MG PO TABS: 5.0000 mg | ORAL_TABLET | Freq: Every day | ORAL | 1 refills | Status: AC

## 2024-02-16 MED ORDER — GLIPIZIDE ER 5 MG PO TB24: 5.0000 mg | ORAL_TABLET | Freq: Every day | ORAL | 1 refills | Status: AC

## 2024-02-16 MED ORDER — ALLOPURINOL 300 MG PO TABS: 300.0000 mg | ORAL_TABLET | Freq: Every day | ORAL | 3 refills | Status: AC

## 2024-02-16 MED ORDER — METFORMIN HCL ER 750 MG PO TB24: 1500.0000 mg | ORAL_TABLET | Freq: Every day | ORAL | 1 refills | Status: AC

## 2024-02-16 MED ORDER — PIOGLITAZONE HCL 15 MG PO TABS: 15.0000 mg | ORAL_TABLET | Freq: Every day | ORAL | 1 refills | Status: AC

## 2024-02-16 NOTE — Progress Notes (Signed)
 Name: Jordan [name]   MRN: [id number]    DOB: 07-Jun-1940   Date:02/16/2024       Progress Note  Subjective  Chief Complaint  Chief Complaint  Patient presents with   Medical Management of Chronic Issues   Discussed the use of AI scribe software for clinical note transcription with the patient, who gave verbal consent to proceed.  History of Present Illness Jordan [name] is an 83 year old male who presents for routine follow-up.  He has a history of iron deficiency anemia and hemorrhoids, and had  banding in the past. No current rectal bleeding is reported. His ferritin levels have decreased from 61 to 37, and he continues to take iron supplements.   His type 2 diabetes is managed with pioglitazone  15 mg, glipizide  XL  5 mg in the morning, and metformin  750 mg twice daily. His last A1c was 7.3%. He reports increased urination but no excessive hunger or thirst. He has lost 5 pounds since July and is working on portion control and reducing carbohydrate intake.  His gout is controlled, and he has not experienced any recent gout attacks. He takes allopurinol  300 mg daily.  He reports seeing a urologist for urinary symptoms and has enlarged prostate with LUTS He takes Cialis  20 mg but no other medications like Flomax . He reports increased urination frequency without burning.  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 He takes aspirin  and does not recall taking blood thinners. 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 He brings up phlegm in the morning but denies wheezing and does not use inhalers and is not interested in starting it at this time  His hypertension is managed with amlodipine  5 mg and metoprolol  25 mg. He experienced dizziness previously, leading to a reduction in medication  dosage. His blood pressure is currently well-controlled.  He has a history of obesity with a BMI over 40 and is working on weight loss through dietary changes.    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   PAD (peripheral artery disease) 01/17/2020   Diverticulosis of colon with hemorrhage 05/26/2019   Atherosclerosis of aorta 04/26/2019   Rectal bleed 04/22/2019   Chronic bronchitis (HCC) 07/30/2018   Bilateral hydrocele 11/27/2015   Ventral hernia without obstruction or gangrene 11/27/2015   Controlled type 2 diabetes mellitus with microalbuminuria (HCC) 10/26/2015   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   Hyperlipidemia 11/07/2014   Essential hypertension 11/07/2014   Obesity, Class III, BMI 40-49.9 (morbid obesity) (HCC) 11/07/2014   Controlled gout 11/07/2014   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   Constipation 10/26/2012   Incisional hernia, without obstruction or gangrene 10/26/2012   Left inguinal hernia     Past Surgical  History:  Procedure Laterality Date   cataract surgery     COLONOSCOPY  03/17/2010   COLONOSCOPY N/A 04/22/2019   Procedure: COLONOSCOPY;  Surgeon: Mansouraty, Aloha Raddle., MD;  Location: [facility];  Service: Gastroenterology;  Laterality: N/A;   EYE SURGERY  03/17/2009   cataract   HERNIA REPAIR      INTRAOPERATIVE TRANSTHORACIC ECHOCARDIOGRAM N/A 05/14/2021   Procedure: INTRAOPERATIVE TRANSTHORACIC ECHOCARDIOGRAM;  Surgeon: Wonda Sharper, MD;  Location: [facility] INVASIVE CV LAB;  Service: Open Heart Surgery;  Laterality: N/A;   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   Procedure: PACEMAKER IMPLANT;  Surgeon: Waddell Danelle ORN, MD;  Location: [facility] INVASIVE CV LAB;  Service: Cardiovascular;  Laterality: N/A;   RIGHT HEART CATH AND CORONARY ANGIOGRAPHY Bilateral 02/15/2021   Procedure: RIGHT HEART CATH AND CORONARY ANGIOGRAPHY;  Surgeon: Mady Bruckner, MD;  Location: [facility] INVASIVE CV LAB;  Service: Cardiovascular;  Laterality: Bilateral;   TRANSCATHETER AORTIC VALVE REPLACEMENT, TRANSFEMORAL N/A 05/14/2021   Procedure: Transcatheter Aortic Valve Replacement, Transfemoral;  Surgeon: Wonda Sharper, MD;  Location: [facility] INVASIVE CV LAB;  Service: Open Heart Surgery;  Laterality: N/A;   UMBILICAL HERNIA REPAIR  03/17/2009    Family History  Problem Relation Age of Onset   Heart disease Father    Hypertension Father    Diabetes Sister    Diabetes Brother    Prostate cancer Brother    Diabetes Sister    Kidney disease Neg Hx    Kidney cancer Neg Hx    Bladder Cancer Neg Hx    Colon cancer Neg Hx    Esophageal cancer Neg Hx    Inflammatory bowel disease Neg Hx    Liver disease Neg Hx    Pancreatic cancer Neg Hx    Rectal cancer Neg Hx    Stomach cancer Neg Hx     Social History   Tobacco Use   Smoking status: Former    Current packs/day: 0.00    Average packs/day: 1 pack/day for 10.7 years (10.7 ttl pk-yrs)    Types: Cigarettes    Start date: 03/17/1965    Quit date: 11/27/1975    Years since quitting: 48.2   Smokeless tobacco: Never   Tobacco comments:    quit 40 years  Substance Use Topics   Alcohol use: Not Currently    Alcohol/week: 1.0 standard drink of alcohol    Types: 1 Standard drinks or equivalent per week    Comment: socially - 1 x year     Current Outpatient Medications:     acetaminophen  (TYLENOL ) 325 MG tablet, Take 2 tablets (650 mg total) by mouth every 4 (four) hours as needed for headache or mild pain., Disp: , Rfl:    Alcohol Swabs (B-D SINGLE USE SWABS REGULAR) PADS, 1 each by Does not apply route as needed., Disp: 100 each, Rfl: 3   allopurinol  (ZYLOPRIM ) 300 MG tablet, Take 1 tablet (300 mg total) by mouth daily., Disp: 90 tablet, Rfl: 3   amLODipine  (NORVASC ) 5 MG tablet, Take 1 tablet (5 mg total) by mouth daily., Disp: 90 tablet, Rfl: 1   aspirin  EC 81 MG tablet, Take 1 tablet (81 mg total) by mouth daily. Swallow whole., Disp: , Rfl:    atorvastatin  (LIPITOR) 80 MG tablet, Take 1 tablet (80 mg total) by mouth at bedtime., Disp: 90 tablet, Rfl: 3   Cholecalciferol (VITAMIN D3 PO), Take 1 tablet by mouth every Monday, Wednesday, and Friday., Disp: , Rfl:    ferrous gluconate  (FERGON) 240 (27 FE) MG  tablet, Take 240 mg by mouth 3 (three) times daily with meals., Disp: , Rfl:    furosemide  (LASIX ) 20 MG tablet, TAKE 1 TABLET EVERY DAY, Disp: 90 tablet, Rfl: 3   glipiZIDE  (GLUCOTROL  XL) 5 MG 24 hr tablet, Take 1 tablet (5 mg total) by mouth daily with breakfast., Disp: 90 tablet, Rfl: 1   latanoprost  (XALATAN ) 0.005 % ophthalmic solution, Place 1 drop into the left eye at bedtime. , Disp: , Rfl:    metFORMIN  (GLUCOPHAGE -XR) 750 MG 24 hr tablet, Take 2 tablets (1,500 mg total) by mouth daily with breakfast., Disp: 180 tablet, Rfl: 1   metoprolol  succinate (TOPROL  XL) 25 MG 24 hr tablet, Take 1 tablet (25 mg total) by mouth daily., Disp: 90 tablet, Rfl: 3   Omega-3 Fatty Acids (FISH OIL PO), Take 1 capsule by mouth 2 (two) times daily. , Disp: , Rfl:    pioglitazone  (ACTOS ) 15 MG tablet, Take 1 tablet (15 mg total) by mouth daily., Disp: 90 tablet, Rfl: 1   polyethylene glycol (MIRALAX  / GLYCOLAX ) packet, Take 17 g by mouth at bedtime. , Disp: , Rfl:    tadalafil  (CIALIS ) 20 MG tablet, Take 1 tablet (20 mg total) by mouth daily as needed for erectile dysfunction.,  Disp: 30 tablet, Rfl: 3   TRUE METRIX BLOOD GLUCOSE TEST test strip, CHECK BLOOD SUGAR AS DIRECTED, Disp: 200 strip, Rfl: 3   TRUEplus Lancets 33G MISC, CHECK BLOOD GLUCOSE TWO TIMES DAILY, Disp: 200 each, Rfl: 3   Blood Glucose Calibration (TRUE METRIX LEVEL 1) Low SOLN, 1 Bottle by In Vitro route once for 1 dose., Disp: 1 each, Rfl: 0   Blood Glucose Monitoring Suppl (TRUE METRIX AIR GLUCOSE METER) DEVI, 1 each by Does not apply route once for 1 dose., Disp: 1 each, Rfl: 0  Allergies  Allergen Reactions   Ace Inhibitors Other (See Comments)    angioedema    I personally reviewed active problem list, medication list, allergies, family history with the patient/caregiver today.   ROS  Ten systems reviewed and is negative except as mentioned in HPI    Objective Physical Exam  CONSTITUTIONAL: Patient appears well-developed and well-nourished.  No distress. HEENT: Head atraumatic, normocephalic, neck supple. CARDIOVASCULAR: Normal rate, regular rhythm and normal heart sounds.  No murmur heard. No BLE edema. PULMONARY: Effort normal and breath sounds normal. No respiratory distress. ABDOMINAL: There is no tenderness or distention. MUSCULOSKELETAL: Slow gait. Without gross motor or sensory deficit. PSYCHIATRIC: Patient has a normal mood and affect. behavior is normal. Judgment and thought content normal.  Vitals:   02/16/24 1352  BP: 124/76  Pulse: 96  Resp: 16  SpO2: 98%  Weight: (!) 309 lb 3.2 oz (140.3 kg)  Height: 5' 9 (1.753 m)    Body mass index is 45.66 kg/m.    PHQ2/9:    02/16/2024    1:44 PM 10/15/2023    2:26 PM 06/16/2023    1:36 PM 03/26/2023    2:36 PM 02/10/2023    2:41 PM  Depression screen PHQ 2/9  Decreased Interest 0 0 0 0 0  Down, Depressed, Hopeless 0 0 0 0 0  PHQ - 2 Score 0 0 0 0 0  Altered sleeping   0 0 0  Tired, decreased energy   0 0 0  Change in appetite   0 0 0  Feeling bad or failure about yourself    0 0 0  Trouble concentrating   0 0 0  Moving slowly or fidgety/restless   0 0 0  Suicidal thoughts   0 0 0  PHQ-9 Score   0  0  0   Difficult doing work/chores   Not difficult at all       Data saved with a previous flowsheet row definition    phq 9 is negative  Fall Risk:    02/16/2024    1:44 PM 10/15/2023    2:26 PM 06/16/2023    1:36 PM 03/26/2023    2:39 PM 02/10/2023    2:41 PM  Fall Risk   Falls in the past year? 0 0 0 0 0  Number falls in past yr: 0 0 0 0 0  Injury with Fall? 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 Last A1c 7.3%, suboptimal control. Reports increased urination, possibly related to BPH. Weight loss of 5 pounds since July suggests improved diabetes management. - Ordered A1c and microalbuminuria tests. - Continue pioglitazone , glipizide , and metformin . - Encouraged continued weight loss and dietary modifications.  Morbid Obesity BMI over 40, indicating morbid obesity. Lost 5 pounds since July with portion control and dietary changes. - Encouraged continued weight loss and dietary modifications.  Benign prostatic hyperplasia with lower urinary tract symptoms BPH with lower urinary tract symptoms since 2016. Currently on Cialis  20 mg. - Discuss with urologist about potential addition of Flomax .  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 On atorvastatin  for dyslipidemia. - Continue  atorvastatin .  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 Underwent TAVR in February 2023. On aspirin , no anticoagulation therapy. - Continue aspirin . - Will repeat echocardiogram next year per cardiologist note  Gout Well-controlled with allopurinol  300 mg daily. - Continue allopurinol  300 mg daily.  Simple chronic bronchitis Chronic bronchitis with morning phlegm production. Declined inhaler therapy. - Continue current management without inhalers.

## 2024-02-17 ENCOUNTER — Ambulatory Visit: Payer: Self-pay | Admitting: Family Medicine

## 2024-02-17 ENCOUNTER — Ambulatory Visit: Payer: Medicare HMO

## 2024-02-17 DIAGNOSIS — I472 Ventricular tachycardia, unspecified: Secondary | ICD-10-CM | POA: Diagnosis not present

## 2024-02-17 LAB — IRON,TIBC AND FERRITIN PANEL
%SAT: 18 % — ABNORMAL LOW (ref 20–48)
Ferritin: 95 ng/mL (ref 24–380)
Iron: 57 ug/dL (ref 50–180)
TIBC: 319 ug/dL (ref 250–425)

## 2024-02-17 LAB — CBC WITH DIFFERENTIAL/PLATELET
Absolute Lymphocytes: 1159 {cells}/uL (ref 850–3900)
Absolute Monocytes: 626 {cells}/uL (ref 200–950)
Basophils Absolute: 29 {cells}/uL (ref 0–200)
Basophils Relative: 0.4 %
Eosinophils Absolute: 43 {cells}/uL (ref 15–500)
Eosinophils Relative: 0.6 %
HCT: 38.5 % — ABNORMAL LOW (ref 39.4–51.1)
Hemoglobin: 12.7 g/dL — ABNORMAL LOW (ref 13.2–17.1)
MCH: 29.8 pg (ref 27.0–33.0)
MCHC: 33 g/dL (ref 31.6–35.4)
MCV: 90.4 fL (ref 81.4–101.7)
MPV: 12.1 fL (ref 7.5–12.5)
Monocytes Relative: 8.7 %
Neutro Abs: 5342 {cells}/uL (ref 1500–7800)
Neutrophils Relative %: 74.2 %
Platelets: 202 Thousand/uL (ref 140–400)
RBC: 4.26 Million/uL (ref 4.20–5.80)
RDW: 16.3 % — ABNORMAL HIGH (ref 11.0–15.0)
Total Lymphocyte: 16.1 %
WBC: 7.2 Thousand/uL (ref 3.8–10.8)

## 2024-02-17 LAB — LIPID PANEL
Cholesterol: 108 mg/dL (ref <200)
HDL: 42 mg/dL (ref >=40)
LDL Cholesterol (Calc): 52 mg/dL
Non-HDL Cholesterol (Calc): 66 mg/dL (ref <130)
Total CHOL/HDL Ratio: 2.6 (calc) (ref <5.0)
Triglycerides: 61 mg/dL (ref <150)

## 2024-02-17 LAB — COMPREHENSIVE METABOLIC PANEL WITH GFR
AG Ratio: 1 (calc) (ref 1.0–2.5)
ALT: 14 U/L (ref 9–46)
AST: 24 U/L (ref 10–35)
Albumin: 4 g/dL (ref 3.6–5.1)
Alkaline phosphatase (APISO): 51 U/L (ref 35–144)
BUN: 20 mg/dL (ref 7–25)
CO2: 26 mmol/L (ref 20–32)
Calcium: 9.6 mg/dL (ref 8.6–10.3)
Chloride: 100 mmol/L (ref 98–110)
Creat: 1.07 mg/dL (ref 0.70–1.22)
Globulin: 4 g/dL — ABNORMAL HIGH (ref 1.9–3.7)
Glucose, Bld: 134 mg/dL — ABNORMAL HIGH (ref 65–99)
Potassium: 5.2 mmol/L (ref 3.5–5.3)
Sodium: 135 mmol/L (ref 135–146)
Total Bilirubin: 0.7 mg/dL (ref 0.2–1.2)
Total Protein: 8 g/dL (ref 6.1–8.1)
eGFR: 69 mL/min/1.73m2 (ref >=60)

## 2024-02-17 LAB — HEMOGLOBIN A1C
Hgb A1c MFr Bld: 7.9 % — ABNORMAL HIGH (ref <5.7)
Mean Plasma Glucose: 180 mg/dL
eAG (mmol/L): 10 mmol/L

## 2024-02-17 LAB — MICROALBUMIN / CREATININE URINE RATIO
Creatinine, Urine: 146 mg/dL (ref 20–320)
Microalb Creat Ratio: 27 mg/g{creat} (ref <30)
Microalb, Ur: 4 mg/dL

## 2024-02-18 DIAGNOSIS — M3501 Sicca syndrome with keratoconjunctivitis: Secondary | ICD-10-CM | POA: Diagnosis not present

## 2024-02-18 DIAGNOSIS — Z961 Presence of intraocular lens: Secondary | ICD-10-CM | POA: Diagnosis not present

## 2024-02-18 DIAGNOSIS — E119 Type 2 diabetes mellitus without complications: Secondary | ICD-10-CM | POA: Diagnosis not present

## 2024-02-18 LAB — CUP PACEART REMOTE DEVICE CHECK
Battery Remaining Longevity: 150 mo
Battery Voltage: 3.04 V
Brady Statistic AP VP Percent: 0.01 %
Brady Statistic AP VS Percent: 5.17 %
Brady Statistic AS VP Percent: 0.03 %
Brady Statistic AS VS Percent: 94.79 %
Brady Statistic RA Percent Paced: 5.29 %
Brady Statistic RV Percent Paced: 0.04 %
Date Time Interrogation Session: 20251202233918
Implantable Lead Connection Status: 753985
Implantable Lead Connection Status: 753985
Implantable Lead Implant Date: 20230307
Implantable Lead Implant Date: 20230307
Implantable Lead Location: 753859
Implantable Lead Location: 753860
Implantable Lead Model: 3830
Implantable Lead Model: 5076
Implantable Pulse Generator Implant Date: 20230307
Lead Channel Impedance Value: 342 Ohm
Lead Channel Impedance Value: 399 Ohm
Lead Channel Impedance Value: 418 Ohm
Lead Channel Impedance Value: 570 Ohm
Lead Channel Pacing Threshold Amplitude: 0.5 V
Lead Channel Pacing Threshold Amplitude: 0.875 V
Lead Channel Pacing Threshold Pulse Width: 0.4 ms
Lead Channel Pacing Threshold Pulse Width: 0.4 ms
Lead Channel Sensing Intrinsic Amplitude: 16.25 mV
Lead Channel Sensing Intrinsic Amplitude: 16.25 mV
Lead Channel Sensing Intrinsic Amplitude: 4 mV
Lead Channel Sensing Intrinsic Amplitude: 4 mV
Lead Channel Setting Pacing Amplitude: 1.5 V
Lead Channel Setting Pacing Amplitude: 2 V
Lead Channel Setting Pacing Pulse Width: 0.4 ms
Lead Channel Setting Sensing Sensitivity: 1.2 mV
Zone Setting Status: 755011
Zone Setting Status: 755011

## 2024-02-19 ENCOUNTER — Other Ambulatory Visit: Payer: Self-pay | Admitting: Family Medicine

## 2024-02-19 DIAGNOSIS — M3501 Sicca syndrome with keratoconjunctivitis: Secondary | ICD-10-CM | POA: Diagnosis not present

## 2024-02-19 DIAGNOSIS — Z961 Presence of intraocular lens: Secondary | ICD-10-CM | POA: Diagnosis not present

## 2024-02-19 DIAGNOSIS — E119 Type 2 diabetes mellitus without complications: Secondary | ICD-10-CM | POA: Diagnosis not present

## 2024-02-19 MED ORDER — GLIPIZIDE ER 2.5 MG PO TB24: 2.5000 mg | ORAL_TABLET | Freq: Every day | ORAL | 1 refills | Status: AC

## 2024-02-21 ENCOUNTER — Ambulatory Visit: Payer: Self-pay | Admitting: Cardiology

## 2024-02-24 NOTE — Progress Notes (Signed)
 Remote PPM Transmission

## 2024-04-15 ENCOUNTER — Telehealth: Payer: Self-pay

## 2024-04-15 NOTE — Progress Notes (Signed)
 Pharmacy Quality Measure Review  This patient is appearing on a report for being at risk of failing the adherence measure for cholesterol (statin) medications this calendar year.   Medication: atorvastatin  Last fill date: 03/10/24 for 90 day supply  Insurance report was not up to date. No action needed at this time.   Konstantin Lehnen E. Marsh, PharmD, BCACP, CPP Clinical Pharmacist [facility] [phone]

## 2024-05-09 ENCOUNTER — Ambulatory Visit: Admitting: Podiatry

## 2024-05-18 ENCOUNTER — Ambulatory Visit

## 2024-06-17 ENCOUNTER — Ambulatory Visit: Admitting: Family Medicine

## 2024-07-15 ENCOUNTER — Ambulatory Visit

## 2024-08-17 ENCOUNTER — Ambulatory Visit

## 2024-11-16 ENCOUNTER — Ambulatory Visit

## 2025-02-15 ENCOUNTER — Ambulatory Visit
